# Patient Record
Sex: Male | Born: 1950 | Race: White | Hispanic: No | Marital: Married | Smoking: Former smoker
Health system: Southern US, Community
[De-identification: ages and names within clinical notes are randomized; demographics above are authoritative.]

## PROBLEM LIST (undated history)

## (undated) DIAGNOSIS — M1990 Unspecified osteoarthritis, unspecified site: Secondary | ICD-10-CM

## (undated) DIAGNOSIS — G8929 Other chronic pain: Secondary | ICD-10-CM

## (undated) DIAGNOSIS — M549 Dorsalgia, unspecified: Secondary | ICD-10-CM

## (undated) DIAGNOSIS — G629 Polyneuropathy, unspecified: Secondary | ICD-10-CM

## (undated) DIAGNOSIS — I10 Essential (primary) hypertension: Secondary | ICD-10-CM

## (undated) DIAGNOSIS — E119 Type 2 diabetes mellitus without complications: Secondary | ICD-10-CM

## (undated) HISTORY — PX: PAIN PUMP REVISION: SHX6231

---

## 2004-01-25 ENCOUNTER — Other Ambulatory Visit: Payer: Self-pay

## 2004-05-12 ENCOUNTER — Ambulatory Visit: Payer: Self-pay | Admitting: Gastroenterology

## 2004-06-27 ENCOUNTER — Encounter: Payer: Self-pay | Admitting: Family Medicine

## 2004-07-03 ENCOUNTER — Emergency Department: Payer: Self-pay | Admitting: Emergency Medicine

## 2005-07-30 ENCOUNTER — Ambulatory Visit: Payer: Self-pay | Admitting: Family Medicine

## 2006-10-03 ENCOUNTER — Inpatient Hospital Stay: Payer: Self-pay

## 2006-10-06 ENCOUNTER — Other Ambulatory Visit: Payer: Self-pay

## 2006-10-23 ENCOUNTER — Ambulatory Visit: Payer: Self-pay | Admitting: Family Medicine

## 2007-02-21 ENCOUNTER — Other Ambulatory Visit: Payer: Self-pay

## 2007-02-21 ENCOUNTER — Inpatient Hospital Stay: Payer: Self-pay | Admitting: Psychiatry

## 2007-02-25 ENCOUNTER — Ambulatory Visit: Payer: Self-pay | Admitting: Unknown Physician Specialty

## 2007-02-27 ENCOUNTER — Ambulatory Visit: Payer: Self-pay | Admitting: Unknown Physician Specialty

## 2007-03-29 ENCOUNTER — Ambulatory Visit: Payer: Self-pay | Admitting: Unknown Physician Specialty

## 2007-04-28 ENCOUNTER — Ambulatory Visit: Payer: Self-pay | Admitting: Unknown Physician Specialty

## 2008-01-13 ENCOUNTER — Ambulatory Visit: Payer: Self-pay | Admitting: Podiatry

## 2008-10-13 ENCOUNTER — Inpatient Hospital Stay: Payer: Self-pay | Admitting: Internal Medicine

## 2009-03-17 ENCOUNTER — Inpatient Hospital Stay: Payer: Self-pay | Admitting: Student

## 2009-07-11 ENCOUNTER — Emergency Department: Payer: Self-pay

## 2009-07-17 ENCOUNTER — Inpatient Hospital Stay: Payer: Self-pay | Admitting: *Deleted

## 2010-03-22 ENCOUNTER — Other Ambulatory Visit: Payer: Self-pay | Admitting: Specialist

## 2011-03-05 DIAGNOSIS — K7581 Nonalcoholic steatohepatitis (NASH): Secondary | ICD-10-CM | POA: Insufficient documentation

## 2011-03-27 DIAGNOSIS — Z8679 Personal history of other diseases of the circulatory system: Secondary | ICD-10-CM | POA: Insufficient documentation

## 2011-03-27 DIAGNOSIS — I480 Paroxysmal atrial fibrillation: Secondary | ICD-10-CM | POA: Insufficient documentation

## 2011-06-20 ENCOUNTER — Ambulatory Visit: Payer: Self-pay | Admitting: Physical Medicine and Rehabilitation

## 2011-07-03 DIAGNOSIS — Z95810 Presence of automatic (implantable) cardiac defibrillator: Secondary | ICD-10-CM | POA: Insufficient documentation

## 2012-08-07 ENCOUNTER — Inpatient Hospital Stay: Payer: Self-pay | Admitting: Internal Medicine

## 2012-08-07 LAB — COMPREHENSIVE METABOLIC PANEL
Albumin: 3.4 g/dL (ref 3.4–5.0)
Alkaline Phosphatase: 40 U/L — ABNORMAL LOW (ref 50–136)
Anion Gap: 3 — ABNORMAL LOW (ref 7–16)
Bilirubin,Total: 0.5 mg/dL (ref 0.2–1.0)
Chloride: 99 mmol/L (ref 98–107)
Co2: 28 mmol/L (ref 21–32)
Creatinine: 1.09 mg/dL (ref 0.60–1.30)
EGFR (African American): >60
EGFR (Non-African Amer.): >60
Osmolality: 265 (ref 275–301)
SGOT(AST): 23 U/L (ref 15–37)
SGPT (ALT): 21 U/L (ref 12–78)
Sodium: 130 mmol/L — ABNORMAL LOW (ref 136–145)

## 2012-08-07 LAB — TROPONIN I: Troponin-I: 0.24 ng/mL — ABNORMAL HIGH

## 2012-08-07 LAB — CK TOTAL AND CKMB (NOT AT ARMC)
CK, Total: 41 U/L (ref 35–232)
CK, Total: 46 U/L (ref 35–232)
CK-MB: 1.3 ng/mL (ref 0.5–3.6)

## 2012-08-07 LAB — URINALYSIS, COMPLETE
Bilirubin,UR: NEGATIVE
Glucose,UR: NEGATIVE mg/dL (ref 0–75)
Ketone: NEGATIVE
Leukocyte Esterase: NEGATIVE
Nitrite: NEGATIVE
Protein: NEGATIVE
RBC,UR: <1 /HPF (ref 0–5)

## 2012-08-07 LAB — CBC
MCHC: 32.6 g/dL (ref 32.0–36.0)
RBC: 3.78 10*6/uL — ABNORMAL LOW (ref 4.40–5.90)
RDW: 14.7 % — ABNORMAL HIGH (ref 11.5–14.5)

## 2012-08-07 LAB — PRO B NATRIURETIC PEPTIDE: B-Type Natriuretic Peptide: 2382 pg/mL — ABNORMAL HIGH (ref 0–125)

## 2012-08-08 LAB — LIPID PANEL
HDL Cholesterol: 33 mg/dL — ABNORMAL LOW (ref 40–60)
Ldl Cholesterol, Calc: 37 mg/dL (ref 0–100)
Triglycerides: 173 mg/dL (ref 0–200)
VLDL Cholesterol, Calc: 35 mg/dL (ref 5–40)

## 2012-08-08 LAB — TROPONIN I: Troponin-I: 0.14 ng/mL — ABNORMAL HIGH

## 2012-09-11 DIAGNOSIS — I10 Essential (primary) hypertension: Secondary | ICD-10-CM | POA: Insufficient documentation

## 2013-02-17 DIAGNOSIS — Z955 Presence of coronary angioplasty implant and graft: Secondary | ICD-10-CM | POA: Insufficient documentation

## 2013-04-07 DIAGNOSIS — I255 Ischemic cardiomyopathy: Secondary | ICD-10-CM | POA: Insufficient documentation

## 2013-04-23 ENCOUNTER — Inpatient Hospital Stay: Payer: Self-pay | Admitting: Internal Medicine

## 2013-04-23 LAB — COMPREHENSIVE METABOLIC PANEL
Anion Gap: 3 — ABNORMAL LOW (ref 7–16)
BUN: 38 mg/dL — ABNORMAL HIGH (ref 7–18)
Bilirubin,Total: 0.5 mg/dL (ref 0.2–1.0)
Calcium, Total: 9.3 mg/dL (ref 8.5–10.1)
Co2: 36 mmol/L — ABNORMAL HIGH (ref 21–32)
Osmolality: 283 (ref 275–301)
Potassium: 4.6 mmol/L (ref 3.5–5.1)
SGOT(AST): 172 U/L — ABNORMAL HIGH (ref 15–37)
SGPT (ALT): 87 U/L — ABNORMAL HIGH (ref 12–78)
Total Protein: 7.1 g/dL (ref 6.4–8.2)

## 2013-04-23 LAB — CBC
HCT: 37.7 % — ABNORMAL LOW (ref 40.0–52.0)
HGB: 12.1 g/dL — ABNORMAL LOW (ref 13.0–18.0)
MCH: 29 pg (ref 26.0–34.0)
MCHC: 32.1 g/dL (ref 32.0–36.0)
MCV: 91 fL (ref 80–100)
Platelet: 132 10*3/uL — ABNORMAL LOW (ref 150–440)
RBC: 4.16 10*6/uL — ABNORMAL LOW (ref 4.40–5.90)
RDW: 14.4 % (ref 11.5–14.5)
WBC: 10.2 10*3/uL (ref 3.8–10.6)

## 2013-04-23 LAB — PROTIME-INR
INR: 1.1
Prothrombin Time: 14 secs (ref 11.5–14.7)

## 2013-04-23 LAB — CK-MB
CK-MB: 31 ng/mL — ABNORMAL HIGH (ref 0.5–3.6)
CK-MB: 38.3 ng/mL — ABNORMAL HIGH (ref 0.5–3.6)
CK-MB: 35.2 ng/mL — ABNORMAL HIGH (ref 0.5–3.6)

## 2013-04-23 LAB — URINALYSIS, COMPLETE
Glucose,UR: NEGATIVE mg/dL (ref 0–75)
Hyaline Cast: 21
Ketone: NEGATIVE
Protein: 30
RBC,UR: 1 /HPF (ref 0–5)
WBC UR: 3 /HPF (ref 0–5)

## 2013-04-23 LAB — APTT
Activated PTT: 38.6 secs — ABNORMAL HIGH (ref 23.6–35.9)
Activated PTT: >160 secs (ref 23.6–35.9)

## 2013-04-23 LAB — ETHANOL: Ethanol: <3 mg/dL

## 2013-04-24 LAB — CBC WITH DIFFERENTIAL/PLATELET
Basophil #: 0 10*3/uL (ref 0.0–0.1)
Basophil %: 0.2 %
Eosinophil %: 0 %
HCT: 36.2 % — ABNORMAL LOW (ref 40.0–52.0)
HGB: 12.1 g/dL — ABNORMAL LOW (ref 13.0–18.0)
Lymphocyte #: 0.7 10*3/uL — ABNORMAL LOW (ref 1.0–3.6)
Lymphocyte %: 5.9 %
MCV: 89 fL (ref 80–100)
Monocyte #: 0.2 x10 3/mm (ref 0.2–1.0)
Monocyte %: 2.1 %
Neutrophil #: 10.2 10*3/uL — ABNORMAL HIGH (ref 1.4–6.5)
Neutrophil %: 91.8 %
RDW: 14.4 % (ref 11.5–14.5)
WBC: 11.1 10*3/uL — ABNORMAL HIGH (ref 3.8–10.6)

## 2013-04-24 LAB — COMPREHENSIVE METABOLIC PANEL
Alkaline Phosphatase: 40 U/L — ABNORMAL LOW
Anion Gap: 7 (ref 7–16)
BUN: 37 mg/dL — ABNORMAL HIGH (ref 7–18)
Bilirubin,Total: 0.3 mg/dL (ref 0.2–1.0)
Calcium, Total: 8.8 mg/dL (ref 8.5–10.1)
Co2: 31 mmol/L (ref 21–32)
EGFR (African American): 40 — ABNORMAL LOW
Glucose: 188 mg/dL — ABNORMAL HIGH (ref 65–99)
Osmolality: 286 (ref 275–301)
Potassium: 4.5 mmol/L (ref 3.5–5.1)
SGPT (ALT): 112 U/L — ABNORMAL HIGH (ref 12–78)

## 2013-04-24 LAB — PHOSPHORUS: Phosphorus: 2.4 mg/dL — ABNORMAL LOW (ref 2.5–4.9)

## 2013-04-24 LAB — MAGNESIUM: Magnesium: 2.1 mg/dL

## 2013-04-24 LAB — PROTIME-INR: INR: 1.2

## 2013-04-25 LAB — BASIC METABOLIC PANEL
Anion Gap: 5 — ABNORMAL LOW (ref 7–16)
BUN: 33 mg/dL — ABNORMAL HIGH (ref 7–18)
Chloride: 103 mmol/L (ref 98–107)
Co2: 29 mmol/L (ref 21–32)
Creatinine: 1.33 mg/dL — ABNORMAL HIGH (ref 0.60–1.30)
EGFR (African American): >60
EGFR (Non-African Amer.): 57 — ABNORMAL LOW
Glucose: 181 mg/dL — ABNORMAL HIGH (ref 65–99)
Potassium: 4.7 mmol/L (ref 3.5–5.1)
Sodium: 137 mmol/L (ref 136–145)

## 2013-04-25 LAB — CBC WITH DIFFERENTIAL/PLATELET
Basophil #: 0 10*3/uL (ref 0.0–0.1)
Eosinophil #: 0 10*3/uL (ref 0.0–0.7)
Eosinophil %: 0 %
MCH: 29.6 pg (ref 26.0–34.0)
Monocyte %: 2.7 %
Neutrophil #: 8.3 10*3/uL — ABNORMAL HIGH (ref 1.4–6.5)
Platelet: 148 10*3/uL — ABNORMAL LOW (ref 150–440)
WBC: 9.2 10*3/uL (ref 3.8–10.6)

## 2013-04-25 LAB — PHOSPHORUS: Phosphorus: 2.7 mg/dL (ref 2.5–4.9)

## 2013-04-25 LAB — MAGNESIUM: Magnesium: 2.2 mg/dL

## 2013-04-25 LAB — APTT: Activated PTT: 56 secs — ABNORMAL HIGH (ref 23.6–35.9)

## 2013-04-26 LAB — BASIC METABOLIC PANEL
Chloride: 106 mmol/L (ref 98–107)
Co2: 30 mmol/L (ref 21–32)
EGFR (African American): >60
EGFR (Non-African Amer.): >60
Glucose: 146 mg/dL — ABNORMAL HIGH (ref 65–99)
Potassium: 4.5 mmol/L (ref 3.5–5.1)

## 2013-04-26 LAB — CBC WITH DIFFERENTIAL/PLATELET
Basophil #: 0 10*3/uL (ref 0.0–0.1)
HCT: 35.5 % — ABNORMAL LOW (ref 40.0–52.0)
HGB: 11.9 g/dL — ABNORMAL LOW (ref 13.0–18.0)
Lymphocyte %: 5.9 %
MCH: 30.2 pg (ref 26.0–34.0)
MCV: 90 fL (ref 80–100)
Monocyte #: 0.4 x10 3/mm (ref 0.2–1.0)
Neutrophil %: 89.9 %
RDW: 14.6 % — ABNORMAL HIGH (ref 11.5–14.5)

## 2013-04-26 LAB — PHOSPHORUS: Phosphorus: 2 mg/dL — ABNORMAL LOW (ref 2.5–4.9)

## 2013-04-27 LAB — POTASSIUM: Potassium: 4.7 mmol/L (ref 3.5–5.1)

## 2013-04-27 LAB — PHOSPHORUS: Phosphorus: 2.8 mg/dL (ref 2.5–4.9)

## 2013-04-27 LAB — MAGNESIUM: Magnesium: 1.6 mg/dL — ABNORMAL LOW

## 2013-04-28 LAB — SODIUM: Sodium: 133 mmol/L — ABNORMAL LOW (ref 136–145)

## 2013-04-28 LAB — PHOSPHORUS: Phosphorus: 2.8 mg/dL (ref 2.5–4.9)

## 2013-04-28 LAB — CALCIUM: Calcium, Total: 8.8 mg/dL (ref 8.5–10.1)

## 2013-04-28 LAB — MAGNESIUM: Magnesium: 2.1 mg/dL

## 2013-04-28 LAB — POTASSIUM: Potassium: 4.6 mmol/L (ref 3.5–5.1)

## 2013-07-04 DIAGNOSIS — I38 Endocarditis, valve unspecified: Secondary | ICD-10-CM | POA: Insufficient documentation

## 2013-08-31 ENCOUNTER — Ambulatory Visit: Payer: Self-pay | Admitting: Specialist

## 2013-09-08 ENCOUNTER — Ambulatory Visit: Payer: Self-pay | Admitting: Gastroenterology

## 2013-09-25 DIAGNOSIS — F329 Major depressive disorder, single episode, unspecified: Secondary | ICD-10-CM | POA: Insufficient documentation

## 2013-09-25 DIAGNOSIS — F32A Depression, unspecified: Secondary | ICD-10-CM | POA: Insufficient documentation

## 2013-10-30 DIAGNOSIS — G4733 Obstructive sleep apnea (adult) (pediatric): Secondary | ICD-10-CM | POA: Insufficient documentation

## 2013-10-30 DIAGNOSIS — I493 Ventricular premature depolarization: Secondary | ICD-10-CM | POA: Insufficient documentation

## 2013-10-30 DIAGNOSIS — E781 Pure hyperglyceridemia: Secondary | ICD-10-CM | POA: Insufficient documentation

## 2013-11-04 ENCOUNTER — Inpatient Hospital Stay: Payer: Self-pay | Admitting: Internal Medicine

## 2013-11-04 LAB — PROTIME-INR
INR: 1
PROTHROMBIN TIME: 13.4 s (ref 11.5–14.7)

## 2013-11-04 LAB — PRO B NATRIURETIC PEPTIDE: B-Type Natriuretic Peptide: 8924 pg/mL — ABNORMAL HIGH (ref 0–125)

## 2013-11-04 LAB — CBC
HCT: 34.1 % — ABNORMAL LOW (ref 40.0–52.0)
HGB: 11 g/dL — ABNORMAL LOW (ref 13.0–18.0)
MCH: 29.3 pg (ref 26.0–34.0)
MCHC: 32.2 g/dL (ref 32.0–36.0)
MCV: 91 fL (ref 80–100)
PLATELETS: 141 10*3/uL — AB (ref 150–440)
RBC: 3.74 10*6/uL — ABNORMAL LOW (ref 4.40–5.90)
RDW: 14.9 % — ABNORMAL HIGH (ref 11.5–14.5)
WBC: 8.3 10*3/uL (ref 3.8–10.6)

## 2013-11-04 LAB — COMPREHENSIVE METABOLIC PANEL
Albumin: 3.8 g/dL (ref 3.4–5.0)
Alkaline Phosphatase: 50 U/L
Anion Gap: 3 — ABNORMAL LOW (ref 7–16)
BUN: 38 mg/dL — ABNORMAL HIGH (ref 7–18)
Bilirubin,Total: 0.3 mg/dL (ref 0.2–1.0)
Calcium, Total: 9.4 mg/dL (ref 8.5–10.1)
Chloride: 100 mmol/L (ref 98–107)
Co2: 32 mmol/L (ref 21–32)
Creatinine: 2.11 mg/dL — ABNORMAL HIGH (ref 0.60–1.30)
EGFR (Non-African Amer.): 32 — ABNORMAL LOW
GFR CALC AF AMER: 37 — AB
Glucose: 99 mg/dL (ref 65–99)
Osmolality: 279 (ref 275–301)
Potassium: 4.4 mmol/L (ref 3.5–5.1)
SGOT(AST): 57 U/L — ABNORMAL HIGH (ref 15–37)
SGPT (ALT): 32 U/L (ref 12–78)
Sodium: 135 mmol/L — ABNORMAL LOW (ref 136–145)
Total Protein: 7.2 g/dL (ref 6.4–8.2)

## 2013-11-04 LAB — CK-MB: CK-MB: 6.4 ng/mL — ABNORMAL HIGH (ref 0.5–3.6)

## 2013-11-04 LAB — CK TOTAL AND CKMB (NOT AT ARMC)
CK, TOTAL: 73 U/L
CK-MB: 7.9 ng/mL — ABNORMAL HIGH (ref 0.5–3.6)

## 2013-11-04 LAB — TROPONIN I: TROPONIN-I: 2.07 ng/mL — AB

## 2013-11-04 LAB — APTT: ACTIVATED PTT: 34.6 s (ref 23.6–35.9)

## 2013-11-05 LAB — CBC WITH DIFFERENTIAL/PLATELET
Basophil #: 0 10*3/uL (ref 0.0–0.1)
Basophil %: 0 %
EOS ABS: 0 10*3/uL (ref 0.0–0.7)
Eosinophil %: 0.1 %
HCT: 33.3 % — ABNORMAL LOW (ref 40.0–52.0)
HGB: 10.7 g/dL — AB (ref 13.0–18.0)
LYMPHS PCT: 7 %
Lymphocyte #: 0.4 10*3/uL — ABNORMAL LOW (ref 1.0–3.6)
MCH: 29.3 pg (ref 26.0–34.0)
MCHC: 32.1 g/dL (ref 32.0–36.0)
MCV: 91 fL (ref 80–100)
Monocyte #: 0.1 x10 3/mm — ABNORMAL LOW (ref 0.2–1.0)
Monocyte %: 0.9 %
NEUTROS PCT: 92 %
Neutrophil #: 5.6 10*3/uL (ref 1.4–6.5)
Platelet: 118 10*3/uL — ABNORMAL LOW (ref 150–440)
RBC: 3.65 10*6/uL — AB (ref 4.40–5.90)
RDW: 14.7 % — ABNORMAL HIGH (ref 11.5–14.5)
WBC: 6.1 10*3/uL (ref 3.8–10.6)

## 2013-11-05 LAB — LIPID PANEL
Cholesterol: 127 mg/dL (ref 0–200)
HDL Cholesterol: 33 mg/dL — ABNORMAL LOW (ref 40–60)
LDL CHOLESTEROL, CALC: 74 mg/dL (ref 0–100)
TRIGLYCERIDES: 99 mg/dL (ref 0–200)
VLDL CHOLESTEROL, CALC: 20 mg/dL (ref 5–40)

## 2013-11-05 LAB — BASIC METABOLIC PANEL
Anion Gap: 5 — ABNORMAL LOW (ref 7–16)
BUN: 40 mg/dL — AB (ref 7–18)
CALCIUM: 9.2 mg/dL (ref 8.5–10.1)
CHLORIDE: 101 mmol/L (ref 98–107)
Co2: 34 mmol/L — ABNORMAL HIGH (ref 21–32)
Creatinine: 1.63 mg/dL — ABNORMAL HIGH (ref 0.60–1.30)
EGFR (African American): 51 — ABNORMAL LOW
EGFR (Non-African Amer.): 44 — ABNORMAL LOW
GLUCOSE: 191 mg/dL — AB (ref 65–99)
Osmolality: 294 (ref 275–301)
Potassium: 4.4 mmol/L (ref 3.5–5.1)
SODIUM: 140 mmol/L (ref 136–145)

## 2013-11-05 LAB — APTT
ACTIVATED PTT: 124.8 s — AB (ref 23.6–35.9)
Activated PTT: 90.8 secs — ABNORMAL HIGH (ref 23.6–35.9)

## 2013-11-05 LAB — TROPONIN I: TROPONIN-I: 1.4 ng/mL — AB

## 2013-11-05 LAB — CK-MB: CK-MB: 5.9 ng/mL — ABNORMAL HIGH (ref 0.5–3.6)

## 2013-11-06 LAB — BASIC METABOLIC PANEL
Anion Gap: 2 — ABNORMAL LOW (ref 7–16)
BUN: 30 mg/dL — ABNORMAL HIGH (ref 7–18)
Calcium, Total: 8.8 mg/dL (ref 8.5–10.1)
Chloride: 101 mmol/L (ref 98–107)
Co2: 37 mmol/L — ABNORMAL HIGH (ref 21–32)
Creatinine: 1.13 mg/dL (ref 0.60–1.30)
EGFR (African American): >60
Glucose: 181 mg/dL — ABNORMAL HIGH (ref 65–99)
Osmolality: 290 (ref 275–301)
Potassium: 4.2 mmol/L (ref 3.5–5.1)
Sodium: 140 mmol/L (ref 136–145)

## 2013-11-06 LAB — MAGNESIUM: Magnesium: 2.2 mg/dL

## 2013-11-06 LAB — APTT: Activated PTT: 70.7 secs — ABNORMAL HIGH (ref 23.6–35.9)

## 2013-11-09 LAB — CULTURE, BLOOD (SINGLE)

## 2013-12-10 DIAGNOSIS — M5416 Radiculopathy, lumbar region: Secondary | ICD-10-CM | POA: Insufficient documentation

## 2013-12-10 DIAGNOSIS — M5136 Other intervertebral disc degeneration, lumbar region: Secondary | ICD-10-CM | POA: Insufficient documentation

## 2014-01-04 DIAGNOSIS — Z4502 Encounter for adjustment and management of automatic implantable cardiac defibrillator: Secondary | ICD-10-CM | POA: Insufficient documentation

## 2014-02-04 DIAGNOSIS — I5022 Chronic systolic (congestive) heart failure: Secondary | ICD-10-CM | POA: Insufficient documentation

## 2014-04-19 ENCOUNTER — Ambulatory Visit: Payer: Self-pay | Admitting: Specialist

## 2014-04-27 ENCOUNTER — Ambulatory Visit: Payer: Self-pay | Admitting: Internal Medicine

## 2014-04-30 ENCOUNTER — Inpatient Hospital Stay: Payer: Self-pay | Admitting: Internal Medicine

## 2014-04-30 LAB — CBC
HCT: 38.3 % — AB (ref 40.0–52.0)
HGB: 11.9 g/dL — AB (ref 13.0–18.0)
MCH: 28.1 pg (ref 26.0–34.0)
MCHC: 31 g/dL — ABNORMAL LOW (ref 32.0–36.0)
MCV: 91 fL (ref 80–100)
PLATELETS: 166 10*3/uL (ref 150–440)
RBC: 4.21 10*6/uL — ABNORMAL LOW (ref 4.40–5.90)
RDW: 15.7 % — ABNORMAL HIGH (ref 11.5–14.5)
WBC: 19.8 10*3/uL — ABNORMAL HIGH (ref 3.8–10.6)

## 2014-04-30 LAB — URINALYSIS, COMPLETE
BACTERIA: NONE SEEN
BILIRUBIN, UR: NEGATIVE
Glucose,UR: NEGATIVE mg/dL (ref 0–75)
Ketone: NEGATIVE
LEUKOCYTE ESTERASE: NEGATIVE
Nitrite: NEGATIVE
Ph: 5 (ref 4.5–8.0)
SQUAMOUS EPITHELIAL: NONE SEEN
Specific Gravity: 1.015 (ref 1.003–1.030)
WBC UR: NONE SEEN /HPF (ref 0–5)

## 2014-04-30 LAB — COMPREHENSIVE METABOLIC PANEL
ALBUMIN: 3.3 g/dL — AB (ref 3.4–5.0)
Alkaline Phosphatase: 87 U/L
Anion Gap: 11 (ref 7–16)
BUN: 39 mg/dL — AB (ref 7–18)
Bilirubin,Total: 3.2 mg/dL — ABNORMAL HIGH (ref 0.2–1.0)
CHLORIDE: 94 mmol/L — AB (ref 98–107)
CREATININE: 3.33 mg/dL — AB (ref 0.60–1.30)
Calcium, Total: 8.4 mg/dL — ABNORMAL LOW (ref 8.5–10.1)
Co2: 32 mmol/L (ref 21–32)
EGFR (Non-African Amer.): 20 — ABNORMAL LOW
GFR CALC AF AMER: 24 — AB
GLUCOSE: 144 mg/dL — AB (ref 65–99)
OSMOLALITY: 286 (ref 275–301)
Potassium: 5.9 mmol/L — ABNORMAL HIGH (ref 3.5–5.1)
SGOT(AST): >2000 U/L — ABNORMAL HIGH (ref 15–37)
SGPT (ALT): 3205 U/L — ABNORMAL HIGH
SODIUM: 137 mmol/L (ref 136–145)
TOTAL PROTEIN: 6.9 g/dL (ref 6.4–8.2)

## 2014-04-30 LAB — ACETAMINOPHEN LEVEL
Acetaminophen: <2 ug/mL
Acetaminophen: <2 ug/mL

## 2014-04-30 LAB — PROTIME-INR
INR: 2
Prothrombin Time: 22.6 secs — ABNORMAL HIGH (ref 11.5–14.7)

## 2014-04-30 LAB — SALICYLATE LEVEL: Salicylates, Serum: <1.7 mg/dL

## 2014-04-30 LAB — CK-MB
CK-MB: 5.6 ng/mL — AB (ref 0.5–3.6)
CK-MB: 9.3 ng/mL — AB (ref 0.5–3.6)
CK-MB: 14.2 ng/mL — ABNORMAL HIGH (ref 0.5–3.6)

## 2014-04-30 LAB — APTT: ACTIVATED PTT: 35.4 s (ref 23.6–35.9)

## 2014-04-30 LAB — TROPONIN I: Troponin-I: 1.1 ng/mL — ABNORMAL HIGH

## 2014-04-30 LAB — HEPARIN LEVEL (UNFRACTIONATED)

## 2014-05-01 LAB — BASIC METABOLIC PANEL
Anion Gap: 8 (ref 7–16)
BUN: 75 mg/dL — ABNORMAL HIGH (ref 7–18)
CHLORIDE: 97 mmol/L — AB (ref 98–107)
CO2: 31 mmol/L (ref 21–32)
CREATININE: 4.17 mg/dL — AB (ref 0.60–1.30)
Calcium, Total: 7.7 mg/dL — ABNORMAL LOW (ref 8.5–10.1)
EGFR (African American): 19 — ABNORMAL LOW
EGFR (Non-African Amer.): 15 — ABNORMAL LOW
GLUCOSE: 167 mg/dL — AB (ref 65–99)
OSMOLALITY: 298 (ref 275–301)
POTASSIUM: 5 mmol/L (ref 3.5–5.1)
SODIUM: 136 mmol/L (ref 136–145)

## 2014-05-01 LAB — CBC WITH DIFFERENTIAL/PLATELET
Basophil #: 0 10*3/uL (ref 0.0–0.1)
Basophil %: 0.1 %
EOS ABS: 0 10*3/uL (ref 0.0–0.7)
Eosinophil %: 0 %
HCT: 38 % — ABNORMAL LOW (ref 40.0–52.0)
HGB: 11.7 g/dL — AB (ref 13.0–18.0)
LYMPHS ABS: 0.2 10*3/uL — AB (ref 1.0–3.6)
Lymphocyte %: 1.3 %
MCH: 27.4 pg (ref 26.0–34.0)
MCHC: 30.8 g/dL — AB (ref 32.0–36.0)
MCV: 89 fL (ref 80–100)
MONO ABS: 0.2 x10 3/mm (ref 0.2–1.0)
Monocyte %: 1.2 %
NEUTROS PCT: 97.4 %
Neutrophil #: 17 10*3/uL — ABNORMAL HIGH (ref 1.4–6.5)
PLATELETS: 139 10*3/uL — AB (ref 150–440)
RBC: 4.27 10*6/uL — ABNORMAL LOW (ref 4.40–5.90)
RDW: 15.6 % — ABNORMAL HIGH (ref 11.5–14.5)
WBC: 17.5 10*3/uL — ABNORMAL HIGH (ref 3.8–10.6)

## 2014-05-01 LAB — COMPREHENSIVE METABOLIC PANEL
ALBUMIN: 2.9 g/dL — AB (ref 3.4–5.0)
ALK PHOS: 107 U/L
BILIRUBIN TOTAL: 1.5 mg/dL — AB (ref 0.2–1.0)
SGOT(AST): >2000 U/L — ABNORMAL HIGH (ref 15–37)
SGPT (ALT): >4000 U/L — ABNORMAL HIGH
Total Protein: 6.4 g/dL (ref 6.4–8.2)

## 2014-05-01 LAB — HEPARIN LEVEL (UNFRACTIONATED)

## 2014-05-01 LAB — PROTIME-INR
INR: 1.9
Prothrombin Time: 21.1 secs — ABNORMAL HIGH (ref 11.5–14.7)

## 2014-05-02 LAB — BASIC METABOLIC PANEL
ANION GAP: 7 (ref 7–16)
ANION GAP: 12 (ref 7–16)
ANION GAP: 13 (ref 7–16)
Anion Gap: 13 (ref 7–16)
Anion Gap: 12 (ref 7–16)
BUN: 115 mg/dL — AB (ref 7–18)
BUN: 112 mg/dL — AB (ref 7–18)
BUN: 106 mg/dL — AB (ref 7–18)
BUN: 98 mg/dL — ABNORMAL HIGH (ref 7–18)
BUN: 100 mg/dL — AB (ref 7–18)
CALCIUM: 6.6 mg/dL — AB (ref 8.5–10.1)
CALCIUM: 7.1 mg/dL — AB (ref 8.5–10.1)
CHLORIDE: 97 mmol/L — AB (ref 98–107)
CHLORIDE: 100 mmol/L (ref 98–107)
CHLORIDE: 101 mmol/L (ref 98–107)
CO2: 25 mmol/L (ref 21–32)
CO2: 26 mmol/L (ref 21–32)
CO2: 25 mmol/L (ref 21–32)
CREATININE: 5.94 mg/dL — AB (ref 0.60–1.30)
CREATININE: 5.81 mg/dL — AB (ref 0.60–1.30)
CREATININE: 5.49 mg/dL — AB (ref 0.60–1.30)
Calcium, Total: 7 mg/dL — CL (ref 8.5–10.1)
Calcium, Total: 7.1 mg/dL — ABNORMAL LOW (ref 8.5–10.1)
Calcium, Total: 7 mg/dL — CL (ref 8.5–10.1)
Chloride: 102 mmol/L (ref 98–107)
Chloride: 100 mmol/L (ref 98–107)
Co2: 25 mmol/L (ref 21–32)
Co2: 26 mmol/L (ref 21–32)
Creatinine: 5.99 mg/dL — ABNORMAL HIGH (ref 0.60–1.30)
Creatinine: 5.37 mg/dL — ABNORMAL HIGH (ref 0.60–1.30)
EGFR (African American): 12 — ABNORMAL LOW
EGFR (African American): 12 — ABNORMAL LOW
EGFR (African American): 14 — ABNORMAL LOW
EGFR (Non-African Amer.): 10 — ABNORMAL LOW
EGFR (Non-African Amer.): 11 — ABNORMAL LOW
EGFR (Non-African Amer.): 12 — ABNORMAL LOW
GFR CALC AF AMER: 13 — AB
GFR CALC AF AMER: 14 — AB
GFR CALC NON AF AMER: 10 — AB
GFR CALC NON AF AMER: 11 — AB
GLUCOSE: 170 mg/dL — AB (ref 65–99)
GLUCOSE: 165 mg/dL — AB (ref 65–99)
Glucose: 156 mg/dL — ABNORMAL HIGH (ref 65–99)
Glucose: 166 mg/dL — ABNORMAL HIGH (ref 65–99)
Glucose: 167 mg/dL — ABNORMAL HIGH (ref 65–99)
OSMOLALITY: 307 (ref 275–301)
Osmolality: 311 (ref 275–301)
Osmolality: 313 (ref 275–301)
Osmolality: 310 (ref 275–301)
Osmolality: 313 (ref 275–301)
POTASSIUM: 4.5 mmol/L (ref 3.5–5.1)
POTASSIUM: 4.6 mmol/L (ref 3.5–5.1)
POTASSIUM: 4.6 mmol/L (ref 3.5–5.1)
POTASSIUM: 4.6 mmol/L (ref 3.5–5.1)
Potassium: 4.4 mmol/L (ref 3.5–5.1)
SODIUM: 138 mmol/L (ref 136–145)
SODIUM: 139 mmol/L (ref 136–145)
Sodium: 135 mmol/L — ABNORMAL LOW (ref 136–145)
Sodium: 134 mmol/L — ABNORMAL LOW (ref 136–145)
Sodium: 138 mmol/L (ref 136–145)

## 2014-05-02 LAB — CBC WITH DIFFERENTIAL/PLATELET
BASOS PCT: 0.1 %
Basophil #: 0 10*3/uL (ref 0.0–0.1)
Eosinophil #: 0 10*3/uL (ref 0.0–0.7)
Eosinophil %: 0.1 %
HCT: 38.9 % — ABNORMAL LOW (ref 40.0–52.0)
HGB: 12.5 g/dL — ABNORMAL LOW (ref 13.0–18.0)
LYMPHS ABS: 0.7 10*3/uL — AB (ref 1.0–3.6)
Lymphocyte %: 3.5 %
MCH: 28.2 pg (ref 26.0–34.0)
MCHC: 32.1 g/dL (ref 32.0–36.0)
MCV: 88 fL (ref 80–100)
MONO ABS: 1.1 x10 3/mm — AB (ref 0.2–1.0)
MONOS PCT: 5.4 %
NEUTROS PCT: 90.9 %
Neutrophil #: 19 10*3/uL — ABNORMAL HIGH (ref 1.4–6.5)
Platelet: 205 10*3/uL (ref 150–440)
RBC: 4.44 10*6/uL (ref 4.40–5.90)
RDW: 16.1 % — ABNORMAL HIGH (ref 11.5–14.5)
WBC: 20.9 10*3/uL — ABNORMAL HIGH (ref 3.8–10.6)

## 2014-05-02 LAB — AMMONIA: Ammonia, Plasma: 64 mcmol/L — ABNORMAL HIGH (ref 11–32)

## 2014-05-02 LAB — PROTIME-INR
INR: 1.7
Prothrombin Time: 19.4 secs — ABNORMAL HIGH (ref 11.5–14.7)

## 2014-05-02 LAB — HEPATIC FUNCTION PANEL A (ARMC)
Albumin: 2.6 g/dL — ABNORMAL LOW (ref 3.4–5.0)
Alkaline Phosphatase: 112 U/L
Bilirubin, Direct: 0.7 mg/dL — ABNORMAL HIGH (ref 0.0–0.2)
Bilirubin,Total: 1.2 mg/dL — ABNORMAL HIGH (ref 0.2–1.0)
SGOT(AST): >2000 U/L — ABNORMAL HIGH (ref 15–37)
SGPT (ALT): >4000 U/L — ABNORMAL HIGH
TOTAL PROTEIN: 5.7 g/dL — AB (ref 6.4–8.2)

## 2014-05-02 LAB — URIC ACID: Uric Acid: 11.7 mg/dL — ABNORMAL HIGH (ref 3.5–7.2)

## 2014-05-02 LAB — HEPARIN LEVEL (UNFRACTIONATED)
Anti-Xa(Unfractionated): 0.25 IU/mL — ABNORMAL LOW (ref 0.30–0.70)
Anti-Xa(Unfractionated): 0.58 IU/mL (ref 0.30–0.70)

## 2014-05-02 LAB — PHOSPHORUS: PHOSPHORUS: 5.4 mg/dL — AB (ref 2.5–4.9)

## 2014-05-02 LAB — CREATININE, SERUM
Creatinine: 6.05 mg/dL — ABNORMAL HIGH (ref 0.60–1.30)
EGFR (African American): 12 — ABNORMAL LOW
EGFR (Non-African Amer.): 10 — ABNORMAL LOW

## 2014-05-02 LAB — MAGNESIUM: Magnesium: 2.7 mg/dL — ABNORMAL HIGH

## 2014-05-03 LAB — BASIC METABOLIC PANEL
Anion Gap: 11 (ref 7–16)
Anion Gap: 11 (ref 7–16)
Anion Gap: 11 (ref 7–16)
Anion Gap: 9 (ref 7–16)
Anion Gap: 9 (ref 7–16)
BUN: 91 mg/dL — ABNORMAL HIGH (ref 7–18)
BUN: 71 mg/dL — ABNORMAL HIGH (ref 7–18)
BUN: 67 mg/dL — ABNORMAL HIGH (ref 7–18)
BUN: 62 mg/dL — ABNORMAL HIGH (ref 7–18)
BUN: 55 mg/dL — ABNORMAL HIGH (ref 7–18)
CALCIUM: 7.5 mg/dL — AB (ref 8.5–10.1)
CALCIUM: 7.7 mg/dL — AB (ref 8.5–10.1)
CALCIUM: 7.1 mg/dL — AB (ref 8.5–10.1)
CHLORIDE: 103 mmol/L (ref 98–107)
CHLORIDE: 102 mmol/L (ref 98–107)
CO2: 28 mmol/L (ref 21–32)
CO2: 28 mmol/L (ref 21–32)
CREATININE: 3.83 mg/dL — AB (ref 0.60–1.30)
CREATININE: 3.63 mg/dL — AB (ref 0.60–1.30)
Calcium, Total: 7.4 mg/dL — ABNORMAL LOW (ref 8.5–10.1)
Calcium, Total: 7.4 mg/dL — ABNORMAL LOW (ref 8.5–10.1)
Chloride: 101 mmol/L (ref 98–107)
Chloride: 101 mmol/L (ref 98–107)
Chloride: 102 mmol/L (ref 98–107)
Co2: 27 mmol/L (ref 21–32)
Co2: 28 mmol/L (ref 21–32)
Co2: 30 mmol/L (ref 21–32)
Creatinine: 4.94 mg/dL — ABNORMAL HIGH (ref 0.60–1.30)
Creatinine: 3.39 mg/dL — ABNORMAL HIGH (ref 0.60–1.30)
Creatinine: 2.98 mg/dL — ABNORMAL HIGH (ref 0.60–1.30)
EGFR (African American): 24 — ABNORMAL LOW
EGFR (Non-African Amer.): 13 — ABNORMAL LOW
EGFR (Non-African Amer.): 17 — ABNORMAL LOW
EGFR (Non-African Amer.): 18 — ABNORMAL LOW
EGFR (Non-African Amer.): 20 — ABNORMAL LOW
EGFR (Non-African Amer.): 23 — ABNORMAL LOW
GFR CALC AF AMER: 15 — AB
GFR CALC AF AMER: 21 — AB
GFR CALC AF AMER: 22 — AB
GFR CALC AF AMER: 28 — AB
GLUCOSE: 164 mg/dL — AB (ref 65–99)
GLUCOSE: 170 mg/dL — AB (ref 65–99)
GLUCOSE: 138 mg/dL — AB (ref 65–99)
Glucose: 173 mg/dL — ABNORMAL HIGH (ref 65–99)
Glucose: 160 mg/dL — ABNORMAL HIGH (ref 65–99)
OSMOLALITY: 299 (ref 275–301)
OSMOLALITY: 299 (ref 275–301)
OSMOLALITY: 310 (ref 275–301)
Osmolality: 308 (ref 275–301)
Osmolality: 303 (ref 275–301)
POTASSIUM: 4.7 mmol/L (ref 3.5–5.1)
POTASSIUM: 4.9 mmol/L (ref 3.5–5.1)
POTASSIUM: 5 mmol/L (ref 3.5–5.1)
Potassium: 4.5 mmol/L (ref 3.5–5.1)
Potassium: 5.2 mmol/L — ABNORMAL HIGH (ref 3.5–5.1)
Sodium: 139 mmol/L (ref 136–145)
Sodium: 142 mmol/L (ref 136–145)
Sodium: 140 mmol/L (ref 136–145)
Sodium: 139 mmol/L (ref 136–145)
Sodium: 141 mmol/L (ref 136–145)

## 2014-05-03 LAB — CBC WITH DIFFERENTIAL/PLATELET
BASOS ABS: 0 10*3/uL (ref 0.0–0.1)
BASOS PCT: 0.2 %
EOS PCT: 0.1 %
Eosinophil #: 0 10*3/uL (ref 0.0–0.7)
HCT: 33.2 % — ABNORMAL LOW (ref 40.0–52.0)
HGB: 10.7 g/dL — AB (ref 13.0–18.0)
LYMPHS PCT: 4.3 %
Lymphocyte #: 0.4 10*3/uL — ABNORMAL LOW (ref 1.0–3.6)
MCH: 28.1 pg (ref 26.0–34.0)
MCHC: 32.3 g/dL (ref 32.0–36.0)
MCV: 87 fL (ref 80–100)
Monocyte #: 0.2 x10 3/mm (ref 0.2–1.0)
Monocyte %: 2.7 %
NEUTROS ABS: 8.6 10*3/uL — AB (ref 1.4–6.5)
Neutrophil %: 92.7 %
Platelet: 140 10*3/uL — ABNORMAL LOW (ref 150–440)
RBC: 3.82 10*6/uL — ABNORMAL LOW (ref 4.40–5.90)
RDW: 16 % — ABNORMAL HIGH (ref 11.5–14.5)
WBC: 9.3 10*3/uL (ref 3.8–10.6)

## 2014-05-03 LAB — COMPREHENSIVE METABOLIC PANEL
ANION GAP: 12 (ref 7–16)
Albumin: 2.5 g/dL — ABNORMAL LOW (ref 3.4–5.0)
Alkaline Phosphatase: 102 U/L
BUN: 83 mg/dL — AB (ref 7–18)
Bilirubin,Total: 1.1 mg/dL — ABNORMAL HIGH (ref 0.2–1.0)
CALCIUM: 7.1 mg/dL — AB (ref 8.5–10.1)
CHLORIDE: 102 mmol/L (ref 98–107)
CO2: 27 mmol/L (ref 21–32)
CREATININE: 4.37 mg/dL — AB (ref 0.60–1.30)
GFR CALC AF AMER: 18 — AB
GFR CALC NON AF AMER: 15 — AB
Glucose: 167 mg/dL — ABNORMAL HIGH (ref 65–99)
Osmolality: 310 (ref 275–301)
Potassium: 4.6 mmol/L (ref 3.5–5.1)
SGOT(AST): >2000 U/L — ABNORMAL HIGH (ref 15–37)
SGPT (ALT): >4000 U/L — ABNORMAL HIGH
Sodium: 141 mmol/L (ref 136–145)
Total Protein: 5.4 g/dL — ABNORMAL LOW (ref 6.4–8.2)

## 2014-05-03 LAB — PROTIME-INR
INR: 1.4
Prothrombin Time: 17 secs — ABNORMAL HIGH (ref 11.5–14.7)

## 2014-05-03 LAB — HEPARIN LEVEL (UNFRACTIONATED): Anti-Xa(Unfractionated): <0.1 IU/mL — ABNORMAL LOW (ref 0.30–0.70)

## 2014-05-04 LAB — BASIC METABOLIC PANEL
ANION GAP: 4 — AB (ref 7–16)
ANION GAP: 8 (ref 7–16)
ANION GAP: 5 — AB (ref 7–16)
ANION GAP: 10 (ref 7–16)
Anion Gap: 3 — ABNORMAL LOW (ref 7–16)
BUN: 42 mg/dL — AB (ref 7–18)
BUN: 43 mg/dL — AB (ref 7–18)
BUN: 45 mg/dL — ABNORMAL HIGH (ref 7–18)
BUN: 51 mg/dL — AB (ref 7–18)
BUN: 40 mg/dL — ABNORMAL HIGH (ref 7–18)
CALCIUM: 8.1 mg/dL — AB (ref 8.5–10.1)
CALCIUM: 8 mg/dL — AB (ref 8.5–10.1)
CHLORIDE: 106 mmol/L (ref 98–107)
CHLORIDE: 104 mmol/L (ref 98–107)
CO2: 30 mmol/L (ref 21–32)
CO2: 31 mmol/L (ref 21–32)
CO2: 28 mmol/L (ref 21–32)
CREATININE: 2.52 mg/dL — AB (ref 0.60–1.30)
Calcium, Total: 7.6 mg/dL — ABNORMAL LOW (ref 8.5–10.1)
Calcium, Total: 7.9 mg/dL — ABNORMAL LOW (ref 8.5–10.1)
Calcium, Total: 7.9 mg/dL — ABNORMAL LOW (ref 8.5–10.1)
Chloride: 104 mmol/L (ref 98–107)
Chloride: 104 mmol/L (ref 98–107)
Chloride: 106 mmol/L (ref 98–107)
Co2: 30 mmol/L (ref 21–32)
Co2: 30 mmol/L (ref 21–32)
Creatinine: 2.36 mg/dL — ABNORMAL HIGH (ref 0.60–1.30)
Creatinine: 2.55 mg/dL — ABNORMAL HIGH (ref 0.60–1.30)
Creatinine: 2.64 mg/dL — ABNORMAL HIGH (ref 0.60–1.30)
Creatinine: 2.84 mg/dL — ABNORMAL HIGH (ref 0.60–1.30)
EGFR (African American): 29 — ABNORMAL LOW
EGFR (African American): 32 — ABNORMAL LOW
EGFR (African American): 33 — ABNORMAL LOW
EGFR (African American): 33 — ABNORMAL LOW
EGFR (Non-African Amer.): 24 — ABNORMAL LOW
EGFR (Non-African Amer.): 27 — ABNORMAL LOW
EGFR (Non-African Amer.): 30 — ABNORMAL LOW
GFR CALC AF AMER: 36 — AB
GFR CALC NON AF AMER: 26 — AB
GFR CALC NON AF AMER: 28 — AB
GLUCOSE: 142 mg/dL — AB (ref 65–99)
GLUCOSE: 145 mg/dL — AB (ref 65–99)
GLUCOSE: 175 mg/dL — AB (ref 65–99)
GLUCOSE: 174 mg/dL — AB (ref 65–99)
GLUCOSE: 153 mg/dL — AB (ref 65–99)
Osmolality: 292 (ref 275–301)
Osmolality: 293 (ref 275–301)
Osmolality: 293 (ref 275–301)
Osmolality: 298 (ref 275–301)
Osmolality: 300 (ref 275–301)
POTASSIUM: 5 mmol/L (ref 3.5–5.1)
Potassium: 4.6 mmol/L (ref 3.5–5.1)
Potassium: 4.9 mmol/L (ref 3.5–5.1)
Potassium: 5 mmol/L (ref 3.5–5.1)
Potassium: 5.1 mmol/L (ref 3.5–5.1)
Sodium: 139 mmol/L (ref 136–145)
Sodium: 140 mmol/L (ref 136–145)
Sodium: 140 mmol/L (ref 136–145)
Sodium: 142 mmol/L (ref 136–145)
Sodium: 142 mmol/L (ref 136–145)

## 2014-05-04 LAB — COMPREHENSIVE METABOLIC PANEL
ALBUMIN: 2.6 g/dL — AB (ref 3.4–5.0)
Alkaline Phosphatase: 101 U/L
Anion Gap: 4 — ABNORMAL LOW (ref 7–16)
BUN: 48 mg/dL — ABNORMAL HIGH (ref 7–18)
Bilirubin,Total: 1.2 mg/dL — ABNORMAL HIGH (ref 0.2–1.0)
CALCIUM: 8.1 mg/dL — AB (ref 8.5–10.1)
CHLORIDE: 105 mmol/L (ref 98–107)
CREATININE: 2.59 mg/dL — AB (ref 0.60–1.30)
Co2: 30 mmol/L (ref 21–32)
GFR CALC AF AMER: 32 — AB
GFR CALC NON AF AMER: 27 — AB
Glucose: 156 mg/dL — ABNORMAL HIGH (ref 65–99)
Osmolality: 293 (ref 275–301)
Potassium: 5.3 mmol/L — ABNORMAL HIGH (ref 3.5–5.1)
SGOT(AST): 1265 U/L — ABNORMAL HIGH (ref 15–37)
Sodium: 139 mmol/L (ref 136–145)
Total Protein: 5.6 g/dL — ABNORMAL LOW (ref 6.4–8.2)

## 2014-05-04 LAB — CBC WITH DIFFERENTIAL/PLATELET
BASOS ABS: 0 10*3/uL (ref 0.0–0.1)
BASOS PCT: 0.4 %
EOS ABS: 0 10*3/uL (ref 0.0–0.7)
EOS PCT: 0 %
HCT: 33.3 % — ABNORMAL LOW (ref 40.0–52.0)
HGB: 10.5 g/dL — ABNORMAL LOW (ref 13.0–18.0)
Lymphocyte #: 0.4 10*3/uL — ABNORMAL LOW (ref 1.0–3.6)
Lymphocyte %: 5.4 %
MCH: 28 pg (ref 26.0–34.0)
MCHC: 31.5 g/dL — ABNORMAL LOW (ref 32.0–36.0)
MCV: 89 fL (ref 80–100)
MONOS PCT: 4.9 %
Monocyte #: 0.4 x10 3/mm (ref 0.2–1.0)
Neutrophil #: 7.2 10*3/uL — ABNORMAL HIGH (ref 1.4–6.5)
Neutrophil %: 89.3 %
PLATELETS: 134 10*3/uL — AB (ref 150–440)
RBC: 3.74 10*6/uL — AB (ref 4.40–5.90)
RDW: 16.2 % — ABNORMAL HIGH (ref 11.5–14.5)
WBC: 8.1 10*3/uL (ref 3.8–10.6)

## 2014-05-04 LAB — MAGNESIUM: Magnesium: 2.1 mg/dL

## 2014-05-05 LAB — BASIC METABOLIC PANEL
ANION GAP: 5 — AB (ref 7–16)
ANION GAP: 6 — AB (ref 7–16)
Anion Gap: 5 — ABNORMAL LOW (ref 7–16)
Anion Gap: 7 (ref 7–16)
Anion Gap: 9 (ref 7–16)
BUN: 34 mg/dL — AB (ref 7–18)
BUN: 35 mg/dL — ABNORMAL HIGH (ref 7–18)
BUN: 37 mg/dL — AB (ref 7–18)
BUN: 37 mg/dL — ABNORMAL HIGH (ref 7–18)
BUN: 38 mg/dL — ABNORMAL HIGH (ref 7–18)
CHLORIDE: 104 mmol/L (ref 98–107)
CO2: 29 mmol/L (ref 21–32)
Calcium, Total: 8 mg/dL — ABNORMAL LOW (ref 8.5–10.1)
Calcium, Total: 8.1 mg/dL — ABNORMAL LOW (ref 8.5–10.1)
Calcium, Total: 8.2 mg/dL — ABNORMAL LOW (ref 8.5–10.1)
Calcium, Total: 8.2 mg/dL — ABNORMAL LOW (ref 8.5–10.1)
Calcium, Total: 8.3 mg/dL — ABNORMAL LOW (ref 8.5–10.1)
Chloride: 103 mmol/L (ref 98–107)
Chloride: 106 mmol/L (ref 98–107)
Chloride: 106 mmol/L (ref 98–107)
Chloride: 107 mmol/L (ref 98–107)
Co2: 29 mmol/L (ref 21–32)
Co2: 30 mmol/L (ref 21–32)
Co2: 30 mmol/L (ref 21–32)
Co2: 30 mmol/L (ref 21–32)
Creatinine: 1.9 mg/dL — ABNORMAL HIGH (ref 0.60–1.30)
Creatinine: 1.98 mg/dL — ABNORMAL HIGH (ref 0.60–1.30)
Creatinine: 2.23 mg/dL — ABNORMAL HIGH (ref 0.60–1.30)
Creatinine: 2.28 mg/dL — ABNORMAL HIGH (ref 0.60–1.30)
Creatinine: 2.31 mg/dL — ABNORMAL HIGH (ref 0.60–1.30)
EGFR (African American): 37 — ABNORMAL LOW
EGFR (African American): 38 — ABNORMAL LOW
EGFR (African American): 39 — ABNORMAL LOW
EGFR (African American): 44 — ABNORMAL LOW
EGFR (African American): 46 — ABNORMAL LOW
EGFR (Non-African Amer.): 31 — ABNORMAL LOW
EGFR (Non-African Amer.): 31 — ABNORMAL LOW
EGFR (Non-African Amer.): 32 — ABNORMAL LOW
EGFR (Non-African Amer.): 36 — ABNORMAL LOW
GFR CALC NON AF AMER: 38 — AB
GLUCOSE: 133 mg/dL — AB (ref 65–99)
GLUCOSE: 179 mg/dL — AB (ref 65–99)
Glucose: 139 mg/dL — ABNORMAL HIGH (ref 65–99)
Glucose: 151 mg/dL — ABNORMAL HIGH (ref 65–99)
Glucose: 172 mg/dL — ABNORMAL HIGH (ref 65–99)
OSMOLALITY: 290 (ref 275–301)
Osmolality: 291 (ref 275–301)
Osmolality: 292 (ref 275–301)
Osmolality: 295 (ref 275–301)
Osmolality: 297 (ref 275–301)
POTASSIUM: 4.1 mmol/L (ref 3.5–5.1)
Potassium: 4.4 mmol/L (ref 3.5–5.1)
Potassium: 4.4 mmol/L (ref 3.5–5.1)
Potassium: 4.6 mmol/L (ref 3.5–5.1)
Potassium: 4.9 mmol/L (ref 3.5–5.1)
SODIUM: 140 mmol/L (ref 136–145)
SODIUM: 142 mmol/L (ref 136–145)
Sodium: 141 mmol/L (ref 136–145)
Sodium: 140 mmol/L (ref 136–145)
Sodium: 143 mmol/L (ref 136–145)

## 2014-05-05 LAB — HEPATIC FUNCTION PANEL A (ARMC)
AST: 380 U/L — AB (ref 15–37)
Albumin: 2.6 g/dL — ABNORMAL LOW (ref 3.4–5.0)
Alkaline Phosphatase: 92 U/L
BILIRUBIN TOTAL: 1.4 mg/dL — AB (ref 0.2–1.0)
Bilirubin, Direct: 1 mg/dL — ABNORMAL HIGH (ref 0.0–0.2)
SGPT (ALT): 2608 U/L — ABNORMAL HIGH
TOTAL PROTEIN: 5.6 g/dL — AB (ref 6.4–8.2)

## 2014-05-05 LAB — CULTURE, BLOOD (SINGLE)

## 2014-05-05 LAB — AMMONIA: Ammonia, Plasma: 60 mcmol/L — ABNORMAL HIGH (ref 11–32)

## 2014-05-06 LAB — BASIC METABOLIC PANEL
ANION GAP: 3 — AB (ref 7–16)
BUN: 35 mg/dL — AB (ref 7–18)
CHLORIDE: 108 mmol/L — AB (ref 98–107)
CREATININE: 1.94 mg/dL — AB (ref 0.60–1.30)
Calcium, Total: 8.3 mg/dL — ABNORMAL LOW (ref 8.5–10.1)
Co2: 33 mmol/L — ABNORMAL HIGH (ref 21–32)
EGFR (Non-African Amer.): 37 — ABNORMAL LOW
GFR CALC AF AMER: 45 — AB
Glucose: 143 mg/dL — ABNORMAL HIGH (ref 65–99)
Osmolality: 297 (ref 275–301)
Potassium: 4 mmol/L (ref 3.5–5.1)
Sodium: 144 mmol/L (ref 136–145)

## 2014-05-06 LAB — CBC WITH DIFFERENTIAL/PLATELET
BASOS PCT: 0.6 %
Basophil #: 0 10*3/uL (ref 0.0–0.1)
Basophil #: 0 x10 3/mm 3 (ref 0.0–0.1)
Basophil %: 0.3 %
EOS ABS: 0 10*3/uL (ref 0.0–0.7)
Eosinophil #: 0 x10 3/mm 3 (ref 0.0–0.7)
Eosinophil %: 0 %
Eosinophil %: 0.2 %
HCT: 34.3 % — AB (ref 40.0–52.0)
HCT: 33.3 % — ABNORMAL LOW (ref 40.0–52.0)
HGB: 10.6 g/dL — AB (ref 13.0–18.0)
HGB: 10.4 g/dL — ABNORMAL LOW (ref 13.0–18.0)
Lymphocyte #: 0.3 10*3/uL — ABNORMAL LOW (ref 1.0–3.6)
Lymphocyte %: 3.4 %
Lymphocyte %: 4.7 %
Lymphs Abs: 0.4 x10 3/mm 3 — ABNORMAL LOW (ref 1.0–3.6)
MCH: 27.6 pg (ref 26.0–34.0)
MCH: 27.7 pg (ref 26.0–34.0)
MCHC: 31 g/dL — ABNORMAL LOW (ref 32.0–36.0)
MCHC: 31.2 g/dL — ABNORMAL LOW (ref 32.0–36.0)
MCV: 89 fL (ref 80–100)
MCV: 89 fL (ref 80–100)
MONOS PCT: 5.2 %
Monocyte #: 0.4 x10 3/mm (ref 0.2–1.0)
Monocyte #: 0.4 x10 3/mm (ref 0.2–1.0)
Monocyte %: 4.9 %
NEUTROS ABS: 6.9 10*3/uL — AB (ref 1.4–6.5)
Neutrophil #: 8.2 x10 3/mm 3 — ABNORMAL HIGH (ref 1.4–6.5)
Neutrophil %: 90.8 %
Neutrophil %: 89.9 %
Platelet: 96 10*3/uL — ABNORMAL LOW (ref 150–440)
Platelet: 96 x10 3/mm 3 — ABNORMAL LOW (ref 150–440)
RBC: 3.85 10*6/uL — ABNORMAL LOW (ref 4.40–5.90)
RBC: 3.74 x10 6/mm 3 — ABNORMAL LOW (ref 4.40–5.90)
RDW: 15.9 % — ABNORMAL HIGH (ref 11.5–14.5)
RDW: 16.3 % — ABNORMAL HIGH (ref 11.5–14.5)
WBC: 7.5 10*3/uL (ref 3.8–10.6)
WBC: 9.1 x10 3/mm 3 (ref 3.8–10.6)

## 2014-05-06 LAB — HEPATIC FUNCTION PANEL A (ARMC)
ALBUMIN: 2.6 g/dL — AB (ref 3.4–5.0)
AST: 241 U/L — AB (ref 15–37)
Alkaline Phosphatase: 96 U/L
Bilirubin, Direct: 1 mg/dL — ABNORMAL HIGH (ref 0.0–0.2)
Bilirubin,Total: 1.5 mg/dL — ABNORMAL HIGH (ref 0.2–1.0)
SGPT (ALT): 2268 U/L — ABNORMAL HIGH
Total Protein: 5.7 g/dL — ABNORMAL LOW (ref 6.4–8.2)

## 2014-05-06 LAB — AMMONIA: Ammonia, Plasma: 48 umol/L — ABNORMAL HIGH (ref 11–32)

## 2014-05-07 LAB — CBC WITH DIFFERENTIAL/PLATELET
BASOS ABS: 0 10*3/uL (ref 0.0–0.1)
Basophil %: 0.4 %
Eosinophil #: 0 10*3/uL (ref 0.0–0.7)
Eosinophil %: 0.1 %
HCT: 32.1 % — ABNORMAL LOW (ref 40.0–52.0)
HGB: 10.1 g/dL — ABNORMAL LOW (ref 13.0–18.0)
LYMPHS PCT: 5.8 %
Lymphocyte #: 0.5 10*3/uL — ABNORMAL LOW (ref 1.0–3.6)
MCH: 27.7 pg (ref 26.0–34.0)
MCHC: 31.4 g/dL — ABNORMAL LOW (ref 32.0–36.0)
MCV: 88 fL (ref 80–100)
MONO ABS: 0.6 x10 3/mm (ref 0.2–1.0)
MONOS PCT: 6.2 %
NEUTROS ABS: 7.8 10*3/uL — AB (ref 1.4–6.5)
NEUTROS PCT: 87.5 %
PLATELETS: 78 10*3/uL — AB (ref 150–440)
RBC: 3.65 10*6/uL — AB (ref 4.40–5.90)
RDW: 15.6 % — ABNORMAL HIGH (ref 11.5–14.5)
WBC: 8.9 10*3/uL (ref 3.8–10.6)

## 2014-05-07 LAB — COMPREHENSIVE METABOLIC PANEL
ALT: 1233 U/L — AB
AST: 125 U/L — AB (ref 15–37)
Albumin: 2.4 g/dL — ABNORMAL LOW (ref 3.4–5.0)
Alkaline Phosphatase: 82 U/L
Anion Gap: 6 — ABNORMAL LOW (ref 7–16)
BUN: 49 mg/dL — ABNORMAL HIGH (ref 7–18)
Bilirubin,Total: 2.1 mg/dL — ABNORMAL HIGH (ref 0.2–1.0)
CALCIUM: 8 mg/dL — AB (ref 8.5–10.1)
CREATININE: 3.42 mg/dL — AB (ref 0.60–1.30)
Chloride: 105 mmol/L (ref 98–107)
Co2: 33 mmol/L — ABNORMAL HIGH (ref 21–32)
EGFR (Non-African Amer.): 19 — ABNORMAL LOW
GFR CALC AF AMER: 24 — AB
Glucose: 132 mg/dL — ABNORMAL HIGH (ref 65–99)
Osmolality: 302 (ref 275–301)
Potassium: 4.2 mmol/L (ref 3.5–5.1)
Sodium: 144 mmol/L (ref 136–145)
TOTAL PROTEIN: 5.5 g/dL — AB (ref 6.4–8.2)

## 2014-05-07 LAB — PHOSPHORUS: Phosphorus: 4.8 mg/dL (ref 2.5–4.9)

## 2014-05-07 LAB — AMMONIA: Ammonia, Plasma: 57 mcmol/L — ABNORMAL HIGH (ref 11–32)

## 2014-05-07 LAB — MAGNESIUM: Magnesium: 2.4 mg/dL

## 2014-05-08 LAB — COMPREHENSIVE METABOLIC PANEL
AST: 82 U/L — AB (ref 15–37)
Albumin: 2.3 g/dL — ABNORMAL LOW (ref 3.4–5.0)
Alkaline Phosphatase: 80 U/L
Anion Gap: 9 (ref 7–16)
BUN: 85 mg/dL — AB (ref 7–18)
Bilirubin,Total: 1.4 mg/dL — ABNORMAL HIGH (ref 0.2–1.0)
CALCIUM: 8.2 mg/dL — AB (ref 8.5–10.1)
CHLORIDE: 107 mmol/L (ref 98–107)
CO2: 26 mmol/L (ref 21–32)
CREATININE: 5.57 mg/dL — AB (ref 0.60–1.30)
EGFR (African American): 13 — ABNORMAL LOW
GFR CALC NON AF AMER: 11 — AB
Glucose: 149 mg/dL — ABNORMAL HIGH (ref 65–99)
Osmolality: 312 (ref 275–301)
Potassium: 4.5 mmol/L (ref 3.5–5.1)
SGPT (ALT): 834 U/L — ABNORMAL HIGH
Sodium: 142 mmol/L (ref 136–145)
Total Protein: 5.5 g/dL — ABNORMAL LOW (ref 6.4–8.2)

## 2014-05-08 LAB — PROTIME-INR
INR: 1.3
Prothrombin Time: 15.9 secs — ABNORMAL HIGH (ref 11.5–14.7)

## 2014-05-08 LAB — PLATELET COUNT: Platelet: 57 10*3/uL — ABNORMAL LOW (ref 150–440)

## 2014-05-08 LAB — AMMONIA: Ammonia, Plasma: 60 mcmol/L — ABNORMAL HIGH (ref 11–32)

## 2014-05-09 LAB — COMPREHENSIVE METABOLIC PANEL
ALK PHOS: 81 U/L
ALT: 617 U/L — AB
ANION GAP: 7 (ref 7–16)
AST: 82 U/L — AB (ref 15–37)
Albumin: 2.5 g/dL — ABNORMAL LOW (ref 3.4–5.0)
BUN: 70 mg/dL — AB (ref 7–18)
Bilirubin,Total: 1.5 mg/dL — ABNORMAL HIGH (ref 0.2–1.0)
CO2: 31 mmol/L (ref 21–32)
CREATININE: 5.18 mg/dL — AB (ref 0.60–1.30)
Calcium, Total: 8.3 mg/dL — ABNORMAL LOW (ref 8.5–10.1)
Chloride: 106 mmol/L (ref 98–107)
GFR CALC AF AMER: 15 — AB
GFR CALC NON AF AMER: 12 — AB
Glucose: 111 mg/dL — ABNORMAL HIGH (ref 65–99)
Osmolality: 308 (ref 275–301)
POTASSIUM: 4.1 mmol/L (ref 3.5–5.1)
Sodium: 144 mmol/L (ref 136–145)
Total Protein: 5.7 g/dL — ABNORMAL LOW (ref 6.4–8.2)

## 2014-05-09 LAB — CBC WITH DIFFERENTIAL/PLATELET
Basophil #: 0 10*3/uL (ref 0.0–0.1)
Basophil %: 0.3 %
EOS ABS: 0.2 10*3/uL (ref 0.0–0.7)
Eosinophil %: 1.7 %
HCT: 30.6 % — AB (ref 40.0–52.0)
HGB: 9.6 g/dL — AB (ref 13.0–18.0)
Lymphocyte #: 0.7 10*3/uL — ABNORMAL LOW (ref 1.0–3.6)
Lymphocyte %: 6.9 %
MCH: 27.5 pg (ref 26.0–34.0)
MCHC: 31.2 g/dL — ABNORMAL LOW (ref 32.0–36.0)
MCV: 88 fL (ref 80–100)
Monocyte #: 0.6 x10 3/mm (ref 0.2–1.0)
Monocyte %: 6.2 %
Neutrophil #: 8.8 10*3/uL — ABNORMAL HIGH (ref 1.4–6.5)
Neutrophil %: 84.9 %
PLATELETS: 50 10*3/uL — AB (ref 150–440)
RBC: 3.48 10*6/uL — ABNORMAL LOW (ref 4.40–5.90)
RDW: 16 % — AB (ref 11.5–14.5)
WBC: 10.4 10*3/uL (ref 3.8–10.6)

## 2014-05-09 LAB — PHOSPHORUS: Phosphorus: 5.4 mg/dL — ABNORMAL HIGH (ref 2.5–4.9)

## 2014-05-09 LAB — AMMONIA: AMMONIA, PLASMA: 61 umol/L — AB (ref 11–32)

## 2014-05-10 LAB — CBC WITH DIFFERENTIAL/PLATELET
Basophil #: 0.1 10*3/uL (ref 0.0–0.1)
Basophil %: 0.5 %
EOS ABS: 0.1 10*3/uL (ref 0.0–0.7)
Eosinophil %: 1.1 %
HCT: 29 % — AB (ref 40.0–52.0)
HGB: 9 g/dL — AB (ref 13.0–18.0)
Lymphocyte #: 0.7 10*3/uL — ABNORMAL LOW (ref 1.0–3.6)
Lymphocyte %: 5.7 %
MCH: 27.3 pg (ref 26.0–34.0)
MCHC: 31.1 g/dL — AB (ref 32.0–36.0)
MCV: 88 fL (ref 80–100)
MONO ABS: 0.7 x10 3/mm (ref 0.2–1.0)
Monocyte %: 6.2 %
Neutrophil #: 10.3 10*3/uL — ABNORMAL HIGH (ref 1.4–6.5)
Neutrophil %: 86.5 %
PLATELETS: 43 10*3/uL — AB (ref 150–440)
RBC: 3.29 10*6/uL — ABNORMAL LOW (ref 4.40–5.90)
RDW: 16.4 % — ABNORMAL HIGH (ref 11.5–14.5)
WBC: 11.9 10*3/uL — ABNORMAL HIGH (ref 3.8–10.6)

## 2014-05-10 LAB — RENAL FUNCTION PANEL
Albumin: 2.5 g/dL — ABNORMAL LOW (ref 3.4–5.0)
Anion Gap: 10 (ref 7–16)
BUN: 80 mg/dL — AB (ref 7–18)
CALCIUM: 8.3 mg/dL — AB (ref 8.5–10.1)
Chloride: 103 mmol/L (ref 98–107)
Co2: 29 mmol/L (ref 21–32)
Creatinine: 6.44 mg/dL — ABNORMAL HIGH (ref 0.60–1.30)
EGFR (Non-African Amer.): 9 — ABNORMAL LOW
GFR CALC AF AMER: 11 — AB
Glucose: 114 mg/dL — ABNORMAL HIGH (ref 65–99)
Osmolality: 308 (ref 275–301)
Phosphorus: 5.8 mg/dL — ABNORMAL HIGH (ref 2.5–4.9)
Potassium: 4.2 mmol/L (ref 3.5–5.1)
Sodium: 142 mmol/L (ref 136–145)

## 2014-05-11 LAB — BASIC METABOLIC PANEL
ANION GAP: 11 (ref 7–16)
BUN: 89 mg/dL — ABNORMAL HIGH (ref 7–18)
CREATININE: 7.72 mg/dL — AB (ref 0.60–1.30)
Calcium, Total: 8 mg/dL — ABNORMAL LOW (ref 8.5–10.1)
Chloride: 100 mmol/L (ref 98–107)
Co2: 27 mmol/L (ref 21–32)
EGFR (Non-African Amer.): 8 — ABNORMAL LOW
GFR CALC AF AMER: 9 — AB
Glucose: 140 mg/dL — ABNORMAL HIGH (ref 65–99)
Osmolality: 305 (ref 275–301)
Potassium: 4.2 mmol/L (ref 3.5–5.1)
Sodium: 138 mmol/L (ref 136–145)

## 2014-05-11 LAB — CBC WITH DIFFERENTIAL/PLATELET
Basophil #: 0.1 10*3/uL (ref 0.0–0.1)
Basophil %: 0.5 %
EOS ABS: 0.3 10*3/uL (ref 0.0–0.7)
Eosinophil %: 2.3 %
HCT: 27.1 % — AB (ref 40.0–52.0)
HGB: 8.5 g/dL — ABNORMAL LOW (ref 13.0–18.0)
Lymphocyte #: 0.5 10*3/uL — ABNORMAL LOW (ref 1.0–3.6)
Lymphocyte %: 4.7 %
MCH: 27.4 pg (ref 26.0–34.0)
MCHC: 31.4 g/dL — ABNORMAL LOW (ref 32.0–36.0)
MCV: 87 fL (ref 80–100)
MONO ABS: 0.7 x10 3/mm (ref 0.2–1.0)
Monocyte %: 6.3 %
NEUTROS ABS: 9.9 10*3/uL — AB (ref 1.4–6.5)
Neutrophil %: 86.2 %
Platelet: 42 10*3/uL — ABNORMAL LOW (ref 150–440)
RBC: 3.11 10*6/uL — ABNORMAL LOW (ref 4.40–5.90)
RDW: 16 % — ABNORMAL HIGH (ref 11.5–14.5)
WBC: 11.5 10*3/uL — ABNORMAL HIGH (ref 3.8–10.6)

## 2014-05-11 LAB — MAGNESIUM: Magnesium: 2.8 mg/dL — ABNORMAL HIGH

## 2014-05-12 LAB — CBC WITH DIFFERENTIAL/PLATELET
BASOS ABS: 0 10*3/uL (ref 0.0–0.1)
BASOS PCT: 0.1 %
EOS PCT: 1.1 %
Eosinophil #: 0.1 10*3/uL (ref 0.0–0.7)
HCT: 26.8 % — AB (ref 40.0–52.0)
HGB: 8.4 g/dL — ABNORMAL LOW (ref 13.0–18.0)
LYMPHS PCT: 4.7 %
Lymphocyte #: 0.6 10*3/uL — ABNORMAL LOW (ref 1.0–3.6)
MCH: 27.5 pg (ref 26.0–34.0)
MCHC: 31.5 g/dL — ABNORMAL LOW (ref 32.0–36.0)
MCV: 87 fL (ref 80–100)
MONO ABS: 0.8 x10 3/mm (ref 0.2–1.0)
Monocyte %: 6.7 %
Neutrophil #: 10.2 10*3/uL — ABNORMAL HIGH (ref 1.4–6.5)
Neutrophil %: 87.4 %
Platelet: 40 10*3/uL — ABNORMAL LOW (ref 150–440)
RBC: 3.07 10*6/uL — AB (ref 4.40–5.90)
RDW: 16.6 % — ABNORMAL HIGH (ref 11.5–14.5)
WBC: 11.7 10*3/uL — AB (ref 3.8–10.6)

## 2014-05-12 LAB — BASIC METABOLIC PANEL
Anion Gap: 9 (ref 7–16)
BUN: 92 mg/dL — AB (ref 7–18)
CHLORIDE: 102 mmol/L (ref 98–107)
CREATININE: 8.91 mg/dL — AB (ref 0.60–1.30)
Calcium, Total: 8.2 mg/dL — ABNORMAL LOW (ref 8.5–10.1)
Co2: 27 mmol/L (ref 21–32)
GFR CALC AF AMER: 8 — AB
GFR CALC NON AF AMER: 6 — AB
GLUCOSE: 103 mg/dL — AB (ref 65–99)
Osmolality: 304 (ref 275–301)
POTASSIUM: 4.7 mmol/L (ref 3.5–5.1)
SODIUM: 138 mmol/L (ref 136–145)

## 2014-05-12 LAB — COMPREHENSIVE METABOLIC PANEL
ALBUMIN: 2.5 g/dL — AB (ref 3.4–5.0)
ALK PHOS: 67 U/L
Anion Gap: 4 — ABNORMAL LOW (ref 7–16)
BILIRUBIN TOTAL: 1.2 mg/dL — AB (ref 0.2–1.0)
BUN: 38 mg/dL — AB (ref 7–18)
CALCIUM: 7.9 mg/dL — AB (ref 8.5–10.1)
CHLORIDE: 100 mmol/L (ref 98–107)
Co2: 34 mmol/L — ABNORMAL HIGH (ref 21–32)
Creatinine: 4.34 mg/dL — ABNORMAL HIGH (ref 0.60–1.30)
GLUCOSE: 118 mg/dL — AB (ref 65–99)
Osmolality: 286 (ref 275–301)
Potassium: 3.7 mmol/L (ref 3.5–5.1)
SGOT(AST): 53 U/L — ABNORMAL HIGH (ref 15–37)
SGPT (ALT): 278 U/L — ABNORMAL HIGH
SODIUM: 138 mmol/L (ref 136–145)
Total Protein: 5.9 g/dL — ABNORMAL LOW (ref 6.4–8.2)

## 2014-05-12 LAB — LACTATE DEHYDROGENASE: LDH: 282 U/L — ABNORMAL HIGH (ref 85–241)

## 2014-05-12 LAB — PHOSPHORUS: Phosphorus: 8.4 mg/dL — ABNORMAL HIGH (ref 2.5–4.9)

## 2014-05-13 LAB — BASIC METABOLIC PANEL
Anion Gap: 7 (ref 7–16)
BUN: 45 mg/dL — ABNORMAL HIGH (ref 7–18)
CHLORIDE: 100 mmol/L (ref 98–107)
CO2: 33 mmol/L — AB (ref 21–32)
Calcium, Total: 8.1 mg/dL — ABNORMAL LOW (ref 8.5–10.1)
Creatinine: 5.46 mg/dL — ABNORMAL HIGH (ref 0.60–1.30)
EGFR (African American): 14 — ABNORMAL LOW
EGFR (Non-African Amer.): 11 — ABNORMAL LOW
Glucose: 95 mg/dL (ref 65–99)
Osmolality: 291 (ref 275–301)
Potassium: 3.8 mmol/L (ref 3.5–5.1)
SODIUM: 140 mmol/L (ref 136–145)

## 2014-05-14 LAB — BASIC METABOLIC PANEL
Anion Gap: 7 (ref 7–16)
BUN: 54 mg/dL — ABNORMAL HIGH (ref 7–18)
CHLORIDE: 101 mmol/L (ref 98–107)
Calcium, Total: 8 mg/dL — ABNORMAL LOW (ref 8.5–10.1)
Co2: 31 mmol/L (ref 21–32)
Creatinine: 6.49 mg/dL — ABNORMAL HIGH (ref 0.60–1.30)
EGFR (African American): 11 — ABNORMAL LOW
GFR CALC NON AF AMER: 9 — AB
Glucose: 176 mg/dL — ABNORMAL HIGH (ref 65–99)
Osmolality: 297 (ref 275–301)
Potassium: 4.1 mmol/L (ref 3.5–5.1)
Sodium: 139 mmol/L (ref 136–145)

## 2014-05-17 ENCOUNTER — Ambulatory Visit: Payer: Self-pay | Admitting: Nephrology

## 2014-05-17 LAB — RENAL FUNCTION PANEL
Albumin: 2.5 g/dL — ABNORMAL LOW (ref 3.4–5.0)
Anion Gap: 7 (ref 7–16)
BUN: 42 mg/dL — AB (ref 7–18)
CALCIUM: 8.5 mg/dL (ref 8.5–10.1)
CREATININE: 5.78 mg/dL — AB (ref 0.60–1.30)
Chloride: 98 mmol/L (ref 98–107)
Co2: 31 mmol/L (ref 21–32)
EGFR (African American): 13 — ABNORMAL LOW
GFR CALC NON AF AMER: 11 — AB
GLUCOSE: 120 mg/dL — AB (ref 65–99)
OSMOLALITY: 284 (ref 275–301)
POTASSIUM: 4.4 mmol/L (ref 3.5–5.1)
Phosphorus: 5.4 mg/dL — ABNORMAL HIGH (ref 2.5–4.9)
Sodium: 136 mmol/L (ref 136–145)

## 2014-05-24 ENCOUNTER — Ambulatory Visit: Payer: Self-pay | Admitting: Nephrology

## 2014-05-24 LAB — RENAL FUNCTION PANEL
Albumin: 2.7 g/dL — ABNORMAL LOW (ref 3.4–5.0)
Anion Gap: 6 — ABNORMAL LOW (ref 7–16)
BUN: 31 mg/dL — ABNORMAL HIGH (ref 7–18)
CHLORIDE: 103 mmol/L (ref 98–107)
CO2: 33 mmol/L — AB (ref 21–32)
CREATININE: 2.86 mg/dL — AB (ref 0.60–1.30)
Calcium, Total: 9 mg/dL (ref 8.5–10.1)
EGFR (African American): 29 — ABNORMAL LOW
GFR CALC NON AF AMER: 24 — AB
Glucose: 157 mg/dL — ABNORMAL HIGH (ref 65–99)
Osmolality: 293 (ref 275–301)
PHOSPHORUS: 4.4 mg/dL (ref 2.5–4.9)
Potassium: 4.4 mmol/L (ref 3.5–5.1)
Sodium: 142 mmol/L (ref 136–145)

## 2014-05-26 ENCOUNTER — Ambulatory Visit: Payer: Self-pay | Admitting: Vascular Surgery

## 2014-05-28 ENCOUNTER — Ambulatory Visit: Payer: Self-pay | Admitting: Internal Medicine

## 2014-05-28 HISTORY — PX: ICD IMPLANT: EP1208

## 2014-05-28 HISTORY — PX: OTHER SURGICAL HISTORY: SHX169

## 2014-05-29 ENCOUNTER — Inpatient Hospital Stay: Payer: Self-pay | Admitting: Internal Medicine

## 2014-05-29 LAB — COMPREHENSIVE METABOLIC PANEL
ALBUMIN: 2.8 g/dL — AB (ref 3.4–5.0)
ANION GAP: 4 — AB (ref 7–16)
Alkaline Phosphatase: 75 U/L
BUN: 27 mg/dL — AB (ref 7–18)
Bilirubin,Total: 1.3 mg/dL — ABNORMAL HIGH (ref 0.2–1.0)
Calcium, Total: 8.6 mg/dL (ref 8.5–10.1)
Chloride: 96 mmol/L — ABNORMAL LOW (ref 98–107)
Co2: 39 mmol/L — ABNORMAL HIGH (ref 21–32)
Creatinine: 2.5 mg/dL — ABNORMAL HIGH (ref 0.60–1.30)
EGFR (African American): 34 — ABNORMAL LOW
EGFR (Non-African Amer.): 28 — ABNORMAL LOW
Glucose: 165 mg/dL — ABNORMAL HIGH (ref 65–99)
Osmolality: 286 (ref 275–301)
Potassium: 4 mmol/L (ref 3.5–5.1)
SGOT(AST): 36 U/L (ref 15–37)
SGPT (ALT): 32 U/L
SODIUM: 139 mmol/L (ref 136–145)
Total Protein: 7.1 g/dL (ref 6.4–8.2)

## 2014-05-29 LAB — CBC
HCT: 30.9 % — AB (ref 40.0–52.0)
HGB: 8.9 g/dL — ABNORMAL LOW (ref 13.0–18.0)
MCH: 26.8 pg (ref 26.0–34.0)
MCHC: 28.9 g/dL — ABNORMAL LOW (ref 32.0–36.0)
MCV: 93 fL (ref 80–100)
Platelet: 114 10*3/uL — ABNORMAL LOW (ref 150–440)
RBC: 3.33 10*6/uL — ABNORMAL LOW (ref 4.40–5.90)
RDW: 17.6 % — AB (ref 11.5–14.5)
WBC: 12.3 10*3/uL — ABNORMAL HIGH (ref 3.8–10.6)

## 2014-05-29 LAB — DRUG SCREEN, URINE
Amphetamines, Ur Screen: NEGATIVE (ref ?–1000)
BARBITURATES, UR SCREEN: NEGATIVE (ref ?–200)
Benzodiazepine, Ur Scrn: POSITIVE (ref ?–200)
Cannabinoid 50 Ng, Ur ~~LOC~~: NEGATIVE (ref ?–50)
Cocaine Metabolite,Ur ~~LOC~~: NEGATIVE (ref ?–300)
MDMA (Ecstasy)Ur Screen: NEGATIVE (ref ?–500)
Methadone, Ur Screen: NEGATIVE (ref ?–300)
OPIATE, UR SCREEN: POSITIVE (ref ?–300)
Phencyclidine (PCP) Ur S: NEGATIVE (ref ?–25)
Tricyclic, Ur Screen: NEGATIVE (ref ?–1000)

## 2014-05-29 LAB — CK TOTAL AND CKMB (NOT AT ARMC)
CK, TOTAL: 32 U/L — AB (ref 39–308)
CK-MB: 3.1 ng/mL (ref 0.5–3.6)

## 2014-05-29 LAB — PROTIME-INR
INR: 1.2
Prothrombin Time: 15.4 secs — ABNORMAL HIGH (ref 11.5–14.7)

## 2014-05-29 LAB — URINALYSIS, COMPLETE
BILIRUBIN, UR: NEGATIVE
GLUCOSE, UR: NEGATIVE mg/dL (ref 0–75)
Hyaline Cast: 20
Ketone: NEGATIVE
Nitrite: NEGATIVE
PH: 5 (ref 4.5–8.0)
Protein: 100
Specific Gravity: 1.013 (ref 1.003–1.030)
Squamous Epithelial: <1
WBC UR: 25 /HPF (ref 0–5)

## 2014-05-29 LAB — TROPONIN I
Troponin-I: 0.08 ng/mL — ABNORMAL HIGH
Troponin-I: 0.08 ng/mL — ABNORMAL HIGH
Troponin-I: 0.09 ng/mL — ABNORMAL HIGH

## 2014-05-29 LAB — ETHANOL: Ethanol: <3 mg/dL

## 2014-05-29 LAB — AMMONIA: AMMONIA, PLASMA: 59 umol/L — AB (ref 11–32)

## 2014-05-29 LAB — TSH: THYROID STIMULATING HORM: 0.45 u[IU]/mL

## 2014-05-30 LAB — CBC WITH DIFFERENTIAL/PLATELET
BASOS ABS: 0 10*3/uL (ref 0.0–0.1)
BASOS PCT: 0.1 %
BASOS PCT: 0.1 %
Basophil #: 0 10*3/uL (ref 0.0–0.1)
EOS ABS: 0 10*3/uL (ref 0.0–0.7)
EOS ABS: 0 10*3/uL (ref 0.0–0.7)
Eosinophil %: 0 %
Eosinophil %: 0 %
HCT: 24.5 % — AB (ref 40.0–52.0)
HCT: 24.7 % — ABNORMAL LOW (ref 40.0–52.0)
HGB: 7.4 g/dL — AB (ref 13.0–18.0)
HGB: 7.5 g/dL — AB (ref 13.0–18.0)
LYMPHS PCT: 5.4 %
LYMPHS PCT: 5.4 %
Lymphocyte #: 0.2 10*3/uL — ABNORMAL LOW (ref 1.0–3.6)
Lymphocyte #: 0.2 10*3/uL — ABNORMAL LOW (ref 1.0–3.6)
MCH: 26.4 pg (ref 26.0–34.0)
MCH: 26.4 pg (ref 26.0–34.0)
MCHC: 30.4 g/dL — ABNORMAL LOW (ref 32.0–36.0)
MCHC: 30.4 g/dL — AB (ref 32.0–36.0)
MCV: 87 fL (ref 80–100)
MCV: 87 fL (ref 80–100)
MONO ABS: 0.1 x10 3/mm — AB (ref 0.2–1.0)
MONOS PCT: 4 %
Monocyte #: 0.1 x10 3/mm — ABNORMAL LOW (ref 0.2–1.0)
Monocyte %: 3.8 %
NEUTROS PCT: 90.5 %
Neutrophil #: 3.4 10*3/uL (ref 1.4–6.5)
Neutrophil #: 3 10*3/uL (ref 1.4–6.5)
Neutrophil %: 90.7 %
Platelet: 88 10*3/uL — ABNORMAL LOW (ref 150–440)
Platelet: 92 10*3/uL — ABNORMAL LOW (ref 150–440)
RBC: 2.82 10*6/uL — ABNORMAL LOW (ref 4.40–5.90)
RBC: 2.85 10*6/uL — ABNORMAL LOW (ref 4.40–5.90)
RDW: 17.1 % — ABNORMAL HIGH (ref 11.5–14.5)
RDW: 17.1 % — AB (ref 11.5–14.5)
WBC: 3.7 10*3/uL — AB (ref 3.8–10.6)
WBC: 3.3 10*3/uL — ABNORMAL LOW (ref 3.8–10.6)

## 2014-05-30 LAB — BASIC METABOLIC PANEL
ANION GAP: 8 (ref 7–16)
Anion Gap: 5 — ABNORMAL LOW (ref 7–16)
BUN: 32 mg/dL — ABNORMAL HIGH (ref 7–18)
BUN: 32 mg/dL — ABNORMAL HIGH (ref 7–18)
CALCIUM: 8.4 mg/dL — AB (ref 8.5–10.1)
CHLORIDE: 98 mmol/L (ref 98–107)
CO2: 38 mmol/L — AB (ref 21–32)
Calcium, Total: 8.1 mg/dL — ABNORMAL LOW (ref 8.5–10.1)
Chloride: 98 mmol/L (ref 98–107)
Co2: 37 mmol/L — ABNORMAL HIGH (ref 21–32)
Creatinine: 2.18 mg/dL — ABNORMAL HIGH (ref 0.60–1.30)
Creatinine: 2.23 mg/dL — ABNORMAL HIGH (ref 0.60–1.30)
EGFR (African American): 40 — ABNORMAL LOW
EGFR (African American): 39 — ABNORMAL LOW
EGFR (Non-African Amer.): 33 — ABNORMAL LOW
EGFR (Non-African Amer.): 32 — ABNORMAL LOW
GLUCOSE: 179 mg/dL — AB (ref 65–99)
Glucose: 192 mg/dL — ABNORMAL HIGH (ref 65–99)
OSMOLALITY: 296 (ref 275–301)
Osmolality: 293 (ref 275–301)
Potassium: 2.8 mmol/L — ABNORMAL LOW (ref 3.5–5.1)
Potassium: 2.9 mmol/L — ABNORMAL LOW (ref 3.5–5.1)
SODIUM: 141 mmol/L (ref 136–145)
Sodium: 143 mmol/L (ref 136–145)

## 2014-05-30 LAB — MAGNESIUM
Magnesium: 1.2 mg/dL — ABNORMAL LOW
Magnesium: 1.6 mg/dL — ABNORMAL LOW

## 2014-05-30 LAB — LIPID PANEL
CHOLESTEROL: 64 mg/dL (ref 0–200)
HDL: 15 mg/dL — AB (ref 40–60)
Ldl Cholesterol, Calc: 27 mg/dL (ref 0–100)
TRIGLYCERIDES: 108 mg/dL (ref 0–200)
VLDL CHOLESTEROL, CALC: 22 mg/dL (ref 5–40)

## 2014-05-30 LAB — POTASSIUM
Potassium: 2.9 mmol/L — ABNORMAL LOW (ref 3.5–5.1)
Potassium: 3.1 mmol/L — ABNORMAL LOW (ref 3.5–5.1)

## 2014-05-30 LAB — AMMONIA: Ammonia, Plasma: 16 mcmol/L (ref 11–32)

## 2014-05-31 LAB — COMPREHENSIVE METABOLIC PANEL
ALK PHOS: 61 U/L
AST: 27 U/L (ref 15–37)
Albumin: 2.2 g/dL — ABNORMAL LOW (ref 3.4–5.0)
Anion Gap: 7 (ref 7–16)
BILIRUBIN TOTAL: 1.3 mg/dL — AB (ref 0.2–1.0)
BUN: 37 mg/dL — AB (ref 7–18)
CALCIUM: 8.2 mg/dL — AB (ref 8.5–10.1)
Chloride: 99 mmol/L (ref 98–107)
Co2: 38 mmol/L — ABNORMAL HIGH (ref 21–32)
Creatinine: 2.04 mg/dL — ABNORMAL HIGH (ref 0.60–1.30)
EGFR (African American): 43 — ABNORMAL LOW
EGFR (Non-African Amer.): 35 — ABNORMAL LOW
Glucose: 183 mg/dL — ABNORMAL HIGH (ref 65–99)
OSMOLALITY: 300 (ref 275–301)
Potassium: 3.1 mmol/L — ABNORMAL LOW (ref 3.5–5.1)
SGPT (ALT): 25 U/L
Sodium: 144 mmol/L (ref 136–145)
Total Protein: 5.9 g/dL — ABNORMAL LOW (ref 6.4–8.2)

## 2014-05-31 LAB — CBC WITH DIFFERENTIAL/PLATELET
BASOS ABS: 0 10*3/uL (ref 0.0–0.1)
Basophil %: 0 %
EOS PCT: 0.1 %
Eosinophil #: 0 10*3/uL (ref 0.0–0.7)
HCT: 23.6 % — ABNORMAL LOW (ref 40.0–52.0)
HGB: 7.1 g/dL — ABNORMAL LOW (ref 13.0–18.0)
Lymphocyte #: 0.2 10*3/uL — ABNORMAL LOW (ref 1.0–3.6)
Lymphocyte %: 4.2 %
MCH: 26.3 pg (ref 26.0–34.0)
MCHC: 30.3 g/dL — ABNORMAL LOW (ref 32.0–36.0)
MCV: 87 fL (ref 80–100)
Monocyte #: 0.2 x10 3/mm (ref 0.2–1.0)
Monocyte %: 4 %
Neutrophil #: 3.7 10*3/uL (ref 1.4–6.5)
Neutrophil %: 91.7 %
PLATELETS: 90 10*3/uL — AB (ref 150–440)
RBC: 2.71 10*6/uL — AB (ref 4.40–5.90)
RDW: 17.1 % — ABNORMAL HIGH (ref 11.5–14.5)
WBC: 4 10*3/uL (ref 3.8–10.6)

## 2014-05-31 LAB — URINE CULTURE

## 2014-05-31 LAB — RAPID HIV SCREEN (HIV 1/2 AB+AG)

## 2014-05-31 LAB — MAGNESIUM: Magnesium: 2 mg/dL

## 2014-06-01 LAB — BASIC METABOLIC PANEL
Anion Gap: 6 — ABNORMAL LOW (ref 7–16)
BUN: 40 mg/dL — ABNORMAL HIGH (ref 7–18)
CALCIUM: 8.2 mg/dL — AB (ref 8.5–10.1)
CREATININE: 1.82 mg/dL — AB (ref 0.60–1.30)
Chloride: 100 mmol/L (ref 98–107)
Co2: 38 mmol/L — ABNORMAL HIGH (ref 21–32)
EGFR (Non-African Amer.): 40 — ABNORMAL LOW
GFR CALC AF AMER: 49 — AB
Glucose: 182 mg/dL — ABNORMAL HIGH (ref 65–99)
Osmolality: 301 (ref 275–301)
Potassium: 3.1 mmol/L — ABNORMAL LOW (ref 3.5–5.1)
Sodium: 144 mmol/L (ref 136–145)

## 2014-06-01 LAB — EXPECTORATED SPUTUM ASSESSMENT W REFEX TO RESP CULTURE

## 2014-06-02 LAB — AMMONIA: Ammonia, Plasma: 25 mcmol/L (ref 11–32)

## 2014-06-02 LAB — CBC WITH DIFFERENTIAL/PLATELET
BASOS ABS: 0 10*3/uL (ref 0.0–0.1)
BASOS PCT: 0.1 %
EOS ABS: 0 10*3/uL (ref 0.0–0.7)
Eosinophil %: 0.1 %
HCT: 26.7 % — ABNORMAL LOW (ref 40.0–52.0)
HGB: 8 g/dL — ABNORMAL LOW (ref 13.0–18.0)
Lymphocyte #: 0.3 10*3/uL — ABNORMAL LOW (ref 1.0–3.6)
Lymphocyte %: 4.9 %
MCH: 26 pg (ref 26.0–34.0)
MCHC: 30 g/dL — AB (ref 32.0–36.0)
MCV: 87 fL (ref 80–100)
MONO ABS: 0.3 x10 3/mm (ref 0.2–1.0)
Monocyte %: 6.1 %
NEUTROS PCT: 88.8 %
Neutrophil #: 4.8 10*3/uL (ref 1.4–6.5)
Platelet: 94 10*3/uL — ABNORMAL LOW (ref 150–440)
RBC: 3.08 10*6/uL — AB (ref 4.40–5.90)
RDW: 17.2 % — AB (ref 11.5–14.5)
WBC: 5.5 10*3/uL (ref 3.8–10.6)

## 2014-06-02 LAB — BASIC METABOLIC PANEL
Anion Gap: 6 — ABNORMAL LOW (ref 7–16)
BUN: 37 mg/dL — ABNORMAL HIGH (ref 7–18)
CHLORIDE: 99 mmol/L (ref 98–107)
CO2: 39 mmol/L — AB (ref 21–32)
Calcium, Total: 8.4 mg/dL — ABNORMAL LOW (ref 8.5–10.1)
Creatinine: 1.52 mg/dL — ABNORMAL HIGH (ref 0.60–1.30)
EGFR (African American): 60 — ABNORMAL LOW
GFR CALC NON AF AMER: 49 — AB
Glucose: 171 mg/dL — ABNORMAL HIGH (ref 65–99)
OSMOLALITY: 300 (ref 275–301)
POTASSIUM: 3.3 mmol/L — AB (ref 3.5–5.1)
SODIUM: 144 mmol/L (ref 136–145)

## 2014-06-02 LAB — MAGNESIUM: MAGNESIUM: 1.3 mg/dL — AB

## 2014-06-02 LAB — HEMOGLOBIN A1C: Hemoglobin A1C: 5.3 % (ref 4.2–6.3)

## 2014-06-03 LAB — COMPREHENSIVE METABOLIC PANEL WITH GFR
Albumin: 2.6 g/dL — ABNORMAL LOW
Alkaline Phosphatase: 67 U/L
Anion Gap: 6 — ABNORMAL LOW
BUN: 32 mg/dL — ABNORMAL HIGH
Bilirubin,Total: 1.1 mg/dL — ABNORMAL HIGH
Calcium, Total: 8.2 mg/dL — ABNORMAL LOW
Chloride: 99 mmol/L
Co2: 38 mmol/L — ABNORMAL HIGH
Creatinine: 1.32 mg/dL — ABNORMAL HIGH
EGFR (African American): >60
EGFR (Non-African Amer.): 58 — ABNORMAL LOW
Glucose: 158 mg/dL — ABNORMAL HIGH
Osmolality: 295
Potassium: 3.3 mmol/L — ABNORMAL LOW
SGOT(AST): 67 U/L — ABNORMAL HIGH
SGPT (ALT): 73 U/L — ABNORMAL HIGH
Sodium: 143 mmol/L
Total Protein: 6.1 g/dL — ABNORMAL LOW

## 2014-06-03 LAB — CBC WITH DIFFERENTIAL/PLATELET
Basophil #: 0 10*3/uL (ref 0.0–0.1)
Basophil %: 0.3 %
EOS PCT: 0.3 %
Eosinophil #: 0 10*3/uL (ref 0.0–0.7)
HCT: 28.8 % — AB (ref 40.0–52.0)
HGB: 8.7 g/dL — ABNORMAL LOW (ref 13.0–18.0)
LYMPHS ABS: 0.6 10*3/uL — AB (ref 1.0–3.6)
Lymphocyte %: 6.1 %
MCH: 25.8 pg — ABNORMAL LOW (ref 26.0–34.0)
MCHC: 30.2 g/dL — ABNORMAL LOW (ref 32.0–36.0)
MCV: 86 fL (ref 80–100)
MONO ABS: 0.6 x10 3/mm (ref 0.2–1.0)
Monocyte %: 6.2 %
NEUTROS ABS: 8.3 10*3/uL — AB (ref 1.4–6.5)
Neutrophil %: 87.1 %
PLATELETS: 94 10*3/uL — AB (ref 150–440)
RBC: 3.37 10*6/uL — AB (ref 4.40–5.90)
RDW: 16.8 % — ABNORMAL HIGH (ref 11.5–14.5)
WBC: 9.5 10*3/uL (ref 3.8–10.6)

## 2014-06-03 LAB — CULTURE, BLOOD (SINGLE)

## 2014-06-03 LAB — POTASSIUM
POTASSIUM: 4 mmol/L (ref 3.5–5.1)
Potassium: 3.3 mmol/L — ABNORMAL LOW (ref 3.5–5.1)

## 2014-06-03 LAB — MAGNESIUM: Magnesium: 1.9 mg/dL

## 2014-06-20 ENCOUNTER — Inpatient Hospital Stay: Payer: Self-pay | Admitting: Internal Medicine

## 2014-06-20 DIAGNOSIS — I499 Cardiac arrhythmia, unspecified: Secondary | ICD-10-CM | POA: Diagnosis not present

## 2014-06-20 LAB — TROPONIN I
Troponin-I: 0.24 ng/mL — ABNORMAL HIGH
Troponin-I: 7.8 ng/mL — ABNORMAL HIGH
Troponin-I: 13 ng/mL — ABNORMAL HIGH

## 2014-06-20 LAB — COMPREHENSIVE METABOLIC PANEL
ALBUMIN: 3.2 g/dL — AB (ref 3.4–5.0)
ANION GAP: 10 (ref 7–16)
Alkaline Phosphatase: 144 U/L — ABNORMAL HIGH
BUN: 21 mg/dL — ABNORMAL HIGH (ref 7–18)
Bilirubin,Total: 1 mg/dL (ref 0.2–1.0)
Calcium, Total: 9 mg/dL (ref 8.5–10.1)
Chloride: 99 mmol/L (ref 98–107)
Co2: 30 mmol/L (ref 21–32)
Creatinine: 1.21 mg/dL (ref 0.60–1.30)
EGFR (African American): >60
EGFR (Non-African Amer.): >60
GLUCOSE: 183 mg/dL — AB (ref 65–99)
Osmolality: 285 (ref 275–301)
POTASSIUM: 3.8 mmol/L (ref 3.5–5.1)
SGOT(AST): 55 U/L — ABNORMAL HIGH (ref 15–37)
SGPT (ALT): 78 U/L — ABNORMAL HIGH
SODIUM: 139 mmol/L (ref 136–145)
Total Protein: 7.4 g/dL (ref 6.4–8.2)

## 2014-06-20 LAB — CBC
HCT: 35.3 % — ABNORMAL LOW (ref 40.0–52.0)
HGB: 10.6 g/dL — ABNORMAL LOW (ref 13.0–18.0)
MCH: 25.1 pg — ABNORMAL LOW (ref 26.0–34.0)
MCHC: 30.1 g/dL — ABNORMAL LOW (ref 32.0–36.0)
MCV: 83 fL (ref 80–100)
PLATELETS: 206 10*3/uL (ref 150–440)
RBC: 4.24 10*6/uL — ABNORMAL LOW (ref 4.40–5.90)
RDW: 18.1 % — AB (ref 11.5–14.5)
WBC: 18.2 10*3/uL — ABNORMAL HIGH (ref 3.8–10.6)

## 2014-06-20 LAB — CK TOTAL AND CKMB (NOT AT ARMC)
CK, Total: 20 U/L — ABNORMAL LOW (ref 39–308)
CK, Total: 100 U/L (ref 39–308)
CK, Total: 111 U/L (ref 39–308)
CK-MB: 4.6 ng/mL — ABNORMAL HIGH (ref 0.5–3.6)
CK-MB: 27.6 ng/mL — ABNORMAL HIGH (ref 0.5–3.6)
CK-MB: 31 ng/mL — AB (ref 0.5–3.6)

## 2014-06-20 LAB — CK-MB: CK-MB: 29.4 ng/mL — ABNORMAL HIGH (ref 0.5–3.6)

## 2014-06-20 LAB — PRO B NATRIURETIC PEPTIDE: B-Type Natriuretic Peptide: 10778 pg/mL — ABNORMAL HIGH (ref 0–125)

## 2014-06-20 LAB — APTT: Activated PTT: 29.2 secs (ref 23.6–35.9)

## 2014-06-20 LAB — PROTIME-INR
INR: 1
PROTHROMBIN TIME: 13.3 s (ref 11.5–14.7)

## 2014-06-21 LAB — COMPREHENSIVE METABOLIC PANEL
ANION GAP: 8 (ref 7–16)
Albumin: 2.5 g/dL — ABNORMAL LOW (ref 3.4–5.0)
Alkaline Phosphatase: 67 U/L
BUN: 31 mg/dL — ABNORMAL HIGH (ref 7–18)
Bilirubin,Total: 0.7 mg/dL (ref 0.2–1.0)
CALCIUM: 8.7 mg/dL (ref 8.5–10.1)
CHLORIDE: 97 mmol/L — AB (ref 98–107)
Co2: 30 mmol/L (ref 21–32)
Creatinine: 1.39 mg/dL — ABNORMAL HIGH (ref 0.60–1.30)
EGFR (African American): >60
EGFR (Non-African Amer.): 55 — ABNORMAL LOW
Glucose: 185 mg/dL — ABNORMAL HIGH (ref 65–99)
Osmolality: 281 (ref 275–301)
POTASSIUM: 4.4 mmol/L (ref 3.5–5.1)
SGOT(AST): 88 U/L — ABNORMAL HIGH (ref 15–37)
SGPT (ALT): 57 U/L
Sodium: 135 mmol/L — ABNORMAL LOW (ref 136–145)
Total Protein: 6 g/dL — ABNORMAL LOW (ref 6.4–8.2)

## 2014-06-21 LAB — CBC WITH DIFFERENTIAL/PLATELET
BASOS ABS: 0 10*3/uL (ref 0.0–0.1)
BASOS ABS: 0 10*3/uL (ref 0.0–0.1)
Basophil %: 0.2 %
Basophil %: 0.2 %
EOS PCT: 0.1 %
Eosinophil #: 0 10*3/uL (ref 0.0–0.7)
Eosinophil #: 0 10*3/uL (ref 0.0–0.7)
Eosinophil %: 0 %
HCT: 26.9 % — ABNORMAL LOW (ref 40.0–52.0)
HCT: 24.5 % — AB (ref 40.0–52.0)
HGB: 8.3 g/dL — ABNORMAL LOW (ref 13.0–18.0)
HGB: 7.6 g/dL — ABNORMAL LOW (ref 13.0–18.0)
LYMPHS PCT: 4.1 %
LYMPHS PCT: 4.4 %
Lymphocyte #: 0.3 10*3/uL — ABNORMAL LOW (ref 1.0–3.6)
Lymphocyte #: 0.3 10*3/uL — ABNORMAL LOW (ref 1.0–3.6)
MCH: 25.6 pg — ABNORMAL LOW (ref 26.0–34.0)
MCH: 25.2 pg — ABNORMAL LOW (ref 26.0–34.0)
MCHC: 30.9 g/dL — ABNORMAL LOW (ref 32.0–36.0)
MCHC: 30.9 g/dL — ABNORMAL LOW (ref 32.0–36.0)
MCV: 83 fL (ref 80–100)
MCV: 82 fL (ref 80–100)
MONOS PCT: 2.3 %
MONOS PCT: 3.2 %
Monocyte #: 0.2 x10 3/mm (ref 0.2–1.0)
Monocyte #: 0.2 x10 3/mm (ref 0.2–1.0)
NEUTROS PCT: 93.3 %
Neutrophil #: 7.5 10*3/uL — ABNORMAL HIGH (ref 1.4–6.5)
Neutrophil #: 5.7 10*3/uL (ref 1.4–6.5)
Neutrophil %: 92.2 %
PLATELETS: 106 10*3/uL — AB (ref 150–440)
PLATELETS: 98 10*3/uL — AB (ref 150–440)
RBC: 3.24 10*6/uL — ABNORMAL LOW (ref 4.40–5.90)
RBC: 3.01 10*6/uL — ABNORMAL LOW (ref 4.40–5.90)
RDW: 18.4 % — AB (ref 11.5–14.5)
RDW: 18.6 % — ABNORMAL HIGH (ref 11.5–14.5)
WBC: 8 10*3/uL (ref 3.8–10.6)
WBC: 6.1 10*3/uL (ref 3.8–10.6)

## 2014-06-21 LAB — PROTIME-INR
INR: 1.1
Prothrombin Time: 14.4 secs (ref 11.5–14.7)

## 2014-06-21 LAB — BASIC METABOLIC PANEL
Anion Gap: 9 (ref 7–16)
BUN: 39 mg/dL — ABNORMAL HIGH (ref 7–18)
CALCIUM: 8.3 mg/dL — AB (ref 8.5–10.1)
CO2: 29 mmol/L (ref 21–32)
Chloride: 93 mmol/L — ABNORMAL LOW (ref 98–107)
Creatinine: 1.66 mg/dL — ABNORMAL HIGH (ref 0.60–1.30)
EGFR (Non-African Amer.): 45 — ABNORMAL LOW
GFR CALC AF AMER: 54 — AB
GLUCOSE: 317 mg/dL — AB (ref 65–99)
OSMOLALITY: 284 (ref 275–301)
POTASSIUM: 4.5 mmol/L (ref 3.5–5.1)
Sodium: 131 mmol/L — ABNORMAL LOW (ref 136–145)

## 2014-06-21 LAB — APTT: ACTIVATED PTT: 32.1 s (ref 23.6–35.9)

## 2014-06-21 LAB — HEPARIN LEVEL (UNFRACTIONATED): ANTI-XA(UNFRACTIONATED): 0.38 [IU]/mL (ref 0.30–0.70)

## 2014-06-22 LAB — CBC WITH DIFFERENTIAL/PLATELET
BASOS PCT: 0.1 %
Basophil #: 0 10*3/uL (ref 0.0–0.1)
EOS ABS: 0 10*3/uL (ref 0.0–0.7)
Eosinophil %: 0 %
HCT: 25.3 % — AB (ref 40.0–52.0)
HGB: 7.8 g/dL — ABNORMAL LOW (ref 13.0–18.0)
Lymphocyte #: 0.2 10*3/uL — ABNORMAL LOW (ref 1.0–3.6)
Lymphocyte %: 3.6 %
MCH: 25.1 pg — ABNORMAL LOW (ref 26.0–34.0)
MCHC: 30.9 g/dL — AB (ref 32.0–36.0)
MCV: 81 fL (ref 80–100)
MONOS PCT: 2.7 %
Monocyte #: 0.1 x10 3/mm — ABNORMAL LOW (ref 0.2–1.0)
NEUTROS ABS: 4.3 10*3/uL (ref 1.4–6.5)
Neutrophil %: 93.6 %
Platelet: 97 10*3/uL — ABNORMAL LOW (ref 150–440)
RBC: 3.12 10*6/uL — AB (ref 4.40–5.90)
RDW: 18.5 % — ABNORMAL HIGH (ref 11.5–14.5)
WBC: 4.6 10*3/uL (ref 3.8–10.6)

## 2014-06-22 LAB — BASIC METABOLIC PANEL
Anion Gap: 5 — ABNORMAL LOW (ref 7–16)
BUN: 35 mg/dL — ABNORMAL HIGH (ref 7–18)
CREATININE: 1.26 mg/dL (ref 0.60–1.30)
Calcium, Total: 8.8 mg/dL (ref 8.5–10.1)
Chloride: 99 mmol/L (ref 98–107)
Co2: 32 mmol/L (ref 21–32)
EGFR (Non-African Amer.): >60
GLUCOSE: 232 mg/dL — AB (ref 65–99)
Osmolality: 287 (ref 275–301)
Potassium: 4.2 mmol/L (ref 3.5–5.1)
SODIUM: 136 mmol/L (ref 136–145)

## 2014-06-22 LAB — EXPECTORATED SPUTUM ASSESSMENT W REFEX TO RESP CULTURE

## 2014-06-22 LAB — HEPARIN LEVEL (UNFRACTIONATED): Anti-Xa(Unfractionated): 0.63 IU/mL (ref 0.30–0.70)

## 2014-06-23 LAB — COMPREHENSIVE METABOLIC PANEL
ALK PHOS: 69 U/L (ref 46–116)
ALT: 49 U/L (ref 14–63)
AST: 28 U/L (ref 15–37)
Albumin: 2.4 g/dL — ABNORMAL LOW (ref 3.4–5.0)
Anion Gap: 6 — ABNORMAL LOW (ref 7–16)
BUN: 36 mg/dL — AB (ref 7–18)
Bilirubin,Total: 0.5 mg/dL (ref 0.2–1.0)
CHLORIDE: 101 mmol/L (ref 98–107)
Calcium, Total: 8.7 mg/dL (ref 8.5–10.1)
Co2: 31 mmol/L (ref 21–32)
Creatinine: 1.11 mg/dL (ref 0.60–1.30)
EGFR (African American): >60
EGFR (Non-African Amer.): >60
GLUCOSE: 193 mg/dL — AB (ref 65–99)
Osmolality: 289 (ref 275–301)
Potassium: 4.2 mmol/L (ref 3.5–5.1)
Sodium: 138 mmol/L (ref 136–145)
TOTAL PROTEIN: 5.9 g/dL — AB (ref 6.4–8.2)

## 2014-06-23 LAB — CBC WITH DIFFERENTIAL/PLATELET
BASOS PCT: 0.2 %
Basophil #: 0 10*3/uL (ref 0.0–0.1)
EOS ABS: 0 10*3/uL (ref 0.0–0.7)
Eosinophil %: 0 %
HCT: 24.7 % — ABNORMAL LOW (ref 40.0–52.0)
HGB: 7.7 g/dL — AB (ref 13.0–18.0)
Lymphocyte #: 0.3 10*3/uL — ABNORMAL LOW (ref 1.0–3.6)
Lymphocyte %: 6.4 %
MCH: 25.2 pg — ABNORMAL LOW (ref 26.0–34.0)
MCHC: 31.2 g/dL — ABNORMAL LOW (ref 32.0–36.0)
MCV: 81 fL (ref 80–100)
MONO ABS: 0.2 x10 3/mm (ref 0.2–1.0)
Monocyte %: 4.1 %
NEUTROS ABS: 4.5 10*3/uL (ref 1.4–6.5)
Neutrophil %: 89.3 %
Platelet: 115 10*3/uL — ABNORMAL LOW (ref 150–440)
RBC: 3.07 10*6/uL — AB (ref 4.40–5.90)
RDW: 18.2 % — ABNORMAL HIGH (ref 11.5–14.5)
WBC: 5 10*3/uL (ref 3.8–10.6)

## 2014-06-23 LAB — HEPARIN LEVEL (UNFRACTIONATED): ANTI-XA(UNFRACTIONATED): 0.35 [IU]/mL (ref 0.30–0.70)

## 2014-06-25 LAB — CULTURE, BLOOD (SINGLE)

## 2014-07-23 DIAGNOSIS — D500 Iron deficiency anemia secondary to blood loss (chronic): Secondary | ICD-10-CM | POA: Insufficient documentation

## 2014-09-17 NOTE — Consult Note (Signed)
Brief Consult Note: Diagnosis: Pt followed at [facility] with history of cad s/p cabg and pci, history of aicd placment with cardiomyopathy who presented to er with complaints of chills. No chest pain or shortness of breath.   Patient was seen by consultant.   Recommend further assessment or treatment.   Comments: 64 yo male with history of cardiomyopathy, likely ischemic with aicd in place, hsitory of cad sp cab/pci who presented to the er with complaints of chills. He denied any chest pain or shorntess of breath. He present to the er and was noted to have mild troponin elevation of 0.18 and bnp elevation. Records of baseline bnp not currently available. He is currently stable . No chest pian or shorntess of breath. Somewhat more weak. Echo is pending. Will obtain records from [facility] regarding his baseline bnp and continue to rule out for mi. COntinue current outpatent meds and further recs pending course. Elevated troponin is likely secondary to chronic systollic heart failure an dnot an acute ischemic event, however, given prior history will treat as nstemi . Will review records from [facility] when availaable with regard to further invasive vs noninvasive work up. Echo reveals ef of 25%.  Electronic Signatures: Teodoro Spray (MD)  (Signed 13-Mar-14 20:22)  Authored: Brief Consult Note   Last Updated: 13-Mar-14 20:22 by Teodoro Spray (MD)

## 2014-09-17 NOTE — Consult Note (Signed)
PATIENT NAME:  William Bray MR#:  [id number] DATE OF BIRTH:  11-09-1950  DATE OF CONSULTATION:  04/24/2013  REFERRING PHYSICIAN:  Sona A. Posey Pronto, MD CONSULTING PHYSICIAN:  Corey Skains, MD  REASON FOR CONSULTATION: Acute subendocardial myocardial infarction, coronary artery disease, hypertension, hyperlipidemia, sleep apnea, with acute renal failure, dehydration and respiratory failure.   CHIEF COMPLAINT: The patient is obtunded and is intubated.   HISTORY OF PRESENT ILLNESS: This is a 64 year old male with known coronary disease status post previous myocardial infarction, coronary artery bypass graft and multiple stents, of which he has been on appropriate medication management and has had no evidence of acute myocardial infarction in the recent past. The patient has had appropriate treatment of hyperlipidemia, other risk factors, sleep apnea, diabetes and mild chronic kidney disease, when he has had some dehydration, acute renal disease, hypoxia and respiratory failure. At this time, he was required to be intubated and resuscitated. The patient has had improvements of oxygenation with intubation and oxygenation. The patient does have significant acute renal failure with a creatinine of 1.8 and now a subendocardial myocardial infarction, with elevation in troponin of 3.6, most consistent with his current illness rather than acute coronary syndrome. The patient has had no evidence of congestive heart failure at this stage. He has had an EKG showing normal sinus rhythm with left bundle branch block, which appears to be stable at this time on previous medications. He has had some hypotension due to this issue listed above and has had Levophed given. This Levophed has stabilized his blood pressure, and therefore cannot reinstate medication management for his previous LV dysfunction and previous history of myocardial infarction and coronary artery disease. The patient's remainder of review of systems  cannot be assessed.   PAST MEDICAL HISTORY:  1. Diabetes.  2. Hypertension.  3. Hyperlipidemia.  4. Sleep apnea.  5. Chronic kidney disease. 6. Previous stents.   FAMILY HISTORY: Some family members with early onset of cardiovascular disease and hypertension.   SOCIAL HISTORY: The patient apparently has no history of current use of alcohol or tobacco use but does have a remote history.   ALLERGIES: As listed.   MEDICATIONS: As listed.   PHYSICAL EXAMINATION:  VITAL SIGNS: Blood pressure is 100/60 bilaterally, heart rate is 72 upright.  GENERAL: He is a well-appearing intubated male in no apparent acute distress.  HEAD, EYES, EARS, NOSE AND THROAT: No icterus, thyromegaly, ulcers, hemorrhage or xanthelasma.  CARDIOVASCULAR: Regular rate and rhythm with normal S1 and S2, with a 2/6 apical murmur consistent with mitral regurgitation. PMI is diffuse. Carotid upstroke normal, without bruit. Jugular venous pressure is normal.  LUNGS: Have diffuse wheezes and a few basilar crackles.  ABDOMEN: Soft. No apparent hepatosplenomegaly or masses. Abdominal aorta is normal, without bruit.  EXTREMITIES: Show 2+ radial, femoral, dorsal pedal pulses with trace lower extremity edema. No cyanosis, clubbing or ulcers.  NEUROLOGIC: The patient is intubated and sedated.   ASSESSMENT: A 64 year old male with hypertension, hyperlipidemia, coronary artery disease, sleep apnea, with acute respiratory and renal failure, with elevated troponin consistent with acute subendocardial myocardial infarction.   RECOMMENDATIONS:  1. Heparin for 24 to 48 hours if able, for further risk reduction in acute myocardial infarction and other cardiovascular abnormality.  2. Continue hydration if able for acute renal disease and dehydration.  3. Continue oxygenation and further treatment of respiratory failure and other causes, including infection.  4. Reinstatement of beta blocker if able, but abstain from ACE inhibitor due to  chronic kidney disease.  5. Serial ECG and enzymes to assess extent of myocardial infarction.  6. Echocardiogram for LV systolic dysfunction and adjustments of medications thereof as necessary.  7. Further diagnostic testing and treatment options after the patient recovers and is extubated.   ____________________________ Corey Skains, MD bjk:lb D: 04/24/2013 07:36:09 ET T: 04/24/2013 07:49:08 ET JOB#: [id number]  cc: Corey Skains, MD, <Dictator> Corey Skains MD ELECTRONICALLY SIGNED 05/05/2013 12:57

## 2014-09-17 NOTE — H&P (Signed)
PATIENT NAME:  William Bray, William Bray MR#:  [id number] DATE OF BIRTH:  01-Nov-1950  DATE OF ADMISSION:  08/07/2012  PRIMARY CARE PHYSICIAN: At [facility].  PRIMARY CARDIOLOGIST: Dr. Cloretta Ned at [facility].  PRIMARY PULMONOLOGIST: Dr. Raul Del   PRESENTING COMPLAINT: Weakness and chills.   HISTORY OF PRESENT ILLNESS: The patient is a 64 year old male with extensive cardiac history and multiple interventions done in the past, status post pacemaker currently, history of diabetes, COPD with home oxygen on as-needed basis, hypertension, hyperlipidemia and gout. Has multiple cardiac blockages and stents and using nitroglycerin at home, also on as-needed basis. Was in his usual state of health until 2 to 3 days ago. Then while passing through a small door, he stuck his foot and he fell down on the floor. Did not hit anything, no pain and no injuries after that, but the next day in the morning he noticed that he is feeling more weak and he has chills. The weakness was not any focal weakness, not any loss of consciousness or dizziness. He felt overall more weak and chills were also there, so he thought it might be some viral illness. Was feeling overall uneasy. He stayed home, took rest, but did not get relieved enough so he decided to go to ER. On arrival to ER, his troponin is elevated and he has a BNP which is elevated too. He denies any complaint of chest pain, but he has extensive cardiac history and diabetes. Multiple bypass surgeries were done and stents were placed. He is a high risk and with elevated troponin, ER physician thought to let him get admitted. She also spoke to his primary cardiologist at [facility]. They were ready to accept him, but there was no bed available currently at [facility] so they advised to admit to our hospital, and they will let us know if a bed becomes available later on.   REVIEW OF SYSTEMS:  CONSTITUTIONAL: Negative for any measured fever. Positive for fatigue but no pain or weight loss.  EYES: No  blurring or double vision or discharge from the eyes.  ENT: No tinnitus, ear pain or hearing loss.  RESPIRATORY: He had some cough but denies any wheezing or shortness of breath. He said that he had 1 episode of yellowish sputum yesterday.  CARDIOVASCULAR: Denies any chest pain, orthopnea. He has leg edema, but it is chronic. He always has that on and off and he uses Lasix at home. He denies any syncopal episodes.  GASTROINTESTINAL: Denies any nausea, vomiting, abdominal pain, diarrhea.  GENITOURINARY: Denies any dysuria, hematuria, increased frequency of the urine.  ENDOCRINE: Denies any heat or cold intolerance.  SKIN: Denies any acne, rashes or lesions.  MUSCULOSKELETAL: Denies any swelling or tenderness of the joints.  NEUROLOGICAL: Denies any numbness, weakness, tremors or headache.  PSYCHIATRIC: Denies any anxiety, insomnia.   PAST MEDICAL HISTORY: Extensive for cardiac. Just to summarize, in [place] first time heart attack. In September 1991 he had quadruple bypass. After that, every few months one of the arteries was getting blocked and they had to go in and do ballooning, opening the artery. That continued for 4 to 5 times and finally they did some laser cutting of the blockage. He had multiple times heart attack, cardiac catheterization and tried ballooning with stenting. He also received a pacemaker in 1995, which was with defibrillator fire 2 times until its battery was dead after 7 years, and they changed and placed a new one and after that, he did not have any firing  of defibrillator. The last cardiac intervention was 4 years ago where he had 2 blockages and they put stents. After that also, he continues having on and off anginal episodes and using nitro and following with cardiologist.   Other medical history is diabetes, hypertension, neuropathy, gout, ejection fraction 15% as per him. For COPD, he uses BiPAP and oxygen at night and in the daytime on as-needed basis.   SOCIAL HISTORY: He  is an ex-smoker and he quit smoking cigarettes 2 years ago, but he has to use e-cigarette to have nicotine. He is an alcohol drinker, drinks 1 to 2 drinks every day but not a habitual drinker. Denies any illegal drug use. He is on disability for the last 6 years.   FAMILY HISTORY: Youngest sister died with colon cancer. Oldest brother had stroke and bypass and almost all the siblings had cardiac history and bypass surgeries for heart problems.   HOME MEDICATIONS: Advair Diskus 250 mg/50 mg 1 puff twice a day, alprazolam 1 mg 1 tablet by mouth 4 times a day, carvedilol 25 mg twice a day, lisinopril 20 mg once a day, multivitamin once a day, albuterol inhaler 2 puffs as needed, Plavix 75 mg by mouth once a day, fenofibrate 145 mg by mouth once a day, spironolactone 25 mg take 1/2 tablet once a day, allopurinol 300 mg tablet, aspirin 81 mg once a day, colchicine take 1 tablet by mouth once a day, mirtazapine 15 mg 1 tablet by mouth at bedtime, omeprazole 20 mg once a day, Spiriva 18 mcg 1 capsule by mouth inhalation, pravastatin 40 mg once a day, Celebrex 200 mg capsule by mouth, fluoxetine 40 mg by mouth once a day, metformin 1000 mg twice a day.  PHYSICAL EXAMINATION:  VITAL SIGNS: Temperature 96.3, pulse rate 70, respirations 22. Blood pressure on arrival to the ER was 94/51, which came up to 108/54. He is on 2 liters oxygen supplementation, saturating 98%.  GENERAL: Fully alert, oriented to time, place and person and cooperative with history taking and physical examination.  HEENT: Head and neck atraumatic. Conjunctivae pink. Oral mucosa moist.  NECK: Supple. No JVD.  RESPIRATORY: Bilateral clear and equal air entry. No crackles or rhonchi heard at this time.  CARDIOVASCULAR: S1, S2 present, regular. Left upper chest pacemaker present subcutaneously.  ABDOMEN: Soft, nontender. Bowel sounds present. No organomegaly.  SKIN: No rashes.  EXTREMITIES: Legs: Mild edema around the ankles present.   NEUROLOGICAL: Power 5/5. No gross abnormality appreciated.  PSYCHIATRIC: Appears fully cooperative and having good judgment and understanding of the situation.  LABORATORY, DIAGNOSTIC AND RADIOLOGICAL DATA: Glucose 160. BNP 2382. BUN 16, creatinine 1.09, sodium 130, potassium 4.0, chloride 99, CO2 of 28, total protein 6.8, albumin 3.4, SGOT 23, SGPT 21, alkaline phosphatase 40. Troponin level 0.24. WBC 11.3, hemoglobin 10.9, platelet count 160. Urinalysis grossly negative. X-ray of the chest portable is suggestive of atelectasis in right infrahilar region. No focal pneumonia or definitive evidence of CHF. EKG: The same as in the past with T wave and ST-T abnormalities.   ASSESSMENT AND PLAN: A 64 year old male with extensive cardiac history and multiple other medical history, came to the Emergency Room after feeling some chills and generalized weakness. Found having elevated troponin and as there is no bed available at [facility] with cardiologist agreeable to accept at [facility], we are admitting over here for further cardiac workup.  1.  Non-ST-elevation myocardial infarction: Troponin is 0.24. We will give him Lovenox therapeutic dose and call our cardiologist on  call for further management and plan. As we do not have any echocardiogram in our system, we would like to have 1 echocardiogram done and we will follow his serial troponins. If his troponin comes out to be stable at this point, that might be just because of his extensive cardiac history and congestive heart failure, then we might stop the Lovenox and continue him on his baseline cardiac medication. Currently we will continue all cardiac medications as he is taking at home.  2.  Chills and generalized weakness: This might be some viral infection. Currently he is not in acute distress and is feeling fine with that. X-ray chest does not show any suggestions of pneumonia and he does not have any extensive cough or symptoms of sepsis, so we will not start on  any antibiotics at this time.  3.  Hypertension: He is taking multiple cardiac medications including beta blocker, lisinopril and spironolactone for his cardiac status. Blood pressure is on the lower normal but stable. That might be because he has ejection fraction of 15% as he reported to me, so we will continue on his baseline medication what he is taking at home.  4.  Diabetes: Will continue him on baseline medication plus insulin with sliding scale coverage.  5.  Hyperlipidemia: Will continue him on pravastatin. 6.  Chronic obstructive pulmonary disease: He is not wheezing right now, so he is at his baseline this time. Will continue his CPAP at night and give his baseline nebulizer.  7.  Gout: W will continue his allopurinol. 8.  Currently using e-cigarette and he is asking to continue using that in the hospital, as he says that nicotine patch does not help him, so I will allow him to use his nicotine cigarettes, which is a vaporizer e-cigarette, while here in the hospital. Smoking cessation councelling done 5 min.  CODE STATUS: Full code.   ADDENDUM: Please note, this patient did not take any of his medication yesterday because he was feeling sick.   TOTAL TIME SPENT ON THIS ADMISSION: One hour.  ____________________________ Ceasar Lund Anselm Jungling, MD vgv:jm D: 08/07/2012 14:27:49 ET T: 08/07/2012 15:02:11 ET JOB#: [id number]  cc: Ceasar Lund. Anselm Jungling, MD, <Dictator> Vaughan Basta MD ELECTRONICALLY SIGNED 08/07/2012 22:26

## 2014-09-17 NOTE — H&P (Signed)
PATIENT NAME:  William Bray, William Bray MR#:  [id number] DATE OF BIRTH:  [birth year]  DATE OF ADMISSION:  04/23/2013  PRIMARY CARE PHYSICIAN: Juluis Pitch, MD  CARDIOLOGIST: [facility]; however, the patient has seen Dr. Ubaldo Glassing and Dr. Saralyn Pilar here in the past.  CHIEF COMPLAINT: Altered mental status. History is obtained from the patient's daughter, the patient, and the patient's wife.  HISTORY OF PRESENT ILLNESS: William Bray is a 64 year old Caucasian gentleman with extensive cardiac history and multiple interventions done in the past, most recent one done by Dr. Edwin Dada at [facility] about 1-1/2 to 2 months ago where he had another stent put in. The patient has undergone CABG x 4 in 1991 and thereafter had 2 stents placed. He comes to the Emergency Room after he was found confused by his family members. He has not been eating and drinking well for the past couple of days. Denies any fever or any unusual cough or any chest pain.  In the Emergency Room, the patient was found to be in hypercapnic hypoxic respiratory failure and currently during my evaluation is on BiPAP. He is feeling much better. His sats are 94% on BiPAP. He also was found to have dehydration with creatinine of 3.1 with normal creatinine 1.04 in March of 2014. The patient also has not been eating and drinking well. He was also noted to have elevated troponin of 3, although denies any true chest pain. He is being admitted for acute on chronic hypoxic hypercarbic respiratory failure, acute renal failure, and acute non-STEMI.   PAST MEDICAL HISTORY: 1.  COPD, quit smoking a few years ago. 2.  Depression.  3.  Peripheral neuropathy.  4.  Severe back pain with narcotic dependence.  5.  Chronic A-fib, on Coumadin.  6.  COPD, on home oxygen.  7.  Alcohol abuse in the past.  8.  Hyperlipidemia.  9.  Type 2 diabetes.  10.  Coronary artery disease status post status post CABG x 4 in 1991 and most recently 2 stents placed, most  recent one was about a month ago.  11.  Gout. 12.  GERD. 13.  CHF. 14.  History of pacemaker.  15.  Hernia repair.   ALLERGIES: No known drug allergies.   MEDICATIONS:  1.  Xanax 1 mg tablet 1/2 to 1 tablet 4 times a day as needed.  2.  Warfarin 3 mg 1-1/2 tablets 5 days a week and 2 tablets 2 days a week. 3.  Spironolactone 25 mg 1/2 tablet daily.  4.  Spiriva 18 mcg inhalation daily.  5.  Pravastatin 40 mg daily.  6.  Protonix 40 mg daily.  7.  Oxycodone 5 mg 1/2 to 1 tablet 4 times a day.  8.  Metformin 1000 mg b.i.d.  9.  Mag-Ox 400 mg 1 tablet daily.  10.  Lyrica 50 mg 3 times a day.  11.  Lisinopril 40 mg daily. 12.  Lasix 40 mg daily. 13.  Imdur 30 mg extended release p.o. daily.  14.  Fish oil p.o. daily.  15.  Fenofibrate 145 mg p.o. daily.  16.  Celebrex 200 mg daily.  17.  Carvedilol 25 mg b.i.d.  18.  Allopurinol 300 mg daily.  19.  Advair 250/50 one puff b.i.d.   SOCIAL HISTORY: Married, lives at home, ex-smoker, Market researcher. Denies any illegal drug use. He is on disability for the last 6 years.   FAMILY HISTORY: Younger sister died with colon cancer. Oldest brother had stroke and bypass. Almost  all siblings have cardiac history.   REVIEW OF SYSTEMS: CONSTITUTIONAL: No fever. Positive for fatigue, weakness, and confusion.  EYES: No blurred or double vision. No pain.  ENT: No tinnitus, ear pain, or hearing loss.  RESPIRATORY: No cough or wheeze. Positive for COPD and dyspnea.  CARDIOVASCULAR: No chest pain or orthopnea. Positive for dyspnea on exertion and hypertension.  GASTROINTESTINAL: No nausea, vomiting, diarrhea, abdominal pain, or GERD.  GENITOURINARY: No dysuria, hematuria, or frequency.  ENDOCRINE: No polyuria, nocturia, or thyroid problems.  HEMATOLOGY: No anemia or easy bruising.  SKIN: No acne, rash, or lesions.  MUSCULOSKELETAL: No arthritis. Positive for chronic back pain.  NEUROLOGIC: Positive for confusion. No dementia, CVA, or seizures.   PSYCHIATRIC: No anxiety. Positive for depression. No bipolar disorder. All other systems reviewed and negative.   PHYSICAL EXAMINATION: GENERAL: The patient is alert and oriented x 3. He is currently on BiPAP.  VITAL SIGNS: He is afebrile. Blood pressure is 109/57. Pulse ox is 94% on current BiPAP. Pulse is 75.  HEENT: Atraumatic, normocephalic. Pupils are equal, round, and reactive to light and accommodation. EOM intact. Oral mucosa is moist.  NECK: Supple. No JVD. No carotid bruit. RESPIRATORY: Distant breath sounds. I could not hear any wheezing. No respiratory distress or use of accessory muscles.  CARDIOVASCULAR: Both the heart sounds are normal. Rate and rhythm regular. PMI not lateralized. Chest nontender.  EXTREMITIES: Good pedal pulses. Good femoral pulses. No lower extremity edema.  ABDOMEN: Soft, benign, and nontender. No organomegaly. Positive bowel sounds.  NEUROLOGIC: Grossly intact cranial nerves II through XII. No motor or sensory deficit.  PSYCHIATRIC: The patient is awake, alert, and oriented x 3.  SKIN: Warm and dry.  LABORATORY AND DIAGNOSTICS: EKG shows electronic pacemaker, normal sinus rhythm.   PH is 7.27, pCO2 79, pO2 45, FiO2 21, bicarb 36.3.   Chest x-ray shows cardiomegaly, status post CABG. No active disease. CT of the head is essentially unremarkable.   White count is 10.2, H and H are 12.2 and 37.7, platelet count is 132, and MCV is 91. Glucose is 126, BUN 38, creatinine 3.10, sodium 136, potassium 4.6, chloride 97, bicarb 36, SGPT 87, and SGOT 172. Troponin is 3.0. Serum ethanol level is less than 0.003. PT and INR are 14 and 1.1. Activated PTT is 38.6.   ASSESSMENT: 64 year old [name] with history of coronary artery disease status post CABG and stent x 2 to 3, history of hypertension, diabetes, chronic obstructive pulmonary disease on home oxygen and BiPAP who comes into the Emergency Room with:  1.  Acute encephalopathy suspected due to chronic  obstructive pulmonary disease exacerbation with hypercarbic, hypoxic respiratory failure in combination with renal failure and dehydration. The patient will be admitted to the intensive care unit. Will monitor neuro checks q. 4 hours.  2.  Acute on chronic hypercarbic hypoxic respiratory failure due to chronic obstructive pulmonary disease exacerbation. The patient has chronic respiratory failure secondary to emphysema, ex-smoker, on home oxygen at home. Will continue IV Solu-Medrol around-the-clock along with inhalers and continue BiPAP and wean it off as the patient's symptoms improve. Will repeat ABG later today to ensure CO2 is coming down. Chest x-ray does not show any evidence of pneumonia. White count is stable. No indication for antibiotics at this time. Pulmonary consultation as needed. 3.  Acute non-Q-wave myocardial infarction in the setting of renal failure with elevated troponin and acute on chronic respiratory failure. The patient has strong and extensive history of coronary artery disease with  multiple interventions in the past, most recent done about 2 months ago by Dr. Edwin Dada at [facility]. The patient is already on heparin drip. I will continue that. The case was discussed with Dr. Nehemiah Massed who will see the patient. We will continue Coreg, statins, and aspirin for now.   4.  History of atrial fibrillation, on Coumadin. I will hold off on Coumadin since the patient is already on heparin drip. Will resume once heparin drip is turned off.  5.  Type 2 diabetes. We will continue sliding scale insulin. I will hold off on metformin if  cardiac catheterization is required.  6.  Acute renal failure. The patient appears to have dehydration from poor p.o. intake and medications. The patient is on lisinopril and Lasix as well. Will hold off Lasix and lisinopril at this time, continue IV fluids for hydration, ins and outs, and monitor metabolic panel. Nephrology consult as needed.  7.  Hyperlipidemia. On  fenofibrate and pravastatin. Will continue that.  8.  Chronic back pain with history of narcotic dependence. The patient is on Celebrex which I will hold in the setting of renal failure; however, I will continue his oxycodone, but watch respiratory status and mentation at the same time.  9.  Peripheral neuropathy. Continue Lyrica.  10.  Deep vein thrombosis prophylaxis. On heparin drip.   Further work up according to the patient's clinical course. Hospital admission plan was discussed with the patient, the patient's wife and daughter. The patient is a FULL CODE.  CRITICAL TIME SPENT: 60 minutes.  ____________________________ Hart Rochester Posey Pronto, MD sap:sb D: 04/23/2013 14:04:51 ET T: 04/23/2013 14:50:52 ET JOB#: [id number]  cc: Aadith Raudenbush A. Posey Pronto, MD, <Dictator> Ilda Basset MD ELECTRONICALLY SIGNED 04/24/2013 10:46

## 2014-09-17 NOTE — Discharge Summary (Signed)
PATIENT NAME:  William Bray, William Bray MR#:  [id number] DATE OF BIRTH:  Jan 03, 1951  DATE OF ADMISSION:  08/07/2012 DATE OF DISCHARGE:  08/08/2012  PRESENTING COMPLAINT: Weakness and chills.   DISCHARGE DIAGNOSES:  1.  Suspected viral syndrome; improved.  2.  History of coronary artery disease with cardiomyopathy, with ejection fraction of 20%.  3.  Elevated troponin, likely demand ischemia. No true acute coronary syndrome. 4.  Hyperlipidemia.  5.  Gastroesophageal reflux disease. 6.  History of coronary artery disease.   CODE STATUS: FULL CODE.   MEDICATIONS: 1.  Fluoxetine 40 mg p.o. daily.  2.  Remeron 15 mg at bedtime.  3.  Pravastatin 40 mg at bedtime.  4.  Mag-Ox 400 mg 2 tablets twice a day.  5.  Carvedilol 25 mg b.i.d.  5.  Plavix 75 mg daily.  6.  Celebrex 200 mg daily.  7.  Lasix 20 mg 1 to 2 tablets daily.  8.  Omeprazole 20 mg daily.  9.  Spiriva 18 mcg inhalation daily.  10.  Xanax 1 mg 1/2 tablet to 1 tablet 4 times a day as needed.  11. Zaleplon 10 mg at bedtime as needed.  12.  Advair Diskus 250/50 one puff b.i.d.  13.  Albuterol SVN 3 mL every 6 hours.  14.  Fentanyl 25 mcg 1 patch every 72 hours.  15. Metformin 1000 mg b.i.d.  16.  Allopurinol 300 mg daily.  17.  Lisinopril 20 mg daily.  18.  Oxycodone 5 mg 0.5 to 1 tablet 3 times a day.  19.  Hydromorphone  0.5 to 1 tablet 3 times a day.   DIET:  Regular diet.   FOLLOWUP:  1.  Follow up with Dr. Lovie Macadamia in 1 to 2 weeks.  2.  Follow up with [facility] on your scheduled appointment.   DIAGNOSTIC DATA:  Troponin is 0.14, 0.18, and 0.24.   B-type natriuretic peptide was 2382. Comprehensive metabolic panel within normal limits; sodium of 130 and glucose of 160. H and H are 10.9 and 33.5. Chest x-ray findings suggestive of atelectasis, right infrahilar region. No focal pneumonia noted. Cardiology consultation with Dr. Ubaldo Glassing.   BRIEF SUMMARY OF HOSPITAL COURSE: The patient is a 64 year old Caucasian gentleman  with a history of ischemic cardiomyopathy who comes into the emergency room with:  1.  Upper respiratory infection symptoms, with cold, chills, and no fever. The patient remained stable. Did not have any fever documented in the hospital. He was admitted on the telemetry floor. He remained in sinus rhythm. White cell count remained normal. The patient did not have any documented fever. Was feeling well.  2.  Extensive history of coronary artery disease with ischemic cardiomyopathy. Three sets of troponin remained less than 1. The patient denied any chest pain or shortness of breath. Does not appear to be true acute coronary syndrome. Dr. Ubaldo Glassing saw the patient. No further intervention was recommended. The patient will follow up with his cardiologist at [facility].  3.  Hypertension. Home medications were continued.  4. Gastroesophageal reflux disease. The patient is at baseline, doing well. He was discharged to home in a stable condition.   TIME SPENT: 40 minutes.     ____________________________ Hart Rochester Posey Pronto, MD sap:dm D: 08/11/2012 13:12:24 ET T: 08/11/2012 13:27:39 ET JOB#: [id number]  cc: Edan Juday A. Posey Pronto, MD, <Dictator> Javier Docker. Ubaldo Glassing, MD Youlanda Roys. Lovie Macadamia, MD  Ilda Basset MD ELECTRONICALLY SIGNED 08/23/2012 13:04

## 2014-09-17 NOTE — Discharge Summary (Signed)
PATIENT NAME:  William Bray, William Bray MR#:  [id number] DATE OF BIRTH:  [birth year]  DATE OF ADMISSION:  04/23/2013 DATE OF DISCHARGE:  04/28/2013  ADMITTING DIAGNOSIS: Acute encephalopathy due to his chronic obstructive pulmonary disease exacerbation.   DISCHARGE DIAGNOSES: 1.  Acute on chronic respiratory failure.  2.  Shock, septic and hypovolemic.  3.  Acute renal failure, resolved.  4.  Chronic obstructive pulmonary disease exacerbation on BiPAP at night.  5.  Left lower lobe pneumonia, resolving.  6.  Hepatic encephalopathy.  7.  Delirium due to Xanax withdrawal.  8.  Non-Q-wave myocardial infarction.  9.  Known coronary artery disease, status post coronary artery bypass grafting in the past, status post automatic implantable cardiac defibrillator as well as permanent pacemaker placement.  10.  Cardiomyopathy with ejection fraction of less than 20%.  11.  Elevated transaminases, likely liver cirrhosis.  12.  Anemia.  13.  Thrombocytopenia.  14.  History of chronic respiratory failure on oxygen therapy at home as needed.  15.  Depression.  16.  Peripheral neuropathy.  17.  Opiate dependent back pain.  18.  Chronic atrial fibrillation on Coumadin therapy.  19.  Hypertension.  20.  Hyperlipidemia.  21.  Systolic congestive heart failure, chronic.   DISCHARGE CONDITION: Stable.   DISCHARGE MEDICATIONS:  1.  The patient is to continue Pravachol 40 mg p.o. at bedtime.  2.  Carvedilol 25 mg p.o. twice daily.  3.  Celebrex 200 mg p.o. daily.  4.  Spiriva 18 mcg inhalation daily.  5.  Xanax 1 mg 3 times daily as needed.  6.  Advair discus 250/50 one puff twice daily.  7.  Metformin 1 gram twice daily.  8.  Allopurinol 300 mg p.o. daily.  9.  Oxycodone 5 mg 1/2 tablet to 1 tablet 4 times daily.  10.  Lasix 40 mg p.o. daily.  11.  Magnesium oxide 400 mg p.o. daily.  12.  Lyrica 50 mg 3 times daily.  13.  Pantoprazole 40 mg p.o. daily.  14.  Isosorbide mononitrate 30 mg p.o. daily.  15.   Warfarin 3 mg 1-1/2 tablets five days a week and 2 tablets twice a week.  16.  Spironolactone 25 mg 1/2 tablet once daily.  17.  Fenofibrate 145 mg p.o. daily.  18. Fish oil 1 capsule once daily.  19.  Prednisone 50 mg p.o. once on the 04/29/2013, then taper by 10 mg until stopped.   20.  Lisinopril 40 mg p.o. twice daily, this is a new dose.  21.  Aspirin 81 mg p.o. daily.  22.  Combivent Respimat 1 puff 4 times daily as needed.  23.  Amlodipine 5 mg p.o. daily.  24.  Lactulose 30 mL once daily.  25.  Levaquin 750 mg p.o. once daily for three more days.   HOME OXYGEN: Portable tank at 2 liters of oxygen per nasal cannula.   DIET: 2 grams salt, low fat, low cholesterol, carbohydrate-controlled diet, regular consistency.   ACTIVITY LIMITATIONS: As tolerated.    REFERRALS:  Outpatient physical therapy.     FOLLOWUP APPOINTMENT: With Dr. Lovie Macadamia in two days after discharge.   CONSULTANTS: Care management, social worker Dr. Nehemiah Massed, Dr. Mortimer Fries.    RADIOLOGIC STUDIES: Chest portable single view on 04/23/2013, revealed cardiomegaly and status post coronary artery bypass grafting. No active disease.   Repeated chest x-ray, portable 04/23/2013 after intubation, revealed appropriate positioning of endotracheal tube, cardiomegaly without congestive heart failure.   Repeated chest x-ray, portable single view,  04/24/2013 due to mechanical ventilation. No focal pneumonia was noted. Chronically increased lung markings at the right lung base line were changed from March 2013. Cardiac silhouette was normal in size but the pulmonary vascularity was not engorged. The pacemaker defibrillator and ET were unchanged in position.   Portable single view chest x-ray 04/25/2013, revealed ET tube terminates 5 cm above carina, mild prominence of right paratracheal stripe, medial right upper lobe favored to be positional.  Consider PA and lateral chest radiographs for further evaluation when possible.   PA and  lateral chest x-ray 04/25/2013 showed retrocardiac opacity best visualized in lateral view, suspicious for left lower lobe pneumonia.   Repeated chest x-ray, 04/28/2013, showed interim near complete clearing of retrocardiac infiltrate,  tiny right pleural effusion noted, coronary artery bypass graft was noted as well as cardiac pacer, but no congestive heart failure,   HOSPITAL COURSE:  The patient is a 64 year old Caucasian male with past medical history significant for history of alcohol abuse in the past. history of tobacco abuse, history of coronary artery disease, who presents to the hospital with complaints of altered mental status. Please refer to Dr. Gus Height Patel's admission note on 04/23/2013.  On arrival to the hospital, the patient was afebrile, his blood pressure was 109/57 pulse oximetry was 94% on BiPAP, pulse was 75. Physical exam revealed distant breath sounds, but no wheezing and no respiratory distress or use of accessory muscles. The patient's EKG showed electronic pacemaker, normal sinus rhythm. The patient's lab data done on admission showed elevation of BUN and creatinine to 38 and 3.10, glucose 126. Bicarbonate level was elevated at 36. Estimated GFR for non-African American was 20. The patient's ammonia level was elevated to 42. Alcohol level was less than 0.003%. Liver enzymes revealed AST and ALT 172 and 87 respectively. The patient's troponin was elevated at 3.7, the first set of 3.6, on the second set 3.5 and the third set was, MB fraction elevation to 30s to 40s. The patient's white blood cell count was normal at 10.2, hemoglobin was 12.1, platelet count 132. Coagulation panel revealed pro time of 14.0, INR was 1.1 and activated PTT was 38.6. Urinalysis was remarkable for 1 red blood cell, 3 white blood cells, trace bacteria. ABGs were done on 21% FiO2 and showed pH of 7.27, pCO2 was 79 and pO2 was 45, saturation was 74.5% on room air. The patient was admitted to the hospital for  further evaluation. He was initiated on BiPAP initially; however, he failed and his pH decreased to 7.22; however, his PCO2 increased to 92. He was intubated after that and ventilated, treating for chronic obstructive pulmonary disease exacerbation. It was felt that the patient's acute on chronic   respiratory failure was very likely due to chronic obstructive pulmonary disease exacerbation, pneumonia as well as likely altered mental status, hepatic encephalopathy. The patient was treated with steroids, antibiotic therapy and his condition improved. Unfortunately no sputum cultures were obtained from ET tube; however, the patient's chest x-ray showed improvement of his pneumonia. He is to continue antibiotics for three more days as well as steroid taper and inhalation therapy and to complete the course. In regards to shock, he was a severely hypotensive on arrival to the hospital after intubation. He required quite a lot of IV fluid administration. It was felt that the patient's shock was septic as well as hypovolemic. He was treated with IV fluids and his condition improved and his kidney function also normalized. On arrival to the hospital, the  patient's creatinine was 3.1 with an estimated GFR of 20. His kidney function normalized already by 04/26/2013 and his estimated GFR was more than 60, it is recommended to follow the patient's kidney function as outpatient and make decisions to refer him to nephrologist as needed, especially since he is on high doses of ACE inhibitors. In regards to hepatic encephalopathy, the patient is to continue lactulose. His ammonia level normalized to below 25 by the day of discharge. In regards to delirium, the patient was noted to be delirious, very confused. His Xanax was restarted and his delirium, resolved. It was very likely Xanax withdrawal delirium. In regards to non-Q wave myocardial infarction, the patient was evaluated and followed by cardiologist, who recommended to  continue conservative therapy and no other interventions were recommended. The patient underwent echocardiogram on 04/25/2013, which showed left ventricular ejection fraction of less than 20%, also severely decreased global left ventricular systolic function, severely increased left ventricular internal cavity size, mildly dilated left atrium as well as right atrium, severe mitral valve regurgitation, as well as severe tricuspid regurgitation, mildly elevated pulmonary arterial pressures as well as moderately increased left ventricular posterior wall thickness. The patient was given IV fluids while he was in the hospital; however, he did not become acutely short of breath due to congestive heart failure. He does have history of chronic systolic congestive heart failure; however, he did not have any exacerbations of this chronic systolic congestive heart failure during his stay in the hospital. He is, however, to resume his lisinopril as outpatient as well as diuretic Lasix. The patient's lisinopril dose was increased due to his significant elevation of blood pressure. It is recommended to follow the patient's blood pressure readings as outpatient and decrease his medications if needed. Regarding elevated transaminases, the patient's transaminases were elevated likely due to chronic underlying liver disease and we followed the patient's transaminases while he was in the hospital and they remained stable. We also ordered acute hepatic profile, which was negative. The patient was thrombocytopenic as well as anemic while he was in the hospital with dehydration. His hemoglobin level was found to be 12.1 on the day of admission, 04/23/2013. It remained however stable to 11.9 and 04/26/2013. His platelet count was low at 149 on the same date, 04/26/2013. It is recommended to follow patient's hemoglobin level as well as platelet count as outpatient to ensure its stability. It was felt that  platelet count as well as anemia  is likely due to dehydration as well as underlying liver cirrhosis. In regards to depression, peripheral neuropathy, opiate dependent, back pains, chronic atrial fibrillation, the patient is to resume his oral outpatient medications. For hypertension, as mentioned above, the patient's blood pressure medications were advanced to current levels. It is recommended to follow the patient's blood pressure readings very closely and make decisions about withdrawing some of his medications if blood pressure dropped down. I am worried that was rehydrated now and he may be diuresing over the next few weeks and his blood pressure may go down. In regards to hyperlipidemia, the patient is to continue his lipid management. For diabetes mellitus, the patient is to continue his diabetic medications. His kidney function normalized. However, it is recommended to follow the patient's creatinine to make sure that creatinine is adequate to continue metformin dosing. The patient is being discharged in stable condition with above-mentioned medications and follow-up. His vital signs on day of discharge: Temperature was 97.9, pulse was 61. Respiration was 18, blood pressure 144/79,  saturation was 92% on room air at rest as well as on exertion.   TIME SPENT: 40 minutes.   ____________________________ Theodoro Grist, MD rv:cc D: 04/28/2013 16:51:31 ET T: 04/28/2013 18:53:59 ET JOB#: [id number]  cc: Theodoro Grist, MD, <Dictator> Youlanda Roys. Lovie Macadamia, MD Theodoro Grist MD ELECTRONICALLY SIGNED 05/06/2013 16:11

## 2014-09-18 NOTE — Consult Note (Signed)
Details:   - GI Note:  LIver enzymes trending down. INR also trending down.  Still consistent with ischemic hepatopathy.    Recs: - cont to follow daily liver enzymes and INR.   Electronic Signatures: Arther Dames (MD)  (Signed 08-Dec-15 22:41)  Authored: Details   Last Updated: 08-Dec-15 22:41 by Arther Dames (MD)

## 2014-09-18 NOTE — Consult Note (Signed)
Details:   - GI Note:  Now intubated and sedated. Cannot obtain ROS.   Exam: intubated and sedated abd: distended, not tight, decreased bowel sounds  Labs: ast and ALT now > 2K and 4K respectively.  INR still 1.9  A/P:  Ischemic hepatopathy and multi organ failure.  Does also have evidence of cholecystitis on u/s so need to monitor that as possible source of sepsis.  - liver u/s with dopplers of hepatic vein, portal ven,  r/o bud Chiari, PVT - consider gallbladder drain if no other source of sepsis identified vs repeat imaging of g.b.  - will follow.   Electronic Signatures: Arther Dames (MD)  (Signed 05-Dec-15 17:49)  Authored: Details   Last Updated: 05-Dec-15 17:49 by Arther Dames (MD)

## 2014-09-18 NOTE — Consult Note (Signed)
   Comments   I met with pt's wife and daughter. Family felt patient was doing reasonably well at home prior to this acute illness. They recognize that his general health is poor. However, wife says patient is functionally independent and still active. He was pulling carpet in the home last week and participates 3 times a week in cardiac rehab, where he is able to walk 1.5 miles per day. 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  recognize that patient is critically ill and may need intubation. They would want this if needed. Patient was intubated last year and a few years prior to that. Each time he was extubated after a few days on the ventilator. Family has not talked with patient about his wishes for long term support if needed and he does not have a living will. They want him to remain a full code.  that per family report patient is on chronic opiates and BZD. He takes 61m daily of alprazolam. He also has a h/o BZD withdrawal. Would recommend avoiding abrupt cessation.  follow.  Electronic Signatures: Borders, JKirt Boys(NP)  (Signed 04-Dec-15 11:19)  Authored: Palliative Care Phifer, NIzora Gala(MD)  (Signed 04-Dec-15 17:05)  Authored: Palliative Care   Last Updated: 04-Dec-15 17:05 by Phifer, NIzora Gala(MD)

## 2014-09-18 NOTE — Consult Note (Signed)
   Present Illness The patient is a 64 year old gentleman with known history of coronary artery disease, ischemic cardiomyopathy, status post implantable cardiac defibrillator, chronic systolic congestive heart failure, and COPD who is admitted with chief complaint of shortness of breath. The patient has a history of sleep apnea and uses BiPAP at home. The patient reports a 1 week history of progressive exertional shortness of breath and shortness of breath at rest that has been refractory to nebulizer therapy. Presented to [facility] Emergency Room where he was noted to be hypoxic and hypercapnic with confusion.    No Known Allergies:   Electronic Signatures: Teodoro Spray (MD)  (Signed 04-Dec-15 12:53)  Authored: General Aspect/Present Illness, Allergies   Last Updated: 04-Dec-15 12:53 by Teodoro Spray (MD)

## 2014-09-18 NOTE — Consult Note (Signed)
Details:   - GI Note:  Still intubated.  Follows some commands.   Off pressors. No rectal bleeding.  Exam: vss intubated abd somewhat distended, decreased bowel sounds  Lab: AST still > 2000, ALT still > 4000  A/P.   Multi organ failure from sepsis.   The severe transamintis is almost certainly reactive / ischemic from severe CHF, sepsis  but will check viral hepatitis serologies, asma, ana, ceruloplasmin.  Doubt the benefits of lvier biopys would outweigh risks, given likely secondary, and chance of actionable diagnosis on biopsy very unlikely.   Electronic Signatures: Arther Dames (MD)  (Signed 07-Dec-15 18:21)  Authored: Details   Last Updated: 07-Dec-15 18:21 by Arther Dames (MD)

## 2014-09-18 NOTE — Consult Note (Signed)
PATIENT NAME:  William Bray, William Bray MR#:  [id number] DATE OF BIRTH:  02/20/1951  DATE OF CONSULTATION:  11/05/2013  REFERRING PHYSICIAN:  Loletha Grayer, MD CONSULTING PHYSICIAN:  Isaias Cowman, MD  CHIEF COMPLAINT: Shortness of breath.   HISTORY OF PRESENT ILLNESS: The patient is a 64 year old gentleman with known history of coronary artery disease, ischemic cardiomyopathy, status post implantable cardiac defibrillator, chronic systolic congestive heart failure, and COPD who is admitted with chief complaint of shortness of breath. The patient has a history of sleep apnea and uses BiPAP at home. The patient reports a 1 week history of progressive exertional shortness of breath and shortness of breath at rest that has been refractory to nebulizer therapy. Presented to [facility] Emergency Room where he was noted to be hypoxic and hypercapnic with confusion. Admission labs were notable for elevated BUN and creatinine of 38 and 2.11, respectively. Troponin was mildly elevated at 2.07. EKG was nondiagnostic. The patient was admitted to the CCU where he is currently on BiPAP and treated with nebulizer therapy, intravenous antibiotics and steroids. The patient denies chest pain.   PAST MEDICAL HISTORY: 1.  Status post coronary artery bypass graft surgery 02/20/1990 with LIMA to LAD, SVG to OM1, PDA in D1.  2.  Status post PTCA of SVG to PDA 12/17/1990 and 06/29/1991. 3.  Status post PTCA and stent of saphenous vein bypass graft to OM2 08/13/2002. 4.  Status post repeat PCI of SVG 02/15/2011. 5.  Status post stent SVG to OM2 01/2013. 6.  Ischemic cardiomyopathy, LVEF 35%. 7.  V. fib arrest during cardiac catheterization in 1993. 8.  Status post ICD 03/23/2003. 9.  COPD. 10.  Alcohol abuse. 11.  Obstructive sleep apnea, on BiPAP.  12.  Rheumatoid arthritis. 13.  Atrial fibrillation/flutter. The patient is currently on aspirin and clopidogrel.  14.  Hypertension. 15.  Diabetes. 16.  Hyperlipidemia.    MEDICATIONS: Aspirin 81 mg daily, clopidogrel 75 mg daily, fenofibrate 145 mg daily, isosorbide mononitrate 30 mg daily, lisinopril 40 mg daily, magnesium oxide 400 mg daily, pravastatin 40 mg daily, spironolactone 12.5 mg daily, carvedilol 25 mg b.i.d., albuterol inhaler 2 puffs q. 4 hours p.r.n., allopurinol 300 mg daily, alprazolam 1 mg t.i.d., Pulmicort b.i.d., Celebrex 200 mg daily, colchicine 0.6 mg daily p.r.n., diphenhydramine 50 mg q. 4 p.r.n., Prozac 20 mg daily, ipratropium albuterol 20/100 one puff q. 4 hours p.r.n., metformin 1000 mg b.i.d., mirtazapine 0.5 mg at bedtime, multivitamin 1 daily, pantoprazole 40 mg daily, pravastatin 40 mg q. p.m., Lyrica 50 mg t.i.d., Sonata 10 mg daily.   SOCIAL HISTORY: The patient has a 100 pack-year tobacco abuse history, quit 1/12. He currently lives with his wife.   FAMILY HISTORY: Mother died status post myocardial infarction at age 68.   REVIEW OF SYSTEMS: CONSTITUTIONAL: No fever or chills.  EYES: No blurry vision.  EARS: No hearing loss.  RESPIRATORY: Shortness of breath.  CARDIOVASCULAR: No chest pain.  GASTROINTESTINAL: No nausea, vomiting, or diarrhea.  GENITOURINARY: No dysuria or hematuria.  ENDOCRINE: No polyuria or polydipsia.  MUSCULOSKELETAL: No arthralgias or myalgias.  NEUROLOGICAL: No focal muscle weakness or numbness.  PSYCHOLOGICAL: No depression or anxiety.   PHYSICAL EXAMINATION: VITAL SIGNS: Blood pressure 102/63, pulse 57, respirations 15, temperature 97.5, pulse ox  98%.  HEENT: Pupils equal and reactive to light and accommodation.  NECK: Supple without thyromegaly.  LUNGS: Decreased breath sounds in both bases.  HEART: Normal JVP. Normal PMI. Regular rate and rhythm. Normal S1 and S2. No appreciable gallop,  murmur, or rub.  ABDOMEN: Soft and nontender. Pulses were intact bilaterally. There was 1+ bilateral pedal edema.  MUSCULOSKELETAL: Normal muscle tone.  NEUROLOGIC: The patient is alert and oriented x3. Motor  and sensory both grossly intact.   IMPRESSION: A 64 year old gentleman with complicated cardiac history status post bypass graft surgery, multiple PCI's, multiple stents with ischemic cardiomyopathy status post implantable cardiac defibrillator who presents with respiratory failure, likely multifactorial, secondary to chronic obstructive pulmonary disease and congestive heart failure. The patient has mildly elevated troponin which could very likely represent demand/supply ischemia and not due to acute coronary syndrome. The patient is currently chest pain free. EKG is nondiagnostic.   RECOMMENDATIONS: 1.  Agree with overall current therapy.  2.  Continue heparin drip for now. Would continue for 48 to 72 hours.  3.  Would defer early invasive cardiac catheterization in the absence of chest pain and ongoing respiratory failure.  4.  Further recommendations pending the patient's initial clinical course. ____________________________ Isaias Cowman, MD ap:sb D: 11/05/2013 12:49:43 ET T: 11/05/2013 13:27:06 ET JOB#: [id number]  cc: Isaias Cowman, MD, <Dictator> Isaias Cowman MD ELECTRONICALLY SIGNED 11/10/2013 8:30

## 2014-09-18 NOTE — Consult Note (Signed)
PATIENT NAME:  William Bray, SCHNICK MR#:  [id number] DATE OF BIRTH:  12-27-1950  DATE OF CONSULTATION:  05/06/2014  REFERRING PHYSICIAN:   CONSULTING PHYSICIAN:  Rodena Goldmann III, MD  PRIMARY CARE PHYSICIAN: Youlanda Roys. Lovie Macadamia, MD  ADMITTING PHYSICIAN:  Prime Doc  BRIEF HISTORY: William Bray is a 64 year old gentleman admitted to the hospital with multiple medical problems and developed multisystem organ failure. The surgical service is consulted presently for the possibility of a colonic ileus, possible cholecystitis. He was admitted with shortness of breath, decreasing pO2, ended up on the ventilator for several days, is now extubated and intermittently on BiPAP. His history is taken from the chart, as he is obtunded at the present time and cannot participate in his clinical evaluation and his family is not present. He has a history of severe cardiomyopathy with an ejection fraction of less than 20%. He has chronic obstructive lung disease with significant hypercapnia. He has a history of coronary artery disease and subsequent coronary artery bypass procedure. He has a pacemaker defibrillator. Since hospitalized, he has developed significant liver failure, renal failure. He developed some abdominal distention over the last 24 hours. Plain films revealed what appeared to be a colonic ileus which was confirmed with CT scanning. He did not have any evidence of low colonic obstruction. He did not have any evidence for small-bowel dilatation. The surgical service is consulted for possible ileus, possible cholecystitis, possible small-bowel obstruction. Patient does not have any history of bowel problems that we are aware of. He does not have a history of cholecystitis that we are aware of. Again, he is not available to participate mentally in the current evaluation. He had an abdominal ultrasound performed for his elevated liver function studies which revealed gallbladder wall thickening and pericholecystic fluid.  The surgical service was asked to address the possibility of acute cholecystitis. He did not have any evidence of cholelithiasis with that study. His medicines are well outlined on his current admission note. Of note is the fact that he continues to be on aspirin and Plavix at the present time.   PHYSICAL EXAMINATION:  GENERAL: He is briefly arousable but not participating in his examination or interview.  VITAL SIGNS: Blood pressure 140/60. His heart rate is 70. Oxygen saturation is 100% on 4 L. He is currently being dialyzed.  HEENT: Unremarkable. He has no scleral icterus, no pupillary abnormalities.  NECK: Supple. He does not demonstrate tenderness with motion. CHEST: Clear with decreased breath sounds but reasonable pulmonary excursion.  CARDIAC: Reveals no murmurs or gallops to my ear, but his heart sounds are quite distant.  ABDOMEN: Slightly distended, nontender, tympanitic, with no rebound, guarding and no hernias noted.  LOWER EXTREMITIES: Reveals marked edema and some mild anasarca.  PSYCHIATRIC: Reveals significant mental status deficiency from what is believed to be his increased ammonia level.   IMAGING: I independently reviewed his CT scan. There does not appear to be any evidence of obstruction on the CT scan. His colon is dilated all the way to the rectum. The small bowel is not dilated. He is passing gas. The nurses were performing an enema as I left his exam, and he passed a significant amount of gas with that enema.   LABORATORY DATA: His liver function studies reveal slightly elevated bilirubin but markedly elevated transaminases greater than 2000, strongly suggestive of liver insufficiency. I suspect liver insufficiency and his anasarca are responsible for his gallbladder wall findings rather than acute cholecystitis. In summary, I do not  see any surgical indication at the present time. He is a very poor surgical candidate for a number of reasons, and at the current time I do not  see any surgical problems. We will assist as necessary.   ____________________________ Micheline Maze, MD rle:ST D: 05/06/2014 17:56:56 ET T: 05/06/2014 22:54:43 ET JOB#: [id number]  cc: Micheline Maze, MD, <Dictator> Youlanda Roys. Lovie Macadamia, MD Rodena Goldmann MD ELECTRONICALLY SIGNED 05/08/2014 9:38

## 2014-09-18 NOTE — Consult Note (Signed)
Details:   - GI Note:  Liver enzymes still extremely elevated.  T.bili normal. INR down to 1.7.   A/P: Multi organ failure.  - will obtain u/s of gb to look for ongoing evidence of cholecystitis ( as casue of sepsis, not cause of elev liver enzymes).  - no addl recs currently.   Electronic Signatures for Addendum Section:  Arther Dames (MD) (Signed Addendum 06-Dec-15 17:11)  Recomend percutaneous G.B drain if u/s continues to show evidence of cholecystitis ( unless more likely source of infection can be identified quickly).   Electronic Signatures: Arther Dames (MD)  (Signed 06-Dec-15 17:04)  Authored: Details   Last Updated: 06-Dec-15 17:11 by Arther Dames (MD)

## 2014-09-18 NOTE — Consult Note (Signed)
Details:   - GI Note:  Now extubated, confused.    exam: vss confuse, no distress somwewhat distended abd. nt  Labs: liver enzymes trending down nicely, except t.bili up slighlty today.  Thiis still consistent with resolving ischemic hepatopathy.  INR nml which suggest good synthetic funtion.  Would not attribute any of the current problems such as ansasarca to the liver.   - INR and liver enzymes in am.   Electronic Signatures: Arther Dames (MD)  (Signed 11-Dec-15 16:28)  Authored: Details   Last Updated: 11-Dec-15 16:28 by Arther Dames (MD)

## 2014-09-18 NOTE — Consult Note (Signed)
General Aspect acute on chronic renal disease   Present Illness The patient is a 64 year old male with chronic hypercapnic respiratory failure on BiPAP at night and O2 p.r.n. in the daytime who presents with altered mental status. He was brought to [facility] due to altered mental status. By history, over this past week, the patient has not been feeling well. He has been complaining of back pain. She put a fentanyl patch on him and he had relief; however, that evening when she checked on him he was a little confused so she took the fentanyl patch off of him. He also was taking some p.o. pain medications at the same time. On the morning of admission he was found to be unresponsive. During his initial hospital course he has been admitted to the ICU.  He has required intubation and his rena failure has progressed.  He now requires CRT.  PAST MEDICAL HISTORY:  1.  COPD with chronic hypercapnic respiratory failure, wears BiPAP at night and O2 p.r.n.  2.  Congestive heart failure, cardiomyopathy, EF less than 20%.  3.  CAD and CABG. 4.  Diabetes. 5.  Essential hypertension. 6.  Hyperlipidemia.  7.  Chronic lower back pain.  8.  GERD.  9.  Anxiety and depression.   Home Medications: Medication Instructions Status  pravastatin 40 mg oral tablet 1 tab(s) orally once a day (at bedtime) Active  carvedilol 25 mg oral tablet 1 tab(s) orally 2 times a day Active  metformin 1000 mg oral tablet 1 tab(s) orally 2 times a day Active  allopurinol 300 mg oral tablet 1 tab(s) orally once a day Active  oxycodone 5 mg oral tablet 1 tab(s) orally 4 times a day, As Needed - for Pain Active  Lyrica 50 mg oral capsule 1 cap(s) orally 3 times a day Active  pantoprazole 40 mg oral delayed release tablet 1 tab(s) orally once a day Active  spironolactone 25 mg oral tablet 0.5 tab(s) orally once a day Active  isosorbide mononitrate 60 mg oral tablet, extended release 1 tab(s) orally once a day (in the morning) Active   multivitamin 1 tab(s) orally once a day Active  clopidogrel 75 mg oral tablet 1 tab(s) orally once a day Active  docusate-senna 50 mg-8.6 mg oral tablet 2 tab(s) orally once a day (at bedtime) Active  ProAir HFA CFC free 90 mcg/inh inhalation aerosol 2 puff(s) inhaled 4 times a day, As Needed - for Shortness of Breath, for Wheezing  Active  mirtazapine 30 mg oral tablet 0.5 tab(s) orally once a day (at bedtime) Active  albuterol-ipratropium 2.5 mg-0.5 mg/3 mL inhalation solution 3 milliliter(s) inhaled 4 times a day Active  ALPRAZolam 1 mg oral tablet 1 tab(s) orally 4 times a day, As Needed - for Anxiety. Active  aspirin 81 mg oral delayed release tablet 1 tab(s) orally once a day Active  FLUoxetine 20 mg oral capsule 3 cap(s) orally once a day (in the morning) Active  magnesium oxide 400 mg oral tablet 1 tab(s) orally 2 times a day Active  Anoro Ellipta 62.5 mcg-25 mcg/inh inhalation powder 1 puff(s) inhaled once a day Active  torsemide 20 mg oral tablet 1.5 tab(s) orally once a day Active  CeleBREX 200 mg oral capsule 1 cap(s) orally once a day Active  budesonide 0.5 mg/2 mL inhalation suspension 0.5 milligram(s) inhaled 2 times a day Active    No Known Allergies:   Case History:  Family History Non-Contributory   Social History negative tobacco, negative ETOH,  negative Illicit drugs   Review of Systems:  Medications/Allergies Reviewed Medications/Allergies reviewed   Physical Exam:  GEN well developed, critically ill appearing   NECK supple  trachea midline   RESP normal resp effort  no use of accessory muscles   CARD regular rate  positive JVD   ABD soft  nondistended   EXTR positive edema, pink upper extremities   SKIN No ulcers, skin turgor good   PSYCH poor insight, sedated   Nursing/Ancillary Notes: **Vital Signs.:   06-Dec-15 11:23  Temperature Temperature (F) 98.7  Celsius 37  Temperature Source oral    12:00  Pulse Pulse 60  Respirations Respirations  18  Systolic BP Systolic BP 87  Diastolic BP (mmHg) Diastolic BP (mmHg) 62  Mean BP 70  Pulse Ox % Pulse Ox % 100  Pulse Ox Activity Level  At rest  Oxygen Delivery Ventilator Assisted   LabObservation:  06-Dec-15 10:36   OBSERVATION PACS Image dcm.[id number].sa=[id number]  Hepatic:  06-Dec-15 14:03   Bilirubin, Total  1.2  Bilirubin, Direct  0.7  Alkaline Phosphatase 112 (46-116 NOTE: New Reference Range 12/15/13)  SGPT (ALT)  > 4000 (14-63 NOTE: New Reference Range 12/15/13)  SGOT (AST)  > 2000  Total Protein, Serum  5.7  Albumin, Serum  2.6  Routine Chem:  06-Dec-15 01:00   Magnesium, Serum  2.7 (1.8-2.4 THERAPEUTIC RANGE: 4-7 mg/dL TOXIC: > 10 mg/dL  -----------------------)  Phosphorus, Serum  5.4 (Result(s) reported on 02 May 2014 at 01:41AM.)  Creatinine (comp)  6.05  eGFR (African American)  12  eGFR (Non-African American)  10 (eGFR values <23m/min/1.73 m2 may be an indication of chronic kidney disease (CKD). Calculated eGFR, using the MRDR Study equation, is useful in  patients with stable renal function. The eGFR calculation will not be reliable in acutely ill patients when serum creatinine is changing rapidly. It is not useful in patients on dialysis. The eGFR calculation may not be applicable to patients at the low and high extremes of body sizes, pregnant women, and vegetarians.)    14:03   Result Comment AST/ALT - REPEATED AND CONFIRMED BY DILUTION  Result(s) reported on 02 May 2014 at 03:27PM.  Uric Acid, Serum  11.7 (Result(s) reported on 02 May 2014 at 02:46PM.)  Ammonia, Plasma  64 (Result(s) reported on 02 May 2014 at 02:46PM.)  Routine Coag:  06-Dec-15 14:03   Prothrombin  19.4  INR 1.7 (INR reference interval applies to patients on anticoagulant therapy. A single INR therapeutic range for coumarins is not optimal for all indications; however, the suggested range for most indications is 2.0 - 3.0. Exceptions to the INR Reference Range may  include: Prosthetic heart valves, acute myocardial infarction, prevention of myocardial infarction, and combinations of aspirin and anticoagulant. The need for a higher or lower target INR must be assessed individually. Reference: The Pharmacology and Management of the Vitamin K  antagonists: the seventh ACCP Conference on Antithrombotic and Thrombolytic Therapy. CZPHXT.0569Sept:126 (3suppl): [id number] A HCT value >55% may artifactually increase the PT.  In one study,  the increase was an average of 25%. Reference:  "Effect on Routine and Special Coagulation Testing Values of Citrate Anticoagulant Adjustment in Patients with High HCT Values." American Journal of Clinical Pathology 2006;126:400-405.)  Routine Hem:  06-Dec-15 01:00   WBC (CBC)  20.9  RBC (CBC) 4.44  Hemoglobin (CBC)  12.5  Hematocrit (CBC)  38.9  Platelet Count (CBC) 205  MCV 88  MCH 28.2  MCHC 32.1  RDW  16.1  Neutrophil % 90.9  Lymphocyte % 3.5  Monocyte % 5.4  Eosinophil % 0.1  Basophil % 0.1  Neutrophil #  19.0  Lymphocyte #  0.7  Monocyte #  1.1  Eosinophil # 0.0  Basophil # 0.0 (Result(s) reported on 02 May 2014 at 01:28AM.)    Impression 1.  Acute renal failure.  He is noted as being hypotensive. His renal status has deteriorated and he will need CRT  I will place a catheter to initiate treatment 2.  Hyperkalemia. is improved continue to monitor and will control with CRT  3.  Acute respiratory failure. The patient is currently intubated. Palliative care has also been consulted.  4.  Chronic systolic heart failure. The patient has a history of an ejection fraction of 20%.  cardiology on consult   Plan level 3 consult   Electronic Signatures: Hortencia Pilar (MD)  (Signed 06-Dec-15 16:35)  Authored: General Aspect/Present Illness, Home Medications, Allergies, History and Physical Exam, Vital Signs, Labs, Impression/Plan   Last Updated: 06-Dec-15 16:35 by Hortencia Pilar (MD)

## 2014-09-18 NOTE — Consult Note (Signed)
Present Illness William Bray  is a complicated 64 yr old male w/ hx of CAD s/p CABG and multiple PCIs (last 01/2013 SVG to OM2 graft), ischemic CM EF 35% s/p ICD, HTN, DM, HLD, pAF, COPD, OSA on bipap who was admitted with confusion and hypercapnea. He is followed at [facility] cardiology and pulmonary. He apparently was noted to have confusion and inability to stay awake by his wife. He was brought to the er where his ABG revealed severe respiratroy acidosis with pH of 7.19, pCO2 of 83, pO2 of 39. He was placed on bipap with some improvement. He currenly somnolent on bipap with pH of 7.29, pCO2 of 59 and pO2 of 255. He has mild troponin elevation to 1.29 with renal failure with creatinine of 3.33 and markedly elevated liver transaminases.CXR revealedno pulmonary edema.    PMH:  1. Ischemic cardiomyopathy with systolic dysfunction.  a. 02/2010 transthoracic Echo: EF 15%, severe global LV dysfunction, moderate left ventricular hypertrophy.  b. Exercise Cardiolite 05/09/2007 with infarct present in posterobasal, inferior, inferoapical and inferoseptal walls. All other walls are normal. There is no evidence of ischemia. EF is 23%. Septal dyskinesis with anterior inferior apical and lateral severe hypokinesis.  c. Stress Echo 12/18/2004: EF 35% Interpretation: abnormal stress echo. Resting wall motion abnormality improve with stress, indicating viable myocardium. Note: POOR IMAGE QUALITY W/USE OF CONTRAST;GRADE 1 DD;NOT ALL WALLS SEEN.  d. 03/23/2003. ICD implanted.  e. Level 1 CPX 04/05/2004 RER 1.01, peak C02 17.2 mm/Kg/min, 50% predicted peak C02. VE-VC02 slope 24.6.  f. PFT results: SVC 3.04 liters (78%), FEV1 1.90 liters (49%), FEV1/SVC 50%, MPV 58 liters/min (39%). Study was stopped due to leg and arm fatigue. Unable to interpret EKG due to conduction disturbance.  g. 02/2008, 2D echo: EF less than 15%, moderate to concentric LVH, LAE 5.4 cm., LVIDd 5.4 cm., LVIDs 4.8 cm., mildly dilated aortic root 3.8 cm.,  mild TR (peak RVP 44 mmHg.).  h. 06/2013 EF 35%, global HK, SWT 1.8cm, PWT 1.7cm, LVIDd 6.5cm, LVIDs 5.8 cm.   2. Coronary artery disease - (anginal equivalent symptoms: shortness of breath and bilateral arm discomfort).  a. Coronary artery bypass grafting 02/20/1990, SVG to OM1, SVG to PDA, SVG to D1, LIMA to LAD.  b. PTCA of SVG to RPDA 12/17/1990.  c. PTCA of SVG to RPDA 06/29/1991.  d. PTCA with laser and stent of saphenous vein graft to OM2, 08/13/2002.  e. Cardiac Cath 10/14/2002, 100% proximal RCA,100% mid left circ, 2 mid LAD lesions,100% 3 grafts patent.  f. August 2006, Adenosine nuclear stress test: infarct noted in the posterior basal inferior apical inferoseptal region. EKG negative. Post stress EF 33%.  g. December 2008, exercise nuclear stress tests: infart in posterior basal, inferior inferior apical and inferoseptal region, EKG positive for 0.19 mV in leads 2, AVF, V5 and V6, post stress EF 23%, septal dyskinesis with anterior, inferior, apical and lateral severe hypokinesis.  h. September 2012, exercise nuclear stress test: (results pending). i. SVG PCI, 01/2011 J. SVG to OM2 stent, DES, 01/2013 I. NM medicine stress- 09/2013- no evidence of ischemia, multiple areas of infarct in anterior and inferior walls.   3. History of ventricular fibrillation arrest during catheterization in 1993.  4. History of alcohol abuse.  a. Quit ETOH in 2008.  5. Erectile dysfunction.  6. Gout.  7. Prostate hypertrophy.  8. Obstructive sleep apnea.  a. Overnight pulse oximetry on 06/02/2002 showed significant desaturations below 90%. Currently using O2 at 2 liter per minute  during sleep.  b. Overnight sleep study 09/07/2003 no evidence of significant obstructive sleep apnea, however, test was limited due to only 97 minutes of sleep. Markedly reduced sleep efficiency.  c. Use of BIPAP at night.  9. Rheumatoid arthritis.  a. Chronic back pain, on celebrex and opiates 10. GI issues:  a.  Hemorrhoids.  b. Fatty liver. Liver biopsy 09/25/2005: negative for malignancy.  c. GERD.  11.Pa Atrial fibrillation/flutter.  a. April 2009, hospitalized for afib/flutter. DC cardioversion. Discharged on Warfarin.  b. Warfarin discontinued July 2012 at which time Plavix continued.  12. History of tobacco abuse.  a. Previously smoked one to three packs of cigarettes per day x forty years (120 ppy hx), quit smoking January 2012.  13. Hypertension.  14. Diabetes.  a. October 2011, HGB A1C 5.9%.  15. Dyslipidemia.  I. Hx of severe hyper-riglyceridemia, >2100 in 10/04, has been on Tricor since 2004.  a. April 2007, TC 323, TG 189, HDL 36, LDL nonmeasurable.  b. February 2009, TC 251, TG 257, HDL 30, LDL 169.  c. September 2010,TC 128, TG 129, HDL 32, LDL 70.  d. October 2011, TC 115, TG 75, HDL 40, LDL 61.  E. June 2015, TC 141, TG 152, HDL 42, LDL 68   Physical Exam:  GEN critically ill appearing   NECK No masses   RESP postive use of accessory muscles   CARD Regular rate and rhythm   ABD denies tenderness  normal BS   LYMPH negative neck   EXTR negative cyanosis/clubbing   SKIN normal to palpation   NEURO cranial nerves intact, motor/sensory function intact   PSYCH lethargic   Review of Systems:  ROS Pt not able to provide ROS   Medications/Allergies Reviewed Medications/Allergies reviewed   EKG:  EKG NSR   Abnormal LBBB    No Known Allergies:    Impression 74 L male with history of on the going cardiac problems status post Coronary artery bypass grafting an AICD placement with history of ischemic cardiomyopathy ejection fraction 15-20% as well as history respiratory failure, who was admitted with lethargy and confusion.  Noted to be profoundly hypercapnic and hypoxic with respiratory acidosis.  He was aggressively treated with BiPAP with improvement in his ABG.  He still remains somewhat hypercapnic but is oxygenating better.  He is not able to give a history.   He is closely followed at [facility] and much of the past history was taken for records done there.  He has a mild serum troponin elevation however of this appears to be demand ischemia in the face of acute on chronic renal insufficiency, severe hypercapnia and hypoxia.  He also has evidence of liver transaminase elevation.  Will need to aggressively treat his hypercapnia as is being  done done with CPAP.  Will also need evaluation of his liver transaminases and renal function.  Prognosis is poor.  Patient is not a candidate for invasive cardiac catheterization at this time to evaluate coronary anatomy.  Will continue to treat medically and follow.   Plan 1. Empiric treatment of his pulmonary status with BiPAP following ABGs 2. Closely follow for evidence of arrhythmia.  Patient has an AICD in place which is functioning normal 3. follow for evidence of congestive heart failure given his ischemic cardiomyopathy.  Patient currently does not appear to be in congestive heart failure clinically or by chest x-ray 4.   Electronic Signatures: Teodoro Spray (MD)  (Signed 04-Dec-15 13:31)  Authored: General Aspect/Present  Illness, History and Physical Exam, Review of System, EKG , Allergies, Impression/Plan   Last Updated: 04-Dec-15 13:31 by Teodoro Spray (MD)

## 2014-09-18 NOTE — H&P (Signed)
PATIENT NAME:  William Bray, DOLBERRY MR#:  [id number] DATE OF BIRTH:  09-08-1950  DATE OF ADMISSION:  04/30/2014  PRIMARY CARE PHYSICIAN: Juluis Pitch, MD  PRIMARY PULMONOLOGIST: Wallene Huh, MD  CHIEF COMPLAINT: Altered mental status.   HISTORY OF PRESENT ILLNESS: This is a 64 year old male with chronic hypercapnic respiratory failure on BiPAP at night and O2 p.r.n. in the daytime who presents with altered mental status. The patient is now on a BiPAP machine. He was brought in via EMS due to altered mental status. Wife is at bedside who provides HPI. Apparently, over this past week, the patient has not been feeling well. He has been complaining of back pain. She put a fentanyl patch on him and he had relief; however, that evening when she checked on him he was a little confused so she took the fentanyl patch off of him. He also was taking some p.o. pain medications at the same time. This morning she was unable to arouse him. He was very lethargic so she called her son who is paramedic and the patient was transported via EMS. He was placed on a BiPAP machine. He was given 500 normal saline bolus and nebulizers. He is currently on a BiPAP machine.   REVIEW OF SYSTEMS: The patient opens eyes and follows some commands, but at this point cannot provide an accurate review of systems.   PAST MEDICAL HISTORY: As per the wife: 1.  COPD with chronic hypercapnic respiratory failure, wears BiPAP at night and O2 p.r.n.  2.  Congestive heart failure, cardiomyopathy, EF less than 20%.  3.  CAD and CABG. 4.  Diabetes. 5.  Essential hypertension. 6.  Hyperlipidemia.  7.  Chronic lower back pain.  8.  GERD.  9.  Anxiety and depression.   PAST SURGICAL HISTORY:  1.  Pacemaker/defibrillator. 2.  CABG.  ALLERGIES: No known drug allergies.  MEDICATIONS: 1.  Albuterol 3 mL 4 times a day. 2.  Allopurinol 300 mg daily. 3.  Xanax 1 mg q. 8 hours p.r.n.  4.  Aspirin 81 mg daily.  5.  Budesonide 0.5 b.i.d.   6.  Coreg 25 mg b.i.d.  7.  Plavix 75 mg daily.  8.  Docusate 2 tablets at bedtime.  9.  Fenofibrate 145 mg daily.  10.  Fluoxetine 40 mg daily.  11.  Imdur 60 mg daily.  12.  Lasix 40 mg daily.  13.  Lisinopril 40 mg daily.  14.  Lyrica 50 mg t.i.d. p.r.n.  15.  Magnesium oxide 400 mg b.i.d.  16.  Metformin 1000 mg b.i.d.  17.  Remeron 15 mg at bedtime.  18.  Multivitamin daily.  19.  Oxycodone 5 mg 4 times a day p.r.n.  20.  Pantoprazole 40 mg daily.  21.  Pravastatin 40 mg daily.  22.  ProAir 2 puffs 4 times a day p.r.n.  23.  Spiriva 18 mcg daily.  24.  Spironolactone 25 mg 1/2 tablet daily.   SOCIAL HISTORY: The patient quit smoking about 4 years ago, quit drinking alcohol about 8 years ago.   FAMILY HISTORY: Positive for CAD.   PHYSICAL EXAMINATION: VITAL SIGNS: The patient's temperature is 97.6, pulse 69, respirations 20, blood pressure 95/47 and 99% on BiPAP.  GENERAL: The patient is arousable, opens his eyes and follows commands, but when not prompted he appears lethargic.  HEENT: Head is atraumatic. Pupils are round and reactive. Sclerae anicteric. Mucous membranes were not inspected as the patient has a BiPAP machine.  NECK: Short.  Hard to appreciate thyromegaly or enlarged lymph nodes. CARDIOVASCULAR: Regular rate and rhythm. No murmurs, gallops or rubs. PMI is hard to palpate due to body habitus. LUNGS: There are no crackles, rales, rhonchi, or wheezing. There is decreased breath sounds throughout, but no egophony is heard.  BACK: No CVA or vertebral tenderness.  ABDOMEN: Obese. Bowel sounds are positive. Nontender. Hard to appreciate organomegaly due to body habitus. No rebound or guarding.  EXTREMITIES: No clubbing, cyanosis or edema.  NEUROLOGIC: Cranial nerves II through XII are grossly intact. He is able to follow simple commands, and he moves all extremities.  SKIN: Without any rashes or lesions.   DIAGNOSTIC DATA: VBG: 7.190, CO2 is 83, pO2 is 39, on BiPAP  16/5.   White blood cells 20, hemoglobin 12, hematocrit 39, platelets 166,000. Sodium 137, potassium 5.9, chloride 94, bicarb 32, BUN 39, creatinine 3.33, glucose 144, calcium 8.4. Bilirubin 3.2, alk phos 87, ALT 3205, AST greater than 2000, total protein 6.9, albumin 3.3. Troponin 1.10.  Chest x-ray shows no acute cardiopulmonary disease.   EKG: Normal sinus rhythm with some ST depressions in the inferior leads.   ASSESSMENT AND PLAN: A 64 year old male who presented with hypercapnic respiratory failure. Subsequently, it appears that he has non-ST elevation myocardial infarction with acute renal failure and elevated liver function tests with multiorgan failure.  1.  Acute on chronic hypercapnic respiratory failure. At this point, the patient is following some commands. He does need to be intubated, but I would have low threshold to intubate this patient. I am having pulmonary, Dr. Raul Del, see the patient in consultation. Will continue BiPAP, steroids and nebulizers. Apparently, the patient has had many episodes of this and wears a BiPAP at night, predominantly due to his chronic hypercapnic respiratory failure.  2.  Sepsis with hypotension, unclear etiology. Chest x-ray does not show an infection, but he does have an elevated white blood cell count, he is hypotensive, and so therefore I have started empirically antibiotics and have requested some blood cultures. Will try to keep map greater than 65. He may need to be started on Levophed. We will try a very gentle hydration bolus with 500 mL of normal saline. Due to his low ejection fraction we need to be very careful.  3.  Acute renal failure. Looking back at old records it does not appear the patient has had any issues with kidney problems. This is an acute renal failure, likely due to acute tubular necrosis from respiratory failure. I will have consultation with nephrology. Hold any nephrotoxic agents. Consider a renal ultrasound if the creatinine  does not improve. Monitor I's and O's and daily weights.  4.  Elevated liver function tests. This is particularly concerning. His AST and ALT are very elevated. It does say that he was just taking oxycodone. I do not see a Tylenol component. I am going to check a Tylenol level. I will also obtain abdominal ultrasound. I will also obtain a gastroenterology consultation regarding his abnormal liver tests. I suspect he may have shock liver from hypotention.  5.  Non-ST-elevation myocardial infarction in the setting of acute on chronic hypercapnic respiratory failure, acute renal failure and elevated liver function tests. I will start the patient on a heparin drip, consult cardiology. He has seen [facility] cardiology in the past so will go ahead and consult them. Aspirin and statin has been started. We are avoiding beta blocker and nitroglycerin due to his hypotension.  6.  Type 2 diabetes with no  mention of complication. We will hold metformin. The patient is on sliding scale insulin for now.  7.  Chronic lower back pain. This does not seem to be an issue at this time. We will continue to monitor.  8.  History of gastroesophageal reflux disease. The patient is on pantoprazole at home.   The patient is a FULL code status.  The patient is critically ill with multiple organ failure. We will obtain a palliative care consult.   CRITICAL CARE TIME SPENT: Approximately 65 minutes. ____________________________ Donell Beers. Benjie Karvonen, MD spm:sb D: 04/30/2014 08:30:59 ET T: 04/30/2014 08:55:34 ET JOB#: [id number]  cc: Jossue Rubenstein P. Benjie Karvonen, MD, <Dictator> Donell Beers Shawnee Higham MD ELECTRONICALLY SIGNED 04/30/2014 13:11

## 2014-09-18 NOTE — H&P (Signed)
PATIENT NAME:  William Bray, William Bray MR#:  [id number] DATE OF BIRTH:  [birth year]  DATE OF ADMISSION:  11/04/2013  PRIMARY CARE PHYSICIAN:  Dr. Lovie Macadamia   PULMONOLOGIST: Dr. Raul Del  CARDIOLOGY: Does see cardiology at [facility], Dr. Edwin Dada, but also sees [facility] while here.   CHIEF COMPLAINT: Brought in with altered mental status.   HISTORY OF PRESENT ILLNESS: This is a 64 year old man with COPD.  He wears BiPAP at night. He has trouble breathing right. Monday, he was dizzy and groggy and that seemed to get worse and worse. He has been using his nebulizers and steadily getting worse. He has had headaches the last week. In the ER, he was found to be hypoxic and on ABG shown to have CO2 retention. Hospitalist services were contacted for acute on chronic hypoxic , hypercarbic respiratory failure.  The patient is on BiPAP and still with confusion. Patient also was found to have an elevated troponin and acute renal failure.   PAST MEDICAL HISTORY: Chronic obstructive pulmonary disease, wears BiPAP at night, has needed oxygen during the day, history of congestive heart failure with cardiomyopathy with ejection fraction less than 20%, coronary artery disease, diabetes, hypertension, hyperlipidemia, back pain, gastroesophageal reflux disease, and anxiety.   PAST SURGICAL HISTORY: Pacemaker defibrillator and CABG.   ALLERGIES: No known drug allergies.   MEDICATIONS: Include allopurinol 300 mg daily, aspirin 81 mg daily, Coreg 25 mg twice a day, Celebrex 200 mg daily, Plavix 75 mg daily, Colace/senna 50/8.6 two tablets at bedtime, fenofibrate 145 mg daily, fluoxetine 40 mg daily, Imdur 60 mg daily, Lasix 40 mg daily, lisinopril 40 mg daily, Lyrica 50 mg 3 times a day as needed, magnesium oxide 400 mg 1 tablet twice a day, metformin 1000 mg twice a day, Remeron 30 mg half tablet at bedtime, multivitamin 1 tablet daily, oxycodone 5 mg 4 times a day, Protonix 40 mg daily, pravastatin 40 mg at bedtime,  ProAir 2 puffs 4 times a day, Spiriva 1 inhalation daily, spironolactone 25 mg half tablet daily, Xanax 1 mg 4 times a day, zaleplon 10 mg at bedtime as needed.   SOCIAL HISTORY: Quit smoking 3 years ago. 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 No drug use. Worked as a Advertising account executive.   FAMILY HISTORY: Father was killed in an accident. Mother died of heart disease. Brother died of heart disease. Sister died of 3 cancers another sister died of heart and lung disease.   REVIEW OF SYSTEMS:  GENERAL:  No fever, chills or sweats. No weight loss. No weight gain.  EYES: Does wear reading glasses.  EARS, NOSE, MOUTH AND THROAT: Decreased hearing. No sore throat. No difficulty swallowing.  CARDIOVASCULAR: No chest pain. No palpitations.  RESPIRATORY: Positive for shortness of breath. Positive for cough. No sputum. No hemoptysis.  GASTROINTESTINAL: No nausea. No vomiting. No abdominal pain. No diarrhea. No constipation. No bright red blood per rectum. No melena.  GENITOURINARY: No burning on urination. No hematuria.  MUSCULOSKELETAL: Positive for back pain.  INTEGUMENT: No rashes but positive for itching all over.  PSYCHIATRIC: On medication for anxiety, depression.  ENDOCRINE: No thyroid problems.  HEMATOLOGIC AND LYMPHATIC: No anemia. No easy bruising or bleeding.  NEUROLOGICAL: No fainting or blackouts but altered mental status.   PHYSICAL EXAMINATION:  VITAL SIGNS: On presentation to the Emergency Room, included a pulse oximetry on room air 89%, blood pressure 90/46, respirations 14, pulse 70, temperature 98 degrees.  GENERAL:  At  this point, lying flat on BiPAP breathing comfortably.  EYES: Conjunctivae and lids normal. Pupils equal, round, and reactive to light. Extraocular muscles intact. No nystagmus.  EARS, NOSE, MOUTH AND THROAT: Tympanic membranes: No erythema. Nasal mucosa: No erythema.  THROAT: No erythema, no exudate seen.  LIPS AND GUMS: No  lesions.  NECK: No JVD. No bruits. No lymphadenopathy. No thyromegaly. No thyroid nodules palpated.   RESPIRATORY: Lungs decreased breath sounds bilaterally. No use of accessory muscles to breathe. Expiratory wheeze.  CARDIOVASCULAR: S1, S2 normal. No gallops, rubs, or murmurs heard. Carotid upstroke 2+ bilaterally. No bruits.  EXTREMITIES: Dorsalis pedis pulses 1+ bilaterally, 2+ edema bilateral lower extremities.  ABDOMEN: Soft, nontender. No organomegaly or splenomegaly. Normoactive bowel sounds. No masses felt.  LYMPHATIC: No lymph nodes in the neck.  MUSCULOSKELETAL: 2+ edema. No cyanosis on BiPAP.  PSYCHIATRIC: The patient is alert, oriented to person and place.  NEUROLOGICAL: Cranial nerves II through XII grossly intact. Deep tendon reflexes 1+ bilateral lower extremities.  SKIN: Bruise on the right abdomen, otherwise, no lesions.   LABORATORY AND RADIOLOGICAL DATA: Chest x-ray: Underlying emphysematous changes. No edema or consolidation. ABG showed a pH of 7.22, pCO2 of 87, pO2 of 87. That is on 35% oxygen. Bicarbonate 35.6, oxygen saturation 94.4. BNP 8924, glucose 99, BUN 38, creatinine 2.11, sodium 135, potassium 4.4, chloride 100, CO2 of 32, calcium 9.4. Liver function tests: AST slightly elevated at 57. Other liver function tests normal range. White blood cell count 8.3, hemoglobin and hematocrit 11.0 and 34.1, platelet count of 141,000.  INR 1.0. Troponin 2.0.  EKG: Normal sinus rhythm, right axis deviation, interventricular block, chronic ST elevation V1 through V3.   ASSESSMENT AND PLAN:  1. Acute on chronic hypoxic hypercarbic respiratory failure secondary to chronic obstructive pulmonary disease exacerbation. The patient is currently on BiPAP. He is a high risk for intubation with acute encephalopathy and acidosis.  I will recheck an ABG later on this evening and monitor mental status. Hopefully will not have to intubate, but I will watch him closely in the Intensive Care Unit. I  will treat chronic obstructive pulmonary disease exacerbation with IV Solu-Medrol 125 mg IV once and 60 mg IV q. 6 hours and nebulizer treatments and Levaquin given in the Emergency Room. We will also continue that on a daily basis.  2. Possible acute myocardial infarction. Troponin of 2. This could be a false-positive with the acute renal failure. Could also be a false-positive because the patient last time had a similar presentation and bumped his troponin even higher. We will start heparin drip, aspirin. Patient is on Plavix and Coreg.  3. Acute renal failure. We will give 1 liter of IV fluid. Hold lisinopril, Glucophage, spironolactone, and Lasix at this point in time.  4. Relative hypotension. We will hold spironolactone, Lasix, lisinopril. Give 1 liter of IV fluid and monitor closely. No signs of congestive heart failure at this point.  5. Diabetes. Hold metformin. Put on sliding scale.  6. Hyperlipidemia. Continue medications for cholesterol.  7. Gastroesophageal reflux disease on proton pump inhibitor.  8. Anxiety. We will decrease the Xanax dose to 0.5 mg q. 8 hours.  9. Back pain.  Continue oxycodone.  10. Since the patient takes BiPAP at home, I think this patient will be a good candidate for Trilogy.  We will have the care manager call one of the Trilogy representatives to switch the patient over for Trilogy as outpatient to try to avoid these hospitalizations.  TIME SPENT  ON ADMISSION: 60 minutes. The patient will be admitted to the CCU.    ____________________________ Tana Conch. Leslye Peer, MD rjw:dd D: 11/04/2013 18:02:31 ET T: 11/04/2013 18:25:49 ET JOB#: [id number]  cc: Tana Conch. Leslye Peer, MD, <Dictator> Youlanda Roys. Lovie Macadamia, MD Dr. Cari Caraway MD ELECTRONICALLY SIGNED 11/08/2013 12:44

## 2014-09-18 NOTE — Discharge Summary (Signed)
PATIENT NAME:  William Bray, William Bray MR#:  [id number] DATE OF BIRTH:  03/27/1951  DATE OF ADMISSION:  11/04/2013 DATE OF DISCHARGE:  11/07/2013  PRIMARY CARE PHYSICIAN: Dr. Lovie Macadamia.  FINAL DIAGNOSES:  1. Acute on chronic respiratory failure, hypoxic and hypercapnic.  2. Chronic obstructive pulmonary disease exacerbation.  3. Elevated troponin, likely demand ischemia rather than a myocardial infarction.  4. A history of congestive heart failure.  5. Hypertension.  6. Diabetes.  7. Hyperlipidemia.   MEDICATIONS ON DISCHARGE: Include pravastatin 40 mg at bedtime, Coreg 25 mg twice a day, Spiriva 18 mcg 1 inhalation daily, metformin 1000 mg twice a day, allopurinol 300 mg daily, oxycodone 5 mg 4 times a day as needed, Lasix 40 mg daily, Lyrica 50 mg 3 times a day as needed, Protonix 40 mg daily, spironolactone 12.5 mg daily, fenofibrate 145 mg daily, magnesium oxide 400 mg twice a day, Imdur 60 mg daily, aspirin 81 mg daily, multivitamin 1 tablet daily, Plavix 75 mg daily, fluoxetine 40 mg daily, lisinopril 40 mg daily, Colace/senna 50/8.6 two tablets at bedtime, ProAir HFA 2 puffs 4 times a day as needed, Remeron 30 mg half tablet at bedtime, Xanax 1 mg every 8 hours as needed for anxiety, prednisone 10 mg 5 tablets day one, 4 tablets day two, 3 tablets day three, 2 tablets day four, 1 tablet day 5, half tablet day 6 and 7, budesonide nebulizers 0.5 mg inhaled twice a day, Levaquin 250 mg once a day for 6 more days, albuterol ipratropium nebulizer 3 mL 4 times a day.  HOME OXYGEN: Yes, 1 liter nasal cannula.   DIET: Low-sodium, carbohydrate-controlled diet, regular consistency.   ACTIVITY: As tolerated.   FOLLOWUP AS OUTPATIENT: Pulmonary rehab 1-2 weeks Dr. Raul Del, 2-4 weeks Dr. Lovie Macadamia.   HOSPITAL COURSE: The patient was admitted 11/04/2013, discharged 11/07/2013. Came in with altered mental status, found to be in acute on chronic hypoxic hypercarbic respiratory failure secondary to COPD  exacerbation and put on BiPAP in the Emergency Room, started on high-dose Solu-Medrol 125 mg IV once and 60 mg IV q. 6 hours, and nebulizer treatments and Levaquin. The patient had an elevated troponin. As per the wife, this happened the last time he was in the hospital. It could be a false-positive with the acute renal failure. The patient was initially started on heparin drip, aspirin and already on Plavix and Coreg. For his acute renal failure, given IV fluids and nephrotoxic medications were held. For his relative hypotension, was given 1 liter of fluid, medications were held.   CONSULTANTS DURING THE HOSPITAL COURSE: Included Dr. Saralyn Pilar and Dr. Raul Del and physical therapy.   LABORATORY AND RADIOLOGICAL DATA DURING THE HOSPITAL COURSE: Included an EKG that showed normal sinus rhythm, rightward axis, nonspecific intraventricular block.   Troponin II 0.07. White blood cell count 8.3, H and H 11.0 and 34.1, platelet count of 141,000. Glucose 99, BUN 38, creatinine 2.11, sodium 135, potassium 4.4, chloride 100, CO2 of 32, calcium 9.4. Liver function tests: AST is slightly elevated at 57. Blood cultures are negative. ABG showed a pH of 7.22, pCO2 of 87, pO2 of 87, bicarbonate 35.6, oxygen saturation 94.4 and that was on 35% oxygen.   Chest x-ray underlying emphysematous changes. No edema or consolidation.   Troponin I 0.74, LDL 74, HDL 33, triglycerides 99. Upon discharge: Creatinine 1.13, last troponin was 1.4.   HOSPITAL COURSE PER PROBLEM LIST:  1. For the patient's acute on chronic respiratory failure, hypoxic and hypercarbic, initially required BiPAP  on presentation. Upon discharge he was breathing comfortably on room air. He does wear oxygen as needed at home and 1 liter at night, along with his BiPAP. This will be continued too at home. The patient was not a candidate for the Trilogy machine as per the care manager.  2. Chronic obstructive pulmonary disease exacerbation. I gave intravenous  Solu-Medrol 125 mg IV once and 60 q. 6, switched over to prednisone taper. Levaquin was given. Upon discharge only slight expiratory wheeze. Better air entry and then on presentation.  3. Elevated troponin, likely demand ischemia. The patient is on cardiac medications upon discharge including Coreg 25 mg twice a day, pravastatin 40 mg at bedtime, aspirin, and Plavix and also lisinopril for his cardiomyopathy.  4. History of congestive heart failure. No signs of congestive heart failure on this hospital stay. His Lasix and lisinopril were actually held and he was given IV fluids.  5. Hypertension. Blood pressure initially low on presentation, back in the normal range upon discharge.  6. Hyperlipidemia, on pravastatin. Lipid profile acceptable for heart disease. The patient LDL 74, HDL 33, would like to get that higher with exercise.   TIME SPENT ON DISCHARGE: 35 minutes.    ____________________________ Tana Conch. Leslye Peer, MD rjw:lt D: 11/07/2013 15:02:11 ET T: 11/08/2013 01:53:08 ET JOB#: [id number]  cc: Tana Conch. Leslye Peer, MD, <Dictator> Youlanda Roys. Lovie Macadamia, MD Herbon E. Raul Del, MD  Marisue Brooklyn MD ELECTRONICALLY SIGNED 11/08/2013 12:45

## 2014-09-18 NOTE — Discharge Summary (Signed)
PATIENT NAME:  William Bray, William Bray MR#:  [id number] DATE OF BIRTH:  [birth year]  DATE OF ADMISSION:  04/30/2014 DATE OF DISCHARGE:    Interim discharge summary was dictated by Dr. Margaretmary Eddy on December 14. My discharge summary covers hospital stay from December 14 to December 18. The patient will be discharged to [facility].   DISCHARGE DIAGNOSES: 1.  Acute on chronic hypercapnic respiratory failure due to severe cardiomyopathy, congestive heart failure, acute on chronic systolic congestive heart failure, ejection fraction less than 20%, status post automatic implantable cardiac defibrillator placement in the past. The patient was intubated December 5, extubated December 8.  2.  Sepsis with hypotension, likely pneumonia, completed course with antibiotic.  3.  Acute renal failure, possible acute tubular necrosis secondary to sepsis hypotension. The patient currently is on acute hemodialysis. PermCath placed. 05/13/2014.  4.  Elevated liver function tests with hepatic encephalopathy. Transaminitis secondary to overall appears asymptomatic in the setting of severe hypotension, cardiomyopathy, congestive heart failure, and sepsis. The patient does have history of fatty liver.  5.  Type 2 diabetes.  6.  N-STEMI, probable demand ischemia from multiple organ failure. Medical management.  7.  Chronic low back pain. Narcotic dependence.  8.  Chronic anxiety, depression.  9.  Thrombocytopenia, etiology remains unclear, likely due to sepsis. Heparin-induced thrombocytopenia antibody negative. 10.  Ileus/colonic distention, resolved.  11.  The patient is being discharged to [facility]. Renal diet.  12.  Continue hemodialysis according to Dr. Keturah Barre instructions as outpatient. The patient currently is on acute hemodialysis.  13.  Physical therapy.  14.  Followup care on Monday, 05/17/2014, at 10:30 a.m. with Dr. Candiss Norse.  LABORATORY DATA: Creatinine is 5.4, sodium is 140, potassium is 3.8, chloride is 100.  Hemoglobin and hematocrit is 8.4 and 26.8, platelet count is 40,000. White count is 11.7.  LFTs: Bilirubin is 1.2, SGPT 278. SGOT is 53. LDH is 282. HIT antibody is 0.380, which is negative.   MEDICATIONS AT DISCHARGE: 1.  Nystatin powder applied to affected area q.i.d. for 6 days.  2.  Procrit 20,000 units subcutaneous weekly at dialysis.  3.  Glucose chewable tablet 12 g orally as needed for blood sugar less than 60.  4.  Fluoxetine 60 mg daily.  5.  Allopurinol 100 mg daily.  6.  Carvedilol 25 mg b.i.d.  7.  Albuterol oral inhaler 2 puffs q.4 while awake.  8.  Protonix 40 mg daily.  9.  Multivitamin p.o. daily. 10.  Xanax 0.5 mg q.6 hours.  11.  Aldactone 12.5 mg p.o. daily with meals.  12.  Aspirin 81 mg daily.  13.  Oxycodone on 5 mg q.6 p.r.n.  14.  Pravastatin 40 mg at bedtime.  15.  Senokot 1 tablet p.o. b.i.d. p.r.n.  16.  Plavix 75 mg daily.  17.  Imdur 60 mg daily.  18.  Remeron 15 mg orally at bedtime.  19.  Sliding scale insulin.  BRIEF SUMMARY OF HOSPITAL COURSE: Mr. Enck is a 64 year old Caucasian gentleman with multiple medical problems, presented with hypercapnic respiratory failure, non-STEMI with acute renal failure and elevated liver function tests with multiorgan failure. He was admitted in the Intensive Care Unit with: 1.  Acute on chronic hypercapnic respiratory failure due to severe cardiomyopathy, CHF, acute on chronic systolic, known EF of less than 20%, status post AICD in the past. He was intubated December 5, extubated December 8. Had been on p.r.n.  now on oxygen 2 L/min. He was tapered with steroids, antibiotics,  IV Lasix was given. He is currently on beta blockers and spironolactone. ACE inhibitors have been held because ofeelvated creatinine.  2.  Sepsis with hypotension. The patient was treated for pneumonia, possible cholangitis, completed treatment with Zosyn for 10 days. The patient was seen by Dr. Ola Spurr. He remained afebrile. White count is  stable.  3.  Ileus, colonic distention, resolved after NG tube suction and surgical consultation was obtained. The patient is tolerating p.o. diet prior to discharge.  4.  Acute renal failure, possibly ATN due to multiorgan failure at admission. He was started on CRRT initially, which transitioned to hemodialysis and now has a PermCath, left-sided tunneled place on 05/13/2014 for acute dialysis. Determination for permanent hemodialysis will be based on the patient's outpatient labs and overall course as an outpatient, which will be followed by Dr. Candiss Norse.  5.  Elevated liver function tests. Hepatic encephalopathy/transaminitis secondary to possible acute acalculous cholecystitis versus ischemic  due to severe cardiomyopathy, hypertension, multiorgan failure. The patient was seen by GI, given supportive care. He was initially on Rifaximin and lactulose. Once his laboratories improved, both were discontinued. The patient has history of underlying fatty liver.  6.  Type 2 diabetes. He was on insulin and p.o. metformin, which have been discontinued. Sugar is on the lower side. Discontinued all p.o. diabetic medications. He is only on sliding scale insulin.  7.  N-STEMI from demand ischemia from multiorgan failure. The patient has severe cardiomyopathy a cardiology recommends medical management.  8.  Chronic low back pain, now on p.o. oxycodone.  9.  Chronic anxiety, depression. Continue Xanax. 10.  Physical therapy recommends rehabilitation. 11.  Overall, slow improvement; however, the patient will be discharged to [place] at this time with acute outpatient hemodialysis to determine if the patient has end-stage renal disease.  - discussed with the patient's wife, Callan Norden. The patient remained a full code.   TIME SPENT: 40 minutes.   ____________________________ Hart Rochester Posey Pronto, MD sap:sw D: 05/14/2014 09:38:56 ET T: 05/14/2014 11:35:35 ET JOB#: [id number]  cc: Jupiter Kabir A. Posey Pronto, MD,  <Dictator> Ilda Basset MD ELECTRONICALLY SIGNED 05/21/2014 17:07

## 2014-09-21 DIAGNOSIS — Z95811 Presence of heart assist device: Secondary | ICD-10-CM | POA: Insufficient documentation

## 2014-09-22 NOTE — H&P (Signed)
PATIENT NAME:  William Bray, William Bray MR#:  [id number] DATE OF BIRTH:  December 15, 1950  DATE OF ADMISSION:  05/29/2014  PRIMARY CARE PHYSICIAN: Youlanda Roys. Lovie Macadamia, MD   CHIEF COMPLAINT: Sent in for unresponsiveness.   HISTORY OF PRESENT ILLNESS: This is a 64 year old man who recently had a long hospital course from December 4 through December 18 which included acute on chronic respiratory failure, being on the ventilator, cardiomyopathy, EF of 20% with congestive heart failure, sepsis, possible cholangitis, acute respiratory failure, acute renal failure on dialysis, non STEMI, elevated liver function tests. He was sent out to rehabilitation on December 18. They removed the dialysis catheter on 05/26/2014, because his kidney function had improved. As per family, he was doing well since the discharge. They were talking about coming home from the rehabilitation soon, but yesterday he did have a little shortness of breath with exertion. He has been having decreased appetite and not eating very well. He had his normal back pain and took some oxycodone yesterday, which helped. He has had some swelling in his feet. This morning he ate breakfast and then became unresponsive, was sent to the ER for further evaluation. In the ER he was found to be hypoxic and was intubated. Hospitalist services were contacted for further evaluation. The patient unable to give any history secondary to being on the ventilator.   PAST MEDICAL HISTORY: COPD, coronary artery disease, congestive heart failure systolic in nature, diabetes, chronic kidney disease stage IV, hypertension, hyperlipidemia, atrial flutter with ablation, depression, a ventricular fibrillation arrest in the past, fatty liver, questionable history of cirrhosis, anemia, thrombocytopenia, and gout.  PAST SURGICAL HISTORY: Pacemaker and defibrillator, CABG, hernia repair.   ALLERGIES: No known drug allergies.   MEDICATIONS: Include albuterol nebulizer every 3 hours as needed  for shortness of breath, DuoNeb nebulizer every 6 hours, allopurinol 100 mg daily, Xanax 0.5 mg every 6 hours for anxiety, aspirin 81 mg daily, Coreg 25 mg twice a day, Plavix 75 mg daily, Epogen 20,000 units once a week, fluoxetine 60 mg daily, furosemide 40 mg daily, glucose 4 grams chewable 3 tablets every hour as needed for hypoglycemia, Imdur 60 mg daily, Remeron 15 mg at bedtime, multivitamin 1 tablet daily, NovoLog sliding scale, oxycodone 5 mg every 6 hours as needed for severe pain, Protonix 40 mg daily as needed for GERD, pravastatin 40 mg daily, ProAir 2 puffs every 4 hours as needed for shortness of breath, senna 8.6 mg every 12 hours as needed for constipation, spironolactone 12.5 mg once a day.   SOCIAL HISTORY: Currently at [facility] rehabilitation. Quit smoking 5 years ago. Alcohol, stopped 8 to 9 years ago. No drug use. Used to work as a Administrator, a Horticulturist, commercial, and Company secretary.   FAMILY HISTORY: Father died of an accident. Mother died of COPD.   REVIEW OF SYSTEMS: Unable to obtain secondary to patient being on the ventilator.   PHYSICAL EXAMINATION: VITAL SIGNS: On presentation included a temperature that could not be obtained initially, then 92, pulse oximetry 61, respirations 13, blood pressure 97/64, pulse was then oximetry 100% on the ventilator.  GENERAL: Positive respiratory distress on the ventilator, probably secondary to not being sedated enough.  EYES: Conjunctivae and lids normal. Pupils dilated, slightly reactive to light. Unable to test extraocular muscles.  EARS, NOSE, MOUTH AND THROAT: Tympanic membranes blocked by wax. Nasal mucosa, no erythema. Throat difficult to examine on the ventilator.  NECK: No JVD. No bruits. No lymphadenopathy. No thyromegaly. No thyroid nodules  palpated.  RESPIRATORY: Poor air entry bilaterally. Positive use of accessory muscles to breathe. Rales at the base.  CARDIOVASCULAR SYSTEM: S1, S2 normal. No gallops, rubs, or murmurs  heard. Carotid upstroke 2+ bilaterally. No bruits.  EXTREMITIES: Dorsalis pedis pulses 1+ bilaterally, 3+ edema bilateral lower extremities.  ABDOMEN: Soft and nontender. No organomegaly/splenomegaly. Normoactive bowel sounds. No masses felt.  LYMPHATIC: No lymph nodes in the neck.  MUSCULOSKELETAL: With 2+ edema. No clubbing. No cyanosis.  SKIN: No rashes or ulcers seen.  NEUROLOGIC: Cranial nerves unable to be tested secondary to altered mental status.  PSYCHIATRIC: Unable to be tested secondary to altered mental status.   LABORATORY AND RADIOLOGICAL DATA: INR 1.2, PT 15.4, TSH 0.45. Troponin borderline at 0.08. White blood cell count 12.3, hemoglobin 8.9, hematocrit 30.9, platelet count of 114,000. Ethanol less than 3. Glucose 165, BUN 27, creatinine 2.5, sodium 139, potassium 4.9, chloride 96, CO2 of 39, calcium 8.6. Liver function tests normal range. ABG showed a pH of 7.32, pCO2 of 78, pO2 of 297; that was on 60% oxygen. Bicarbonate 40.2. On assist control on the ventilator. Chest x-ray: Endotracheal tube approximately 5 cm above the carina. CT scan of the head: No acute finding on CT. Normal for patient's age.   ASSESSMENT AND PLAN: 1.  Acute respiratory failure with hypoxic presentation and hypercapnia on ABG. The patient currently on the ventilator. Will recheck an ABG in a couple of hours in the Critical Care Unit and ventilator adjustments will be based on that. I spoke with Dr.  Devona Konig, pulmonary, to evaluate the patient. Continue ventilator support at this point.  2.  Acute encephalopathy. Could be secondary to hypoxia. With his questionable history of cirrhosis I will check an ammonia level, which was ordered. Based on that finding, I will determine whether or not to give lactulose or not. Continue Plavix at this point.  3.  Systemic inflammatory response syndrome with hypothermia, hypotension, and leukocytosis. Blood cultures sent off. Will send off a urine culture. Will give  aggressive antibiotics at this point. Since the patient recently had a dialysis catheter removed, it could be line sepsis. Unclear source of infection at this point.  4.  Elevated troponin and history of coronary artery disease. I think this is demand ischemia. Continue Plavix at this point. Get serial cardiac enzymes, put on telemetry. With the initial hypotension, hold off on beta blocker at this point.  5.  Chronic obstructive pulmonary disease exacerbation and poor air entry. Start high-dose Solu-Medrol and nebulizer treatments and continue to monitor.  6.  History of congestive heart failure, systolic in nature. I think this is more respiratory related. I will continue once a day Lasix since we are giving him IV fluids with antibiotics and sedation, but I do not think this is overt congestive heart failure at this point.  7.  Diabetes, put on sliding scale.  8.  Chronic kidney disease stage IV, continue to monitor.  9.  Hyperlipidemia, on statin.  10.   Depression.  11.   Anemia and thrombocytopenia, need to watch closely while here in the hospital.  12.   Gout, renally dosed allopurinol.  13.   Gastroesophageal reflux disease, on Protonix at home. We will continue Protonix here.   TIME SPENT ON ADMISSION: 65 minutes now critical care time.    ____________________________ Tana Conch. Leslye Peer, MD rjw:at D: 05/29/2014 11:04:58 ET T: 05/29/2014 11:26:25 ET JOB#: [id number]  cc: Tana Conch. Leslye Peer, MD, <Dictator> Youlanda Roys. Lovie Macadamia, MD Tana Conch  Leslye Peer MD ELECTRONICALLY SIGNED 06/06/2014 15:14

## 2014-09-22 NOTE — Op Note (Signed)
PATIENT NAME:  William Bray, LUNNEY MR#:  [id number] DATE OF BIRTH:  1950-07-13  DATE OF PROCEDURE:  05/13/2014  PREOPERATIVE DIAGNOSES: 1.  End-stage renal disease.  2.  Atrial fibrillation.  3.  Diabetes.  4.  Hyperlipidemia.   POSTOPERATIVE DIAGNOSES: 1.  End-stage renal disease.  2.  Atrial fibrillation.  3.  Diabetes.  4.  Hyperlipidemia.   PROCEDURES: 1.  Placement of left jugular PermCath through previous venous access.  2.  Fluoroscopic guidance for placement of catheter.   SURGEON:  Algernon Huxley, MD   ANESTHESIA:  Local with moderate conscious sedation.   BLOOD LOSS:  50 mL.   INDICATION FOR PROCEDURE:  This is a 64 year old gentleman who was admitted several days prior. He had a temporary catheter placed for acute-on-chronic renal failure. At this point, he remains in renal failure and appears to have progressed to end-stage renal disease. For this reason, a PermCath is needed. Risks and benefits are discussed. Informed consent is obtained.   DESCRIPTION OF PROCEDURE:  The patient is brought to the vascular suite. The left neck, chest, and existing temporary catheter were prepped and draped, and a sterile surgical field was created. I removed the existing catheter over a wire, locally anesthetized the area, and I anesthetized an area on the left clavicular region where the catheter would exit. I placed a peel-away sheath and initially tunneled a 23 cm tip-to-cuff catheter. I tried to place this through the peel-away sheath, but this would not track down and was not long enough, stopping in the innominate vein. I then replaced a wire using an Advantage wire this time, selected a longer catheter using fluoroscopic guidance, and tunneled this over the wire through the subclavicular incision, through a peel-away sheath, which was then removed, over a wire, parking the catheter into the right atrium at its tip. This was a 28 cm tip-to-cuff Palindrome catheter. Both lumens withdrew blood  well and flushed easily with heparinized saline, and a concentrated heparin solution was placed. It was secured to the chest wall with 2 Prolene sutures. The access incision was closed with a single 4-0 Monocryl, and a single 4-0 Monocryl was placed around the catheter exit site as pursestring suture. Sterile dressing was placed. The patient tolerated the procedure well and was taken to the recovery room in stable condition.      ____________________________ Algernon Huxley, MD jsd:nb D: 05/13/2014 16:55:41 ET T: 05/14/2014 03:09:57 ET JOB#: [id number]  cc: Algernon Huxley, MD, <Dictator> Algernon Huxley MD ELECTRONICALLY SIGNED 05/31/2014 14:11

## 2014-09-22 NOTE — Consult Note (Signed)
PATIENT NAME:  William Bray, William Bray MR#:  [id number] DATE OF BIRTH:  11-Jun-1950  DATE OF CONSULTATION:  04/30/2014  REFERRING PHYSICIAN:   CONSULTING PHYSICIAN:  Arther Dames, MD  REFERRING PHYSICIAN:  Dr. Bettey Costa.    REASON FOR CONSULTATION: Elevated liver enzymes.   HISTORY OF PRESENT ILLNESS: William Bray is a 64 year old male with a past medical history notable for COPD, congestive heart failure, coronary artery disease, who presented to the Emergency Room for evaluation of altered mental status. At this time he is unable to give any history, some of this history is per his wife.   In the hospital he was found to be hypotensive and also appeared to be septic with an unknown etiology. He has been treating with broad-spectrum antibiotics and has also been requiring BiPAP.   In the setting of being admitted to the hospital it was also noted that he had very elevated liver enzymes up to 2000.   Per the wife he has not previously had trouble with his liver that she is aware of. He has not been told of having elevated liver numbers. He does not drink alcohol.   PAST MEDICAL HISTORY:  1.  COPD.   2.  CHF, EF of 20%.  3.  CAD and CABG.  4.  DM.   5.  Hypertension.  6.  Hyperlipidemia.  7.  GERD.  8.  Anxiety.  9.  Depression.   PAST SURGICAL HISTORY:  1.  AICD.   2.  CABG.   ALLERGIES: NKDA.   HOME MEDICATIONS:  Albuterol, allopurinol, Xanax, aspirin, budesonide, Coreg, Plavix, docusate, fenofibrate, fluoxetine, Imdur, Lasix, lisinopril, Lyrica, magnesium oxide, metformin, Remeron, multivitamin, oxycodone, pantoprazole, pravastatin, ProAir, Spiriva, spironolactone.   SOCIAL HISTORY: Unable to obtain, per the wife no alcohol or tobacco.   FAMILY HISTORY: No GI malignancy or liver disease per the wife.    REVIEW OF SYSTEMS: Unable to obtain due to altered mental status.   PHYSICAL EXAMINATION:  VITAL SIGNS: Currently temperature is 98. Pulse is 70. Respirations are 19. Blood  pressure 107/62.  97% on room air.  GENERAL: Alert and oriented times 0.  He is somewhat arousable.   HEENT: Normocephalic/atraumatic. Extraocular movements are intact. Anicteric. On BiPAP machine.   NECK: Soft, supple. JVP appears normal. No adenopathy. CHEST: Coarse breath sounds bilaterally.   HEART: Regular. No murmur, rub, or gallop.  Normal S1 and S2. ABDOMEN: Soft, nontender, nondistended.  Normal active bowel sounds in all four quadrants.  No organomegaly. No masses EXTREMITIES: No swelling, well perfused. SKIN: No rash or lesion. Skin color, texture, turgor normal. NEUROLOGICAL: Grossly intact. PSYCHIATRIC: Normal tone and affect. MUSCULOSKELETAL: No joint swelling or erythema.   LABORATORY DATA: Sodium 137, potassium 5.9, BUN 39, creatinine 3.33. Liver enzymes, total bilirubin 3.2, alkaline phosphatase 87, AST greater than 2000, ALT is 3200. Troponin is 1.10. CK-MB is 14.2. White count is 19, hemoglobin 11, hematocrit 38, platelets are 166,000. INR is 2.0. Blood gas 7.19 was the pH. He did have a liver ultrasound that did show gallbladder wall thickening and edema suggestive of cholecystitis and hepatic steatosis with a small amount of ascites.   ASSESSMENT AND PLAN:  Elevated liver enzymes: This overall presentation is very much consistent with ischemic hepatopathy. It does seem that he was hypotensive when he presented and also in the hospital requiring pressors. He also has what appears to be an ongoing sepsis. He also has multiple other severe medical comorbidities which are likely contributing including congestive heart failure  with poor ejection fraction and hypoxia.   RECOMMENDATIONS:  1.  Continue to follow liver enzymes closely.  2.  Daily INR.  3.  Avoid hepatotoxic medications.   4.  Attempt to maintain normotensive blood pressure.  5.  We will continue to follow. Thank you for this consult.     ____________________________ Arther Dames, MD mr:bu D: 04/30/2014  17:20:40 ET T: 04/30/2014 19:23:48 ET JOB#: [id number]  cc: Arther Dames, MD, <Dictator> Mellody Life MD ELECTRONICALLY SIGNED 05/31/2014 14:18

## 2014-09-22 NOTE — Consult Note (Signed)
PATIENT NAME:  William Bray, William Bray MR#:  [id number] DATE OF BIRTH:  1951/01/31  DATE OF CONSULTATION:  04/30/2014  REFERRING PHYSICIAN:  Sital P. Benjie Karvonen, MD CONSULTING PHYSICIAN:  Blanche Scovell Lilian Kapur, MD  REASON FOR CONSULTATION: Acute renal failure.   HISTORY OF PRESENT ILLNESS: The patient is a pleasant 64 year old Caucasian male with a past medical history of COPD, chronic systolic heart failure with an ejection fraction less than 20%, coronary artery disease with a history of CABG, diabetes mellitus, hypertension, hyperlipidemia, chronic lower back pain, GERD, anxiety and depression, who presented to [facility] with an altered mental status. The patient was admitted to the hospitalist service. Upon our evaluation, the patient was unable to provide any history. We were consulted for evaluation and management of acute renal failure. The patient's baseline creatinine is noted as being 1.1 from 11/06/2013. Upon presentation, creatinine was 3.3 with an elevated potassium of 5.9. He does have acute respiratory failure and is currently on BiPAP. He also appears to have some element of liver dysfunction with a slightly elevated bilirubin of 3.2. However, AST was quite high at greater than 2000, and ALT was 3205.   PAST MEDICAL HISTORY:  1.  COPD with history of chronic hypercapnic respiratory failure, wearing BiPAP at night.  2.  Congestive heart failure with an ejection fraction less than 20%.  3.  Coronary artery disease with history of CABG.  4.  Diabetes mellitus.  5.  Hypertension.  6.  Hyperlipidemia.  7.  Chronic lower back pain.  8.  GERD.  9.  Anxiety and depression.   ALLERGIES: No known drug allergies.   CURRENT INPATIENT MEDICATIONS:  Include:  1.  Norepinephrine drip.  2.  Heparin drip.  3.  Aspirin 81 mg daily.  4.  Plavix 75 mg daily.  5.  Diazepam 5 mg IV q. 12 hours.  6.  Fluoxetine 40 mg daily.  7.  Sliding scale insulin.  8.  Imdur 60 mg daily.  9.   Remeron 15 mg p.o. at bedtime.  10. Morphine 2 to 4 mg IV q.4 hours p.r.n.  11. Zofran 4 mg IV q. 4 hours p.r.n.  12. Zosyn 3.375 grams IV q. 8 hours.  13. Pravastatin 40 mg daily.  14. Senna 1 tablet b.i.d.  15. Tiotropium 1 capsule inhaled daily.   SOCIAL HISTORY: Unable to obtain directly from the patient. However, per dictated history and physical, it appears that the patient quit smoking about 4 years ago. He also quit drinking alcohol 8 years ago.   FAMILY HISTORY: Unable to obtain directly from the patient. However, it appears that the family history is positive for coronary artery disease.   REVIEW OF SYSTEMS: Unable to obtain from the patient as he was on BiPAP when he was seen.   PHYSICAL EXAMINATION:  VITAL SIGNS: Temperature 98.2, pulse 70, respirations 24, blood pressure 97/42.  GENERAL: Reveals a well-developed, well-nourished male who appears critically ill.  HEENT: Normocephalic, atraumatic. Extraocular movements were difficult to assess. Pupils were 4 mm and sluggish to react. BiPAP face mask was on. Oral mucosa was dry.  NECK: Supple without JVD or lymphadenopathy.  LUNGS: Demonstrated bilateral rhonchi with slightly increased work of breathing as the patient was on BiPAP.  CARDIOVASCULAR:  S1, S2, regular rate and rhythm. No murmurs or rubs appreciated.  ABDOMEN: Soft, nontender, nondistended. Bowel sounds positive. No rebound or guarding. No gross organomegaly appreciated.  EXTREMITIES: No clubbing, cyanosis or edema noted.  NEUROLOGIC: The patient was  slow to respond as he was noted to be on BiPAP.  SKIN: Warm and dry. No rashes noted.  MUSCULOSKELETAL: No joint redness, swelling or tenderness appreciated.  GENITOURINARY: No suprapubic tenderness is noted at this time.  PSYCHIATRIC: Unable to assess at this time.   LABORATORY DATA: Sodium 137, potassium 5.9, chloride 94, CO2 of 32, BUN 39, creatinine 3.3, glucose 144, total protein 6.9, albumin 3.3, total bilirubin  3.2, alkaline phosphatase 87, AST greater than 2000, ALT 3205. CK-MB 5.6. Troponin 1.1. CBC shows a WBC of 19.8, hemoglobin 11.9, hematocrit 38, platelets 166,000. INR 2.0. Blood cultures x2 sets are negative. Urinalysis shows urine protein 100 mg/dL, negative nitrites, negative leukocyte esterase.   IMPRESSION:  This is a 64 year old Caucasian male with a past medical history of coronary artery disease, status post coronary artery bypass graft; chronic systolic heart failure with an ejection fraction less than 20%; chronic obstructive pulmonary disease with chronic respiratory failure; diabetes mellitus; hypertension; hyperlipidemia; chronic lower back pain; gastroesophageal reflux disease;  anxiety and depression who presented to [facility] with altered mental status and found to have acute renal failure as well as acute respiratory failure.  1.  Acute renal failure, unspecified. The patient could potentially have a cardiorenal syndrome or could have acute tubular necrosis. He is noted as being hypotensive. We agree with pressor therapy to maintain a MAP of 65 or greater. No urgent indication for dialysis at present. However, this may be necessary over the course of this hospitalization. We will obtain a renal ultrasound, SPEP, UPEP and ANA for further evaluation.  2.  Hyperkalemia. Serum potassium noted to be high at 5.9. We will need to continue to monitor this. As he is on BiPAP, he may end up requiring Kayexalate rectally to help treat this.  3.  Acute respiratory failure. The patient is currently on BiPAP. He may end up requiring intubation if his pCO2 does not improve. Palliative care has also been consulted.  4.  Chronic systolic heart failure. The patient has a history of an ejection fraction of 20%. I would recommend obtaining a 2-D echocardiogram to evaluate his ejection fraction now. However, I will defer this to cardiology as well as hospitalist.   I would like to thank  Dr. Benjie Karvonen for this kind referral. The prognosis is guarded at present.    ____________________________ Tama High, MD mnl:JT D: 05/01/2014 06:05:29 ET T: 05/01/2014 07:55:47 ET JOB#: [id number]  cc: Tama High, MD, <Dictator> Mariah Milling Treasure Ochs MD ELECTRONICALLY SIGNED 06/03/2014 15:06

## 2014-09-22 NOTE — Op Note (Signed)
PATIENT NAME:  William Bray, William Bray MR#:  [id number] DATE OF BIRTH:  04-16-1951  DATE OF PROCEDURE:  05/26/2014   PROCEDURE: Removal of left IJ PermCath.  SURGEON:  Katha Cabal, MD  ANESTHESIA:  Local.  ESTIMATED BLOOD LOSS:  Minimal.  INDICATION FOR PROCEDURE:    Risks and benefits were discussed and informed consent was obtained.   DESCRIPTION OF PROCEDURE:  The patient's neck was sterilely prepped and draped.  The area around the catheter was anesthetized copiously with 1% Lidocaine. The catheter was dissected out with curved hemostats until the cuff was freed from the surrounding fibrous sheath.  The fibrous sheath was transected and the catheter was then removed in its entirety using gentle traction.  Pressure was held and sterile dressings placed.    The patient tolerated the procedure well and was taken to the recovery room in stable condition.   ____________________________ Katha Cabal, MD ggs:jp D: 05/26/2014 13:33:40 ET T: 05/26/2014 15:21:23 ET JOB#: [id number]  cc: Katha Cabal, MD, <Dictator> Katha Cabal MD ELECTRONICALLY SIGNED 06/08/2014 17:32

## 2014-09-22 NOTE — Consult Note (Signed)
PATIENT NAME:  William Bray, William Bray MR#:  [id number] DATE OF BIRTH:  1951/01/12  DATE OF CONSULTATION:  05/03/2014  REFERRING PHYSICIAN: Nicholes Mango, MD  REASON FOR CONSULTATION: Sepsis and hypothermia.   HISTORY OF PRESENT ILLNESS: This is a 64 year old gentleman, who has multiple medical problems including chronic obstructive pulmonary disease on home BiPAP at night, as well as p.r.n. oxygen, coronary artery disease, idiopathic cardiomyopathy, admitted 04/30/2014 with altered mental status. Per his wife, who the history is obtained from, he had been becoming increasingly drowsy for several days. He had been using more pain medicine for his back pain after he had overdone himself cleaning out the house and removing a lot of old carpet. When she went to awaken him, she was unable to, and he was unable to get out of bed, so EMS was called. Since admission, the patient has been intubated. He has been noted to have markedly elevated AST and ALT as well as acute renal failure and is on CRRT. He has also had hypotension and was on pressors.   PAST MEDICAL HISTORY:  1. Chronic obstructive pulmonary disease.  2. Congestive heart failure, ejection fraction 20%.  3. Coronary artery disease, status post CABG.  4. Diabetes.  5. Hypertension.  6. Hyperlipidemia.  7. GERD.  8. Anxiety.  9. Depression.   PAST SURGICAL HISTORY: Automatic implantable cardiac defibrillator placement and CABG.   SOCIAL HISTORY: The patient does not smoke or drink currently, but had a heavy tobacco use in the past. He lives with his wife.   FAMILY HISTORY: Noncontributory.   ALLERGIES: He has no known drug allergies.   REVIEW OF SYSTEMS: Unable to be obtained.   MEDICATION: Antibiotics since admission include azithromycin 04/30/2014 through 05/02/2014, Zosyn 04/29/2014 through 05/02/2014, levofloxacin.   PHYSICAL EXAMINATION:  VITAL SIGNS: Temperature 96.6, pulse 50, blood pressure 112/70, respirations 18, saturation 100% on  ventilator.  GENERAL: He is intubated, sedated. He has a right triple-lumen catheter and a left hemodialysis catheter. He has a Foley catheter in place.  HEENT: Pupils equal, round, and reactive to light and accommodation.   HEART: Loletha Grayer, but regular. Distant heart sounds.  LUNGS: Coarse breath sounds bilaterally.  ABDOMEN: Soft, mildly distended. Unable to assess tenderness.  EXTREMITIES: 1+ edema bilateral lower extremities. NEUROLOGICAL: Sedated.   LABORATORY DATA: White blood count on admission was 19.8; currently 9.3, hemoglobin 10.7, platelets 140,000. Differential shows initially 17,000 neutrophils from 05/01/2014, 0.2 lymphocytes. Urinalysis 04/30/2014 was negative.  No urine culture done. Blood cultures x2 04/30/2014 no growth to date. Renal function on admission showed a creatinine of 3.33. It peaked at 5.99 on 05/02/2014 and with CRRT it is now 3.83. Liver tests initially had a total bilirubin of 3.2, an AST greater than 2000 and ALT 3205. Currently, total bilirubin 1.1, alkaline phosphatase 102, AST greater than 2000, ALT greater than 4000.   IMAGING: Chest x-ray showed 04/30/2014 showed cardiomegaly with a pacemaker. Ultrasound of the abdomen showed gallbladder wall thickening and edema, suggestive of cholecystitis of potential acalculous cholecystitis. There was hepatic steatosis. Chest x-ray 05/01/2014 showed NG tube in place. Ultrasound followup 05/02/2014 showed a slightly decreased gallbladder wall thickening and pericholecystic fluid.   IMPRESSION: A 64 year old gentleman with multiple medical problems including chronic obstructive pulmonary disease with hypercapnia, coronary artery disease and congestive heart failure, admitted with altered mental status, as well as a leukocytosis and septic physiology. Chest x-ray did not show pneumonia. Ultrasound showed possible cholecystitis, and he has markedly elevated AST and ALT. He remains intubated  and having issues with hypothermia. He is  now on continuous renal replacement therapy for his renal failure.   RECOMMENDATIONS:  1. Continue Zosyn and levofloxacin. If he decompensates, I would add vancomycin given the lines in place.  2. Check leptospirosis serologies and doxycycline 100 mg q.12 IV for now.  3. Check hepatitis serologies.   Thank you for the consult. I would be glad to follow with you.   ____________________________ Cheral Marker. Ola Spurr, MD dpf:ap D: 05/03/2014 14:08:44 ET T: 05/03/2014 14:41:56 ET JOB#: [id number]  cc: Cheral Marker. Ola Spurr, MD, <Dictator> William Bray Ola Spurr MD ELECTRONICALLY SIGNED 05/30/2014 21:23

## 2014-09-23 DIAGNOSIS — D72829 Elevated white blood cell count, unspecified: Secondary | ICD-10-CM | POA: Insufficient documentation

## 2014-09-26 DIAGNOSIS — J439 Emphysema, unspecified: Secondary | ICD-10-CM | POA: Insufficient documentation

## 2014-09-26 NOTE — Consult Note (Signed)
PATIENT NAME:  William Bray, William Bray MR#:  [id number] DATE OF BIRTH:  1951-05-26  DATE OF CONSULTATION:  06/20/2014  REFERRING PHYSICIAN:  Theodoro Grist, MD  PRIMARY CARE PHYSICIAN:  Juluis Pitch, MD  CARDIOLOGIST:  Alen Blew, MD at [facility].  CONSULTING PHYSICIAN:  Isaias Cowman, MD  CHIEF COMPLAINT:  Shortness of breath.   REASON FOR CONSULTATION: Consultation requested for evaluation of wide complex tachycardia and borderline elevated troponin.   HISTORY OF PRESENT ILLNESS: The patient is a 64 year old gentleman with known CAD, status post CABG, with known ischemic cardiomyopathy, status post ICD who is admitted at this time with chief complaint of shortness of breath. The patient was apparently hospitalized in December 2015, for apparent viral syndrome, discharged to [facility] and was readmitted 05/29/2014, following aspiration pneumonia. The patient was discharged home only to return earlier today, when he woke up at 4:00 a.m. with acute shortness of breath. In the Emergency Room, the patient was noted to be hypoxic. Admission labs were notable for borderline elevated troponin of 0.24.  EKG revealed atrial fibrillation with a rapid ventricular rate. The patient denied chest pain. Other notable admission labs included an elevated white count of 18,200. Chest x-ray revealed evidence for right-sided pneumonia.   PAST MEDICAL HISTORY:  1.  Status post CABG in 1991 at [facility].  2.  Ischemic cardiomyopathy status post implantable ICD. 3.  Chronic systolic congestive heart failure.  4.  Recent aspiration pneumonia.  5.  COPD.   MEDICATIONS: Spironolactone 12.5 mg daily, isosorbide mononitrate 1 daily, furosemide 40 mg daily, Clopidogrel 75 mg daily, carvedilol 25 mg b.i.d., aspirin 81 mg daily.  ProAir 2 puffs q.4 hours, pantoprazole 40 mg daily, fluoxetine 60 mg daily, cefuroxime 500 mg b.i.d., alprazolam 0.5 mg q.6 p.r.n., allopurinol 100 mg daily, albuterol nebulizer, acetaminophen  325 mg q.4 hours p.r.n.   SOCIAL HISTORY: The patient is married, resides with his wife. He quit tobacco abuse.   FAMILY HISTORY: No immediate family history of coronary artery disease or myocardial infarction.   REVIEW OF SYSTEMS:     CONSTITUTIONAL: No fever or chills.  EYES: No blurry vision.  EARS: No hearing loss.  RESPIRATORY: Shortness of breath as described above.  CARDIOVASCULAR: No chest pain or peripheral edema.  GASTROINTESTINAL: No nausea, vomiting, or diarrhea.  GENITOURINARY: No dysuria or hematuria.  ENDOCRINE: No polyuria or polydipsia.  MUSCULOSKELETAL: No arthralgias or myalgias.  NEUROLOGICAL: No focal muscle weakness or numbness.  PSYCHOLOGICAL: No depression or anxiety.   PHYSICAL EXAMINATION:   VITAL SIGNS: Blood pressure 111/67, pulse was 120 and irregularly irregular, respirations 26, temperature 98.2, pulse oximetry 95%.  HEENT: Pupils equal, reactive to light and accommodation.  NECK: Supple without thyromegaly.  LUNGS: Revealed decreased breath sounds with scattered wheezes.  CARDIOVASCULAR: Normal JVP.  Normal PMI. Tachycardia, irregularly irregular rhythm. Normal S1, S2, no appreciable gallop, murmur, or rub.  ABDOMEN: Soft and nontender.  EXTREMITIES: There is no cyanosis, clubbing, or edema. Pulses were intact bilaterally.  MUSCULOSKELETAL: Normal muscle tone.  NEUROLOGIC: The patient is alert and oriented x3. Motor and sensory are both grossly intact.   IMPRESSION: A 64 year old gentleman with known coronary artery disease, ischemic cardiomyopathy, status post implantable cardiac defibrillator with history of atrial fibrillation who presents with signs and symptoms consistent with pneumonia. EKG reveals atrial fibrillation with rapid ventricular rate, currently appears asymptomatic. The patient denies chest pain. The patient has borderline elevated troponin in the absence of chest pain, likely due to demand supply ischemia, secondary to pneumonia.  RECOMMENDATIONS:  1.  Agree with overall current therapy.  2.  Would defer full dose anticoagulation.  3.  Continue diltiazem drip.  4.  Further recommendations pending the patient's initial clinical course.     ____________________________ Isaias Cowman, MD ap:at D: 06/20/2014 10:54:37 ET T: 06/20/2014 11:10:42 ET JOB#: [id number]  cc: Isaias Cowman, MD, <Dictator> Isaias Cowman MD ELECTRONICALLY SIGNED 07/20/2014 14:36

## 2014-09-26 NOTE — Discharge Summary (Signed)
PATIENT NAME:  William Bray, William Bray MR#:  [id number] DATE OF BIRTH:  17-Jun-1950  DATE OF ADMISSION:  05/29/2014 DATE OF DISCHARGE:  06/03/2014  PRIMARY CARE PROVIDER:  Youlanda Roys. Lovie Macadamia, MD    ADMITTING DIAGNOSES:  1.  Acute respiratory failure with hypoxia and hypercapnia.  2.  Acute encephalopathy.  3.  Sepsis.  4.  Elevated troponin in the setting of coronary artery disease.   DISCHARGE DIAGNOSES:  1.  Acute respiratory failure with hypoxia and hypercapnia.  2.  Sepsis.  3.  Hospital acquired pneumonia versus aspiration pneumonia.  4.  Chronic systolic heart failure with ejection fraction of 20%.  5.  Chronic obstructive pulmonary disease.   6.  Cirrhosis.  7.  Elevated troponin due to demand ischemia.  8.  Coronary artery disease.  9.  Chronic kidney disease stage IV.  10.  Hyperlipidemia.  11.  Depression.  12.  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  13.  Gout.  14.  Diabetes mellitus type 2.  15.  Severe deconditioning.   DISCHARGE MEDICATIONS:  1.  Pravastatin 40 mg 1 tablet daily.  2.  Carvedilol 25 mg 1 tablet twice a day.  3.  Pantoprazole 40 mg 1 tablet daily.  4.  Spironolactone 25 mg, 0.5 tablets once a day.  5.  Clopidogrel 75 mg 1 tablet once a day.  6.  Aspirin 81 mg 1 tablet daily.    7.  Furosemide 40 mg 1 tablet daily.  8.  ProAir HFA CFC free 90 mcg/inhalation, 2 puffs inhaled every 4 hours as needed for shortness of breath.  9.  Albuterol 2.5 mg/3 mL inhalation solution, 1 vial inhaled every 3 hours as needed for shortness of breath.  10.  Allopurinol 100 mg 1 tablet once a day.  11.  Isosorbide mononitrate 60 mg 1 tablet once a day.  12.  Fluoxetine 60 mg 1 tablet once a day.  13.  Glucose 4 grams oral tablet chewable 3 tabs every hour as needed for hypoglycemia or blood sugars less than 60.  14.  Alprazolam 0.5 mg 1 tablet once every 6 hours as needed for anxiety.  15.  NovoLog 100 units/mL per sliding scale as directed.  16.  Prednisone 20 mg 1  tablet daily.  17.  Acetaminophen 325 mg 2 tablets every 4 hours as needed for pain.  18.  Lactulose 10 grams/15 mL, 15 mL orally once a day.  19.  Levofloxacin 750 mg 1 tablet once a day.  11.  Front wheel walker for balance and mobility.   CONSULTATIONS:  1.  Manya Silvas, MD, gastroenterology  2.  Cheral Marker. Ola Spurr, MD, infectious disease  3.  Allyne Gee, MD, pulmonology   HISTORY OF PRESENT ILLNESS: This is a 64 year old man who recently was discharged from [facility] after admission from December 4-18th, which included ventilator -dependent respiratory failure, cardiomyopathy with ejection fraction of 20%, sepsis, cholangitis, acute renal failure, on dialysis, non-ST elevation MI, transaminitis presents from rehab facility.  He had been doing fairly well since discharge. He was planning to come home from rehabilitation soon. On the day prior to presentation he had some shortness of breath with exertion. He had decreased appetite and was not eating well. He had back pain and did take oxycodone on the day prior to admission, which helped with his pain. He had some swelling in both feet. On the morning of admission he was eating breakfast and then abruptly became unresponsive. EMS was called and he  was found to be hypoxic and was intubated in the field. Hospitalist service was contacted for further evaluation and treatment. The patient was admitted to the CCU.   1.  Acute respiratory failure with hypoxia and hypercapnia. The patient intubated in the field and admitted to the critical care unit. Pulmonology was consulted. Oxygen and PEEP were titrated as needed. He was on full ventilator support until extubation on January 3. Respiratory failure likely due to aspiration versus hospital acquired pneumonia. After extubation the patient did well and is being discharged without supplemental oxygen.  2.  Sepsis: Likely due to hospital-acquired pneumonia. He was hypotensive on presentation and did have  to be on Levophed initially. Infectious disease was consulted. He was afebrile. Did have gram negatives rods in his sputum, which grew pseudomonas and H. influenza. He will be treated with a 14 day total course of levofloxacin. He was able to wean quickly off of pressors and was able to leave the intensive care unit on January 3. Sepsis resolved.  3.  Hospital acquired pneumonia: As noted above, sputum grew Haemophilus influenza and pseudomonas. He will be treated with levofloxacin. Also, given the abrupt nature of his respiratory failure while eating, there is some concern for aspiration pneumonia. He did not have any further trouble with  dysphagia or swallowing. He may have been slightly sedated due to the oxycodone he took, which caused him to have difficulty swallowing on the day of admission. He is advised to be vigilant about sitting up and paying attention during meals. He is to avoid narcotic pain medications if at all possible.  4.  Chronic systolic heart failure with an ejection fraction of 20%. He  did not have any signs of overt congestive heart failure exacerbation during the hospitalization. Continue with Lasix. He will need to continue to follow up with cardiology in the outpatient setting.  5.  COPD:  He was treated with IV steroids and nebulizer treatments due to respiratory failure in the setting of COPD. He was transitioned to oral prednisone. He continues on his home regimen of inhalers and nebulizers.  6.  Fatty liver disease: Gastroenterology was consulted during the hospitalization. During his prior hospitalization in December transaminases were greater than 2000, they did trend downward. Ultrasound of the liver showed fatty liver disease. He had mildly elevated ammonia prior to this admission and again during this admission. Gastroenterology feels that he has recovered well from shock liver. Ultrasound does not show nodularity suggestive of cirrhosis. He  may have early cirrhosis but  evaluation mostly consistent with fatty liver disease. Recommendation for continuing multivitamin. Follow up as an outpatient.  7.  Elevated troponins: Likely due to ischemic strain. Not an ACS event during this hospitalization.  8.  Coronary artery disease: No ACS during this admission. Continue on Plavix, aspirin, statin, beta blocker.  9.  Stage III chronic kidney disease: Renal function actually improved during this hospitalization. At the time of discharge, he is more in line with a stage II CKD picture. He did have some hypokalemia, which was replaced during hospitalization.  10.  Hyperlipidemia: Continue with a statin.  11.  Depression: Stable at the time of discharge.  12.  Anemia and thrombocytopenia: Likely due to chronic disease and bone marrow suppression.  13.  Gout: No gout flares during the admission.  14.  Diabetes mellitus type 2: Continue on sliding scale.  15.  Deconditioning: The patient will need home health nursing and physical therapy after admission.   DISCHARGE EXAMINATION:  VITAL SIGNS: Temperature 98.4, pulse 72, respirations 18, blood pressure 130/76, oxygenation 99% on 2 liters nasal cannula.  GENERAL: No acute distress.  CARDIOVASCULAR: Regular rate and rhythm. No murmurs, rubs, or gallops. No peripheral edema. Peripheral pulses 2+.  RESPIRATORY: Lungs clear to auscultation bilaterally with good air movement.  PSYCHIATRIC: Alert and oriented, good insight into his clinical condition. Anxious due to recent acute severe medical issues.   LABORATORY DATA: Sodium 143, potassium 4.0, chloride 99, bicarbonate 38, BUN 32, creatinine 1.32. GFR is 58. Magnesium 1.9, total protein 6.1, albumin 2.6, bilirubin 1.1, AST 67, ALT 73, troponins 0.08 and 0.09, TSH 0.45. UDS positive for benzos and opiates, both prescribed. White blood cells 9.5, hemoglobin 8.7, platelets 94, MCV 86, HIV negative/nonreactive.   CONDITION: Fair, this patient has multiple fairly severe chronic medical  conditions.   DISPOSITION: The patient is being discharged home, he declines SNF placement. He will have home health nursing and physical therapy followup.   DISCHARGE INSTRUCTIONS:  DIET: Heart-healthy, low-sodium carbohydrate-modified diet.  ACTIVITY: As tolerated per physical therapy recommendations.   TIMEFRAME FOR FOLLOWUP: He will need to follow up with his primary care provider within the next 2 weeks.   TIME SPENT ON DISCHARGE: 45 minutes.     ____________________________ Earleen Newport. Volanda Napoleon, MD cpw:AT D: 06/10/2014 21:28:36 ET T: 06/10/2014 23:40:04 ET JOB#: [id number]  cc: Earleen Newport. Volanda Napoleon, MD, <Dictator> Youlanda Roys. Lovie Macadamia, MD Aldean Jewett MD ELECTRONICALLY SIGNED 06/17/2014 18:43

## 2014-09-26 NOTE — Discharge Summary (Signed)
PATIENT NAME:  William Bray, William Bray MR#:  [id number] DATE OF BIRTH:  [birth year]  DATE OF ADMISSION:  06/20/2014 DATE OF DISCHARGE:  06/23/2014  ADMITTING DIAGNOSIS: Acute on chronic respiratory failure with hypoxia as well as hypercapnia.  DISCHARGE DIAGNOSES: 1.  Acute on chronic respiratory failure with hypoxia and hypercapnia.  2.  Pseudomonas aeruginosa bilateral pneumonia, more on the right side.  3.  Atrial fibrillation, rapid ventricular response due to respiratory failure and pneumonia, also COPD exacerbation, now on Eliquis.  4.  Chronic obstructive pulmonary disease exacerbation due to pneumonia.  5.  Leukocytosis.  6.  Non-Q-wave myocardial infarction due to demand ischemia, per cardiology. No acute coronary syndrome.  7.  Renal insufficiency, resolved.  8.  Elevated transaminases, resolved.  9.  Generalized weakness.  10.  History of chronic systolic congestive heart failure with ejection fraction of 20%; cardiomyopathy, ejection fraction of 20%; chronic obstructive pulmonary disease; liver cirrhosis; chronic kidney disease, stage IV; hyperlipidemia; depression; anemia; thrombocytopenia; gastroesophageal reflux disease; diabetes mellitus; deconditioning; history of atrial flutter with ablation; depression; history of ventricular fibrillation in the past; history of fatty liver disease as well as coronary artery disease.   DISCHARGE CONDITION: Stable.   DISCHARGE MEDICATIONS:  1.  The patient is to continue Coreg 25 mg p.o. twice daily.  2.  Protonix 40 mg p.o. daily.  3.  Spironolactone 25 mg p.o. half tablet once daily.  4.  Plavix 75 mg p.o. daily.  5.  Aspirin 81 mg p.o. daily.  6.  Furosemide 40 mg p.o. daily.  7.  ProAir HFA 2 puffs every 4 hours as needed.  8.  Albuterol 2.5 mg in 3 mL inhalation solution every 3 hours as needed.  9.  Allopurinol 100 mg p.o. daily.  10.  Isosorbide mononitrate 60 mg p.o. daily.  11.  Doxepin 50 mg p.o. daily.  12.  Glucose 4 grams chewable  tablets 3 tablets every 1 hour as needed for hypoglycemia.  13.  Alprazolam 0.5 mg every 6 hours as needed.  14.  Glucose blood strips 2 times daily as needed.  15.  Tylenol 325 mg 2 tablets every 4 hours as needed.  16.  Prednisone 50 mg p.o. once on 06/24/2014, then taper x 10 mg every 2 days until stopped.  17.  Pravachol 40 mg p.o. at bedtime.  18.  Budesonide/formoterol 160/4.5 two puffs twice daily.  19.  Tiotropium 18 mcg 1 inhalation once daily.  20.  Lactulose 10 grams in 15 mL oral syrup 15 mL 1 daily as needed.  21.  Ciprofloxacin 500 mg p.o. twice daily for 10 days.  22.  Lisinopril 2.5 mg p.o. once daily; this is a new medication.  23.  Eliquis 5 mg p.o. twice daily; this is a new medication. It is recommended to follow the patient's kidney function very closely, in fact in the next 1-2 days after discharge to ensure its stability since the patient recently received IV dye for CTA of his chest and he has recently started on lisinopril plus also being continued on Lasix. The patient is not to take cefuroxime.  SERVICES:  Home health, physical therapy as well as nurse.   HOME OXYGEN: With portable tank at 2 L of oxygen through nasal cannula.   DIET: 2 grams salt, low fat, low cholesterol, regular consistency.   ACTIVITY LIMITATIONS: As tolerated.   FOLLOWUP: Appointment with Dr. Lovie Macadamia in 2 days after discharge, Dr. Saralyn Pilar in 1 week after discharge, and Dr. Raul Del in 1  week after discharge. The patient was advised to have his kidney function as well as potassium levels checked in the next 1-2 days after discharge.   CONSULTANTS: Dr. Raul Del, care management, social work, Dr. Saralyn Pilar.   RADIOLOGIC STUDIES: Chest x-ray, portable single view, 06/20/2014, showed increasing bibasilar infiltrates, worse on the right than on the left. Chest, portable single view, 06/21/2014, showed decreased opacities consistent with improving pneumonia or atelectasis, continued radiographic  follow-up was recommended until resolution. VQ scan 06/21/2014 showed multiple fairly large segmental matching ventilation and perfusion defects. No significant segmental ventilation perfusion mismatch. The pattern of uptake is consistent with intermediate category probability of pulmonary embolism based on PIOPED II guidelines.  CTA of the chest to rule out pulmonary embolism the 06/23/2014, revealing no evidence of acute pulmonary embolism. There are heterogeneous pulmonary opacities. Mild mediastinal adenopathy. Small pleural effusion and a small free fluid in the abdomen. Findings most likely represents volume overload, an inflammatory process was not excluded. Some of the lung parenchyma opacities had a masslike appearance; these opacities have a peripheral distribution. Malignancy or pulmonary infarct are also a differential consideration; a 19-monthfollowup CT scan was recommended to ensure improvement of these findings. Doppler ultrasound of bilateral lower extremities, 06/20/2014, showed no evidence of DVT.   HOSPITAL COURSE: The patient is a 64year old Caucasian male with history of COPD, history of cardiomyopathy with ejection fraction of 20% who presented to the hospital with complaints of shortness of breath. Please refer to Dr. VKeenan Bacheloradmission note on the 06/20/2014. Apparently, he was continued on Levaquin after discharge from the hospital on January 7, and he had improved. Initially, he had a yellowish phlegm; however, it improved whitish phlegm but, on the day of admission, he started noticing blood in his expectorations and he became very short of breath. He was brought to Emergency Room for further evaluation. In the Emergency Room, his temperature was 98.7, pulse 175, respiration rate was 30, blood pressure 156/91, saturation was 88% on oxygen therapy. He was noted to be in atrial fibrillation, RVR. Physical exam revealed diminished breath sounds at the bases bilaterally, and the patient  was in mild to moderate respiratory distress on BiPAP mask. His chest x-ray was concerning for worsening bilateral pneumonia, especially on the right side. The patient was admitted to the hospital for further evaluation. He was started on broad-spectrum antibiotic therapy and consultation with cardiologist as well as pulmonologist were obtained. He was initiated on Cardizem IV drip for his atrial fibrillation, RVR. He was seen by cardiologist and recommended to continue conservative therapy with no anticoagulation due to hemoptysis. Since it was felt that the patient's atrial fibrillation, RVR, as well as subsequent elevation of troponins to the level of 15.0 on 06/20/2014, was very likely non-Q-wave MI due to atrial fibrillation, RVR. The patient's sputum cultures were obtained, and they revealed Pseudomonas aeruginosa, sensitive to all antibiotics including ciprofloxacin, gentamicin, started on imipenem as well as levofloxacin. The patient's antibiotics were changed to ciprofloxacin orally. With conservative therapy, his condition improved.   In regard to his acute on chronic respiratory failure with hypoxia and hypercarbia, it was felt to be due to pneumonia. The patient was advised to continue antibiotic therapy for 10 days and follow up with Dr. FRaul Delin the next few days after discharge. It is recommended to follow up his CT scan as outpatient to ensure its stability and improvement. No PE was noted on CTA of chest; however, since the patient was having continuous atrial fibrillation, he  was advised by cardiologist, Dr. Saralyn Pilar, to start on Eliquis, watching his clinical condition closely. As mentioned above, the patient did have a Doppler ultrasound of his lower extremities, which was negative for deep vein thrombosis.   In regard to pneumonia, as mentioned above, the patient is to continue antibiotic therapy for 10 days to complete course.   For atrial fibrillation, RVR, the patient was weaned off  Cardizem drip and is to continue Coreg for rate control. He was advised by the cardiologist, according to phone conversation with him today, 06/23/2014, to be started on Eliquis. Dr. Saralyn Pilar is going to reassess patient's need for Eliquis as time progresses since it is expected that the patient may return back to sinus rhythm when his infection subsides.  In regard to non-Q-wave MI, no further recommendations in regards to therapies except as mentioned above conservative therapy was made by cardiologist. Echocardiogram was not repeated during this admission. The patient is to follow up with Dr. Saralyn Pilar and make decisions about further evaluation if needed.   In regard to renal insufficiency, the patient was noted to have mild renal insufficiency while he was in the hospital. On arrival to the hospital, the patient's kidney function was relatively normal with estimated GFR of more than 60 and creatinine level of 1.21. His kidney function worsened to creatinine 1.66 on 06/21/2014, estimated GFR at that point dropped down to 45. The patient was given some IV fluids and his creatinine improved to 1.11. by 06/23/2014. His GFR also improved to more than 60. Now, since the patient is being started on ACE inhibitor, he was started on lisinopril and being continued on Lasix as well as spironolactone. It is recommended to follow the patient's kidney function very closely, especially since he received IV dye, not so long ago to rule out pulmonary embolus. The patient may benefit from evaluation by nephrologist in the nearest future.   The patient was noted to have elevated transaminases while in the hospital of unclear etiology. Elevated transaminases were followed while he was in the hospital and liver function tests normalized by the 06/23/2014. It was unclear why the patient had this episode of elevation elevated transaminases; however, passive liver congestion was of concern during the episode of his atrial  fibrillation, RVR.   For generalized weakness, the patient was evaluated by physical therapist and home health physical therapy was recommended for him upon discharge, which is going to be provided for him on the day of discharge.   TIME SPENT: 40 minutes.    ____________________________ Theodoro Grist, MD rv:bm D: 06/23/2014 19:43:35 ET T: 06/24/2014 00:02:31 ET JOB#: [id number]  cc: Theodoro Grist, MD, <Dictator> Youlanda Roys. Lovie Macadamia, MD Isaias Cowman, MD Herbon E. Raul Del, MD  Theodoro Grist MD ELECTRONICALLY SIGNED 07/04/2014 16:09

## 2014-09-26 NOTE — Consult Note (Signed)
PATIENT NAME:  William Bray, William Bray MR#:  [id number] DATE OF BIRTH:  [birth year]  DATE OF CONSULTATION:  05/30/2014  REFERRING PHYSICIAN:   CONSULTING PHYSICIAN:  Cheral Marker. Ola Spurr, MD  REQUESTING PHYSICIAN:  Dr. Margaretmary Eddy.    REASON FOR CONSULTATION:  Sepsis and pneumonia.   HISTORY OF PRESENT ILLNESS: This is a pleasant 64 year old gentleman with multiple medical problems, who was recently admitted to the hospital with respiratory failure, renal failure, and markedly elevated LFTs as well as non-ST elevation MI. He was discharged December 18. He had his dialysis catheter removed December 30 as his renal function had improved. However he was readmitted January 2 with unresponsiveness. Apparently he had been improving clinically, but did have some shortness of breath with exertion. He was also having some increasing fatigue. He was given some oxycodone for back pain. He then however after eating breakfast became unresponsive. He was brought to the ER where he was hypoxic and intubated. He has clinically remained relatively stable. He has been treated with broad-spectrum antibiotics. We are consulted for further evaluation.   PAST MEDICAL HISTORY:  1. Recent admission as above for renal failure, liver failure, and respiratory failure.  2. Coronary artery disease.  3. CHF.   4. Diabetes.  5. Chronic kidney disease recently on temporary hemodialysis.  6. Hypertension.  7. Hyperlipidemia.  8. Atrial flutter with ablation.  9. Depression.  10. Fatty liver.  11. Questionable history of cirrhosis.  12. Anemia.  13. Thrombocytopenia.  14. Gout.   PAST SURGICAL HISTORY: Pacemaker and defibrillator, CABG, hernia.   SOCIAL HISTORY: He has been residing at [facility]. He quit smoking 5 years ago. He used to work as a Administrator.   FAMILY HISTORY: Noncontributory.   REVIEW OF SYSTEMS: Unable to be obtained.   ALLERGIES: No known drug allergies.   ANTIBIOTICS SINCE ADMISSION: Include  levofloxacin, linezolid, and Zosyn. He is also on methylprednisolone 60 q. 6. He has received vancomycin.   PHYSICAL EXAMINATION:  VITAL SIGNS: Temperature 98.6, blood pressures 146/119, heart rate 78, respirations 19 on ventilator, saturation 100%.  GENERAL: He is chronically ill-appearing. He is intubated.  HEENT: Pupils equal, round, and reactive to light and accommodation. Extraocular movements are intact. Sclerae anicteric. Oropharynx clear.  NECK: Supple.  HEART: Regular.  LUNGS: Coarse breath sounds bilateral bases.  ABDOMEN: Soft, nontender.  EXTREMITIES: 1 + edema bilateral lower extremity.  NEUROLOGIC: He is sedated.   LABORATORY DATA: White blood count on admission was 12.3, had come down to 3.3, hemoglobin 7.5, platelets 92,000. Blood cultures x 2 are negative from January 2. Urine culture January 3 no growth to date. Sputum culture with gram-negative rods yet to be identified. Urinalysis had 25 white cells. Renal function shows a creatinine 2.23. LFTs are relatively normal except albumin low at 2.8.   IMAGING: Chest x-ray January 2 showed ET tube in place, there is some pulmonary interstitial edema. CT of the head, no acute findings. Chest x-ray January 2, patchy consolidation in the right lung suspicious for developing pneumonia.   IMPRESSION: A 64 year old gentleman with recent prolonged hospitalization for acute respiratory failure, congestive heart failure, acute renal failure, and markedly elevated. LFTs, who had an extensive workup at that time. He was discharged and was improving at [facility] after about 2 weeks. However he became unresponsive and had respiratory failure. He potentially has what appears to be a right lower lobe pneumonia. Cultures are growing gram-negative rods yet to be identified.   RECOMMENDATIONS:   1.  Continue current antibiotics. I suspect he had an aspiration event and now has a pneumonia from that. Antibiotics can be tailored based on the  results of his gram-negative rods that grow. Blood cultures are negative.  2.  As he continues to improve he likely would need a 10 day course of treatment.  3.  Thank you for the consult. I will be glad to follow with you    ____________________________ Cheral Marker. Ola Spurr, MD dpf:bu D: 05/31/2014 16:04:23 ET T: 05/31/2014 16:43:07 ET JOB#: [id number]  cc: Cheral Marker. Ola Spurr, MD, <Dictator> [name] Ola Spurr MD ELECTRONICALLY SIGNED 06/09/2014 15:40

## 2014-09-26 NOTE — Consult Note (Signed)
See dictated note. Consult limited to liver only.  I doubt serious cirrhosis, may have fatty liver or early cirrhosis, hard to tell difference on [place].  His PT and low NH3 level on readmission show reasonable functionality of his liver.  I will follow with you but no further testing needs to be done on the liver.  Would follow NH3 level and it would be reasonable to give 30cc lactulose a day for a while.  He certainly did have a serious shock liver on previous admission.   Electronic Signatures: Manya Silvas (MD)  (Signed on 06-Jan-16 17:55)  Authored  Last Updated: 06-Jan-16 17:55 by Manya Silvas (MD)

## 2014-09-26 NOTE — H&P (Signed)
PATIENT NAME:  William Bray, William Bray MR#:  [id number] DATE OF BIRTH:  Dec 06, 1950  DATE OF ADMISSION:  06/20/2014  PRIMARY CARE PHYSICIAN: Youlanda Roys. Lovie Macadamia, M.D.   HISTORY OF PRESENT ILLNESS: The patient is a 64 year old, Caucasian male, with past medical history significant for history of admission in the beginning of January 2016 with pneumonia, acute on chronic respiratory failure, and being discharged from the hospital on 06/03/2014, who comes back to the hospital with significant shortness of breath. Apparently, the patient was doing well up until today in the morning when he woke up early in the morning with significant shortness of breath. According to the patient's family, the patient's wife, he did not feel well yesterday; however, as time progressed, he was a little bit better. Apparently when he was discharged on 06/03/2014, he was continued on Levaquin and he has improved. Initially, his sputum was yellow, but then improved to whitish phlegm. Today in the morning, however, he was noted to have bloody phlegm and significant shortness of breath. There was no significant wheezing or swelling in his lower or upper extremities, or fevers; however, the patient's family admits of him gaining some weight over the past 1 week, approximately 6 pounds since discharge from the hospital. However, the patient's family admits that the patient has been eating much better now. The patient was hypoxic, according to the patient's family, requiring advancement of his usual oxygen doses at home. He was brought to Emergency Room. He was noted to be with saturations of 88% on oxygen therapy. His chest x-ray revealed worsening pneumonia, and the hospitalist services were contacted for admission.   PAST MEDICAL HISTORY: Significant for history of admission at the beginning of January 2016 for acute respiratory failure with hypoxia as well as hypercapnia, acute encephalopathy due to the same, sepsis due to pneumonia which was  felt to be hospital-acquired pneumonia versus aspiration pneumonitis, elevated troponin in the setting of coronary artery disease, history of chronic systolic CHF with ejection fraction of 20%, chronic obstructive pulmonary disease, liver cirrhosis, history of CKD stage IV, hyperlipidemia, depression, anemia, as well as thrombocytopenia due to cirrhosis, GERD, diabetes mellitus type 2, severe deconditioning, history of hyperlipidemia, atrial flutter with ablation, history of depression, ventricular fibrillation arrest in the past, history of fatty liver, coronary artery disease.   PAST SURGICAL HISTORY: Pacemaker as well as defibrillator placement, coronary artery bypass grafting as well as hernia repair.   ALLERGIES: No known drug allergies.   MEDICATIONS: According to medical records, the patient was on Tylenol 325 mg 2 tablets every 4 hours as needed, albuterol 2.5 mg in 3 mL inhalation solution every 3 hours as needed, allopurinol 100 mg p.o. daily, alprazolam 0.5 mg every 6 hours as needed, aspirin 81 mg p.o. daily, Coreg 25 mg p.o. twice daily, Plavix 75 mg p.o. once daily, fluoxetine 60 mg p.o. once daily, furosemide 40 mg p.o. once daily, glucose 4-gram oral tablet 3 tablets every 1 hour as needed for hypoglycemia with blood glucose levels less than 60, isosorbide mononitrate 60 mg p.o. once daily, lactulose 10 grams in 15 mL oral syrup 15 mL once daily. Levofloxacin 750 mg p.o. once daily, the patient finished this therapy on 06/09/2014. NovoLog sliding scale, pantoprazole 40 mg p.o. daily, Pravachol 40 mg p.o. daily, prednisone unknown dose mg p.o. daily, ProAir HFA 2 puffs every 4 hours as needed, spironolactone 25 mg half tablet once daily. It is snot clear if he is still taking those medications since medication reconciliation is not  yet done.   SOCIAL HISTORY: The patient lives at home. In the past, was at [employer]. Quit smoking, according to the medical record,  approximately 5 years ago. Alcohol stopped approximately 9 years ago. No drug abuse. 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 Not employed at this time.   REVIEW OF SYSTEMS: Not available.   FAMILY HISTORY: The patient's father died in an accident. The patient's mother died of COPD.   PHYSICAL EXAMINATION: VITAL SIGNS: On arrival to the hospital, the patient's vital signs, temperature was 98.7, pulse was 155, respirations 30, blood pressure 156/91, saturation was 88% on oxygen therapy. With treatment, the patient's respiratory rate was decreasing to 23; however, the patient's heart rate remains high at 150. Blood pressure also decreased to 100/70. He still remains tachycardic. On arrival to the hospital, the patient's vital signs as mentioned above.  GENERAL APPEARANCE:  The patient is alert, oriented x 3. He is a well-developed, well-nourished, moderately obese, Caucasian male, in moderate distress. He is using accessory muscles to breathe intermittently. Has difficulty speaking due to shortness of breath. HEENT: Pupils are equal and reactive to light. Extraocular movements intact. No icterus or conjunctivitis. Has normal hearing. No pharyngeal erythema.  Mucosa is dry.  NECK: No masses. Supple, nontender. Thyroid is not enlarged. No adenopathy. No JVD or carotid bruits bilaterally. Full range of motion.    LUNGS: Diminished breath sounds in the bases, especially in the basal areas of lungs. A few rales were heard at the bases and no significant rhonchi or wheezing. Somewhat limited effort to breathe, tachypneic, in mild to moderate respiratory distress BiPAP mask is on the mouth and face.  CARDIOVASCULAR: S1, S2 appreciated. Rythm was regular. PMI not lateralized. Chest is nontender to palpation.   1+ to 2+ lower extremity edema, bilaterally, more in left lower extremity. No calf tenderness or cyanosis was noted.  ABDOMEN: Soft, nontender, bowel sounds are present. No  hepatosplenomegaly or masses were noted.  RECTAL: Deferred.  MUSCLE STRENGTH: Able to move all extremities. No cyanosis, degenerative joint disease or kyphosis. Gait was not tested.  SKIN: Did not reveal any rashes, lesions, erythema, nodularity, or induration. It was warm and dry to palpation.  LYMPHATIC: No adenopathy in the cervical region.  NEUROLOGICAL: Cranial nerves grossly intact. Sensory is intact. No dysarthria or aphasia. The patient is alert, oriented to person, place, cooperative; however, difficult to discuss with him since he is on BiPAP mask and having some shortness of breath, so difficulty with the review of systems.  LABORATORY DATA: EKG showed undetermined rhythm, likely sinus tachycardia, at the rate of 155 beats per minute, nonspecific interventricular conduction delay, QRS duration 142 ms. T wave abnormality, consider inferolateral ischemia according to EKG criteria.   The patient's laboratories,  BNP 10,778, glucose was 183, BUN of 21, creatinine 1.21, otherwise BMP was unremarkable.   The patient's liver enzymes: Albumin level of 3.2, alkaline phosphatase 144, AST as well as ALT 55 as well as 78 respectively.   Troponin is elevated at 0.24.  Hemoglobin level was 10.6, white blood cell count 18.2, and platelet count is 206.   A pH of venous blood was 7.31, pCO2 was 53.   RADIOLOGIC STUDIES: Chest x-ray, single view, portable 06/20/2014 showed increasing bibasilar infiltrates, worse on the right side than on the left side.   ASSESSMENT AND PLAN: 1. Acute on chronic respiratory failure with hypoxia, as well as hypercarbia. Admit the patient  to the medical floor. Continue BiPAP, continue therapy for questionable chronic obstructive pulmonary disease exacerbation, as well as bacterial pneumonia. Intubate as needed. Get a V/Q scan. 2. Wide complex tachycardia, likely sinus tachycardia with intraventricular conduction delay. The patient will be continued on Coreg, at this  time, but likely related to acidosis. We will get cardiology involved for recommendations.  3. Bacterial pneumonia. We will get sputum cultures and we will continue the patient on vancomycin, as well as Zosyn for now until cultures are known.  4. Leukocytosis, will follow with therapy.  5. Elevated troponin. We will continue the patient on aspirin and Coreg, as well as Plavix. We will check cardiac enzymes x 3. Very likely it is demand ischemia, but not acute coronary syndrome. We will get cardiology consultation as mentioned above.   TIME SPENT: 1 hour and 50 minutes.    ____________________________ Theodoro Grist, MD rv:JT D: 06/20/2014 09:53:51 ET T: 06/20/2014 11:37:54 ET JOB#: [id number]  cc: Theodoro Grist, MD, <Dictator> Youlanda Roys. Lovie Macadamia, MD Theodoro Grist MD ELECTRONICALLY SIGNED 07/04/2014 16:04

## 2014-09-26 NOTE — Consult Note (Signed)
Brief Consult Note: Diagnosis: HCAP with GNR on sputum.   Patient was seen by consultant.   Consult note dictated.   Recommend further assessment or treatment.   Orders entered.   Comments: Recent complicated admit with CHF, resp failure, renal failure and hepatitis now with recurrent respiratory failure.  GNR on sputum and CXR suggests RLL infilatrate  Rec cont treatment of HCAP Can tailor abx based on cx result.  Electronic Signatures: Angelena Form (MD)  (Signed 03-Jan-16 20:55)  Authored: Brief Consult Note   Last Updated: 03-Jan-16 20:55 by Angelena Form (MD)

## 2014-09-26 NOTE — Consult Note (Signed)
PATIENT NAME:  William Bray, William Bray MR#:  [id number] DATE OF BIRTH:  1950-08-10  DATE OF CONSULTATION:  06/02/2014  REFERRING PHYSICIAN:   CONSULTING PHYSICIAN:  Manya Silvas, MD  HISTORY OF PRESENT ILLNESS:  Patient is a 64 year old white male with multiple medical problems was admitted because of hypoxia and respiratory failure on 01/02, and he has since come off the ventilator.   The patient was in the hospital for 2 weeks in December, with respiratory failure, kidney failure and he also had a shock liver. His transaminases were over 2000; they eventually came down over 10 or 11 days. He had an ultrasound of his liver recently that showed markings consistent with a fatty liver. He has had mildly elevated ammonia level in the past but it did not get to levels normally seen with significant cirrhosis.   This consultation will be restricted to question about liver and cirrhosis.   PHYSICAL EXAMINATION: GENERAL: White male in no acute distress.  EYES:  Show no scleral icterus.  HEAD: Atraumatic.  HEART: Shows no murmurs or gallops I could hear.  CHEST: Anterior chest shows fairly good air flow little decreased on the right.  ABDOMEN: Shows minimal diffuse tenderness. No change with flexing his abdominal muscles. No palpable hepatosplenomegaly   LABORATORY DATA: Shows, January 2, he had a pro time of 15.4, hemoglobin of 8.9, platelet count 114,000, white count was 12.3. A CT of the head showed no acute findings. The liver tests on January 4, showed a total bilirubin 1.3, alkaline phosphatase 61, SGPT 25, SGOT 27, and an albumin of 2.2, total serum protein of 5.9, creatinine 2.04, BUN 37, white count 4.0, hemoglobin 7.1, hematocrit 23.6.   ASSESSMENT: The patient does not show any signs of cirrhosis at this time. He actually recovered reasonably well from the shock liver. His ultrasound did not show any nodularity to suggest cirrhosis. There were echo images consistent with a fatty liver; sometimes  this can also be due to very early cirrhosis, early on fat cannot be distinguished well from scar tissue. The low albumin is probably acute malnutrition secondary to being sick for most of the last month; the fact that his pro time is only 15, would indicate that much of the functionality of the liver has been maintained, as well as the fact that his bilirubin is only 1.3.   RECOMMENDATIONS: 1.  No further tests need to be done on the liver at this time.  2.  I would give 1 multivitamin a day, if he is not already getting it.   3.  When he improves, I would suggest vaccination with hepatitis A, B vaccine to prevent any possibility of acute hepatitis of either 1 of these.   I will follow with you while in the hospital.    ____________________________ Manya Silvas, MD rte:nt D: 06/02/2014 17:44:53 ET T: 06/02/2014 18:35:50 ET JOB#: [id number]  cc: Manya Silvas, MD, <Dictator> Cheral Marker. Ola Spurr, MD Nicholes Mango, MD Manya Silvas MD ELECTRONICALLY SIGNED 07/08/2014 14:06

## 2014-10-26 DIAGNOSIS — Z7901 Long term (current) use of anticoagulants: Secondary | ICD-10-CM | POA: Insufficient documentation

## 2015-01-29 ENCOUNTER — Encounter: Payer: Self-pay | Admitting: Emergency Medicine

## 2015-01-29 ENCOUNTER — Emergency Department
Admission: EM | Admit: 2015-01-29 | Discharge: 2015-01-29 | Disposition: A | Discharge status: Home / Self Care | Payer: Medicare Other | Attending: Emergency Medicine | Admitting: Emergency Medicine

## 2015-01-29 DIAGNOSIS — R350 Frequency of micturition: Secondary | ICD-10-CM | POA: Diagnosis present

## 2015-01-29 DIAGNOSIS — E119 Type 2 diabetes mellitus without complications: Secondary | ICD-10-CM | POA: Insufficient documentation

## 2015-01-29 DIAGNOSIS — R300 Dysuria: Secondary | ICD-10-CM | POA: Diagnosis not present

## 2015-01-29 DIAGNOSIS — I10 Essential (primary) hypertension: Secondary | ICD-10-CM | POA: Insufficient documentation

## 2015-01-29 HISTORY — DX: Dorsalgia, unspecified: M54.9

## 2015-01-29 HISTORY — DX: Other chronic pain: G89.29

## 2015-01-29 HISTORY — DX: Essential (primary) hypertension: I10

## 2015-01-29 HISTORY — DX: Type 2 diabetes mellitus without complications: E11.9

## 2015-01-29 HISTORY — DX: Unspecified osteoarthritis, unspecified site: M19.90

## 2015-01-29 HISTORY — DX: Polyneuropathy, unspecified: G62.9

## 2015-01-29 LAB — COMPREHENSIVE METABOLIC PANEL
ALT: 25 U/L (ref 17–63)
AST: 37 U/L (ref 15–41)
Albumin: 3.8 g/dL (ref 3.5–5.0)
Alkaline Phosphatase: 94 U/L (ref 38–126)
Anion gap: 6 (ref 5–15)
BILIRUBIN TOTAL: 0.6 mg/dL (ref 0.3–1.2)
BUN: 23 mg/dL — AB (ref 6–20)
CALCIUM: 9.3 mg/dL (ref 8.9–10.3)
CHLORIDE: 103 mmol/L (ref 101–111)
CO2: 27 mmol/L (ref 22–32)
CREATININE: 1.37 mg/dL — AB (ref 0.61–1.24)
GFR, EST NON AFRICAN AMERICAN: 53 mL/min — AB (ref >60)
Glucose, Bld: 121 mg/dL — ABNORMAL HIGH (ref 65–99)
Potassium: 4.7 mmol/L (ref 3.5–5.1)
Sodium: 136 mmol/L (ref 135–145)
TOTAL PROTEIN: 7.5 g/dL (ref 6.5–8.1)

## 2015-01-29 LAB — URINALYSIS COMPLETE WITH MICROSCOPIC (ARMC ONLY)
BILIRUBIN URINE: NEGATIVE
Bacteria, UA: NONE SEEN
GLUCOSE, UA: NEGATIVE mg/dL
Hgb urine dipstick: NEGATIVE
KETONES UR: NEGATIVE mg/dL
Leukocytes, UA: NEGATIVE
NITRITE: NEGATIVE
Protein, ur: NEGATIVE mg/dL
RBC / HPF: NONE SEEN RBC/hpf (ref 0–5)
Specific Gravity, Urine: 1.005 (ref 1.005–1.030)
Squamous Epithelial / LPF: NONE SEEN
pH: 6 (ref 5.0–8.0)

## 2015-01-29 LAB — CBC WITH DIFFERENTIAL/PLATELET
Basophils Absolute: 0.1 10*3/uL (ref 0–0.1)
Basophils Relative: 1 %
EOS PCT: 5 %
Eosinophils Absolute: 0.5 10*3/uL (ref 0–0.7)
HEMATOCRIT: 31.5 % — AB (ref 40.0–52.0)
Hemoglobin: 10.5 g/dL — ABNORMAL LOW (ref 13.0–18.0)
LYMPHS ABS: 1.1 10*3/uL (ref 1.0–3.6)
LYMPHS PCT: 12 %
MCH: 28.7 pg (ref 26.0–34.0)
MCHC: 33.2 g/dL (ref 32.0–36.0)
MCV: 86.3 fL (ref 80.0–100.0)
MONO ABS: 0.5 10*3/uL (ref 0.2–1.0)
Monocytes Relative: 5 %
NEUTROS ABS: 7.3 10*3/uL — AB (ref 1.4–6.5)
Neutrophils Relative %: 77 %
PLATELETS: 210 10*3/uL (ref 150–440)
RBC: 3.65 MIL/uL — AB (ref 4.40–5.90)
RDW: 16 % — ABNORMAL HIGH (ref 11.5–14.5)
WBC: 9.4 10*3/uL (ref 3.8–10.6)

## 2015-01-29 NOTE — ED Notes (Signed)
Reports burning with urination onset yesterday. Reports recently dc from [facility] for pneumonia.  Skin w/d, slightly jaundice

## 2015-01-29 NOTE — Discharge Instructions (Signed)
Dysuria Dysuria is the medical term for pain with urination. There are many causes for dysuria, but urinary tract infection is the most common. If a urinalysis was performed it can show that there is a urinary tract infection. A urine culture confirms that you or your child is sick. You will need to follow up with a healthcare provider because:  If a urine culture was done you will need to know the culture results and treatment recommendations.  If the urine culture was positive, you or your child will need to be put on antibiotics or know if the antibiotics prescribed are the right antibiotics for your urinary tract infection.  If the urine culture is negative (no urinary tract infection), then other causes may need to be explored or antibiotics need to be stopped. Today laboratory work may have been done and there does not seem to be an infection. If cultures were done they will take at least 24 to 48 hours to be completed. Today x-rays may have been taken and they read as normal. No cause can be found for the problems. The x-rays may be re-read by a radiologist and you will be contacted if additional findings are made. You or your child may have been put on medications to help with this problem until you can see your primary caregiver. If the problems get better, see your primary caregiver if the problems return. If you were given antibiotics (medications which kill germs), take all of the mediations as directed for the full course of treatment.  If laboratory work was done, you need to find the results. Leave a telephone number where you can be reached. If this is not possible, make sure you find out how you are to get test results. HOME CARE INSTRUCTIONS   Drink lots of fluids. For adults, drink eight, 8 ounce glasses of clear juice or water a day. For children, replace fluids as suggested by your caregiver.  Empty the bladder often. Avoid holding urine for long periods of time.  After a bowel  movement, women should cleanse front to back, using each tissue only once.  Empty your bladder before and after sexual intercourse.  Take all the medicine given to you until it is gone. You may feel better in a few days, but TAKE ALL MEDICINE.  Avoid caffeine, tea, alcohol and carbonated beverages, because they tend to irritate the bladder.  In men, alcohol may irritate the prostate.  Only take over-the-counter or prescription medicines for pain, discomfort, or fever as directed by your caregiver.  If your caregiver has given you a follow-up appointment, it is very important to keep that appointment. Not keeping the appointment could result in a chronic or permanent injury, pain, and disability. If there is any problem keeping the appointment, you must call back to this facility for assistance. SEEK IMMEDIATE MEDICAL CARE IF:   Back pain develops.  A fever develops.  There is nausea (feeling sick to your stomach) or vomiting (throwing up).  Problems are no better with medications or are getting worse. MAKE SURE YOU:   Understand these instructions.  Will watch your condition.  Will get help right away if you are not doing well or get worse. Document Released: 02/10/2004 Document Revised: 08/06/2011 Document Reviewed: 12/18/2007 [facility] Patient Information 2015 [place], [place]. This information is not intended to replace advice given to you by your health care provider. Make sure you discuss any questions you have with your health care provider.   Please return  immediately if condition worsens. Please contact her primary physician or the physician you were given for referral. If you have any specialist physicians involved in her treatment and plan please also contact them. Thank you for using [facility].

## 2015-01-29 NOTE — ED Provider Notes (Signed)
Time Seen: Approximately 1041  I have reviewed the triage notes  Chief Complaint: Urinary Frequency   History of Present Illness: William Bray is a 64 y.o. male who presents with complaints of burning with urination which started yesterday. Patient's recently been discharged from [facility] for workup of fever of questionable etiology. He was tentatively diagnosed with pneumonia though no obvious infiltrate was seen on his x-ray. He has a significant past history of having an LVAD. He also has a history of congestive heart failure. He states he's not noticed any hematuria. Denies any penile discharge or drainage. Denies any  problems with initiation of urination or incomplete voiding. He denies any new fevers at home. He denies any trouble with his bowels. Past Medical History  Diagnosis Date  . Arthritis   . Chronic back pain   . Diabetes mellitus without complication   . Neuropathy   . Hypertension     There are no active problems to display for this patient.   Past Surgical History  Procedure Laterality Date  . Pain pump revision      Past Surgical History  Procedure Laterality Date  . Pain pump revision      No current outpatient prescriptions on file.  Allergies:  Review of patient's allergies indicates no known allergies.  Family History: History reviewed. No pertinent family history.  Social History: Social History  Substance Use Topics  . Smoking status: Never Smoker   . Smokeless tobacco: None  . Alcohol Use: No     Review of Systems:   10 point review of systems was performed and was otherwise negative:  Constitutional: No fever Eyes: No visual disturbances ENT: No sore throat, ear pain Cardiac: No chest pain Respiratory: No cough, no wheezing, no increased shortness of breath. Abdomen: No abdominal pain, no vomiting, No diarrhea Endocrine: No weight loss, No night sweats Extremities: No peripheral edema, cyanosis Skin: No rashes,  easy bruising Neurologic: No focal weakness, trouble with speech or swollowing Urologic: Mentioned above   Physical Exam:  ED Triage Vitals  Enc Vitals Group     BP 01/29/15 1021 81/57 mmHg     Pulse Rate 01/29/15 1021 124     Resp 01/29/15 1021 20     Temp 01/29/15 1021 97.9 F (36.6 C)     Temp Source 01/29/15 1021 Oral     SpO2 01/29/15 1021 96 %     Weight 01/29/15 1021 180 lb (81.647 kg)     Height 01/29/15 1021 5' 9"  (1.753 m)     Head Cir --      Peak Flow --      Pain Score 01/29/15 1022 0     Pain Loc --      Pain Edu? --      Excl. in [place]? --     General: Awake , Alert , and Oriented times 3; GCS 15 Head: Normal cephalic , atraumatic Eyes: Pupils equal , round, reactive to light Nose/Throat: No nasal drainage, patent upper airway without erythema or exudate.  Neck: Supple, Full range of motion, No anterior adenopathy or palpable thyroid masses Lungs: Clear to ascultation without wheezes , rhonchi, or rales Heart: Regular rate, regular rhythm without murmurs , gallops , or rubs Abdomen: Soft, non tender without rebound, guarding , or rigidity; bowel sounds positive and symmetric in all 4 quadrants. No organomegaly .        Extremities: 2 plus symmetric pulses. No edema, clubbing or cyanosis Neurologic:  normal ambulation, Motor symmetric without deficits, sensory intact Skin: warm, dry, no rashes   Labs:   All laboratory work was reviewed including any pertinent negatives or positives listed below:  Labs Reviewed  CBC WITH DIFFERENTIAL/PLATELET - Abnormal; Notable for the following:    RBC 3.65 (*)    Hemoglobin 10.5 (*)    HCT 31.5 (*)    RDW 16.0 (*)    Neutro Abs 7.3 (*)    All other components within normal limits  COMPREHENSIVE METABOLIC PANEL - Abnormal; Notable for the following:    Glucose, Bld 121 (*)    BUN 23 (*)    Creatinine, Ser 1.37 (*)    GFR calc non Af Amer 53 (*)    All other components within normal limits  URINALYSIS COMPLETEWITH  MICROSCOPIC ([facility] ONLY) - Abnormal; Notable for the following:    Color, Urine STRAW (*)    APPearance CLEAR (*)    All other components within normal limits  URINE CULTURE   urine culture is pending. Creatinine is actually closer to baseline for the patient    ED Course: Patient's stay here was uneventful he is informed of his results and advised that the urine culture is pending. I'm not sure of the source of his dysuria at this time but does not appear to be a urinary tract infection and he was advised to continue with his Augmentin on an outpatient basis. Some appear to have any external lesions that would cause burning in urination and he denies any penile discharge or drainage. He also denies any other urinary symptoms such as retention or incontinence etc.    Assessment: Dysuria unknown etiology   Final Clinical Impression: Dysuria unknown etiology Final diagnoses:  Dysuria     Plan: Patient was advised to return immediately if condition worsens. Patient was advised to follow up with her primary care physician or other specialized physicians involved and in their current assessment.            Daymon Larsen, MD 01/29/15 [phone]

## 2015-03-02 DIAGNOSIS — F419 Anxiety disorder, unspecified: Secondary | ICD-10-CM | POA: Insufficient documentation

## 2015-03-14 DIAGNOSIS — M48062 Spinal stenosis, lumbar region with neurogenic claudication: Secondary | ICD-10-CM | POA: Insufficient documentation

## 2015-04-25 DIAGNOSIS — Z006 Encounter for examination for normal comparison and control in clinical research program: Secondary | ICD-10-CM | POA: Insufficient documentation

## 2015-06-15 DIAGNOSIS — H9190 Unspecified hearing loss, unspecified ear: Secondary | ICD-10-CM | POA: Insufficient documentation

## 2015-09-21 DIAGNOSIS — G59 Mononeuropathy in diseases classified elsewhere: Secondary | ICD-10-CM | POA: Insufficient documentation

## 2015-09-26 ENCOUNTER — Ambulatory Visit: Payer: Medicare Other | Admitting: Psychiatry

## 2015-10-23 DIAGNOSIS — I472 Ventricular tachycardia: Secondary | ICD-10-CM | POA: Insufficient documentation

## 2015-10-23 DIAGNOSIS — I4729 Other ventricular tachycardia: Secondary | ICD-10-CM | POA: Insufficient documentation

## 2015-10-26 DIAGNOSIS — I5023 Acute on chronic systolic (congestive) heart failure: Secondary | ICD-10-CM | POA: Insufficient documentation

## 2015-10-28 DIAGNOSIS — Z8719 Personal history of other diseases of the digestive system: Secondary | ICD-10-CM | POA: Insufficient documentation

## 2015-10-28 DIAGNOSIS — R040 Epistaxis: Secondary | ICD-10-CM | POA: Insufficient documentation

## 2016-01-02 ENCOUNTER — Encounter: Payer: Medicare Other | Attending: Internal Medicine | Admitting: *Deleted

## 2016-01-02 VITALS — Ht 69.5 in | Wt 182.3 lb

## 2016-01-02 DIAGNOSIS — Z955 Presence of coronary angioplasty implant and graft: Secondary | ICD-10-CM | POA: Diagnosis present

## 2016-01-02 DIAGNOSIS — Z95811 Presence of heart assist device: Secondary | ICD-10-CM | POA: Insufficient documentation

## 2016-01-02 DIAGNOSIS — I5022 Chronic systolic (congestive) heart failure: Secondary | ICD-10-CM | POA: Insufficient documentation

## 2016-01-02 DIAGNOSIS — E785 Hyperlipidemia, unspecified: Secondary | ICD-10-CM | POA: Insufficient documentation

## 2016-01-02 DIAGNOSIS — Z9861 Coronary angioplasty status: Secondary | ICD-10-CM | POA: Diagnosis not present

## 2016-01-02 NOTE — Patient Instructions (Signed)
Patient Instructions  Patient Details  Name: William Bray MRN: [id number] Date of Birth: [birth year] Referring Provider:  Charlynn Grimes, MD  Below are the personal goals you chose as well as exercise and nutrition goals. Our goal is to help you keep on track towards obtaining and maintaining your goals. We will be discussing your progress on these goals with you throughout the program.  Initial Exercise Prescription:     Initial Exercise Prescription - 01/02/16 1400      Date of Initial Exercise RX and Referring Provider   Date 01/02/16   Referring Provider Charlynn Grimes MD     Oxygen   Oxygen Continuous   Liters 2     Treadmill   MPH 1.4   Grade 0   Minutes 15   METs 2.07     NuStep   Level 1   Minutes 15   METs 2     REL-XR   Level 1   Minutes 15   METs 2     Prescription Details   Frequency (times per week) 3   Duration Progress to 45 minutes of aerobic exercise without signs/symptoms of physical distress     Intensity   THRR 40-80% of Max Heartrate 114-141   Ratings of Perceived Exertion 11-15   Perceived Dyspnea 0-4     Progression   Progression Continue to progress workloads to maintain intensity without signs/symptoms of physical distress.     Resistance Training   Training Prescription Yes   Weight 2 lbs   Reps 10-12      Exercise Goals: Frequency: Be able to perform aerobic exercise three times per week working toward 3-5 days per week.  Intensity: Work with a perceived exertion of 11 (fairly light) - 15 (hard) as tolerated. Follow your new exercise prescription and watch for changes in prescription as you progress with the program. Changes will be reviewed with you when they are made.  Duration: You should be able to do 30 minutes of continuous aerobic exercise in addition to a 5 minute warm-up and a 5 minute cool-down routine.  Nutrition Goals: Your personal nutrition goals will be established when you do your nutrition analysis with the  dietician.  The following are nutrition guidelines to follow: Cholesterol < 275m/day Sodium < 15060mday Fiber: Men over 50 yrs - 30 grams per day  Personal Goals:     Personal Goals and Risk Factors at Admission - 01/02/16 1408      Core Components/Risk Factors/Patient Goals on Admission    Weight Management Weight Maintenance   Sedentary Yes   Intervention Provide advice, education, support and counseling about physical activity/exercise needs.;Develop an individualized exercise prescription for aerobic and resistive training based on initial evaluation findings, risk stratification, comorbidities and participant's personal goals.   Expected Outcomes Achievement of increased cardiorespiratory fitness and enhanced flexibility, muscular endurance and strength shown through measurements of functional capacity and personal statement of participant.   Increase Strength and Stamina Yes   Intervention Provide advice, education, support and counseling about physical activity/exercise needs.;Develop an individualized exercise prescription for aerobic and resistive training based on initial evaluation findings, risk stratification, comorbidities and participant's personal goals.   Expected Outcomes Achievement of increased cardiorespiratory fitness and enhanced flexibility, muscular endurance and strength shown through measurements of functional capacity and personal statement of participant.   Improve shortness of breath with ADL's Yes   Intervention Provide education, individualized exercise plan and daily activity instruction to help decrease symptoms of SOB with activities  of daily living.   Expected Outcomes Short Term: Achieves a reduction of symptoms when performing activities of daily living.   Develop more efficient breathing techniques such as purse lipped breathing and diaphragmatic breathing; and practicing self-pacing with activity Yes   Intervention Provide education, demonstration and  support about specific breathing techniuqes utilized for more efficient breathing. Include techniques such as pursed lipped breathing, diaphragmatic breathing and self-pacing activity.   Expected Outcomes Short Term: Participant will be able to demonstrate and use breathing techniques as needed throughout daily activities.   Diabetes Yes   Intervention Provide education about signs/symptoms and action to take for hypo/hyperglycemia.;Provide education about proper nutrition, including hydration, and aerobic/resistive exercise prescription along with prescribed medications to achieve blood glucose in normal ranges: Fasting glucose 65-99 mg/dL   Expected Outcomes Short Term: Participant verbalizes understanding of the signs/symptoms and immediate care of hyper/hypoglycemia, proper foot care and importance of medication, aerobic/resistive exercise and nutrition plan for blood glucose control.;Long Term: Attainment of HbA1C < 7%.   Heart Failure Yes   Intervention Provide a combined exercise and nutrition program that is supplemented with education, support and counseling about heart failure. Directed toward relieving symptoms such as shortness of breath, decreased exercise tolerance, and extremity edema.   Expected Outcomes Improve functional capacity of life;Short term: Attendance in program 2-3 days a week with increased exercise capacity. Reported lower sodium intake. Reported increased fruit and vegetable intake. Reports medication compliance.;Short term: Daily weights obtained and reported for increase. Utilizing diuretic protocols set by physician.;Long term: Adoption of self-care skills and reduction of barriers for early signs and symptoms recognition and intervention leading to self-care maintenance.   Lipids Yes   Intervention Provide education and support for participant on nutrition & aerobic/resistive exercise along with prescribed medications to achieve LDL <41m, HDL >498m   Expected Outcomes  Short Term: Participant states understanding of desired cholesterol values and is compliant with medications prescribed. Participant is following exercise prescription and nutrition guidelines.;Long Term: Cholesterol controlled with medications as prescribed, with individualized exercise RX and with personalized nutrition plan. Value goals: LDL < 7082mHDL > 40 mg.   Stress Yes   Intervention Offer individual and/or small group education and counseling on adjustment to heart disease, stress management and health-related lifestyle change. Teach and support self-help strategies.;Refer participants experiencing significant psychosocial distress to appropriate mental health specialists for further evaluation and treatment. When possible, include family members and significant others in education/counseling sessions.   Expected Outcomes Short Term: Participant demonstrates changes in health-related behavior, relaxation and other stress management skills, ability to obtain effective social support, and compliance with psychotropic medications if prescribed.;Long Term: Emotional wellbeing is indicated by absence of clinically significant psychosocial distress or social isolation.      Tobacco Use Initial Evaluation: History  Smoking Status  . Never Smoker  Smokeless Tobacco  . Not on file    Copy of goals given to participant.

## 2016-01-02 NOTE — Progress Notes (Signed)
    [name] 01/02/16 1300         6 Minute Walk   Phase Initial     Distance 753 feet     Walk Time 5.31 minutes     # of Rest Breaks 3  For oxygen desaturation to 84%: 23 sec, 11 sec, 7 sec.  Pt made quick recovery each time     MPH 1.61     METS 2.25     RPE 13     Perceived Dyspnea  4     VO2 Peak 7.87     Symptoms Yes (comment)     Comments Shortness of Breath     Resting HR 86 bpm     Resting BP -  96 SBP     Max Ex. HR 119 bpm     Max Ex. BP -  102 SBP     2 Minute Post BP -  SBP 102, rck 86       Interval HR   Baseline HR 86     1 Minute HR 96     2 Minute HR 99     3 Minute HR 119     4 Minute HR 104     5 Minute HR 96     6 Minute HR 99     2 Minute Post HR 88     Interval Heart Rate? Yes       Interval Oxygen   Interval Oxygen? Yes     Baseline Oxygen Saturation % 92 %     Baseline Liters of Oxygen 0 L  Room Air     1 Minute Oxygen Saturation % 88 %     1 Minute Liters of Oxygen 0 L     2 Minute Oxygen Saturation % 87 %  at 1:51 84%     2 Minute Liters of Oxygen 0 L     3 Minute Oxygen Saturation % 87 %  at 3:14 84%     3 Minute Liters of Oxygen 0 L     4 Minute Oxygen Saturation % 86 %  at 4 min 85%     4 Minute Liters of Oxygen 0 L     5 Minute Oxygen Saturation % 92 %     5 Minute Liters of Oxygen 0 L     6 Minute Oxygen Saturation % 89 %     6 Minute Liters of Oxygen 0 L     2 Minute Post Oxygen Saturation % 97 %     2 Minute Post Liters of Oxygen 0 L       William Bray completed his six minute entry walk test today in cardiac rehab.  He was stopped three times for oxygen desaturation.  He dropped to 84% each time, but had a quick recovery to 90s (23 sec, 11 sec, and 7 sec).  He was able to complete walk on room air.  However, we will send a request to exercise on oxygen so that he can exercise continuously and maintain his oxygen saturation > 88%.  William Bray said that he felt short of breath and asked if it was his oxygen level  dropping.  We will continue to monitor William Bray's oxygen saturations during rehab. [name], MA, ACSM RCEP 01/02/2016 1:29 PM

## 2016-01-02 NOTE — Progress Notes (Signed)
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  albuterol (PROVENTIL) (2.5 MG/3ML) 0.083% nebulizer solution, Inhale 3 mLs into the lungs every 6 (six) hours as needed., Disp: , Rfl:  .  allopurinol (ZYLOPRIM) 100 MG tablet, Take 100 mg by mouth daily., Disp: , Rfl:  .  ALPRAZolam (XANAX) 0.5 MG tablet, Take 0.5 mg by mouth 2 (two) times daily as needed. ONE IN THE MORNING AND ONE AT BEDTIME PRN, Disp: , Rfl:  .  budesonide (PULMICORT) 0.5 MG/2ML nebulizer solution, Inhale 2 mLs into the lungs 2 (two) times daily., Disp: , Rfl:  .  cetirizine (ZYRTEC) 10 MG tablet, Take 10 mg by mouth daily., Disp: , Rfl:  .  ferrous sulfate 324 (65 Fe) MG TBEC, Take 324 mg by mouth every morning. WITH BREAKFAST, Disp: , Rfl:  .  FLUoxetine (PROZAC) 20 MG capsule, Take 80 mg by mouth daily., Disp: , Rfl:  .  gabapentin (NEURONTIN) 100 MG capsule, Take 100 mg by mouth daily., Disp: , Rfl:  .  gabapentin (NEURONTIN) 300 MG capsule, Take 300 mg by mouth at bedtime., Disp: , Rfl:  .  lisinopril (PRINIVIL,ZESTRIL) 2.5 MG tablet, Take 2.5 mg by mouth daily., Disp: , Rfl:  .  metoprolol tartrate (LOPRESSOR) 25 MG tablet, Take 25 mg by mouth 2 (two) times daily., Disp: , Rfl:  .  mirtazapine (REMERON) 30 MG tablet, Take 0.5 tablets by mouth at bedtime., Disp: , Rfl:  .  montelukast (SINGULAIR) 10 MG tablet, Take 10 mg by mouth at bedtime., Disp: ,  Rfl:  .  ondansetron (ZOFRAN) 4 MG tablet, Take 4 mg by mouth every 8 (eight) hours as needed for nausea., Disp: , Rfl:  .  pantoprazole (PROTONIX) 40 MG tablet, Take 40 mg by mouth daily., Disp: , Rfl:  .  pravastatin (PRAVACHOL) 40 MG tablet, Take 40 mg by mouth at bedtime., Disp: , Rfl:  .  spironolactone (ALDACTONE) 25 MG tablet, Take 25 mg by mouth daily., Disp: , Rfl:  .  torsemide (DEMADEX) 20 MG tablet, Take 2 tablets by mouth daily., Disp: , Rfl:  .  umeclidinium-vilanterol (ANORO ELLIPTA) 62.5-25 MCG/INH AEPB, Inhale 1 puff into the lungs daily., Disp: , Rfl:  .  warfarin (COUMADIN) 5 MG tablet, Take 5 mg by mouth as directed. Taking 5 mg M, W, F and 4 mg T, Th, Sat, Sun., Disp: , Rfl:  .  aspirin EC 81 MG tablet, Take 81 mg by mouth daily., Disp: , Rfl:  .  [START ON 01/13/2016] oxyCODONE (OXY IR/ROXICODONE) 5 MG immediate release tablet, Take 1 tablet by mouth 4 (four) times daily as needed., Disp: , Rfl:   Past Medical History: Past Medical History:  Diagnosis Date  . Arthritis   . Chronic back pain   . Diabetes mellitus without complication   . Hypertension   . Neuropathy     Tobacco Use: History  Smoking Status  .  Never Smoker  Smokeless Tobacco  . Not on file    Labs: Recent Review Flowsheet Data    Labs for ITP Cardiac and Pulmonary Rehab Latest Ref Rng & Units 08/08/2012 04/28/2013 11/05/2013 05/30/2014 06/02/2014   Cholestrol 0 - 200 mg/dL 105 - 127 64 -   LDLCALC 0 - 100 mg/dL 37 - 74 27 -   HDL 40 - 60 mg/dL 33(L) - 33(L) 15(L) -   Trlycerides 0 - 200 mg/dL 173 - 99 108 -   Hemoglobin A1c 4.2 - 6.3 % - 5.9 - - 5.3       Exercise Target Goals: Date: 01/02/16  Exercise Program Goal: Individual exercise prescription set with THRR, safety & activity barriers. Participant demonstrates ability to understand and report RPE using BORG scale, to self-measure pulse accurately, and to acknowledge the importance of the exercise prescription.  Exercise Prescription  Goal: Starting with aerobic activity 30 plus minutes a day, 3 days per week for initial exercise prescription. Provide home exercise prescription and guidelines that participant acknowledges understanding prior to discharge.  Activity Barriers & Risk Stratification:     Activity Barriers & Cardiac Risk Stratification - 01/02/16 1357      Activity Barriers & Cardiac Risk Stratification   Activity Barriers Arthritis;Back Problems;Shortness of Breath;Deconditioning;Muscular Weakness;Other (comment)   Comments William Bray has LVAD, Pacemaker, and Defibrillator.  William Bray is on mutilple meds that could affect his BP and make him dizzy.   Must use doppler to check BP.     Cardiac Risk Stratification High      6 Minute Walk:     6 Minute Walk    Row Name 01/02/16 1300         6 Minute Walk   Phase Initial     Distance 753 feet     Walk Time 5.31 minutes     # of Rest Breaks 3  For oxygen desaturation to 84%: 23 sec, 11 sec, 7 sec.  Pt made quick recovery each time     MPH 1.61     METS 2.25     RPE 13     Perceived Dyspnea  4     VO2 Peak 7.87     Symptoms Yes (comment)     Comments Shortness of Breath     Resting HR 86 bpm     Resting BP -  96 SBP     Max Ex. HR 119 bpm     Max Ex. BP -  102 SBP     2 Minute Post BP -  SBP 102, rck 86       Interval HR   Baseline HR 86     1 Minute HR 96     2 Minute HR 99     3 Minute HR 119     4 Minute HR 104     5 Minute HR 96     6 Minute HR 99     2 Minute Post HR 88     Interval Heart Rate? Yes       Interval Oxygen   Interval Oxygen? Yes     Baseline Oxygen Saturation % 92 %     Baseline Liters of Oxygen 0 L  Room Air     1 Minute Oxygen Saturation % 88 %     1 Minute Liters of Oxygen 0 L     2 Minute Oxygen Saturation % 87 %  at 1:51 84%     2 Minute  Liters of Oxygen 0 L     3 Minute Oxygen Saturation % 87 %  at 3:14 84%     3 Minute Liters of Oxygen 0 L     4 Minute Oxygen Saturation % 86 %  at 4 min 85%     4 Minute  Liters of Oxygen 0 L     5 Minute Oxygen Saturation % 92 %     5 Minute Liters of Oxygen 0 L     6 Minute Oxygen Saturation % 89 %     6 Minute Liters of Oxygen 0 L     2 Minute Post Oxygen Saturation % 97 %     2 Minute Post Liters of Oxygen 0 L        Initial Exercise Prescription:     Initial Exercise Prescription - 01/02/16 1400      Date of Initial Exercise RX and Referring Provider   Date 01/02/16   Referring Provider Charlynn Grimes MD     Oxygen   Oxygen Continuous   Liters 2     Treadmill   MPH 1.4   Grade 0   Minutes 15   METs 2.07     NuStep   Level 1   Minutes 15   METs 2     REL-XR   Level 1   Minutes 15   METs 2     Prescription Details   Frequency (times per week) 3   Duration Progress to 45 minutes of aerobic exercise without signs/symptoms of physical distress     Intensity   THRR 40-80% of Max Heartrate 114-141   Ratings of Perceived Exertion 11-15   Perceived Dyspnea 0-4     Progression   Progression Continue to progress workloads to maintain intensity without signs/symptoms of physical distress.     Resistance Training   Training Prescription Yes   Weight 2 lbs   Reps 10-12      Perform Capillary Blood Glucose checks as needed.  Exercise Prescription Changes:   Exercise Comments:     Exercise Comments    Row Name 01/02/16 1329           Exercise Comments William Bray was to be more physcially healthy and more active by the time he finishes the program.          Discharge Exercise Prescription (Final Exercise Prescription Changes):   Nutrition:  Target Goals: Understanding of nutrition guidelines, daily intake of sodium <1557m, cholesterol <2034m calories 30% from fat and 7% or less from saturated fats, daily to have 5 or more servings of fruits and vegetables.  Biometrics:     Pre Biometrics - 01/02/16 1449      Pre Biometrics   Height 5' 9.5" (1.765 m)   Weight 182 lb 4.8 oz (82.7 kg)   Waist Circumference 39  inches   Hip Circumference 40 inches   Waist to Hip Ratio 0.98 %   BMI (Calculated) 26.6       Nutrition Therapy Plan and Nutrition Goals:   Nutrition Discharge: Rate Your Plate Scores:   Nutrition Goals Re-Evaluation:   Psychosocial: Target Goals: Acknowledge presence or absence of depression, maximize coping skills, provide positive support system. Participant is able to verbalize types and ability to use techniques and skills needed for reducing stress and depression.  Initial Review & Psychosocial Screening:     Initial Psych Review & Screening - 01/02/16 1412      Initial Review   Current issues  with History of Depression;Current Psychotropic Meds     Family Dynamics   Good Support System? Yes   Comments William Bray has been married to his wife, William Bray, for 44 years.  He describes her as an Building services engineer.  They have one son who is a paramedic and one daughter one is an Therapist, sports.  No grandchildren. William Bray does not have a church affiliation, but states his wife does.       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  He has undergone counseling in the past for this reason.  He is currently on Prozac 80 mg once a day and Xanax 0.5 mg in the morning and 0.5 mg at night as needed.        Screening Interventions   Interventions Encouraged to exercise;Program counselor consult      Quality of Life Scores:     Quality of Life - 01/02/16 1521      Quality of Life Scores   Health/Function Pre 19.6 %   Socioeconomic Pre 22.5 %   Psych/Spiritual Pre 22.93 %   Family Pre 27.8 %   GLOBAL Pre 22.1 %      PHQ-9: Recent Review Flowsheet Data    Depression screen [facility] 2/9 01/02/2016   Decreased Interest 0   Down, Depressed, Hopeless 1   PHQ - 2 Score 1   Altered sleeping 2   Tired, decreased energy 1   Change in appetite 0   Feeling bad or  failure about yourself  1   Trouble concentrating 1   Moving slowly or fidgety/restless 1   Suicidal thoughts 0   PHQ-9 Score 7   Difficult doing work/chores Somewhat difficult      Psychosocial Evaluation and Intervention:   Psychosocial Re-Evaluation:   Vocational Rehabilitation: Provide vocational rehab assistance to qualifying candidates.   Vocational Rehab Evaluation & Intervention:     Vocational Rehab - 01/02/16 1401      Initial Vocational Rehab Evaluation & Intervention   Assessment shows need for Vocational Rehabilitation No      Education: Education Goals: Education classes will be provided on a weekly basis, covering required topics. Participant will state understanding/return demonstration of topics presented.  Learning Barriers/Preferences:     Learning Barriers/Preferences - 01/02/16 1401      Learning Barriers/Preferences   Learning Preferences None      Education Topics: General Nutrition Guidelines/Fats and Fiber: -Group instruction provided by verbal, written material, models and posters to present the general guidelines for heart healthy nutrition. Gives an explanation and review of dietary fats and fiber.   Controlling Sodium/Reading Food Labels: -Group verbal and written material supporting the discussion of sodium use in heart healthy nutrition. Review and explanation with models, verbal and written materials for utilization of the food label.   Exercise Physiology & Risk Factors: - Group verbal and written instruction with models to review the exercise physiology of the cardiovascular system and associated critical values. Details cardiovascular disease risk factors and the goals associated with each risk factor.   Aerobic Exercise & Resistance Training: - Gives group verbal and written discussion on the health impact of inactivity. On the components of aerobic and resistive training programs and the benefits of this training and how to  safely progress through these programs.   Flexibility, Balance, General Exercise Guidelines: - Provides group verbal and written instruction on  the benefits of flexibility and balance training programs. Provides general exercise guidelines with specific guidelines to those with heart or lung disease. Demonstration and skill practice provided.   Stress Management: - Provides group verbal and written instruction about the health risks of elevated stress, cause of high stress, and healthy ways to reduce stress.   Depression: - Provides group verbal and written instruction on the correlation between heart/lung disease and depressed mood, treatment options, and the stigmas associated with seeking treatment.   Anatomy & Physiology of the Heart: - Group verbal and written instruction and models provide basic cardiac anatomy and physiology, with the coronary electrical and arterial systems. Review of: AMI, Angina, Valve disease, Heart Failure, Cardiac Arrhythmia, Pacemakers, and the ICD.   Cardiac Procedures: - Group verbal and written instruction and models to describe the testing methods done to diagnose heart disease. Reviews the outcomes of the test results. Describes the treatment choices: Medical Management, Angioplasty, or Coronary Bypass Surgery.   Cardiac Medications: - Group verbal and written instruction to review commonly prescribed medications for heart disease. Reviews the medication, class of the drug, and side effects. Includes the steps to properly store meds and maintain the prescription regimen.   Go Sex-Intimacy & Heart Disease, Get SMART - Goal Setting: - Group verbal and written instruction through game format to discuss heart disease and the return to sexual intimacy. Provides group verbal and written material to discuss and apply goal setting through the application of the S.M.A.R.T. Method.   Other Matters of the Heart: - Provides group verbal, written materials and  models to describe Heart Failure, Angina, Valve Disease, and Diabetes in the realm of heart disease. Includes description of the disease process and treatment options available to the cardiac patient.   Exercise & Equipment Safety: - Individual verbal instruction and demonstration of equipment use and safety with use of the equipment. Flowsheet Row Cardiac Rehab from 01/02/2016 in [facility] Cardiac and Pulmonary Rehab  Date  01/02/16  Educator  D. Joya Gaskins, RN  Instruction Review Code  1- partially meets, needs review/practice      Infection Prevention: - Provides verbal and written material to individual with discussion of infection control including proper hand washing and proper equipment cleaning during exercise session. Flowsheet Row Cardiac Rehab from 01/02/2016 in [facility] Cardiac and Pulmonary Rehab  Date  01/02/16  Educator  D. Joya Gaskins, RN  Instruction Review Code  2- meets goals/outcomes      Falls Prevention: - Provides verbal and written material to individual with discussion of falls prevention and safety. Flowsheet Row Cardiac Rehab from 01/02/2016 in [facility] Cardiac and Pulmonary Rehab  Date  01/02/16  Educator  D. Joya Gaskins, RN  Instruction Review Code  2- meets goals/outcomes      Diabetes: - Individual verbal and written instruction to review signs/symptoms of diabetes, desired ranges of glucose level fasting, after meals and with exercise. Advice that pre and post exercise glucose checks will be done for 3 sessions at entry of program. [place] from 01/02/2016 in [facility] Cardiac and Pulmonary Rehab  Date  01/02/16  Educator  D. Joya Gaskins, RN  Instruction Review Code  2- meets goals/outcomes       Knowledge Questionnaire Score:     Knowledge Questionnaire Score - 01/02/16 1424      Knowledge Questionnaire Score   Pre Score 20/28      Core Components/Risk Factors/Patient Goals at Admission:     Personal Goals and Risk Factors at Admission - 01/02/16  1408       Core Components/Risk Factors/Patient Goals on Admission    Weight Management Weight Maintenance   Sedentary Yes   Intervention Provide advice, education, support and counseling about physical activity/exercise needs.;Develop an individualized exercise prescription for aerobic and resistive training based on initial evaluation findings, risk stratification, comorbidities and participant's personal goals.   Expected Outcomes Achievement of increased cardiorespiratory fitness and enhanced flexibility, muscular endurance and strength shown through measurements of functional capacity and personal statement of participant.   Increase Strength and Stamina Yes   Intervention Provide advice, education, support and counseling about physical activity/exercise needs.;Develop an individualized exercise prescription for aerobic and resistive training based on initial evaluation findings, risk stratification, comorbidities and participant's personal goals.   Expected Outcomes Achievement of increased cardiorespiratory fitness and enhanced flexibility, muscular endurance and strength shown through measurements of functional capacity and personal statement of participant.   Improve shortness of breath with ADL's Yes   Intervention Provide education, individualized exercise plan and daily activity instruction to help decrease symptoms of SOB with activities of daily living.   Expected Outcomes Short Term: Achieves a reduction of symptoms when performing activities of daily living.   Develop more efficient breathing techniques such as purse lipped breathing and diaphragmatic breathing; and practicing self-pacing with activity Yes   Intervention Provide education, demonstration and support about specific breathing techniuqes utilized for more efficient breathing. Include techniques such as pursed lipped breathing, diaphragmatic breathing and self-pacing activity.   Expected Outcomes Short Term: Participant will be able  to demonstrate and use breathing techniques as needed throughout daily activities.   Diabetes Yes   Intervention Provide education about signs/symptoms and action to take for hypo/hyperglycemia.;Provide education about proper nutrition, including hydration, and aerobic/resistive exercise prescription along with prescribed medications to achieve blood glucose in normal ranges: Fasting glucose 65-99 mg/dL   Expected Outcomes Short Term: Participant verbalizes understanding of the signs/symptoms and immediate care of hyper/hypoglycemia, proper foot care and importance of medication, aerobic/resistive exercise and nutrition plan for blood glucose control.;Long Term: Attainment of HbA1C < 7%.   Heart Failure Yes   Intervention Provide a combined exercise and nutrition program that is supplemented with education, support and counseling about heart failure. Directed toward relieving symptoms such as shortness of breath, decreased exercise tolerance, and extremity edema.   Expected Outcomes Improve functional capacity of life;Short term: Attendance in program 2-3 days a week with increased exercise capacity. Reported lower sodium intake. Reported increased fruit and vegetable intake. Reports medication compliance.;Short term: Daily weights obtained and reported for increase. Utilizing diuretic protocols set by physician.;Long term: Adoption of self-care skills and reduction of barriers for early signs and symptoms recognition and intervention leading to self-care maintenance.   Lipids Yes   Intervention Provide education and support for participant on nutrition & aerobic/resistive exercise along with prescribed medications to achieve LDL <100m, HDL >475m   Expected Outcomes Short Term: Participant states understanding of desired cholesterol values and is compliant with medications prescribed. Participant is following exercise prescription and nutrition guidelines.;Long Term: Cholesterol controlled with medications  as prescribed, with individualized exercise RX and with personalized nutrition plan. Value goals: LDL < 7014mHDL > 40 mg.   Stress Yes   Intervention Offer individual and/or small group education and counseling on adjustment to heart disease, stress management and health-related lifestyle change. Teach and support self-help strategies.;Refer participants experiencing significant psychosocial distress to appropriate mental health specialists for further evaluation and treatment. When possible, include family members and significant others  in education/counseling sessions.   Expected Outcomes Short Term: Participant demonstrates changes in health-related behavior, relaxation and other stress management skills, ability to obtain effective social support, and compliance with psychotropic medications if prescribed.;Long Term: Emotional wellbeing is indicated by absence of clinically significant psychosocial distress or social isolation.      Core Components/Risk Factors/Patient Goals Review:    Core Components/Risk Factors/Patient Goals at Discharge (Final Review):    ITP Comments:     ITP Comments    Row Name 01/02/16 1444 01/02/16 1501         ITP Comments William Bray has LVAD present.  Being admitted to Cardiac Rehab for Chronic Systolic Heart Failure.  William Bray also has COPD and Sleep Apnea.  ECG today with atrial fibrillation, frequent PVCs, and occasional ventricular pacing.  William Bray's oxygen saturation dropped to 84% during 6 minute walk, but he recovered quickly.  William Bray states he has CPAP at home with1 liter of oxygen.  Occasionally he will use oxygen during the day for brief period if he is SOB from an activity.  SOB at intervals during 6 minute walk today.  No other cardiac symptoms reported or observed during orientation/med review.   William Bray has LVAD present.  Being admitted to Cardiac Rehab for Chronic Systolic Heart Failure.  William Bray also has COPD and Sleep Apnea.  ECG today with atrial fibrillation,  frequent PVCs, and occasional ventricular pacing.  [place]'s oxygen saturation dropped to 84% during 6 minute walk, but he recovered quickly.  [place] states he has CPAP at home with1 liter of oxygen.  Occasionally he will use oxygen during the day for brief period if he is SOB from an activity. Will reach out to Dr. Stann Mainland for an order or script about the possibility of titrating oxygen during cardiac rehab.   SOB at intervals during 6 minute walk today.  No other cardiac symptoms reported or observed during orientation/med review.           Comments:  [place] will start Cardiac Rehab on Friday, January 06, 2016 at 7:30 a.m. Awaiting order from Dr. Stann Mainland to titrate oxygen during exercise.

## 2016-01-06 ENCOUNTER — Encounter: Payer: Medicare Other | Admitting: *Deleted

## 2016-01-06 DIAGNOSIS — Z955 Presence of coronary angioplasty implant and graft: Secondary | ICD-10-CM | POA: Diagnosis not present

## 2016-01-06 DIAGNOSIS — Z95811 Presence of heart assist device: Secondary | ICD-10-CM

## 2016-01-06 DIAGNOSIS — I5022 Chronic systolic (congestive) heart failure: Secondary | ICD-10-CM

## 2016-01-06 LAB — GLUCOSE, CAPILLARY
GLUCOSE-CAPILLARY: 189 mg/dL — AB (ref 65–99)
GLUCOSE-CAPILLARY: 79 mg/dL (ref 65–99)

## 2016-01-06 NOTE — Patient Instructions (Signed)
Patient Instructions  Patient Details  Name: William Bray MRN: [id number] Date of Birth: Oct 11, 1950 Referring Provider:  No ref. provider found  Below are the personal goals you chose as well as exercise and nutrition goals. Our goal is to help you keep on track towards obtaining and maintaining your goals. We will be discussing your progress on these goals with you throughout the program.  Initial Exercise Prescription:     Initial Exercise Prescription - 01/02/16 1400      Date of Initial Exercise RX and Referring Provider   Date 01/02/16   Referring Provider Charlynn Grimes MD     Oxygen   Oxygen Continuous   Liters 2     Treadmill   MPH 1.4   Grade 0   Minutes 15   METs 2.07     NuStep   Level 1   Minutes 15   METs 2     REL-XR   Level 1   Minutes 15   METs 2     Prescription Details   Frequency (times per week) 3   Duration Progress to 45 minutes of aerobic exercise without signs/symptoms of physical distress     Intensity   THRR 40-80% of Max Heartrate 114-141   Ratings of Perceived Exertion 11-15   Perceived Dyspnea 0-4     Progression   Progression Continue to progress workloads to maintain intensity without signs/symptoms of physical distress.     Resistance Training   Training Prescription Yes   Weight 2 lbs   Reps 10-12      Exercise Goals: Frequency: Be able to perform aerobic exercise three times per week working toward 3-5 days per week.  Intensity: Work with a perceived exertion of 11 (fairly light) - 15 (hard) as tolerated. Follow your new exercise prescription and watch for changes in prescription as you progress with the program. Changes will be reviewed with you when they are made.  Duration: You should be able to do 30 minutes of continuous aerobic exercise in addition to a 5 minute warm-up and a 5 minute cool-down routine.  Nutrition Goals: Your personal nutrition goals will be established when you do your nutrition analysis with  the dietician.  The following are nutrition guidelines to follow: Cholesterol < 220m/day Sodium < 15072mday Fiber: Men over 50 yrs - 30 grams per day  Personal Goals:     Personal Goals and Risk Factors at Admission - 01/02/16 1408      Core Components/Risk Factors/Patient Goals on Admission    Weight Management Weight Maintenance   Sedentary Yes   Intervention Provide advice, education, support and counseling about physical activity/exercise needs.;Develop an individualized exercise prescription for aerobic and resistive training based on initial evaluation findings, risk stratification, comorbidities and participant's personal goals.   Expected Outcomes Achievement of increased cardiorespiratory fitness and enhanced flexibility, muscular endurance and strength shown through measurements of functional capacity and personal statement of participant.   Increase Strength and Stamina Yes   Intervention Provide advice, education, support and counseling about physical activity/exercise needs.;Develop an individualized exercise prescription for aerobic and resistive training based on initial evaluation findings, risk stratification, comorbidities and participant's personal goals.   Expected Outcomes Achievement of increased cardiorespiratory fitness and enhanced flexibility, muscular endurance and strength shown through measurements of functional capacity and personal statement of participant.   Improve shortness of breath with ADL's Yes   Intervention Provide education, individualized exercise plan and daily activity instruction to help decrease symptoms of SOB with  activities of daily living.   Expected Outcomes Short Term: Achieves a reduction of symptoms when performing activities of daily living.   Develop more efficient breathing techniques such as purse lipped breathing and diaphragmatic breathing; and practicing self-pacing with activity Yes   Intervention Provide education, demonstration  and support about specific breathing techniuqes utilized for more efficient breathing. Include techniques such as pursed lipped breathing, diaphragmatic breathing and self-pacing activity.   Expected Outcomes Short Term: Participant will be able to demonstrate and use breathing techniques as needed throughout daily activities.   Diabetes Yes   Intervention Provide education about signs/symptoms and action to take for hypo/hyperglycemia.;Provide education about proper nutrition, including hydration, and aerobic/resistive exercise prescription along with prescribed medications to achieve blood glucose in normal ranges: Fasting glucose 65-99 mg/dL   Expected Outcomes Short Term: Participant verbalizes understanding of the signs/symptoms and immediate care of hyper/hypoglycemia, proper foot care and importance of medication, aerobic/resistive exercise and nutrition plan for blood glucose control.;Long Term: Attainment of HbA1C < 7%.   Heart Failure Yes   Intervention Provide a combined exercise and nutrition program that is supplemented with education, support and counseling about heart failure. Directed toward relieving symptoms such as shortness of breath, decreased exercise tolerance, and extremity edema.   Expected Outcomes Improve functional capacity of life;Short term: Attendance in program 2-3 days a week with increased exercise capacity. Reported lower sodium intake. Reported increased fruit and vegetable intake. Reports medication compliance.;Short term: Daily weights obtained and reported for increase. Utilizing diuretic protocols set by physician.;Long term: Adoption of self-care skills and reduction of barriers for early signs and symptoms recognition and intervention leading to self-care maintenance.   Lipids Yes   Intervention Provide education and support for participant on nutrition & aerobic/resistive exercise along with prescribed medications to achieve LDL <61m, HDL >485m   Expected  Outcomes Short Term: Participant states understanding of desired cholesterol values and is compliant with medications prescribed. Participant is following exercise prescription and nutrition guidelines.;Long Term: Cholesterol controlled with medications as prescribed, with individualized exercise RX and with personalized nutrition plan. Value goals: LDL < 7040mHDL > 40 mg.   Stress Yes   Intervention Offer individual and/or small group education and counseling on adjustment to heart disease, stress management and health-related lifestyle change. Teach and support self-help strategies.;Refer participants experiencing significant psychosocial distress to appropriate mental health specialists for further evaluation and treatment. When possible, include family members and significant others in education/counseling sessions.   Expected Outcomes Short Term: Participant demonstrates changes in health-related behavior, relaxation and other stress management skills, ability to obtain effective social support, and compliance with psychotropic medications if prescribed.;Long Term: Emotional wellbeing is indicated by absence of clinically significant psychosocial distress or social isolation.      Tobacco Use Initial Evaluation: History  Smoking Status  . Never Smoker  Smokeless Tobacco  . Not on file    Copy of goals given to participant.

## 2016-01-06 NOTE — Progress Notes (Signed)
Daily Session Note  Patient Details  Name: William Bray MRN: [id number] Date of Birth: [birth year] Referring Provider:   Flowsheet Row Cardiac Rehab from 01/02/2016 in [facility] Cardiac and Pul[facility] Rehab  Referring Provider  Charlynn Grimes MD      Encounter Date: 01/06/2016  Check In:     Session Check In - 01/06/16 1006      Check-In   Location [facility]-Cardiac & Pulmonary Rehab   Staff Present Heath Lark, RN, BSN, CCRP;Pearley Baranek [place], MA, ACSM RCEP, Exercise Physiologist;Carroll Enterkin, RN, BSN   Supervising physician immediately available to respond to emergencies See telemetry face sheet for immediately available ER MD   Medication changes reported     No   Fall or balance concerns reported    No   Warm-up and Cool-down Performed on first and last piece of equipment   Resistance Training Performed Yes   VAD Patient? Yes     VAD patient   Has back up controller? Yes   Has spare charged batteries? Yes   Has battery cables? Yes   Has compatible battery clips? Yes     Pain Assessment   Currently in Pain? No/denies   Multiple Pain Sites No         Goals Met:  Exercise tolerated well Personal goals reviewed No report of cardiac concerns or symptoms Strength training completed today  Goals Unmet:  Not Applicable  Comments: First full day of exercise!  Patient was oriented to gym and equipment including functions, settings, policies, and procedures.  Patient's individual exercise prescription and treatment plan were reviewed.  All starting workloads were established based on the results of the 6 minute walk test done at initial orientation visit.  The plan for exercise progression was also introduced and progression will be customized based on patient's performance and goals.    Dr. Emily Filbert is Medical Director for [place] and [name].

## 2016-01-09 ENCOUNTER — Encounter: Payer: Medicare Other | Admitting: *Deleted

## 2016-01-09 DIAGNOSIS — I5022 Chronic systolic (congestive) heart failure: Secondary | ICD-10-CM

## 2016-01-09 DIAGNOSIS — Z95811 Presence of heart assist device: Secondary | ICD-10-CM

## 2016-01-09 DIAGNOSIS — Z955 Presence of coronary angioplasty implant and graft: Secondary | ICD-10-CM | POA: Diagnosis not present

## 2016-01-09 LAB — GLUCOSE, CAPILLARY
Glucose-Capillary: 189 mg/dL — ABNORMAL HIGH (ref 65–99)
Glucose-Capillary: 105 mg/dL — ABNORMAL HIGH (ref 65–99)

## 2016-01-09 IMAGING — CR DG CHEST 1V PORT
1 series · 1 of 1 positions shown · non-contrast
Comparison: Earlier same day

CLINICAL DATA: Evaluate IJ placement.

EXAM:
PORTABLE CHEST - 1 VIEW

[ap]
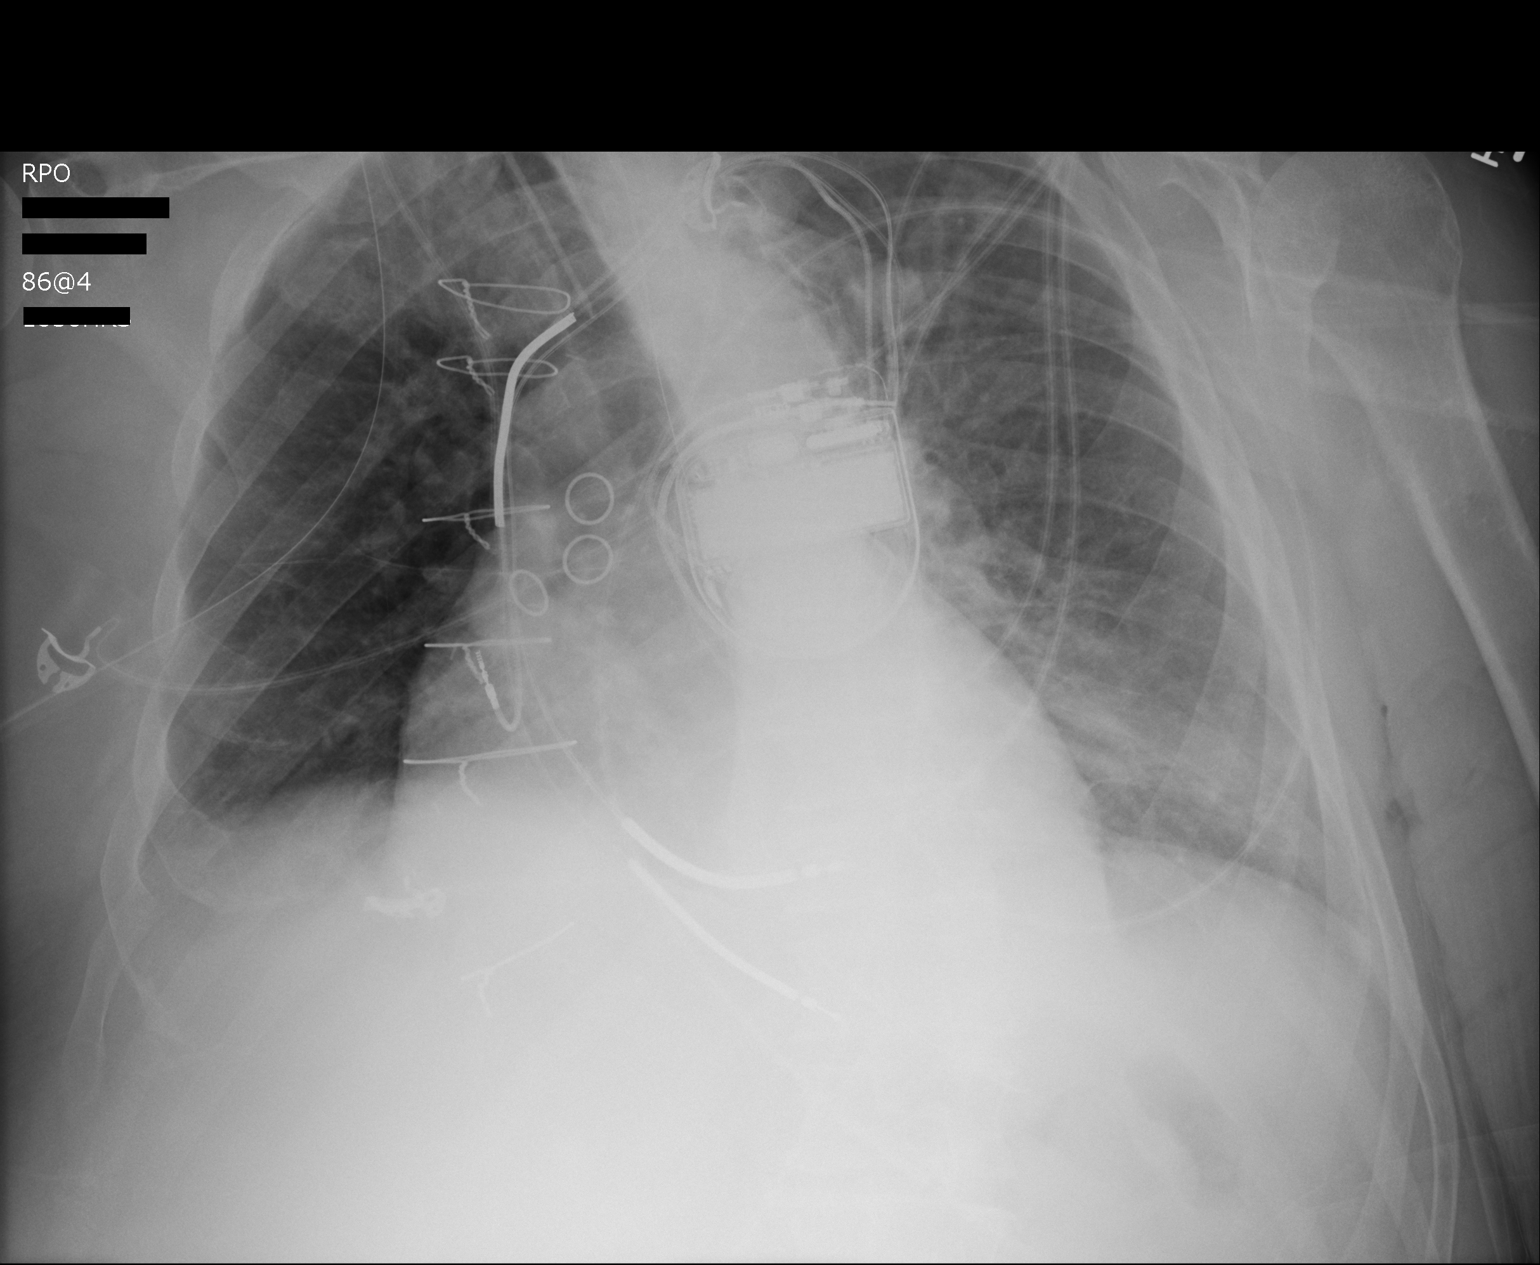

[1 of 1 positions shown; findings below may reference images not displayed]

FINDINGS: Multiple leads overlie the patient. Multi lead pacer apparatus
projects over the chest. Endotracheal tube terminates within the mid
trachea. The distal aspect of the right internal jugular catheter is
obscured due to overlying EKG leads. Distal aspect of the NG tube is
not visualized on current evaluation. Stable cardiomegaly. Unchanged
bilateral mid lower lung heterogeneous opacities.
IMPRESSION: ET tube terminates in the mid trachea.

Unable to evaluate the distal aspect of the right internal jugular
central venous catheter as it is obscured due to overlying EKG
leads.

Distal aspect of the NG tube not visualized on current evaluation.

## 2016-01-09 IMAGING — CR DG CHEST 1V PORT
1 series · 2 of 2 positions shown · non-contrast
Comparison: 04/30/2014

CLINICAL DATA: NG tube, internal jugular line, endotracheal tube
placement

EXAM:
PORTABLE CHEST - 1 VIEW

[Series 1: ap · 0.17mm/px · 2 of 2 slices shown]
[im 1/2]
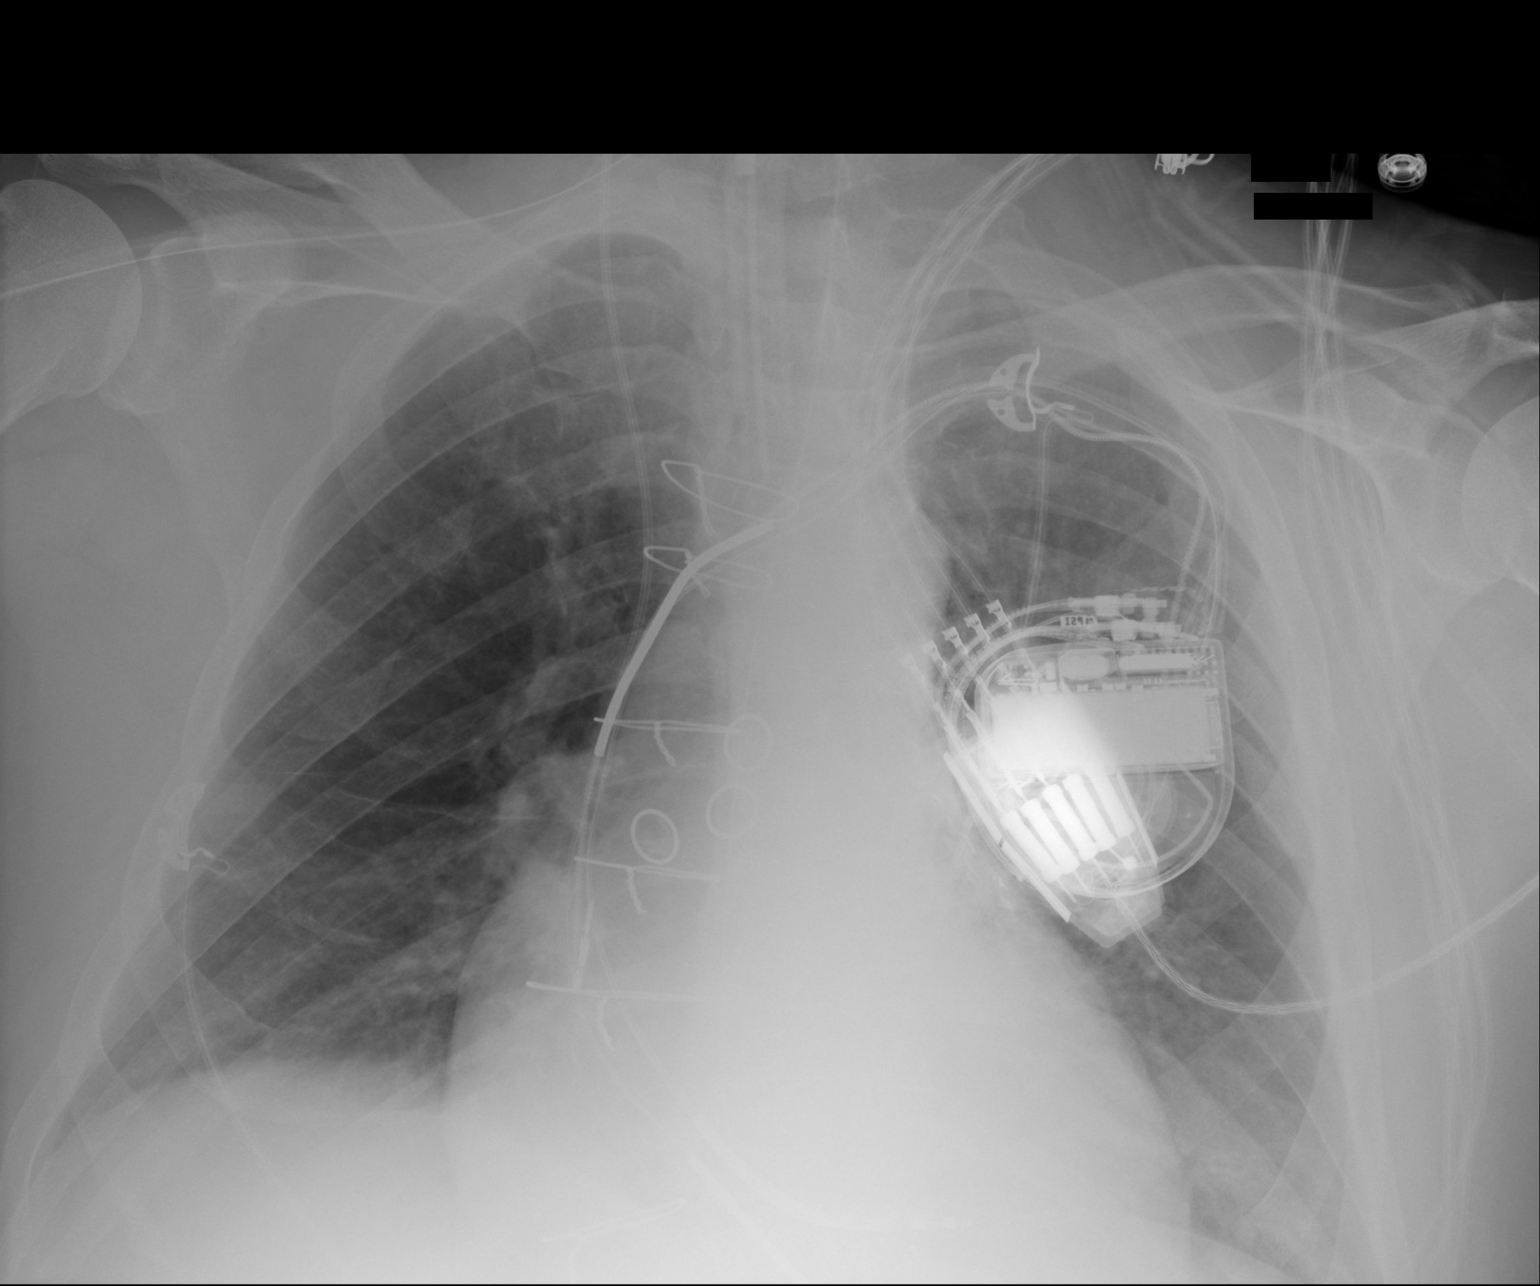
[im 2/2]
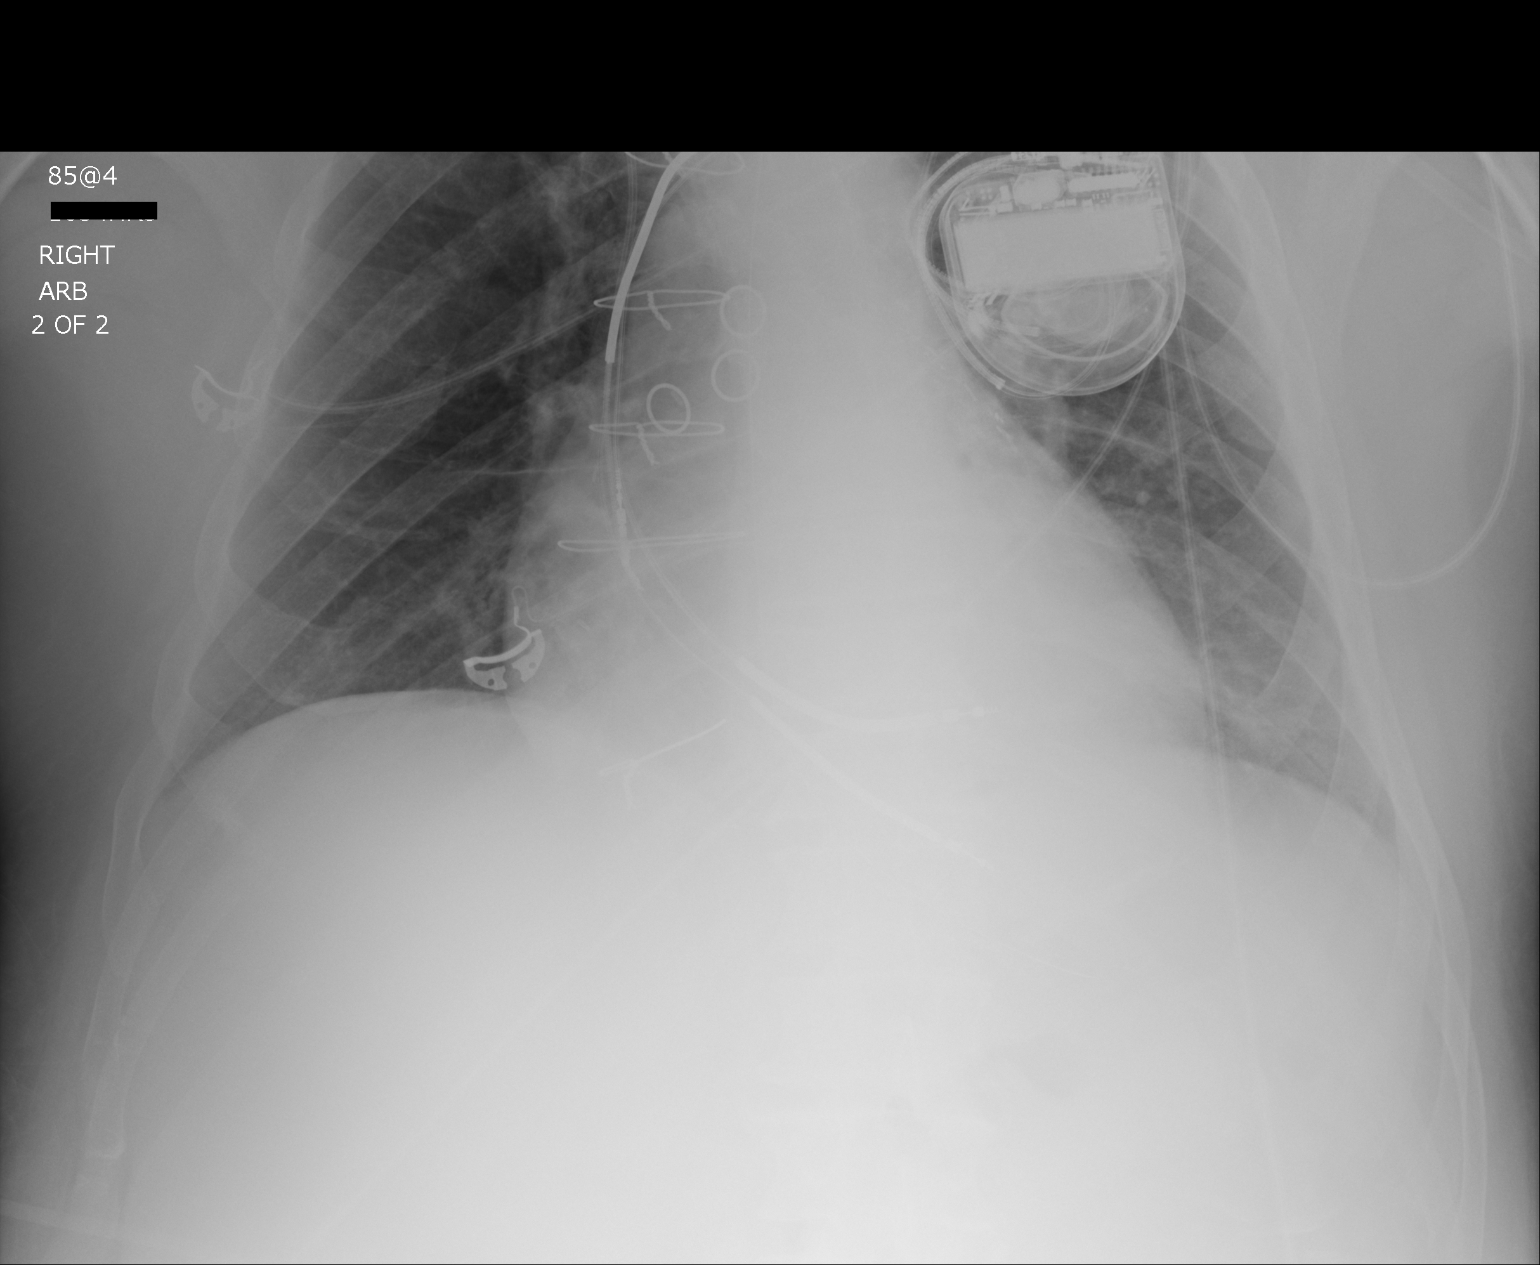

[2 of 2 positions shown; findings below may reference images not displayed]

FINDINGS: Endotracheal tube tip is about 4.7 cm above the carina. There is a
cardiac pacer present, unchanged. An NG tube is identified which
cannot be seen beyond the gastroesophageal junction.

Internal jugular central line has been placed with tip at at least
as far as the distal SVC. Cannot identify tip as it may be obscured
more distally bite EKG leads. No pneumothorax.

Status post previous CABG. Mild hazy density at both lung bases
suggesting atelectasis.
IMPRESSION: 1. NG tube cannot be confirmed to cross the gastroesophageal
junction. Recommended repeating the imaged centered more inferiorly
to identify NG tube tip.
2. Endotracheal tube tip 4.7 cm above the carina.
3. Cannot identify tip of internal jugular central line confidently.
It is seen at least as far as the distal superior vena cava.
Consider mildly oblique frontal view to further evaluate.

## 2016-01-09 NOTE — Progress Notes (Signed)
Daily Session Note  Patient Details  Name: William Bray MRN: [id number] Date of Birth: 1950/11/23 Referring Provider:   Flowsheet Row Cardiac Rehab from 01/02/2016 in [facility] Cardiac and Pulmonary Rehab  Referring Provider  Charlynn Grimes MD      Encounter Date: 01/09/2016  Check In:     Session Check In - 01/09/16 8416      Check-In   Location [facility]-Cardiac & Pulmonary Rehab   Staff Present [name], MA, ACSM RCEP, Exercise Physiologist;Kelly Amedeo Plenty, BS, ACSM CEP, Exercise Physiologist;Carroll Enterkin, RN, BSN   Supervising physician immediately available to respond to emergencies See telemetry face sheet for immediately available ER MD   Medication changes reported     No   Fall or balance concerns reported    No   Warm-up and Cool-down Performed on first and last piece of equipment   Resistance Training Performed Yes   VAD Patient? Yes     VAD patient   Has back up controller? Yes   Has spare charged batteries? Yes   Has battery cables? Yes   Has compatible battery clips? Yes     Pain Assessment   Currently in Pain? No/denies   Multiple Pain Sites No         Goals Met:  Independence with exercise equipment Exercise tolerated well No report of cardiac concerns or symptoms Strength training completed today  Goals Unmet:  Not Applicable  Comments: Pt able to follow exercise prescription today without complaint.  Will continue to monitor for progression.    Dr. Emily Filbert is Medical Director for [place] and [name].

## 2016-01-10 IMAGING — CR DG CHEST 1V PORT
1 series · 1 of 1 positions shown · non-contrast
Comparison: 05/01/2014

CLINICAL DATA: Left IJ hemodialysis catheter placement, acute on
chronic respiratory failure

EXAM:
PORTABLE CHEST - 1 VIEW

[ap]
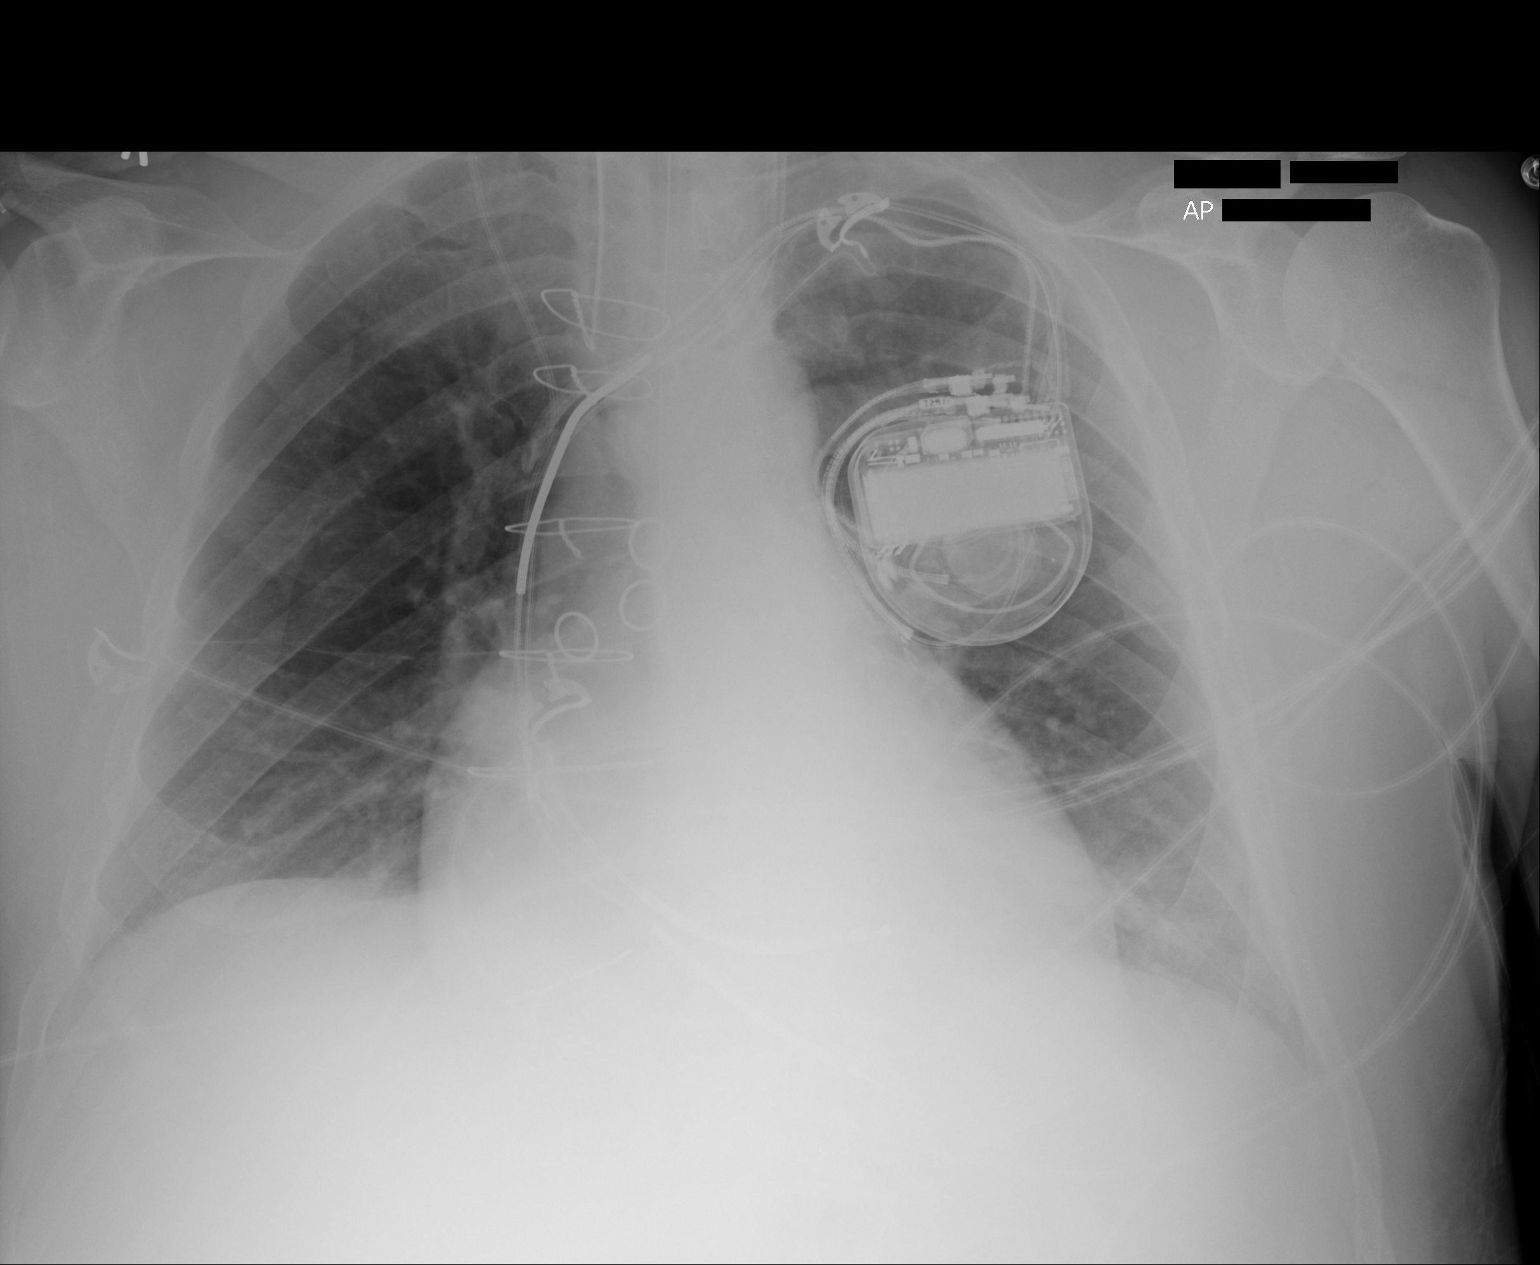

[1 of 1 positions shown; findings below may reference images not displayed]

FINDINGS: Left IJ approach hemodialysis catheter tip terminates over the
proximal SVC. No change otherwise. No pneumothorax.
IMPRESSION: Left IJ hemodialysis catheter tip over proximal SVC.

## 2016-01-11 ENCOUNTER — Encounter: Payer: Self-pay | Admitting: *Deleted

## 2016-01-11 DIAGNOSIS — Z95811 Presence of heart assist device: Secondary | ICD-10-CM

## 2016-01-11 DIAGNOSIS — I5022 Chronic systolic (congestive) heart failure: Secondary | ICD-10-CM

## 2016-01-11 DIAGNOSIS — Z955 Presence of coronary angioplasty implant and graft: Secondary | ICD-10-CM | POA: Diagnosis not present

## 2016-01-11 LAB — GLUCOSE, CAPILLARY
Glucose-Capillary: 167 mg/dL — ABNORMAL HIGH (ref 65–99)
Glucose-Capillary: 119 mg/dL — ABNORMAL HIGH (ref 65–99)

## 2016-01-11 NOTE — Progress Notes (Signed)
Cardiac Individual Treatment Plan  Patient Details  Name: William Bray MRN: [id number] Date of Birth: 05/22/1951 Referring Provider:   Flowsheet Row Cardiac Rehab from 01/02/2016 in 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  albuterol (PROVENTIL) (2.5 MG/3ML) 0.083% nebulizer solution, Inhale 3 mLs into the lungs every 6 (six) hours as needed., Disp: , Rfl:  .  allopurinol (ZYLOPRIM) 100 MG tablet, Take 100 mg by mouth daily., Disp: , Rfl:  .  ALPRAZolam (XANAX) 0.5 MG tablet, Take 0.5 mg by mouth 2 (two) times daily as needed. ONE IN THE MORNING AND ONE AT BEDTIME PRN, Disp: , Rfl:  .  aspirin EC 81 MG tablet, Take 81 mg by mouth daily., Disp: , Rfl:  .  budesonide (PULMICORT) 0.5 MG/2ML nebulizer solution, Inhale 2 mLs into the lungs 2 (two) times daily., Disp: , Rfl:  .  cetirizine (ZYRTEC) 10 MG tablet, Take 10 mg by mouth daily., Disp: , Rfl:  .  ferrous sulfate 324 (65 Fe) MG TBEC, Take 324 mg by mouth every morning. WITH BREAKFAST, Disp: , Rfl:  .  FLUoxetine (PROZAC) 20 MG capsule, Take 80 mg by mouth daily., Disp: , Rfl:  .  gabapentin (NEURONTIN) 100 MG capsule, Take 100 mg by mouth daily., Disp: , Rfl:  .  gabapentin (NEURONTIN) 300 MG capsule, Take 300 mg by mouth at bedtime., Disp: , Rfl:  .  lisinopril (PRINIVIL,ZESTRIL) 2.5 MG tablet, Take 2.5 mg by mouth daily., Disp: , Rfl:  .  metoprolol tartrate (LOPRESSOR) 25 MG tablet, Take 25 mg by mouth 2 (two) times daily., Disp: , Rfl:  .  mirtazapine (REMERON) 30 MG tablet, Take 0.5 tablets by mouth at bedtime., Disp: , Rfl:  .   montelukast (SINGULAIR) 10 MG tablet, Take 10 mg by mouth at bedtime., Disp: , Rfl:  .  ondansetron (ZOFRAN) 4 MG tablet, Take 4 mg by mouth every 8 (eight) hours as needed for nausea., Disp: , Rfl:  .  [START ON 01/13/2016] oxyCODONE (OXY IR/ROXICODONE) 5 MG immediate release tablet, Take 1 tablet by mouth 4 (four) times daily as needed., Disp: , Rfl:  .  pantoprazole (PROTONIX) 40 MG tablet, Take 40 mg by mouth daily., Disp: , Rfl:  .  pravastatin (PRAVACHOL) 40 MG tablet, Take 40 mg by mouth at bedtime., Disp: , Rfl:  .  spironolactone (ALDACTONE) 25 MG tablet, Take 25 mg by mouth daily., Disp: , Rfl:  .  torsemide (DEMADEX) 20 MG tablet, Take 2 tablets by mouth daily., Disp: , Rfl:  .  umeclidinium-vilanterol (ANORO ELLIPTA) 62.5-25 MCG/INH AEPB, Inhale 1 puff into the lungs daily., Disp: , Rfl:  .  warfarin (COUMADIN) 5 MG tablet, Take 5 mg by mouth as directed. Taking 5 mg M, W, F and 4 mg T, Th, Sat, Sun., Disp: , Rfl:   Past Medical History: Past Medical History:  Diagnosis Date  . Arthritis   . Chronic back pain   . Diabetes mellitus without complication   . Hypertension   . Neuropathy     Tobacco Use: History  Smoking Status  .  Never Smoker  Smokeless Tobacco  . Not on file    Labs: Recent Review Flowsheet Data    Labs for ITP Cardiac and Pulmonary Rehab Latest Ref Rng & Units 08/08/2012 04/28/2013 11/05/2013 05/30/2014 06/02/2014   Cholestrol 0 - 200 mg/dL 105 - 127 64 -   LDLCALC 0 - 100 mg/dL 37 - 74 27 -   HDL 40 - 60 mg/dL 33(L) - 33(L) 15(L) -   Trlycerides 0 - 200 mg/dL 173 - 99 108 -   Hemoglobin A1c 4.2 - 6.3 % - 5.9 - - 5.3       Exercise Target Goals:    Exercise Program Goal: Individual exercise prescription set with THRR, safety & activity barriers. Participant demonstrates ability to understand and report RPE using BORG scale, to self-measure pulse accurately, and to acknowledge the importance of the exercise prescription.  Exercise Prescription  Goal: Starting with aerobic activity 30 plus minutes a day, 3 days per week for initial exercise prescription. Provide home exercise prescription and guidelines that participant acknowledges understanding prior to discharge.  Activity Barriers & Risk Stratification:     Activity Barriers & Cardiac Risk Stratification - 01/02/16 1357      Activity Barriers & Cardiac Risk Stratification   Activity Barriers Arthritis;Back Problems;Shortness of Breath;Deconditioning;Muscular Weakness;Other (comment)   Comments William Bray has LVAD, Pacemaker, and Defibrillator.  William Bray is on mutilple meds that could affect his BP and make him dizzy.   Must use doppler to check BP.     Cardiac Risk Stratification High      6 Minute Walk:     6 Minute Walk    Row Name 01/02/16 1300         6 Minute Walk   Phase Initial     Distance 753 feet     Walk Time 5.31 minutes     # of Rest Breaks 3  For oxygen desaturation to 84%: 23 sec, 11 sec, 7 sec.  Pt made quick recovery each time     MPH 1.61     METS 2.25     RPE 13     Perceived Dyspnea  4     VO2 Peak 7.87     Symptoms Yes (comment)     Comments Shortness of Breath     Resting HR 86 bpm     Resting BP -  96 SBP     Max Ex. HR 119 bpm     Max Ex. BP -  102 SBP     2 Minute Post BP -  SBP 102, rck 86       Interval HR   Baseline HR 86     1 Minute HR 96     2 Minute HR 99     3 Minute HR 119     4 Minute HR 104     5 Minute HR 96     6 Minute HR 99     2 Minute Post HR 88     Interval Heart Rate? Yes       Interval Oxygen   Interval Oxygen? Yes     Baseline Oxygen Saturation % 92 %     Baseline Liters of Oxygen 0 L  Room Air     1 Minute Oxygen Saturation % 88 %     1 Minute Liters of Oxygen 0 L     2 Minute Oxygen Saturation % 87 %  at 1:51 84%     2 Minute  Liters of Oxygen 0 L     3 Minute Oxygen Saturation % 87 %  at 3:14 84%     3 Minute Liters of Oxygen 0 L     4 Minute Oxygen Saturation % 86 %  at 4 min 85%     4 Minute  Liters of Oxygen 0 L     5 Minute Oxygen Saturation % 92 %     5 Minute Liters of Oxygen 0 L     6 Minute Oxygen Saturation % 89 %     6 Minute Liters of Oxygen 0 L     2 Minute Post Oxygen Saturation % 97 %     2 Minute Post Liters of Oxygen 0 L        Initial Exercise Prescription:     Initial Exercise Prescription - 01/02/16 1400      Date of Initial Exercise RX and Referring Provider   Date 01/02/16   Referring Provider Charlynn Grimes MD     Oxygen   Oxygen Continuous   Liters 2     Treadmill   MPH 1.4   Grade 0   Minutes 15   METs 2.07     NuStep   Level 1   Minutes 15   METs 2     REL-XR   Level 1   Minutes 15   METs 2     Prescription Details   Frequency (times per week) 3   Duration Progress to 45 minutes of aerobic exercise without signs/symptoms of physical distress     Intensity   THRR 40-80% of Max Heartrate 114-141   Ratings of Perceived Exertion 11-15   Perceived Dyspnea 0-4     Progression   Progression Continue to progress workloads to maintain intensity without signs/symptoms of physical distress.     Resistance Training   Training Prescription Yes   Weight 2 lbs   Reps 10-12      Perform Capillary Blood Glucose checks as needed.  Exercise Prescription Changes:   Exercise Comments:     Exercise Comments    Row Name 01/02/16 1329 01/06/16 1011         Exercise Comments William Bray was to be more physcially healthy and more active by the time he finishes the program.  First full day of exercise!  Patient was oriented to gym and equipment including functions, settings, policies, and procedures.  Patient's individual exercise prescription and treatment plan were reviewed.  All starting workloads were established based on the results of the 6 minute walk test done at initial orientation visit.  The plan for exercise progression was also introduced and progression will be customized based on patient's performance and goals.          Discharge Exercise Prescription (Final Exercise Prescription Changes):   Nutrition:  Target Goals: Understanding of nutrition guidelines, daily intake of sodium <151m, cholesterol <2035m calories 30% from fat and 7% or less from saturated fats, daily to have 5 or more servings of fruits and vegetables.  Biometrics:     Pre Biometrics - 01/02/16 1449      Pre Biometrics   Height 5' 9.5" (1.765 m)   Weight 182 lb 4.8 oz (82.7 kg)   Waist Circumference 39 inches   Hip Circumference 40 inches   Waist to Hip Ratio 0.98 %   BMI (Calculated) 26.6       Nutrition Therapy Plan and Nutrition Goals:   Nutrition Discharge: Rate Your Plate Scores:  Nutrition Assessments - 01/09/16 1532      Rate Your Plate Scores   Pre Score 60   Pre Score % 66.6 %      Nutrition Goals Re-Evaluation:   Psychosocial: Target Goals: Acknowledge presence or absence of depression, maximize coping skills, provide positive support system. Participant is able to verbalize types and ability to use techniques and skills needed for reducing stress and depression.  Initial Review & Psychosocial Screening:     Initial Psych Review & Screening - 01/02/16 1412      Initial Review   Current issues with History of Depression;Current Psychotropic Meds     Family Dynamics   Good Support System? Yes   Comments William Bray has been married to his wife, Patterson Hollenbaugh, for 44 years.  He describes her as an Building services engineer.  They have one son who is a paramedic and one daughter one is an Therapist, sports.  No grandchildren. William Bray does not have a church affiliation, but states his wife does.       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  He has undergone counseling in the past for this reason.  He is currently on Prozac 80 mg once a day and Xanax 0.5 mg in the morning and 0.5 mg at night  as needed.        Screening Interventions   Interventions Encouraged to exercise;Program counselor consult      Quality of Life Scores:     Quality of Life - 01/02/16 1521      Quality of Life Scores   Health/Function Pre 19.6 %   Socioeconomic Pre 22.5 %   Psych/Spiritual Pre 22.93 %   Family Pre 27.8 %   GLOBAL Pre 22.1 %      PHQ-9: Recent Review Flowsheet Data    Depression screen [facility] 2/9 01/02/2016   Decreased Interest 0   Down, Depressed, Hopeless 1   PHQ - 2 Score 1   Altered sleeping 2   Tired, decreased energy 1   Change in appetite 0   Feeling bad or failure about yourself  1   Trouble concentrating 1   Moving slowly or fidgety/restless 1   Suicidal thoughts 0   PHQ-9 Score 7   Difficult doing work/chores Somewhat difficult      Psychosocial Evaluation and Intervention:     Psychosocial Evaluation - 01/09/16 0949      Psychosocial Evaluation & Interventions   Interventions Encouraged to exercise with the program and follow exercise prescription   Comments Counselor met with William Bray today for initial psychosocial evaluation.  He is a 65 year old who has had an LVAD for 2 years now.  He was recommended to come back to this program subsequent to a hospitalization and change of medications several months ago.  He has a strong support system with a spouse of 13 years and sisters and an adult daughter who live close by.  William Bray has multiple health issues with chronic lower back pain; diabetes; COPD; and neuropathy in his feet and hands.  He reports to sleeping fairly well with medications to help with this and his appetite has returned following this last hospitalization and change of medications.  William Bray has a history of depression and anxiety and reports his current medications are managing these symptoms fairly well at this time.  William Bray states he  is typically in a positive mood and other than his health, he has minimal stress in his life.  His  goals are to increase and strength and stamina while in this program.  Counselor will continue to folllow with him while in outpatient Cardiac Rehab.       Psychosocial Re-Evaluation:   Vocational Rehabilitation: Provide vocational rehab assistance to qualifying candidates.   Vocational Rehab Evaluation & Intervention:     Vocational Rehab - 01/02/16 1401      Initial Vocational Rehab Evaluation & Intervention   Assessment shows need for Vocational Rehabilitation No      Education: Education Goals: Education classes will be provided on a weekly basis, covering required topics. Participant will state understanding/return demonstration of topics presented.  Learning Barriers/Preferences:     Learning Barriers/Preferences - 01/02/16 1401      Learning Barriers/Preferences   Learning Preferences None      Education Topics: General Nutrition Guidelines/Fats and Fiber: -Group instruction provided by verbal, written material, models and posters to present the general guidelines for heart healthy nutrition. Gives an explanation and review of dietary fats and fiber.   Controlling Sodium/Reading Food Labels: -Group verbal and written material supporting the discussion of sodium use in heart healthy nutrition. Review and explanation with models, verbal and written materials for utilization of the food label.   Exercise Physiology & Risk Factors: - Group verbal and written instruction with models to review the exercise physiology of the cardiovascular system and associated critical values. Details cardiovascular disease risk factors and the goals associated with each risk factor.   Aerobic Exercise & Resistance Training: - Gives group verbal and written discussion on the health impact of inactivity. On the components of aerobic and resistive training programs and the benefits of this training and how to safely progress through these programs.   Flexibility, Balance, General  Exercise Guidelines: - Provides group verbal and written instruction on the benefits of flexibility and balance training programs. Provides general exercise guidelines with specific guidelines to those with heart or lung disease. Demonstration and skill practice provided. Flowsheet Row Cardiac Rehab from 01/09/2016 in [facility] Cardiac and Pulmonary Rehab  Date  01/09/16  Educator  [facility]  Instruction Review Code  2- meets goals/outcomes      Stress Management: - Provides group verbal and written instruction about the health risks of elevated stress, cause of high stress, and healthy ways to reduce stress.   Depression: - Provides group verbal and written instruction on the correlation between heart/lung disease and depressed mood, treatment options, and the stigmas associated with seeking treatment.   Anatomy & Physiology of the Heart: - Group verbal and written instruction and models provide basic cardiac anatomy and physiology, with the coronary electrical and arterial systems. Review of: AMI, Angina, Valve disease, Heart Failure, Cardiac Arrhythmia, Pacemakers, and the ICD.   Cardiac Procedures: - Group verbal and written instruction and models to describe the testing methods done to diagnose heart disease. Reviews the outcomes of the test results. Describes the treatment choices: Medical Management, Angioplasty, or Coronary Bypass Surgery.   Cardiac Medications: - Group verbal and written instruction to review commonly prescribed medications for heart disease. Reviews the medication, class of the drug, and side effects. Includes the steps to properly store meds and maintain the prescription regimen.   Go Sex-Intimacy & Heart Disease, Get SMART - Goal Setting: - Group verbal and written instruction through game format to discuss heart disease and the return to sexual intimacy. Provides group  verbal and written material to discuss and apply goal setting through the application of the  S.M.A.R.T. Method.   Other Matters of the Heart: - Provides group verbal, written materials and models to describe Heart Failure, Angina, Valve Disease, and Diabetes in the realm of heart disease. Includes description of the disease process and treatment options available to the cardiac patient.   Exercise & Equipment Safety: - Individual verbal instruction and demonstration of equipment use and safety with use of the equipment. Flowsheet Row Cardiac Rehab from 01/09/2016 in [facility] Cardiac and Pulmonary Rehab  Date  01/02/16  Educator  D. Joya Gaskins, RN  Instruction Review Code  1- partially meets, needs review/practice      Infection Prevention: - Provides verbal and written material to individual with discussion of infection control including proper hand washing and proper equipment cleaning during exercise session. Flowsheet Row Cardiac Rehab from 01/09/2016 in [facility] Cardiac and Pulmonary Rehab  Date  01/02/16  Educator  D. Joya Gaskins, RN  Instruction Review Code  2- meets goals/outcomes      Falls Prevention: - Provides verbal and written material to individual with discussion of falls prevention and safety. Flowsheet Row Cardiac Rehab from 01/09/2016 in [facility] Cardiac and Pulmonary Rehab  Date  01/02/16  Educator  D. Joya Gaskins, RN  Instruction Review Code  2- meets goals/outcomes      Diabetes: - Individual verbal and written instruction to review signs/symptoms of diabetes, desired ranges of glucose level fasting, after meals and with exercise. Advice that pre and post exercise glucose checks will be done for 3 sessions at entry of program. [place] from 01/09/2016 in [facility] Cardiac and Pulmonary Rehab  Date  01/02/16  Educator  D. Joya Gaskins, RN  Instruction Review Code  2- meets goals/outcomes       Knowledge Questionnaire Score:     Knowledge Questionnaire Score - 01/02/16 1424      Knowledge Questionnaire Score   Pre Score 20/28      Core Components/Risk  Factors/Patient Goals at Admission:     Personal Goals and Risk Factors at Admission - 01/02/16 1408      Core Components/Risk Factors/Patient Goals on Admission    Weight Management Weight Maintenance   Sedentary Yes   Intervention Provide advice, education, support and counseling about physical activity/exercise needs.;Develop an individualized exercise prescription for aerobic and resistive training based on initial evaluation findings, risk stratification, comorbidities and participant's personal goals.   Expected Outcomes Achievement of increased cardiorespiratory fitness and enhanced flexibility, muscular endurance and strength shown through measurements of functional capacity and personal statement of participant.   Increase Strength and Stamina Yes   Intervention Provide advice, education, support and counseling about physical activity/exercise needs.;Develop an individualized exercise prescription for aerobic and resistive training based on initial evaluation findings, risk stratification, comorbidities and participant's personal goals.   Expected Outcomes Achievement of increased cardiorespiratory fitness and enhanced flexibility, muscular endurance and strength shown through measurements of functional capacity and personal statement of participant.   Improve shortness of breath with ADL's Yes   Intervention Provide education, individualized exercise plan and daily activity instruction to help decrease symptoms of SOB with activities of daily living.   Expected Outcomes Short Term: Achieves a reduction of symptoms when performing activities of daily living.   Develop more efficient breathing techniques such as purse lipped breathing and diaphragmatic breathing; and practicing self-pacing with activity Yes   Intervention Provide education, demonstration and support about specific breathing techniuqes utilized for more efficient  breathing. Include techniques such as pursed lipped breathing,  diaphragmatic breathing and self-pacing activity.   Expected Outcomes Short Term: Participant will be able to demonstrate and use breathing techniques as needed throughout daily activities.   Diabetes Yes   Intervention Provide education about signs/symptoms and action to take for hypo/hyperglycemia.;Provide education about proper nutrition, including hydration, and aerobic/resistive exercise prescription along with prescribed medications to achieve blood glucose in normal ranges: Fasting glucose 65-99 mg/dL   Expected Outcomes Short Term: Participant verbalizes understanding of the signs/symptoms and immediate care of hyper/hypoglycemia, proper foot care and importance of medication, aerobic/resistive exercise and nutrition plan for blood glucose control.;Long Term: Attainment of HbA1C < 7%.   Heart Failure Yes   Intervention Provide a combined exercise and nutrition program that is supplemented with education, support and counseling about heart failure. Directed toward relieving symptoms such as shortness of breath, decreased exercise tolerance, and extremity edema.   Expected Outcomes Improve functional capacity of life;Short term: Attendance in program 2-3 days a week with increased exercise capacity. Reported lower sodium intake. Reported increased fruit and vegetable intake. Reports medication compliance.;Short term: Daily weights obtained and reported for increase. Utilizing diuretic protocols set by physician.;Long term: Adoption of self-care skills and reduction of barriers for early signs and symptoms recognition and intervention leading to self-care maintenance.   Lipids Yes   Intervention Provide education and support for participant on nutrition & aerobic/resistive exercise along with prescribed medications to achieve LDL <39m, HDL >4108m   Expected Outcomes Short Term: Participant states understanding of desired cholesterol values and is compliant with medications prescribed. Participant is  following exercise prescription and nutrition guidelines.;Long Term: Cholesterol controlled with medications as prescribed, with individualized exercise RX and with personalized nutrition plan. Value goals: LDL < 7061mHDL > 40 mg.   Stress Yes   Intervention Offer individual and/or small group education and counseling on adjustment to heart disease, stress management and health-related lifestyle change. Teach and support self-help strategies.;Refer participants experiencing significant psychosocial distress to appropriate mental health specialists for further evaluation and treatment. When possible, include family members and significant others in education/counseling sessions.   Expected Outcomes Short Term: Participant demonstrates changes in health-related behavior, relaxation and other stress management skills, ability to obtain effective social support, and compliance with psychotropic medications if prescribed.;Long Term: Emotional wellbeing is indicated by absence of clinically significant psychosocial distress or social isolation.      Core Components/Risk Factors/Patient Goals Review:      Goals and Risk Factor Review    Row Name 01/06/16 1007             Core Components/Risk Factors/Patient Goals Review   Personal Goals Review Weight Management/Obesity;Sedentary;Increase Strength and Stamina;Develop more efficient breathing techniques such as purse lipped breathing and diaphragmatic breathing and practicing self-pacing with activity.;Improve shortness of breath with ADL's;Heart Failure;Lipids;Diabetes;Hypertension       Review Reviewed goals with William Bray.       Expected Outcomes William Bray come to exercise and education classes to work towards his goals.          Core Components/Risk Factors/Patient Goals at Discharge (Final Review):      Goals and Risk Factor Review - 01/06/16 1007      Core Components/Risk Factors/Patient Goals Review   Personal Goals Review Weight  Management/Obesity;Sedentary;Increase Strength and Stamina;Develop more efficient breathing techniques such as purse lipped breathing and diaphragmatic breathing and practicing self-pacing with activity.;Improve shortness of breath with ADL's;Heart Failure;Lipids;Diabetes;Hypertension   Review Reviewed goals with  William Bray today.   Expected Outcomes Z[place] will come to exercise and education classes to work towards his goals.      ITP Comments:     ITP Comments    Row Name 01/02/16 1444 01/02/16 1501 01/11/16 0742       ITP Comments William Bray has LVAD present.  Being admitted to Cardiac Rehab for Chronic Systolic Heart Failure.  William Bray also has COPD and Sleep Apnea.  ECG today with atrial fibrillation, frequent PVCs, and occasional ventricular pacing.  Damyon's oxygen saturation dropped to 84% during 6 minute walk, but he recovered quickly.  William Bray states he has CPAP at home with1 liter of oxygen.  Occasionally he will use oxygen during the day for brief period if he is SOB from an activity.  SOB at intervals during 6 minute walk today.  No other cardiac symptoms reported or observed during orientation/med review.   William Bray has LVAD present.  Being admitted to Cardiac Rehab for Chronic Systolic Heart Failure.  William Bray also has COPD and Sleep Apnea.  ECG today with atrial fibrillation, frequent PVCs, and occasional ventricular pacing.  William Bray's oxygen saturation dropped to 84% during 6 minute walk, but he recovered quickly.  William Bray states he has CPAP at home with1 liter of oxygen.  Occasionally he will use oxygen during the day for brief period if he is SOB from an activity. Will reach out to Dr. Stann Mainland for an order or script about the possibility of titrating oxygen during cardiac rehab.   SOB at intervals during 6 minute walk today.  No other cardiac symptoms reported or observed during orientation/med review.   30 day review. Continue with ITP unless changes noted by Medical Director at signature of review.        Comments:

## 2016-01-11 NOTE — Progress Notes (Signed)
Daily Session Note  Patient Details  Name: William Bray MRN: [id number] Date of Birth: [birth year] Referring Provider:   Flowsheet Row Cardiac Rehab from 01/02/2016 in [facility] Cardiac and Pulmonary Rehab  Referring Provider  Charlynn Grimes MD      Encounter Date: 01/11/2016  Check In:     Session Check In - 01/11/16 0831      Check-In   Location [facility]-Cardiac & Pulmonary Rehab   Staff Present [name], MA, ACSM RCEP, Exercise Physiologist;Anallely Rosell Oletta Darter, BA, ACSM CEP, Exercise Physiologist;Carroll Enterkin, RN, BSN   Supervising physician immediately available to respond to emergencies See telemetry face sheet for immediately available ER MD   Medication changes reported     No   Fall or balance concerns reported    No   Resistance Training Performed Yes     VAD patient   Has back up controller? Yes   Has spare charged batteries? Yes   Has battery cables? Yes   Has compatible battery clips? Yes     Pain Assessment   Currently in Pain? No/denies   Multiple Pain Sites No         Goals Met:  Independence with exercise equipment Exercise tolerated well No report of cardiac concerns or symptoms Strength training completed today  Goals Unmet:  Not Applicable  Comments: Pt able to follow exercise prescription today without complaint.  Will continue to monitor for progression.    Dr. Emily Filbert is Medical Director for [facility] and [name].

## 2016-01-13 ENCOUNTER — Encounter: Payer: Medicare Other | Admitting: *Deleted

## 2016-01-13 DIAGNOSIS — I5022 Chronic systolic (congestive) heart failure: Secondary | ICD-10-CM

## 2016-01-13 DIAGNOSIS — Z955 Presence of coronary angioplasty implant and graft: Secondary | ICD-10-CM | POA: Diagnosis not present

## 2016-01-13 DIAGNOSIS — Z95811 Presence of heart assist device: Secondary | ICD-10-CM

## 2016-01-13 NOTE — Progress Notes (Signed)
Daily Session Note  Patient Details  Name: William Bray MRN: [id number] Date of Birth: 11/22/1950 Referring Provider:   Flowsheet Row Cardiac Rehab from 01/02/2016 in [facility] Cardiac and Pulmonary Rehab  Referring Provider  Charlynn Grimes MD      Encounter Date: 01/13/2016  Check In:     Session Check In - 01/13/16 0856      Check-In   Location [facility]-Cardiac & Pulmonary Rehab   Staff Present [name], MA, ACSM RCEP, Exercise Physiologist;Amanda Oletta Darter, BA, ACSM CEP, Exercise Physiologist;Carroll Enterkin, RN, BSN   Supervising physician immediately available to respond to emergencies See telemetry face sheet for immediately available ER MD   Medication changes reported     No   Fall or balance concerns reported    No   Warm-up and Cool-down Performed on first and last piece of equipment   Resistance Training Performed Yes   VAD Patient? Yes     VAD patient   Has back up controller? Yes   Has spare charged batteries? Yes   Has battery cables? Yes   Has compatible battery clips? Yes     Pain Assessment   Currently in Pain? No/denies   Multiple Pain Sites No         Goals Met:  Independence with exercise equipment Exercise tolerated well No report of cardiac concerns or symptoms Strength training completed today  Goals Unmet:  Not Applicable  Comments: Pt able to follow exercise prescription today without complaint.  Will continue to monitor for progression.    Dr. Emily Filbert is Medical Director for [place] and [name].

## 2016-01-16 ENCOUNTER — Encounter: Payer: Medicare Other | Admitting: *Deleted

## 2016-01-16 DIAGNOSIS — Z95811 Presence of heart assist device: Secondary | ICD-10-CM

## 2016-01-16 DIAGNOSIS — Z955 Presence of coronary angioplasty implant and graft: Secondary | ICD-10-CM | POA: Diagnosis not present

## 2016-01-16 DIAGNOSIS — I5022 Chronic systolic (congestive) heart failure: Secondary | ICD-10-CM

## 2016-01-16 NOTE — Progress Notes (Signed)
Daily Session Note  Patient Details  Name: William Bray MRN: [id number] Date of Birth: 1951/05/04 Referring Provider:   Flowsheet Row Cardiac Rehab from 01/02/2016 in [facility] Cardiac and Pulmonary Rehab  Referring Provider  Charlynn Grimes MD      Encounter Date: 01/16/2016  Check In:     Session Check In - 01/16/16 0753      Check-In   Location [facility]-Cardiac & Pulmonary Rehab   Staff Present Heath Lark, RN, BSN, Laveda Norman, BS, ACSM CEP, Exercise Physiologist;Jessica [place], [place], ACSM RCEP, Exercise Physiologist   Supervising physician immediately available to respond to emergencies See telemetry face sheet for immediately available ER MD   Medication changes reported     No   Fall or balance concerns reported    No   Warm-up and Cool-down Performed on first and last piece of equipment   Resistance Training Performed Yes   VAD Patient? Yes     VAD patient   Has back up controller? Yes   Has spare charged batteries? Yes   Has battery cables? Yes   Has compatible battery clips? Yes     Pain Assessment   Currently in Pain? No/denies   Multiple Pain Sites No         Goals Met:  Independence with exercise equipment Exercise tolerated well No report of cardiac concerns or symptoms Strength training completed today  Goals Unmet:  Not Applicable  Comments: Pt able to follow exercise prescription today without complaint.  Will continue to monitor for progression.    Dr. Emily Filbert is Medical Director for [place] and [name].

## 2016-01-18 ENCOUNTER — Encounter: Payer: Medicare Other | Admitting: *Deleted

## 2016-01-18 DIAGNOSIS — Z95811 Presence of heart assist device: Secondary | ICD-10-CM

## 2016-01-18 DIAGNOSIS — I5022 Chronic systolic (congestive) heart failure: Secondary | ICD-10-CM

## 2016-01-18 DIAGNOSIS — Z955 Presence of coronary angioplasty implant and graft: Secondary | ICD-10-CM | POA: Diagnosis not present

## 2016-01-18 NOTE — Progress Notes (Signed)
Daily Session Note  Patient Details  Name: William Bray MRN: [id number] Date of Birth: 1950/11/26 Referring Provider:   Flowsheet Row Cardiac Rehab from 01/02/2016 in [facility] Cardiac and Pulmonary Rehab  Referring Provider  Charlynn Grimes MD      Encounter Date: 01/18/2016  Check In:     Session Check In - 01/18/16 1052      Check-In   Location [facility]-Cardiac & Pulmonary Rehab   Staff Present Nyoka Cowden, RN, BSN, Kela Millin, BA, ACSM CEP, Exercise Physiologist;Faith Branan Luan [place], [place], ACSM RCEP, Exercise Physiologist   Supervising physician immediately available to respond to emergencies See telemetry face sheet for immediately available ER MD   Medication changes reported     No   Warm-up and Cool-down Performed on first and last piece of equipment   Resistance Training Performed Yes   VAD Patient? No     VAD patient   Has back up controller? Yes   Has spare charged batteries? Yes   Has battery cables? Yes   Has compatible battery clips? Yes     Pain Assessment   Currently in Pain? No/denies   Multiple Pain Sites No         Goals Met:  Independence with exercise equipment Improved SOB with ADL's Using PLB without cueing & demonstrates good technique Exercise tolerated well Personal goals reviewed No report of cardiac concerns or symptoms Strength training completed today  Goals Unmet:  Not Applicable  Comments: Pt able to follow exercise prescription today without complaint.  Will continue to monitor for progression.    Dr. Emily Filbert is Medical Director for [place] and [name].

## 2016-01-20 ENCOUNTER — Encounter: Payer: Medicare Other | Admitting: *Deleted

## 2016-01-20 DIAGNOSIS — Z955 Presence of coronary angioplasty implant and graft: Secondary | ICD-10-CM | POA: Diagnosis not present

## 2016-01-20 DIAGNOSIS — Z95811 Presence of heart assist device: Secondary | ICD-10-CM

## 2016-01-20 DIAGNOSIS — I5022 Chronic systolic (congestive) heart failure: Secondary | ICD-10-CM

## 2016-01-20 NOTE — Progress Notes (Signed)
Daily Session Note  Patient Details  Name: William Bray MRN: [id number] Date of Birth: [birth year] Referring Provider:   Flowsheet Row Cardiac Rehab from 01/02/2016 in [facility] Cardiac and Pulmonary Rehab  Referring Provider  Charlynn Grimes MD      Encounter Date: 01/20/2016  Check In:     Session Check In - 01/20/16 1003      Check-In   Location [facility]-Cardiac & Pulmonary Rehab   Staff Present Heath Lark, RN, BSN, CCRP;Hildur Bayer [place], MA, ACSM RCEP, Exercise Physiologist;Mary Kellie Shropshire, RN, BSN, MA   Supervising physician immediately available to respond to emergencies See telemetry face sheet for immediately available ER MD   Medication changes reported     No   Fall or balance concerns reported    No   Warm-up and Cool-down Performed on first and last piece of equipment   Resistance Training Performed Yes   VAD Patient? Yes     VAD patient   Has back up controller? Yes   Has spare charged batteries? Yes   Has battery cables? Yes   Has compatible battery clips? Yes     Pain Assessment   Currently in Pain? No/denies   Multiple Pain Sites No           Exercise Prescription Changes - 01/19/16 1100      Exercise Review   Progression Yes     Response to Exercise   Blood Pressure (Admit) --  98 Dopplar   Blood Pressure (Exercise) --  98 Dopplar   Blood Pressure (Exit) --  94 Dopplar   Heart Rate (Admit) 96 bpm   Heart Rate (Exercise) 116 bpm   Heart Rate (Exit) 89 bpm   Rating of Perceived Exertion (Exercise) 13   Symptoms none   Duration Progress to 45 minutes of aerobic exercise without signs/symptoms of physical distress   Intensity THRR unchanged     Progression   Progression Continue to progress workloads to maintain intensity without signs/symptoms of physical distress.   Average METs 2.67     Resistance Training   Training Prescription Yes   Weight 2 lbs   Reps 10-15     Interval Training   Interval Training No     Oxygen   Oxygen --  Has  not needed oxygen for exercise yet     Treadmill   MPH 2   Grade 0   Minutes 15   METs 2.53     NuStep   Level 3   Minutes 15   METs 2.5     REL-XR   Level 1   Minutes 15   METs 2.9      Goals Met:  Independence with exercise equipment Exercise tolerated well Personal goals reviewed No report of cardiac concerns or symptoms Strength training completed today  Goals Unmet:  Not Applicable  Comments: Pt able to follow exercise prescription today without complaint.  Will continue to monitor for progression.  [name] dropped out his arms on the XR to see if it would help with his back pain.  Reviewed home exercise with pt today.  Pt plans to walk at home for exercise.  Reviewed THR, pulse, RPE, sign and symptoms, and when to call 911 or MD.  Also discussed weather considerations and indoor options.  Pt voiced understanding.   [name], MA, ACSM RCEP 01/20/2016 10:05 AM     Dr. Emily Filbert is Medical Director for [place] and [name].

## 2016-01-23 ENCOUNTER — Encounter: Payer: Medicare Other | Admitting: *Deleted

## 2016-01-23 DIAGNOSIS — Z95811 Presence of heart assist device: Secondary | ICD-10-CM

## 2016-01-23 DIAGNOSIS — I5022 Chronic systolic (congestive) heart failure: Secondary | ICD-10-CM

## 2016-01-23 DIAGNOSIS — Z955 Presence of coronary angioplasty implant and graft: Secondary | ICD-10-CM | POA: Diagnosis not present

## 2016-01-23 NOTE — Progress Notes (Signed)
Daily Session Note  Patient Details  Name: William Bray MRN: [id number] Date of Birth: [birth year] Referring Provider:   Flowsheet Row Cardiac Rehab from 01/02/2016 in [facility] Cardiac and Pulmonary Rehab  Referring Provider  Charlynn Grimes MD      Encounter Date: 01/23/2016  Check In:     Session Check In - 01/23/16 0758      Check-In   Location [facility]-Cardiac & Pulmonary Rehab   Staff Present Heath Lark, RN, BSN, Laveda Norman, BS, ACSM CEP, Exercise Physiologist;Jessica [place], [place], ACSM RCEP, Exercise Physiologist   Supervising physician immediately available to respond to emergencies See telemetry face sheet for immediately available ER MD   Medication changes reported     No   Fall or balance concerns reported    No   Warm-up and Cool-down Performed on first and last piece of equipment   Resistance Training Performed Yes   VAD Patient? Yes     VAD patient   Has back up controller? Yes   Has spare charged batteries? Yes   Has battery cables? Yes   Has compatible battery clips? Yes     Pain Assessment   Currently in Pain? No/denies   Multiple Pain Sites No         Goals Met:  Independence with exercise equipment Exercise tolerated well No report of cardiac concerns or symptoms Strength training completed today  Goals Unmet:  Not Applicable  Comments: Pt able to follow exercise prescription today without complaint.  Will continue to monitor for progression.    Dr. Emily Filbert is Medical Director for [place] and [name].

## 2016-01-25 DIAGNOSIS — Z955 Presence of coronary angioplasty implant and graft: Secondary | ICD-10-CM | POA: Diagnosis not present

## 2016-01-25 DIAGNOSIS — Z95811 Presence of heart assist device: Secondary | ICD-10-CM

## 2016-01-25 DIAGNOSIS — I5022 Chronic systolic (congestive) heart failure: Secondary | ICD-10-CM

## 2016-01-25 NOTE — Progress Notes (Signed)
Daily Session Note  Patient Details  Name: William Bray MRN: [id number] Date of Birth: 08-25-1950 Referring Provider:   Flowsheet Row Cardiac Rehab from 01/02/2016 in [facility] Cardiac and Pulmonary Rehab  Referring Provider  Charlynn Grimes MD      Encounter Date: 01/25/2016  Check In:     Session Check In - 01/25/16 0754      Check-In   Location [facility]-Cardiac & Pulmonary Rehab   Staff Present [name], MA, ACSM RCEP, Exercise Physiologist;Kamaria Lucia Oletta Darter, BA, ACSM CEP, Exercise Physiologist;Mary Kellie Shropshire, RN, BSN, MA   Supervising physician immediately available to respond to emergencies See telemetry face sheet for immediately available ER MD   Medication changes reported     No   Fall or balance concerns reported    No   Warm-up and Cool-down Performed on first and last piece of equipment   Resistance Training Performed Yes   VAD Patient? Yes     VAD patient   Has back up controller? Yes   Has spare charged batteries? Yes   Has battery cables? Yes   Has compatible battery clips? Yes         Goals Met:  Independence with exercise equipment Exercise tolerated well No report of cardiac concerns or symptoms Strength training completed today  Goals Unmet:  Not Applicable  Comments: Pt able to follow exercise prescription today without complaint.  Will continue to monitor for progression.    Dr. Emily Filbert is Medical Director for [place] and [name].

## 2016-02-01 ENCOUNTER — Encounter: Payer: Medicare Other | Attending: Internal Medicine

## 2016-02-01 DIAGNOSIS — I5022 Chronic systolic (congestive) heart failure: Secondary | ICD-10-CM | POA: Diagnosis not present

## 2016-02-01 DIAGNOSIS — Z955 Presence of coronary angioplasty implant and graft: Secondary | ICD-10-CM | POA: Insufficient documentation

## 2016-02-01 DIAGNOSIS — Z9861 Coronary angioplasty status: Secondary | ICD-10-CM | POA: Insufficient documentation

## 2016-02-01 DIAGNOSIS — Z95811 Presence of heart assist device: Secondary | ICD-10-CM | POA: Diagnosis not present

## 2016-02-01 NOTE — Progress Notes (Signed)
Daily Session Note  Patient Details  Name: William Bray MRN: [id number] Date of Birth: 10/21/1950 Referring Provider:   Flowsheet Row Cardiac Rehab from 01/02/2016 in [facility] Cardiac and Pulmonary Rehab  Referring Provider  Charlynn Grimes MD      Encounter Date: 02/01/2016  Check In:     Session Check In - 02/01/16 0854      Check-In   Location [facility]-Cardiac & Pulmonary Rehab   Staff Present Heath Lark, RN, BSN, CCRP;Jessica Luan [place], MA, ACSM RCEP, Exercise Physiologist;Amanda Oletta Darter, BA, ACSM CEP, Exercise Physiologist   Supervising physician immediately available to respond to emergencies See telemetry face sheet for immediately available ER MD   Medication changes reported     No   Fall or balance concerns reported    No   Warm-up and Cool-down Performed on first and last piece of equipment   Resistance Training Performed Yes   VAD Patient? Yes     VAD patient   Has back up controller? Yes   Has spare charged batteries? Yes   Has battery cables? Yes   Has compatible battery clips? Yes         Goals Met:  Independence with exercise equipment Exercise tolerated well No report of cardiac concerns or symptoms Strength training completed today  Goals Unmet:  Not Applicable  Comments: Pt able to follow exercise prescription today without complaint.  Will continue to monitor for progression.    Dr. Emily Filbert is Medical Director for [place] and [name].

## 2016-02-03 ENCOUNTER — Encounter: Payer: Self-pay | Admitting: *Deleted

## 2016-02-03 ENCOUNTER — Telehealth: Payer: Self-pay | Admitting: *Deleted

## 2016-02-03 NOTE — Telephone Encounter (Signed)
William Bray called and said he is sorry that he can't attend Cardiac Rehab today since he has a nosebleed.

## 2016-02-06 ENCOUNTER — Encounter: Payer: Medicare Other | Admitting: *Deleted

## 2016-02-06 DIAGNOSIS — Z95811 Presence of heart assist device: Secondary | ICD-10-CM

## 2016-02-06 DIAGNOSIS — I5022 Chronic systolic (congestive) heart failure: Secondary | ICD-10-CM

## 2016-02-06 DIAGNOSIS — Z955 Presence of coronary angioplasty implant and graft: Secondary | ICD-10-CM | POA: Diagnosis not present

## 2016-02-06 NOTE — Progress Notes (Signed)
Daily Session Note  Patient Details  Name: ZAYDAN PAPESH MRN: [id number] Date of Birth: Oct 09, 1950 Referring Provider:   Flowsheet Row Cardiac Rehab from 01/02/2016 in [facility] Cardiac and Pulmonary Rehab  Referring Provider  Charlynn Grimes MD      Encounter Date: 02/06/2016  Check In:     Session Check In - 02/06/16 0744      Check-In   Location [facility]-Cardiac & Pulmonary Rehab   Staff Present Earlean Shawl, BS, ACSM CEP, Exercise Physiologist;Jessica Luan [place], MA, ACSM RCEP, Exercise Physiologist;Carroll Enterkin, RN, BSN   Supervising physician immediately available to respond to emergencies See telemetry face sheet for immediately available ER MD   Medication changes reported     No   Fall or balance concerns reported    No   Warm-up and Cool-down Performed on first and last piece of equipment   Resistance Training Performed Yes   VAD Patient? Yes     VAD patient   Has back up controller? Yes   Has spare charged batteries? Yes   Has battery cables? Yes   Has compatible battery clips? Yes     Pain Assessment   Currently in Pain? No/denies   Multiple Pain Sites No         Goals Met:  Independence with exercise equipment Exercise tolerated well No report of cardiac concerns or symptoms Strength training completed today  Goals Unmet:  Not Applicable  Comments: Pt able to follow exercise prescription today without complaint.  Will continue to monitor for progression.    Dr. Emily Filbert is Medical Director for [place] and [name].

## 2016-02-08 ENCOUNTER — Encounter: Payer: Self-pay | Admitting: *Deleted

## 2016-02-08 ENCOUNTER — Encounter: Payer: Medicare Other | Admitting: *Deleted

## 2016-02-08 DIAGNOSIS — Z955 Presence of coronary angioplasty implant and graft: Secondary | ICD-10-CM | POA: Diagnosis not present

## 2016-02-08 DIAGNOSIS — Z95811 Presence of heart assist device: Secondary | ICD-10-CM

## 2016-02-08 DIAGNOSIS — I5022 Chronic systolic (congestive) heart failure: Secondary | ICD-10-CM

## 2016-02-08 NOTE — Progress Notes (Signed)
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  albuterol (PROVENTIL) (2.5 MG/3ML) 0.083% nebulizer solution, Inhale 3 mLs into the lungs every 6 (six) hours as needed., Disp: , Rfl:  .  allopurinol (ZYLOPRIM) 100 MG tablet, Take 100 mg by mouth daily., Disp: , Rfl:  .  ALPRAZolam (XANAX) 0.5 MG tablet, Take 0.5 mg by mouth 2 (two) times daily as needed. ONE IN THE MORNING AND ONE AT BEDTIME PRN, Disp: , Rfl:  .  aspirin EC 81 MG tablet, Take 81 mg by mouth daily., Disp: , Rfl:  .  budesonide (PULMICORT) 0.5 MG/2ML nebulizer solution, Inhale 2 mLs into the lungs 2 (two) times daily., Disp: , Rfl:  .  cetirizine (ZYRTEC) 10 MG tablet, Take 10 mg by mouth daily., Disp: , Rfl:  .  ferrous sulfate 324 (65 Fe) MG TBEC, Take 324 mg by mouth every morning. WITH BREAKFAST, Disp: , Rfl:  .  FLUoxetine (PROZAC) 20 MG capsule, Take 80 mg by mouth daily., Disp: , Rfl:  .  gabapentin (NEURONTIN) 100 MG capsule, Take 100 mg by mouth daily., Disp: , Rfl:  .  gabapentin (NEURONTIN) 300 MG capsule, Take 300 mg by mouth at bedtime., Disp: , Rfl:  .  lisinopril (PRINIVIL,ZESTRIL) 2.5 MG tablet, Take 2.5 mg by mouth daily., Disp: , Rfl:  .  metoprolol tartrate (LOPRESSOR) 25 MG tablet, Take 25 mg by mouth 2 (two) times daily., Disp: , Rfl:  .  mirtazapine (REMERON) 30 MG tablet, Take 0.5 tablets by mouth at bedtime., Disp: , Rfl:  .   montelukast (SINGULAIR) 10 MG tablet, Take 10 mg by mouth at bedtime., Disp: , Rfl:  .  ondansetron (ZOFRAN) 4 MG tablet, Take 4 mg by mouth every 8 (eight) hours as needed for nausea., Disp: , Rfl:  .  oxyCODONE (OXY IR/ROXICODONE) 5 MG immediate release tablet, Take 1 tablet by mouth 4 (four) times daily as needed., Disp: , Rfl:  .  pantoprazole (PROTONIX) 40 MG tablet, Take 40 mg by mouth daily., Disp: , Rfl:  .  pravastatin (PRAVACHOL) 40 MG tablet, Take 40 mg by mouth at bedtime., Disp: , Rfl:  .  spironolactone (ALDACTONE) 25 MG tablet, Take 25 mg by mouth daily., Disp: , Rfl:  .  torsemide (DEMADEX) 20 MG tablet, Take 2 tablets by mouth daily., Disp: , Rfl:  .  umeclidinium-vilanterol (ANORO ELLIPTA) 62.5-25 MCG/INH AEPB, Inhale 1 puff into the lungs daily., Disp: , Rfl:  .  warfarin (COUMADIN) 5 MG tablet, Take 5 mg by mouth as directed. Taking 5 mg M, W, F and 4 mg T, Th, Sat, Sun., Disp: , Rfl:   Past Medical History: Past Medical History:  Diagnosis Date  . Arthritis   . Chronic back pain   . Diabetes mellitus without complication   . Hypertension   . Neuropathy     Tobacco Use: History  Smoking Status  . Never Smoker  Smokeless Tobacco  . Not on file    Labs: Recent Review Flowsheet Data    Labs for ITP Cardiac and Pulmonary Rehab Latest Ref Rng & Units 08/08/2012 04/28/2013 11/05/2013 05/30/2014 06/02/2014   Cholestrol 0 - 200 mg/dL 105 - 127 64 -   LDLCALC 0 - 100 mg/dL 37 - 74 27 -   HDL 40 - 60 mg/dL 33(L) - 33(L) 15(L) -   Trlycerides 0 - 200 mg/dL 173 - 99 108 -   Hemoglobin A1c 4.2 - 6.3 % - 5.9 - - 5.3       Exercise Target Goals:    Exercise Program Goal: Individual exercise prescription set with THRR, safety & activity barriers. Participant demonstrates ability to understand and report RPE using BORG scale, to self-measure pulse accurately, and to acknowledge the importance of the exercise prescription.  Exercise Prescription Goal: Starting with aerobic  activity 30 plus minutes a day, 3 days per week for initial exercise prescription. Provide home exercise prescription and guidelines that participant acknowledges understanding prior to discharge.  Activity Barriers & Risk Stratification:     Activity Barriers & Cardiac Risk Stratification - 01/02/16 1357      Activity Barriers & Cardiac Risk Stratification   Activity Barriers Arthritis;Back Problems;Shortness of Breath;Deconditioning;Muscular Weakness;Other (comment)   Comments Depaul has LVAD, Pacemaker, and Defibrillator.  Jamoni is on mutilple meds that could affect his BP and make him dizzy.   Must use doppler to check BP.     Cardiac Risk Stratification High      6 Minute Walk:     6 Minute Walk    Row Name 01/02/16 1300         6 Minute Walk   Phase Initial     Distance 753 feet     Walk Time 5.31 minutes     # of Rest Breaks 3  For oxygen desaturation to 84%: 23 sec, 11 sec, 7 sec.  Pt made quick recovery each time     MPH 1.61     METS 2.25     RPE 13     Perceived Dyspnea  4     VO2 Peak 7.87     Symptoms Yes (comment)     Comments Shortness of Breath     Resting HR 86 bpm     Resting BP -  96 SBP     Max Ex. HR 119 bpm     Max Ex. BP -  102 SBP     2 Minute Post BP -  SBP 102, rck 86       Interval HR   Baseline HR 86     1 Minute HR 96     2 Minute HR 99     3 Minute HR 119     4 Minute HR 104     5 Minute HR 96     6 Minute HR 99     2 Minute Post HR 88     Interval Heart Rate? Yes       Interval Oxygen   Interval Oxygen? Yes     Baseline Oxygen Saturation % 92 %     Baseline Liters of Oxygen 0 L  Room Air     1 Minute Oxygen Saturation % 88 %     1 Minute Liters of Oxygen 0 L     2 Minute Oxygen Saturation % 87 %  at 1:51 84%     2 Minute Liters of Oxygen  0 L     3 Minute Oxygen Saturation % 87 %  at 3:14 84%     3 Minute Liters of Oxygen 0 L     4 Minute Oxygen Saturation % 86 %  at 4 min 85%     4 Minute Liters of Oxygen 0 L     5  Minute Oxygen Saturation % 92 %     5 Minute Liters of Oxygen 0 L     6 Minute Oxygen Saturation % 89 %     6 Minute Liters of Oxygen 0 L     2 Minute Post Oxygen Saturation % 97 %     2 Minute Post Liters of Oxygen 0 L        Initial Exercise Prescription:     Initial Exercise Prescription - 01/02/16 1400      Date of Initial Exercise RX and Referring Provider   Date 01/02/16   Referring Provider Charlynn Grimes MD     Oxygen   Oxygen Continuous   Liters 2     Treadmill   MPH 1.4   Grade 0   Minutes 15   METs 2.07     NuStep   Level 1   Minutes 15   METs 2     REL-XR   Level 1   Minutes 15   METs 2     Prescription Details   Frequency (times per week) 3   Duration Progress to 45 minutes of aerobic exercise without signs/symptoms of physical distress     Intensity   THRR 40-80% of Max Heartrate 114-141   Ratings of Perceived Exertion 11-15   Perceived Dyspnea 0-4     Progression   Progression Continue to progress workloads to maintain intensity without signs/symptoms of physical distress.     Resistance Training   Training Prescription Yes   Weight 2 lbs   Reps 10-12      Perform Capillary Blood Glucose checks as needed.  Exercise Prescription Changes:     Exercise Prescription Changes    Row Name 01/19/16 1100 01/20/16 1000 02/01/16 1500         Exercise Review   Progression Yes Yes Yes       Response to Exercise   Blood Pressure (Admit) -  98 Dopplar  - -  100 dopplar     Blood Pressure (Exercise) -  98 Dopplar  - -  106 dopplar     Blood Pressure (Exit) -  94 Dopplar  - -  96 dopplar     Heart Rate (Admit) 96 bpm  - 91 bpm     Heart Rate (Exercise) 116 bpm  - 120 bpm     Heart Rate (Exit) 89 bpm  - 92 bpm     Rating of Perceived Exertion (Exercise) 13  - 13     Symptoms none none none     Comments  - Home Exercise Guidelines given 01/20/16 Home Exercise Guidelines given 01/20/16     Duration Progress to 45 minutes of aerobic  exercise without signs/symptoms of physical distress Progress to 45 minutes of aerobic exercise without signs/symptoms of physical distress Progress to 45 minutes of aerobic exercise without signs/symptoms of physical distress     Intensity THRR unchanged THRR unchanged THRR unchanged       Progression   Progression Continue to progress workloads to maintain intensity without signs/symptoms of physical distress. Continue to progress workloads to maintain intensity without signs/symptoms of physical  distress. Continue to progress workloads to maintain intensity without signs/symptoms of physical distress.     Average METs 2.67  - 2.35       Resistance Training   Training Prescription Yes Yes Yes     Weight 2 lbs 2 lbs 4 lbs     Reps 10-15 10-15 10-15       Interval Training   Interval Training No No No       Oxygen   Oxygen -  Has not needed oxygen for exercise yet -  Has not needed oxygen for exercise yet  -       Treadmill   MPH 2 2  -     Grade 0 0  -     Minutes 15 15  -     METs 2.53 2.53  -       NuStep   Level 3 3 3      Minutes 15 15 15      METs 2.5 2.5 2.5       REL-XR   Level 1 1 4      Minutes 15 15 15      METs 2.9 2.9 2.2       Home Exercise Plan   Plans to continue exercise at  - Home  walking Home  walking     Frequency  - Add 2 additional days to program exercise sessions. Add 2 additional days to program exercise sessions.        Exercise Comments:     Exercise Comments    Row Name 01/02/16 1329 01/06/16 1011 01/19/16 1128 01/20/16 1004 01/20/16 1006   Exercise Comments Kyle was to be more physcially healthy and more active by the time he finishes the program.  First full day of exercise!  Patient was oriented to gym and equipment including functions, settings, policies, and procedures.  Patient's individual exercise prescription and treatment plan were reviewed.  All starting workloads were established based on the results of the 6 minute walk test done  at initial orientation visit.  The plan for exercise progression was also introduced and progression will be customized based on patient's performance and goals. Emin is off to a good start with exercise.  He is able to exercise the full 45 min already!  We will continue to monitor for progression. Reviewed METs average and discussed progression with pt today. Selden dropped out his arms on the XR to see if it would help with his back pain.  Reviewed home exercise with pt today.  Pt plans to walk at home for exercise.  Reviewed THR, pulse, RPE, sign and symptoms, and when to call 911 or MD.  Also discussed weather considerations and indoor options.  Pt voiced understanding.   Row Name 02/01/16 1534 02/06/16 0842         Exercise Comments Roran is doing well with exercise.  He is feeling stronger and has more stamina.  He has been walking at home on his off days.  We will continue to monitor for progression. Reviewed METs average and discussed progression with pt today.         Discharge Exercise Prescription (Final Exercise Prescription Changes):     Exercise Prescription Changes - 02/01/16 1500      Exercise Review   Progression Yes     Response to Exercise   Blood Pressure (Admit) --  100 dopplar   Blood Pressure (Exercise) --  106 dopplar   Blood Pressure (Exit) --  96 dopplar  Heart Rate (Admit) 91 bpm   Heart Rate (Exercise) 120 bpm   Heart Rate (Exit) 92 bpm   Rating of Perceived Exertion (Exercise) 13   Symptoms none   Comments Home Exercise Guidelines given 01/20/16   Duration Progress to 45 minutes of aerobic exercise without signs/symptoms of physical distress   Intensity THRR unchanged     Progression   Progression Continue to progress workloads to maintain intensity without signs/symptoms of physical distress.   Average METs 2.35     Resistance Training   Training Prescription Yes   Weight 4 lbs   Reps 10-15     Interval Training   Interval Training No     NuStep    Level 3   Minutes 15   METs 2.5     REL-XR   Level 4   Minutes 15   METs 2.2     Home Exercise Plan   Plans to continue exercise at Home  walking   Frequency Add 2 additional days to program exercise sessions.      Nutrition:  Target Goals: Understanding of nutrition guidelines, daily intake of sodium <1535m, cholesterol <2060m calories 30% from fat and 7% or less from saturated fats, daily to have 5 or more servings of fruits and vegetables.  Biometrics:     Pre Biometrics - 01/02/16 1449      Pre Biometrics   Height 5' 9.5" (1.765 m)   Weight 182 lb 4.8 oz (82.7 kg)   Waist Circumference 39 inches   Hip Circumference 40 inches   Waist to Hip Ratio 0.98 %   BMI (Calculated) 26.6       Nutrition Therapy Plan and Nutrition Goals:   Nutrition Discharge: Rate Your Plate Scores:     Nutrition Assessments - 01/09/16 1532      Rate Your Plate Scores   Pre Score 60   Pre Score % 66.6 %      Nutrition Goals Re-Evaluation:   Psychosocial: Target Goals: Acknowledge presence or absence of depression, maximize coping skills, provide positive support system. Participant is able to verbalize types and ability to use techniques and skills needed for reducing stress and depression.  Initial Review & Psychosocial Screening:     Initial Psych Review & Screening - 01/02/16 1412      Initial Review   Current issues with History of Depression;Current Psychotropic Meds     Family Dynamics   Good Support System? Yes   Comments PaTylenas been married to his wife, DeJodi Kappesfor 44 years.  He describes her as an anBuilding services engineer They have one son who is a paramedic and one daughter one is an RNTherapist, sports No grandchildren. PaArieonoes not have a church affiliation, but states his wife does.       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  He  has undergone counseling in the past for this reason.  He is currently on Prozac 80 mg once a day and Xanax 0.5 mg in the morning and 0.5 mg at night as needed.        Screening Interventions   Interventions Encouraged to exercise;Program counselor consult      Quality of Life Scores:     Quality of Life - 01/02/16 1521      Quality of Life Scores   Health/Function Pre 19.6 %  Socioeconomic Pre 22.5 %   Psych/Spiritual Pre 22.93 %   Family Pre 27.8 %   GLOBAL Pre 22.1 %      PHQ-9: Recent Review Flowsheet Data    Depression screen [facility] 2/9 01/02/2016   Decreased Interest 0   Down, Depressed, Hopeless 1   PHQ - 2 Score 1   Altered sleeping 2   Tired, decreased energy 1   Change in appetite 0   Feeling bad or failure about yourself  1   Trouble concentrating 1   Moving slowly or fidgety/restless 1   Suicidal thoughts 0   PHQ-9 Score 7   Difficult doing work/chores Somewhat difficult      Psychosocial Evaluation and Intervention:     Psychosocial Evaluation - 02/01/16 0951      Psychosocial Evaluation & Interventions   Comments Follow up with Mr. Bennison reporting he is feeling so much stronger since coming into this program.  He struggled walking from the car to the class and had to stop up to (3) times to catch his breath initially and now he reports walking in without stopping at all.  He states his wife has noticed his renewed energy and stamina and has made positive remarks to him about it.  He reports his mood remains positive and he is continuing to take medication to help with his sleep which continues to be 5-6 hours per night.  Counselor commended Mr. Weatherbee on his commitment to exercise consistently and his progress made as a result.  Counselor will continue to follow with Mr. B throught the course of this program.        Psychosocial Re-Evaluation:     Psychosocial Re-Evaluation    [place] Name 02/03/16 [id number]             Psychosocial Re-Evaluation    Comments Samarion called and said he is sorry that he can't attend Cardiac Rehab today since he has a nosebleed.          Vocational Rehabilitation: Provide vocational rehab assistance to qualifying candidates.   Vocational Rehab Evaluation & Intervention:     Vocational Rehab - 01/02/16 1401      Initial Vocational Rehab Evaluation & Intervention   Assessment shows need for Vocational Rehabilitation No      Education: Education Goals: Education classes will be provided on a weekly basis, covering required topics. Participant will state understanding/return demonstration of topics presented.  Learning Barriers/Preferences:     Learning Barriers/Preferences - 01/02/16 1401      Learning Barriers/Preferences   Learning Preferences None      Education Topics: General Nutrition Guidelines/Fats and Fiber: -Group instruction provided by verbal, written material, models and posters to present the general guidelines for heart healthy nutrition. Gives an explanation and review of dietary fats and fiber.   Controlling Sodium/Reading Food Labels: -Group verbal and written material supporting the discussion of sodium use in heart healthy nutrition. Review and explanation with models, verbal and written materials for utilization of the food label.   Exercise Physiology & Risk Factors: - Group verbal and written instruction with models to review the exercise physiology of the cardiovascular system and associated critical values. Details cardiovascular disease risk factors and the goals associated with each risk factor.   Aerobic Exercise & Resistance Training: - Gives group verbal and written discussion on the health impact of inactivity. On the components of aerobic and resistive training programs and the benefits of this training and how to safely progress through these  programs.   Flexibility, Balance, General Exercise Guidelines: - Provides group verbal and written instruction on  the benefits of flexibility and balance training programs. Provides general exercise guidelines with specific guidelines to those with heart or lung disease. Demonstration and skill practice provided. Flowsheet Row Cardiac Rehab from 02/06/2016 in [facility] Cardiac and Pulmonary Rehab  Date  01/09/16  Educator  [facility]  Instruction Review Code  2- meets goals/outcomes      Stress Management: - Provides group verbal and written instruction about the health risks of elevated stress, cause of high stress, and healthy ways to reduce stress. Flowsheet Row Cardiac Rehab from 02/06/2016 in [facility] Cardiac and Pulmonary Rehab  Date  01/11/16  Educator  [facility]  Instruction Review Code  2- meets goals/outcomes      Depression: - Provides group verbal and written instruction on the correlation between heart/lung disease and depressed mood, treatment options, and the stigmas associated with seeking treatment.   Anatomy & Physiology of the Heart: - Group verbal and written instruction and models provide basic cardiac anatomy and physiology, with the coronary electrical and arterial systems. Review of: AMI, Angina, Valve disease, Heart Failure, Cardiac Arrhythmia, Pacemakers, and the ICD. Flowsheet Row Cardiac Rehab from 02/06/2016 in [facility] Cardiac and Pulmonary Rehab  Date  01/16/16  Educator  SB  Instruction Review Code  2- meets goals/outcomes      Cardiac Procedures: - Group verbal and written instruction and models to describe the testing methods done to diagnose heart disease. Reviews the outcomes of the test results. Describes the treatment choices: Medical Management, Angioplasty, or Coronary Bypass Surgery. Flowsheet Row Cardiac Rehab from 02/06/2016 in [facility] Cardiac and Pulmonary Rehab  Date  01/23/16  Educator  SB  Instruction Review Code  2- meets goals/outcomes      Cardiac Medications: - Group verbal and written instruction to review commonly prescribed medications for heart disease. Reviews the  medication, class of the drug, and side effects. Includes the steps to properly store meds and maintain the prescription regimen. Flowsheet Row Cardiac Rehab from 02/06/2016 in [facility] Cardiac and Pulmonary Rehab  Date  02/01/16 Marisue Humble 2]  Educator  SB  Instruction Review Code  2- meets goals/outcomes      Go Sex-Intimacy & Heart Disease, Get SMART - Goal Setting: - Group verbal and written instruction through game format to discuss heart disease and the return to sexual intimacy. Provides group verbal and written material to discuss and apply goal setting through the application of the S.M.A.R.T. Method. Flowsheet Row Cardiac Rehab from 02/06/2016 in [facility] Cardiac and Pulmonary Rehab  Date  01/23/16  Educator  SB  Instruction Review Code  2- meets goals/outcomes      Other Matters of the Heart: - Provides group verbal, written materials and models to describe Heart Failure, Angina, Valve Disease, and Diabetes in the realm of heart disease. Includes description of the disease process and treatment options available to the cardiac patient. Flowsheet Row Cardiac Rehab from 02/06/2016 in [facility] Cardiac and Pulmonary Rehab  Date  01/16/16  Educator  SB  Instruction Review Code  2- meets goals/outcomes      Exercise & Equipment Safety: - Individual verbal instruction and demonstration of equipment use and safety with use of the equipment. Flowsheet Row Cardiac Rehab from 02/06/2016 in [facility] Cardiac and Pulmonary Rehab  Date  01/02/16  Educator  D. Joya Gaskins, RN  Instruction Review Code  1- partially meets, needs review/practice      Infection Prevention: - Provides  verbal and written material to individual with discussion of infection control including proper hand washing and proper equipment cleaning during exercise session. Flowsheet Row Cardiac Rehab from 02/06/2016 in [facility] Cardiac and Pulmonary Rehab  Date  01/02/16  Educator  D. Joya Gaskins, RN  Instruction Review Code  2- meets goals/outcomes       Falls Prevention: - Provides verbal and written material to individual with discussion of falls prevention and safety. Flowsheet Row Cardiac Rehab from 02/06/2016 in [facility] Cardiac and Pulmonary Rehab  Date  01/02/16  Educator  D. Joya Gaskins, RN  Instruction Review Code  2- meets goals/outcomes      Diabetes: - Individual verbal and written instruction to review signs/symptoms of diabetes, desired ranges of glucose level fasting, after meals and with exercise. Advice that pre and post exercise glucose checks will be done for 3 sessions at entry of program. [place] from 02/06/2016 in [facility] Cardiac and Pulmonary Rehab  Date  01/02/16  Educator  D. Joya Gaskins, RN  Instruction Review Code  2- meets goals/outcomes       Knowledge Questionnaire Score:     Knowledge Questionnaire Score - 01/02/16 1424      Knowledge Questionnaire Score   Pre Score 20/28      Core Components/Risk Factors/Patient Goals at Admission:     Personal Goals and Risk Factors at Admission - 01/02/16 1408      Core Components/Risk Factors/Patient Goals on Admission    Weight Management Weight Maintenance   Sedentary Yes   Intervention Provide advice, education, support and counseling about physical activity/exercise needs.;Develop an individualized exercise prescription for aerobic and resistive training based on initial evaluation findings, risk stratification, comorbidities and participant's personal goals.   Expected Outcomes Achievement of increased cardiorespiratory fitness and enhanced flexibility, muscular endurance and strength shown through measurements of functional capacity and personal statement of participant.   Increase Strength and Stamina Yes   Intervention Provide advice, education, support and counseling about physical activity/exercise needs.;Develop an individualized exercise prescription for aerobic and resistive training based on initial evaluation findings, risk  stratification, comorbidities and participant's personal goals.   Expected Outcomes Achievement of increased cardiorespiratory fitness and enhanced flexibility, muscular endurance and strength shown through measurements of functional capacity and personal statement of participant.   Improve shortness of breath with ADL's Yes   Intervention Provide education, individualized exercise plan and daily activity instruction to help decrease symptoms of SOB with activities of daily living.   Expected Outcomes Short Term: Achieves a reduction of symptoms when performing activities of daily living.   Develop more efficient breathing techniques such as purse lipped breathing and diaphragmatic breathing; and practicing self-pacing with activity Yes   Intervention Provide education, demonstration and support about specific breathing techniuqes utilized for more efficient breathing. Include techniques such as pursed lipped breathing, diaphragmatic breathing and self-pacing activity.   Expected Outcomes Short Term: Participant will be able to demonstrate and use breathing techniques as needed throughout daily activities.   Diabetes Yes   Intervention Provide education about signs/symptoms and action to take for hypo/hyperglycemia.;Provide education about proper nutrition, including hydration, and aerobic/resistive exercise prescription along with prescribed medications to achieve blood glucose in normal ranges: Fasting glucose 65-99 mg/dL   Expected Outcomes Short Term: Participant verbalizes understanding of the signs/symptoms and immediate care of hyper/hypoglycemia, proper foot care and importance of medication, aerobic/resistive exercise and nutrition plan for blood glucose control.;Long Term: Attainment of HbA1C < 7%.   Heart Failure Yes   Intervention Provide  a combined exercise and nutrition program that is supplemented with education, support and counseling about heart failure. Directed toward relieving  symptoms such as shortness of breath, decreased exercise tolerance, and extremity edema.   Expected Outcomes Improve functional capacity of life;Short term: Attendance in program 2-3 days a week with increased exercise capacity. Reported lower sodium intake. Reported increased fruit and vegetable intake. Reports medication compliance.;Short term: Daily weights obtained and reported for increase. Utilizing diuretic protocols set by physician.;Long term: Adoption of self-care skills and reduction of barriers for early signs and symptoms recognition and intervention leading to self-care maintenance.   Lipids Yes   Intervention Provide education and support for participant on nutrition & aerobic/resistive exercise along with prescribed medications to achieve LDL <50m, HDL >484m   Expected Outcomes Short Term: Participant states understanding of desired cholesterol values and is compliant with medications prescribed. Participant is following exercise prescription and nutrition guidelines.;Long Term: Cholesterol controlled with medications as prescribed, with individualized exercise RX and with personalized nutrition plan. Value goals: LDL < 7080mHDL > 40 mg.   Stress Yes   Intervention Offer individual and/or small group education and counseling on adjustment to heart disease, stress management and health-related lifestyle change. Teach and support self-help strategies.;Refer participants experiencing significant psychosocial distress to appropriate mental health specialists for further evaluation and treatment. When possible, include family members and significant others in education/counseling sessions.   Expected Outcomes Short Term: Participant demonstrates changes in health-related behavior, relaxation and other stress management skills, ability to obtain effective social support, and compliance with psychotropic medications if prescribed.;Long Term: Emotional wellbeing is indicated by absence of  clinically significant psychosocial distress or social isolation.      Core Components/Risk Factors/Patient Goals Review:      Goals and Risk Factor Review    Row Name 01/06/16 1007 01/18/16 1053           Core Components/Risk Factors/Patient Goals Review   Personal Goals Review Weight Management/Obesity;Sedentary;Increase Strength and Stamina;Develop more efficient breathing techniques such as purse lipped breathing and diaphragmatic breathing and practicing self-pacing with activity.;Improve shortness of breath with ADL's;Heart Failure;Lipids;Diabetes;Hypertension Weight Management/Obesity;Sedentary;Increase Strength and Stamina;Develop more efficient breathing techniques such as purse lipped breathing and diaphragmatic breathing and practicing self-pacing with activity.;Improve shortness of breath with ADL's;Hypertension;Lipids;Stress;Diabetes;Heart Failure      Review Reviewed goals with PauEddie Dibblesday. PauDeandrew off to a good start in rehab.  He is already feeling better and stronger since getting started.  He is doing some light stretches at home on his off days. His blood pressures and blood sugars have been good in rehab.  He has not had any problems with his statins.  His weight is trending down, but it was up today as he had not taken his Lasix yet today.  He was encouraged to watch his weight closely.  He does weigh daily.  His breathing is improving and he is using pursed lip breathing here and at home.      Expected Outcomes PauDonavynll come to exercise and education classes to work towards his goals. PauUkiahll continue to come to exercise and education classes to work on all of his risk factors.  We will continue to monitor him for progression and improvements.         Core Components/Risk Factors/Patient Goals at Discharge (Final Review):      Goals and Risk Factor Review - 01/18/16 1053      Core Components/Risk Factors/Patient Goals Review   Personal Goals Review Weight  Management/Obesity;Sedentary;Increase Strength and Stamina;Develop more efficient breathing techniques such as purse lipped breathing and diaphragmatic breathing and practicing self-pacing with activity.;Improve shortness of breath with ADL's;Hypertension;Lipids;Stress;Diabetes;Heart Failure   Review Jyair is off to a good start in rehab.  He is already feeling better and stronger since getting started.  He is doing some light stretches at home on his off days. His blood pressures and blood sugars have been good in rehab.  He has not had any problems with his statins.  His weight is trending down, but it was up today as he had not taken his Lasix yet today.  He was encouraged to watch his weight closely.  He does weigh daily.  His breathing is improving and he is using pursed lip breathing here and at home.   Expected Outcomes Ladarren will continue to come to exercise and education classes to work on all of his risk factors.  We will continue to monitor him for progression and improvements.      ITP Comments:     ITP Comments    Row Name 01/02/16 1444 01/02/16 1501 01/11/16 0742 02/03/16 0845 02/08/16 0624   ITP Comments Marvin has LVAD present.  Being admitted to Cardiac Rehab for Chronic Systolic Heart Failure.  Kenniel also has COPD and Sleep Apnea.  ECG today with atrial fibrillation, frequent PVCs, and occasional ventricular pacing.  Jeromiah's oxygen saturation dropped to 84% during 6 minute walk, but he recovered quickly.  Keniel states he has CPAP at home with1 liter of oxygen.  Occasionally he will use oxygen during the day for brief period if he is SOB from an activity.  SOB at intervals during 6 minute walk today.  No other cardiac symptoms reported or observed during orientation/med review.   Yassir has LVAD present.  Being admitted to Cardiac Rehab for Chronic Systolic Heart Failure.  Bufford also has COPD and Sleep Apnea.  ECG today with atrial fibrillation, frequent PVCs, and occasional ventricular pacing.   Erik's oxygen saturation dropped to 84% during 6 minute walk, but he recovered quickly.  Delmos states he has CPAP at home with1 liter of oxygen.  Occasionally he will use oxygen during the day for brief period if he is SOB from an activity. Will reach out to Dr. Stann Mainland for an order or script about the possibility of titrating oxygen during cardiac rehab.   SOB at intervals during 6 minute walk today.  No other cardiac symptoms reported or observed during orientation/med review.   30 day review. Continue with ITP unless changes noted by Medical Director at signature of review. Terrian called and said he is sorry that he can't attend Cardiac Rehab today since he has a nosebleed. 30 day review. Continue with ITP unless changes noted by Medical Director at signature of review.      Comments:

## 2016-02-08 NOTE — Progress Notes (Signed)
Daily Session Note  Patient Details  Name: ILEY BREEDEN MRN: [id number] Date of Birth: 1951/04/22 Referring Provider:   Flowsheet Row Cardiac Rehab from 01/02/2016 in [facility] Cardiac and Pulmonary Rehab  Referring Provider  Charlynn Grimes MD      Encounter Date: 02/08/2016  Check In:     Session Check In - 02/08/16 0758      Check-In   Location [facility]-Cardiac & Pulmonary Rehab   Staff Present [name], MA, ACSM RCEP, Exercise Physiologist;Susanne Bice, RN, BSN, CCRP   Supervising physician immediately available to respond to emergencies See telemetry face sheet for immediately available ER MD   Medication changes reported     No   Fall or balance concerns reported    No   Warm-up and Cool-down Performed on first and last piece of equipment   Resistance Training Performed Yes   VAD Patient? Yes     VAD patient   Has back up controller? Yes   Has spare charged batteries? Yes   Has battery cables? Yes   Has compatible battery clips? Yes     Pain Assessment   Multiple Pain Sites No         Goals Met:  Independence with exercise equipment Exercise tolerated well No report of cardiac concerns or symptoms Strength training completed today  Goals Unmet:  Not Applicable  Comments: Pt able to follow exercise prescription today without complaint.  Will continue to monitor for progression.    Dr. Emily Filbert is Medical Director for [place] and [name].

## 2016-02-10 ENCOUNTER — Encounter: Payer: Medicare Other | Admitting: *Deleted

## 2016-02-10 DIAGNOSIS — Z955 Presence of coronary angioplasty implant and graft: Secondary | ICD-10-CM | POA: Diagnosis not present

## 2016-02-10 DIAGNOSIS — Z95811 Presence of heart assist device: Secondary | ICD-10-CM

## 2016-02-10 NOTE — Progress Notes (Signed)
Daily Session Note  Patient Details  Name: William Bray MRN: [id number] Date of Birth: December 12, 1950 Referring Provider:   Flowsheet Row Cardiac Rehab from 01/02/2016 in [facility]  Referring Provider  Charlynn Grimes MD      Encounter Date: 02/10/2016  Check In:     Session Check In - 02/10/16 0848      Check-In   Location [facility]-Cardiac & Pulmonary Rehab   Staff Present Heath Lark, RN, BSN, CCRP;Aliyanah Rozas, RN, Levie Heritage, MA, ACSM RCEP, Exercise Physiologist   Supervising physician immediately available to respond to emergencies See telemetry face sheet for immediately available ER MD   Medication changes reported     No   Fall or balance concerns reported    No   Warm-up and Cool-down Performed on first and last piece of equipment   Resistance Training Performed Yes   VAD Patient? Yes     VAD patient   Has back up controller? Yes   Has spare charged batteries? Yes   Has battery cables? Yes   Has compatible battery clips? Yes     Pain Assessment   Currently in Pain? No/denies         Goals Met:  Proper associated with RPD/PD & O2 Sat No report of cardiac concerns or symptoms  Goals Unmet:  Not Applicable  Comments:     Dr. Emily Filbert is Medical Director for [place] and [name].

## 2016-02-13 ENCOUNTER — Encounter: Payer: Medicare Other | Admitting: *Deleted

## 2016-02-13 DIAGNOSIS — I5022 Chronic systolic (congestive) heart failure: Secondary | ICD-10-CM

## 2016-02-13 DIAGNOSIS — Z95811 Presence of heart assist device: Secondary | ICD-10-CM

## 2016-02-13 DIAGNOSIS — Z955 Presence of coronary angioplasty implant and graft: Secondary | ICD-10-CM | POA: Diagnosis not present

## 2016-02-13 NOTE — Progress Notes (Signed)
Daily Session Note  Patient Details  Name: William Bray MRN: [id number] Date of Birth: 1951/01/27 Referring Provider:   Flowsheet Row Cardiac Rehab from 01/02/2016 in [facility] Cardiac and Pulmonary Rehab  Referring Provider  Charlynn Grimes MD      Encounter Date: 02/13/2016  Check In:     Session Check In - 02/13/16 0853      Check-In   Location [facility]-Cardiac & Pulmonary Rehab   Staff Present Earlean Shawl, BS, ACSM CEP, Exercise Physiologist;Jessica Luan Pulling, MA, ACSM RCEP, Exercise Physiologist;Carroll Enterkin, RN, BSN   Supervising physician immediately available to respond to emergencies See telemetry face sheet for immediately available ER MD   Medication changes reported     No   Fall or balance concerns reported    No   Warm-up and Cool-down Performed on first and last piece of equipment   Resistance Training Performed No   VAD Patient? Yes     VAD patient   Has back up controller? Yes   Has spare charged batteries? Yes   Has battery cables? Yes   Has compatible battery clips? Yes     Pain Assessment   Currently in Pain? No/denies   Multiple Pain Sites No         Goals Met:  Independence with exercise equipment Exercise tolerated well Personal goals reviewed No report of cardiac concerns or symptoms  Goals Unmet:  Not Applicable  Comments: Pt able to follow exercise prescription today without complaint.  Will continue to monitor for progression.    Dr. Emily Filbert is Medical Director for [place] and [name].

## 2016-02-15 ENCOUNTER — Encounter: Payer: Medicare Other | Admitting: *Deleted

## 2016-02-15 DIAGNOSIS — Z955 Presence of coronary angioplasty implant and graft: Secondary | ICD-10-CM | POA: Diagnosis not present

## 2016-02-15 DIAGNOSIS — Z95811 Presence of heart assist device: Secondary | ICD-10-CM

## 2016-02-15 DIAGNOSIS — I5022 Chronic systolic (congestive) heart failure: Secondary | ICD-10-CM

## 2016-02-15 NOTE — Progress Notes (Signed)
Daily Session Note  Patient Details  Name: William Bray MRN: [id number] Date of Birth: [birth year] Referring Provider:   Flowsheet Row Cardiac Rehab from 01/02/2016 in [facility] Cardiac and Pulmonary Rehab  Referring Provider  Charlynn Grimes MD      Encounter Date: 02/15/2016  Check In:     Session Check In - 02/15/16 0843      Check-In   Location [facility]-Cardiac & Pulmonary Rehab   Staff Present [name], MA, ACSM RCEP, Exercise Physiologist;Amanda Oletta Darter, BA, ACSM CEP, Exercise Physiologist;Carroll Enterkin, RN, BSN   Supervising physician immediately available to respond to emergencies See telemetry face sheet for immediately available ER MD   Medication changes reported     No   Fall or balance concerns reported    No   Warm-up and Cool-down Performed on first and last piece of equipment   Resistance Training Performed Yes   VAD Patient? Yes     VAD patient   Has back up controller? Yes   Has spare charged batteries? Yes   Has battery cables? Yes   Has compatible battery clips? Yes     Pain Assessment   Currently in Pain? No/denies   Multiple Pain Sites No         Goals Met:  Independence with exercise equipment Exercise tolerated well No report of cardiac concerns or symptoms Strength training completed today  Goals Unmet:  Not Applicable  Comments: [name] was rushing in today. His blood pressure was up to 110 today.  He was not feeling a 100%.  His INR was back up to 3.7 again and his coumadin is being adjusted again.  He tried the treadmill today, but after just 3 minutes he felt wiped out. He thought his oxygen was low, it was 87-89% after resting for a little bit.  He decided to go on home after that but he had completed 35 min of exercise.  He is going to try to rest some, and then hopefully feel better on Friday.  His weight was back down today.   Dr. Emily Filbert is Medical Director for [place] and [name].

## 2016-02-21 DIAGNOSIS — M1A00X0 Idiopathic chronic gout, unspecified site, without tophus (tophi): Secondary | ICD-10-CM | POA: Insufficient documentation

## 2016-02-27 ENCOUNTER — Encounter: Payer: Medicare Other | Attending: Internal Medicine

## 2016-02-27 DIAGNOSIS — I5022 Chronic systolic (congestive) heart failure: Secondary | ICD-10-CM | POA: Insufficient documentation

## 2016-02-27 DIAGNOSIS — Z955 Presence of coronary angioplasty implant and graft: Secondary | ICD-10-CM | POA: Insufficient documentation

## 2016-02-27 DIAGNOSIS — Z9861 Coronary angioplasty status: Secondary | ICD-10-CM | POA: Insufficient documentation

## 2016-02-27 DIAGNOSIS — Z95811 Presence of heart assist device: Secondary | ICD-10-CM | POA: Insufficient documentation

## 2016-02-29 ENCOUNTER — Encounter: Payer: Medicare Other | Admitting: *Deleted

## 2016-02-29 DIAGNOSIS — I5022 Chronic systolic (congestive) heart failure: Secondary | ICD-10-CM | POA: Diagnosis not present

## 2016-02-29 DIAGNOSIS — Z9861 Coronary angioplasty status: Secondary | ICD-10-CM | POA: Diagnosis not present

## 2016-02-29 DIAGNOSIS — Z95811 Presence of heart assist device: Secondary | ICD-10-CM

## 2016-02-29 DIAGNOSIS — Z955 Presence of coronary angioplasty implant and graft: Secondary | ICD-10-CM | POA: Diagnosis present

## 2016-02-29 NOTE — Progress Notes (Signed)
Daily Session Note  Patient Details  Name: William Bray MRN: [id number] Date of Birth: 06/29/1950 Referring Provider:   Flowsheet Row Cardiac Rehab from 01/02/2016 in [facility] Cardiac and Pulmonary Rehab  Referring Provider  Rogers, Joseph MD      Encounter Date: 02/29/2016  Check In:     Session Check In - 02/29/16 0951      Check-In   Location [facility]-Cardiac & Pulmonary Rehab   Staff Present  , MA, ACSM RCEP, Exercise Physiologist;Amanda Sommer, BA, ACSM CEP, Exercise Physiologist;Carroll Enterkin, RN, BSN   Supervising physician immediately available to respond to emergencies See telemetry face sheet for immediately available ER MD   Medication changes reported     No   Fall or balance concerns reported    No   Warm-up and Cool-down Performed on first and last piece of equipment   Resistance Training Performed Yes   VAD Patient? Yes     VAD patient   Has back up controller? Yes   Has spare charged batteries? Yes   Has battery cables? Yes   Has compatible battery clips? Yes     Pain Assessment   Currently in Pain? No/denies   Multiple Pain Sites No         Goals Met:  Independence with exercise equipment Exercise tolerated well No report of cardiac concerns or symptoms Strength training completed today  Goals Unmet:  Not Applicable  Comments: Pt able to follow exercise prescription today without complaint.  Will continue to monitor for progression.    Dr. Mark Miller is Medical Director for [facility] and [name]. 

## 2016-03-02 ENCOUNTER — Encounter: Payer: Medicare Other | Admitting: *Deleted

## 2016-03-02 DIAGNOSIS — I5022 Chronic systolic (congestive) heart failure: Secondary | ICD-10-CM

## 2016-03-02 DIAGNOSIS — Z95811 Presence of heart assist device: Secondary | ICD-10-CM

## 2016-03-02 DIAGNOSIS — Z955 Presence of coronary angioplasty implant and graft: Secondary | ICD-10-CM | POA: Diagnosis not present

## 2016-03-02 NOTE — Progress Notes (Signed)
Daily Session Note  Patient Details  Name: William Bray MRN: [id number] Date of Birth: May 25, 1951 Referring Provider:   Flowsheet Row Cardiac Rehab from 01/02/2016 in [facility] Cardiac and Pulmonary Rehab  Referring Provider  Charlynn Grimes MD      Encounter Date: 03/02/2016  Check In:     Session Check In - 03/02/16 0947      Check-In   Location [facility]-Cardiac & Pulmonary Rehab   Staff Present Gerlene Burdock, RN, Levie Heritage, MA, ACSM RCEP, Exercise Physiologist;Laureen Janell Quiet, RRT, Respiratory Therapist   Supervising physician immediately available to respond to emergencies See telemetry face sheet for immediately available ER MD   Medication changes reported     No   Fall or balance concerns reported    No   Warm-up and Cool-down Performed on first and last piece of equipment   Resistance Training Performed Yes   VAD Patient? No     VAD patient   Has back up controller? Yes   Has spare charged batteries? Yes   Has battery cables? Yes   Has compatible battery clips? Yes     Pain Assessment   Currently in Pain? No/denies   Multiple Pain Sites No         Goals Met:  Independence with exercise equipment Exercise tolerated well No report of cardiac concerns or symptoms Strength training completed today  Goals Unmet:  Not Applicable  Comments: Pt able to follow exercise prescription today without complaint.  Will continue to monitor for progression.    Dr. Emily Filbert is Medical Director for [place] and [name].

## 2016-03-05 ENCOUNTER — Encounter: Payer: Medicare Other | Admitting: *Deleted

## 2016-03-05 DIAGNOSIS — Z955 Presence of coronary angioplasty implant and graft: Secondary | ICD-10-CM | POA: Diagnosis not present

## 2016-03-05 DIAGNOSIS — I5022 Chronic systolic (congestive) heart failure: Secondary | ICD-10-CM

## 2016-03-05 DIAGNOSIS — Z95811 Presence of heart assist device: Secondary | ICD-10-CM

## 2016-03-05 NOTE — Progress Notes (Signed)
Daily Session Note  Patient Details  Name: DANH BAYUS MRN: [id number] Date of Birth: 15-Dec-1950 Referring Provider:   Flowsheet Row Cardiac Rehab from 01/02/2016 in [facility] Cardiac and Pulmonary Rehab  Referring Provider  Charlynn Grimes MD      Encounter Date: 03/05/2016  Check In:     Session Check In - 03/05/16 0853      Check-In   Location [facility]-Cardiac & Pulmonary Rehab   Staff Present [name], MA, ACSM RCEP, Exercise Physiologist;Kelly Amedeo Plenty, BS, ACSM CEP, Exercise Physiologist;Carroll Enterkin, RN, BSN   Supervising physician immediately available to respond to emergencies See telemetry face sheet for immediately available ER MD   Medication changes reported     No   Fall or balance concerns reported    No   Warm-up and Cool-down Performed on first and last piece of equipment   Resistance Training Performed Yes   VAD Patient? Yes     VAD patient   Has back up controller? Yes   Has spare charged batteries? Yes   Has battery cables? Yes   Has compatible battery clips? Yes     Pain Assessment   Currently in Pain? No/denies   Multiple Pain Sites No         Goals Met:  Independence with exercise equipment Exercise tolerated well No report of cardiac concerns or symptoms Strength training completed today  Goals Unmet:  Not Applicable  Comments: Pt able to follow exercise prescription today without complaint.  Will continue to monitor for progression.    Dr. Emily Filbert is Medical Director for [place] and [name].

## 2016-03-07 ENCOUNTER — Encounter: Payer: Medicare Other | Admitting: *Deleted

## 2016-03-07 ENCOUNTER — Encounter: Payer: Self-pay | Admitting: *Deleted

## 2016-03-07 DIAGNOSIS — Z95811 Presence of heart assist device: Secondary | ICD-10-CM

## 2016-03-07 DIAGNOSIS — Z955 Presence of coronary angioplasty implant and graft: Secondary | ICD-10-CM | POA: Diagnosis not present

## 2016-03-07 DIAGNOSIS — I5022 Chronic systolic (congestive) heart failure: Secondary | ICD-10-CM

## 2016-03-07 NOTE — Progress Notes (Signed)
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)  Patient's Home Medications on Admission:  Current Outpatient Prescriptions:  .  albuterol (PROVENTIL) (2.5 MG/3ML) 0.083% nebulizer solution, Inhale 3 mLs into the lungs every 6 (six) hours as needed., Disp: , Rfl:  .  allopurinol (ZYLOPRIM) 100 MG tablet, Take 100 mg by mouth daily., Disp: , Rfl:  .  ALPRAZolam (XANAX) 0.5 MG tablet, Take 0.5 mg by mouth 2 (two) times daily as needed. ONE IN THE MORNING AND ONE AT BEDTIME PRN, Disp: , Rfl:  .  aspirin EC 81 MG tablet, Take 81 mg by mouth daily., Disp: , Rfl:  .  budesonide (PULMICORT) 0.5 MG/2ML nebulizer solution, Inhale 2 mLs into the lungs 2 (two) times daily., Disp: , Rfl:  .  cetirizine (ZYRTEC) 10 MG tablet, Take 10 mg by mouth daily., Disp: , Rfl:  .  ferrous sulfate 324 (65 Fe) MG TBEC, Take 324 mg by mouth every morning. WITH BREAKFAST, Disp: , Rfl:  .  FLUoxetine (PROZAC) 20 MG capsule, Take 80 mg by mouth daily., Disp: , Rfl:  .  gabapentin (NEURONTIN) 100 MG capsule, Take 100 mg by mouth daily., Disp: , Rfl:  .  gabapentin (NEURONTIN) 300 MG capsule, Take 300 mg by mouth at bedtime., Disp: , Rfl:  .  lisinopril (PRINIVIL,ZESTRIL) 2.5 MG tablet, Take 2.5 mg by mouth daily., Disp: , Rfl:  .  metoprolol tartrate (LOPRESSOR) 25 MG tablet, Take 25 mg by mouth 2 (two) times daily., Disp: , Rfl:  .  mirtazapine (REMERON) 30 MG tablet, Take 0.5 tablets by mouth at bedtime., Disp: , Rfl:  .   montelukast (SINGULAIR) 10 MG tablet, Take 10 mg by mouth at bedtime., Disp: , Rfl:  .  ondansetron (ZOFRAN) 4 MG tablet, Take 4 mg by mouth every 8 (eight) hours as needed for nausea., Disp: , Rfl:  .  oxyCODONE (OXY IR/ROXICODONE) 5 MG immediate release tablet, Take 1 tablet by mouth 4 (four) times daily as needed., Disp: , Rfl:  .  pantoprazole (PROTONIX) 40 MG tablet, Take 40 mg by mouth daily., Disp: , Rfl:  .  pravastatin (PRAVACHOL) 40 MG tablet, Take 40 mg by mouth at bedtime., Disp: , Rfl:  .  spironolactone (ALDACTONE) 25 MG tablet, Take 25 mg by mouth daily., Disp: , Rfl:  .  torsemide (DEMADEX) 20 MG tablet, Take 2 tablets by mouth daily., Disp: , Rfl:  .  umeclidinium-vilanterol (ANORO ELLIPTA) 62.5-25 MCG/INH AEPB, Inhale 1 puff into the lungs daily., Disp: , Rfl:  .  warfarin (COUMADIN) 5 MG tablet, Take 5 mg by mouth as directed. Taking 5 mg M, W, F and 4 mg T, Th, Sat, Sun., Disp: , Rfl:   Past Medical History: Past Medical History:  Diagnosis Date  . Arthritis   . Chronic back pain   . Diabetes mellitus without complication   . Hypertension   . Neuropathy     Tobacco Use: History  Smoking Status  . Never Smoker  Smokeless Tobacco  . Not on file    Labs: Recent Review Flowsheet Data    Labs for ITP Cardiac and Pulmonary Rehab Latest Ref Rng & Units 08/08/2012 04/28/2013 11/05/2013 05/30/2014 06/02/2014   Cholestrol 0 - 200 mg/dL 105 - 127 64 -   LDLCALC 0 - 100 mg/dL 37 - 74 27 -   HDL 40 - 60 mg/dL 33(L) - 33(L) 15(L) -   Trlycerides 0 - 200 mg/dL 173 - 99 108 -   Hemoglobin A1c 4.2 - 6.3 % - 5.9 - - 5.3       Exercise Target Goals:    Exercise Program Goal: Individual exercise prescription set with THRR, safety & activity barriers. Participant demonstrates ability to understand and report RPE using BORG scale, to self-measure pulse accurately, and to acknowledge the importance of the exercise prescription.  Exercise Prescription Goal: Starting with aerobic  activity 30 plus minutes a day, 3 days per week for initial exercise prescription. Provide home exercise prescription and guidelines that participant acknowledges understanding prior to discharge.  Activity Barriers & Risk Stratification:     Activity Barriers & Cardiac Risk Stratification - 01/02/16 1357      Activity Barriers & Cardiac Risk Stratification   Activity Barriers Arthritis;Back Problems;Shortness of Breath;Deconditioning;Muscular Weakness;Other (comment)   Comments Ilija has LVAD, Pacemaker, and Defibrillator.  Remington is on mutilple meds that could affect his BP and make him dizzy.   Must use doppler to check BP.     Cardiac Risk Stratification High      6 Minute Walk:     6 Minute Walk    Row Name 01/02/16 1300         6 Minute Walk   Phase Initial     Distance 753 feet     Walk Time 5.31 minutes     # of Rest Breaks 3  For oxygen desaturation to 84%: 23 sec, 11 sec, 7 sec.  Pt made quick recovery each time     MPH 1.61     METS 2.25     RPE 13     Perceived Dyspnea  4     VO2 Peak 7.87     Symptoms Yes (comment)     Comments Shortness of Breath     Resting HR 86 bpm     Resting BP -  96 SBP     Max Ex. HR 119 bpm     Max Ex. BP -  102 SBP     2 Minute Post BP -  SBP 102, rck 86       Interval HR   Baseline HR 86     1 Minute HR 96     2 Minute HR 99     3 Minute HR 119     4 Minute HR 104     5 Minute HR 96     6 Minute HR 99     2 Minute Post HR 88     Interval Heart Rate? Yes       Interval Oxygen   Interval Oxygen? Yes     Baseline Oxygen Saturation % 92 %     Baseline Liters of Oxygen 0 L  Room Air     1 Minute Oxygen Saturation % 88 %     1 Minute Liters of Oxygen 0 L     2 Minute Oxygen Saturation % 87 %  at 1:51 84%     2 Minute Liters of Oxygen  0 L     3 Minute Oxygen Saturation % 87 %  at 3:14 84%     3 Minute Liters of Oxygen 0 L     4 Minute Oxygen Saturation % 86 %  at 4 min 85%     4 Minute Liters of Oxygen 0 L     5  Minute Oxygen Saturation % 92 %     5 Minute Liters of Oxygen 0 L     6 Minute Oxygen Saturation % 89 %     6 Minute Liters of Oxygen 0 L     2 Minute Post Oxygen Saturation % 97 %     2 Minute Post Liters of Oxygen 0 L        Initial Exercise Prescription:     Initial Exercise Prescription - 01/02/16 1400      Date of Initial Exercise RX and Referring Provider   Date 01/02/16   Referring Provider Charlynn Grimes MD     Oxygen   Oxygen Continuous   Liters 2     Treadmill   MPH 1.4   Grade 0   Minutes 15   METs 2.07     NuStep   Level 1   Minutes 15   METs 2     REL-XR   Level 1   Minutes 15   METs 2     Prescription Details   Frequency (times per week) 3   Duration Progress to 45 minutes of aerobic exercise without signs/symptoms of physical distress     Intensity   THRR 40-80% of Max Heartrate 114-141   Ratings of Perceived Exertion 11-15   Perceived Dyspnea 0-4     Progression   Progression Continue to progress workloads to maintain intensity without signs/symptoms of physical distress.     Resistance Training   Training Prescription Yes   Weight 2 lbs   Reps 10-12      Perform Capillary Blood Glucose checks as needed.  Exercise Prescription Changes:     Exercise Prescription Changes    Row Name 01/19/16 1100 01/20/16 1000 02/01/16 1500 02/15/16 1500 02/29/16 1400     Exercise Review   Progression Yes Yes Yes Yes Yes     Response to Exercise   Blood Pressure (Admit) -  98 Dopplar  - -  100 dopplar -  100 dopplar -  96 dopplar   Blood Pressure (Exercise) -  98 Dopplar  - -  106 dopplar -  95 dopplar -  96 dopplar   Blood Pressure (Exit) -  94 Dopplar  - -  96 dopplar -  92 dopplar -  94 dopplar   Heart Rate (Admit) 96 bpm  - 91 bpm 60 bpm 80 bpm   Heart Rate (Exercise) 116 bpm  - 120 bpm 124 bpm 113 bpm   Heart Rate (Exit) 89 bpm  - 92 bpm 60 bpm 90 bpm   Rating of Perceived Exertion (Exercise) 13  - 13 13 13    Symptoms none none  none none none   Comments  - Home Exercise Guidelines given 01/20/16 Home Exercise Guidelines given 01/20/16 Home Exercise Guidelines given 01/20/16 Home Exercise Guidelines given 01/20/16   Duration Progress to 45 minutes of aerobic exercise without signs/symptoms of physical distress Progress to 45 minutes of aerobic exercise without signs/symptoms of physical distress Progress to 45 minutes of aerobic exercise without signs/symptoms of physical distress Progress to 45 minutes of aerobic exercise without signs/symptoms of physical distress  Progress to 45 minutes of aerobic exercise without signs/symptoms of physical distress   Intensity THRR unchanged THRR unchanged THRR unchanged THRR unchanged THRR unchanged     Progression   Progression Continue to progress workloads to maintain intensity without signs/symptoms of physical distress. Continue to progress workloads to maintain intensity without signs/symptoms of physical distress. Continue to progress workloads to maintain intensity without signs/symptoms of physical distress. Continue to progress workloads to maintain intensity without signs/symptoms of physical distress. Continue to progress workloads to maintain intensity without signs/symptoms of physical distress.   Average METs 2.67  - 2.35 2.93 2.75     Resistance Training   Training Prescription Yes Yes Yes Yes Yes   Weight 2 lbs 2 lbs 4 lbs 4 lbs 4 lbs   Reps 10-15 10-15 10-15 10-15 10-15     Interval Training   Interval Training No No No No No     Oxygen   Oxygen -  Has not needed oxygen for exercise yet -  Has not needed oxygen for exercise yet  -  -  -     Treadmill   MPH 2 2  - 2.2 2.2   Grade 0 0  - 1 1   Minutes 15 15  - 15 15   METs 2.53 2.53  - 2.99 2.99     NuStep   Level 3 3 3 4 4    Minutes 15 15 15 15 15    METs 2.5 2.5 2.5 2.3 2.5     REL-XR   Level 1 1 4 4 4    Minutes 15 15 15 15 15    METs 2.9 2.9 2.2 3.5  -     Home Exercise Plan   Plans to continue  exercise at  - Home  walking Home  walking Home  walking Home  walking   Frequency  - Add 2 additional days to program exercise sessions. Add 2 additional days to program exercise sessions. Add 2 additional days to program exercise sessions. Add 2 additional days to program exercise sessions.      Exercise Comments:     Exercise Comments    Row Name 01/02/16 1329 01/06/16 1011 01/19/16 1128 01/20/16 1004 01/20/16 1006   Exercise Comments Kaheem was to be more physcially healthy and more active by the time he finishes the program.  First full day of exercise!  Patient was oriented to gym and equipment including functions, settings, policies, and procedures.  Patient's individual exercise prescription and treatment plan were reviewed.  All starting workloads were established based on the results of the 6 minute walk test done at initial orientation visit.  The plan for exercise progression was also introduced and progression will be customized based on patient's performance and goals. Vishaal is off to a good start with exercise.  He is able to exercise the full 45 min already!  We will continue to monitor for progression. Reviewed METs average and discussed progression with pt today. Jatavian dropped out his arms on the XR to see if it would help with his back pain.  Reviewed home exercise with pt today.  Pt plans to walk at home for exercise.  Reviewed THR, pulse, RPE, sign and symptoms, and when to call 911 or MD.  Also discussed weather considerations and indoor options.  Pt voiced understanding.   Row Name 02/01/16 1534 02/06/16 0842 02/15/16 1521 02/29/16 1424     Exercise Comments Othel is doing well with exercise.  He is feeling stronger and  has more stamina.  He has been walking at home on his off days.  We will continue to monitor for progression. Reviewed METs average and discussed progression with pt today. Tyrone had a rough day today with exercise.  However, overall he is doing very well.  He is 3.5  METs on the XR.  We will continue to monitor for progress. Xian has been doing well in rehab.  We will continue to monitor his progression.       Discharge Exercise Prescription (Final Exercise Prescription Changes):     Exercise Prescription Changes - 02/29/16 1400      Exercise Review   Progression Yes     Response to Exercise   Blood Pressure (Admit) --  96 dopplar   Blood Pressure (Exercise) --  96 dopplar   Blood Pressure (Exit) --  94 dopplar   Heart Rate (Admit) 80 bpm   Heart Rate (Exercise) 113 bpm   Heart Rate (Exit) 90 bpm   Rating of Perceived Exertion (Exercise) 13   Symptoms none   Comments Home Exercise Guidelines given 01/20/16   Duration Progress to 45 minutes of aerobic exercise without signs/symptoms of physical distress   Intensity THRR unchanged     Progression   Progression Continue to progress workloads to maintain intensity without signs/symptoms of physical distress.   Average METs 2.75     Resistance Training   Training Prescription Yes   Weight 4 lbs   Reps 10-15     Interval Training   Interval Training No     Treadmill   MPH 2.2   Grade 1   Minutes 15   METs 2.99     NuStep   Level 4   Minutes 15   METs 2.5     REL-XR   Level 4   Minutes 15     Home Exercise Plan   Plans to continue exercise at Home  walking   Frequency Add 2 additional days to program exercise sessions.      Nutrition:  Target Goals: Understanding of nutrition guidelines, daily intake of sodium <1549m, cholesterol <207m calories 30% from fat and 7% or less from saturated fats, daily to have 5 or more servings of fruits and vegetables.  Biometrics:     Pre Biometrics - 01/02/16 1449      Pre Biometrics   Height 5' 9.5" (1.765 m)   Weight 182 lb 4.8 oz (82.7 kg)   Waist Circumference 39 inches   Hip Circumference 40 inches   Waist to Hip Ratio 0.98 %   BMI (Calculated) 26.6       Nutrition Therapy Plan and Nutrition Goals:   Nutrition  Discharge: Rate Your Plate Scores:     Nutrition Assessments - 01/09/16 1532      Rate Your Plate Scores   Pre Score 60   Pre Score % 66.6 %      Nutrition Goals Re-Evaluation:   Psychosocial: Target Goals: Acknowledge presence or absence of depression, maximize coping skills, provide positive support system. Participant is able to verbalize types and ability to use techniques and skills needed for reducing stress and depression.  Initial Review & Psychosocial Screening:     Initial Psych Review & Screening - 01/02/16 1412      Initial Review   Current issues with History of Depression;Current Psychotropic Meds     Family Dynamics   Good Support System? Yes   Comments PaConradoas been married to his wife, DeGareth Fitznerfor  44 years.  He describes her as an Building services engineer.  They have one son who is a paramedic and one daughter one is an Therapist, sports.  No grandchildren. Bryndon does not have a church affiliation, but states his wife does.       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  He has undergone counseling in the past for this reason.  He is currently on Prozac 80 mg once a day and Xanax 0.5 mg in the morning and 0.5 mg at night as needed.        Screening Interventions   Interventions Encouraged to exercise;Program counselor consult      Quality of Life Scores:     Quality of Life - 01/02/16 1521      Quality of Life Scores   Health/Function Pre 19.6 %   Socioeconomic Pre 22.5 %   Psych/Spiritual Pre 22.93 %   Family Pre 27.8 %   GLOBAL Pre 22.1 %      PHQ-9: Recent Review Flowsheet Data    Depression screen [facility] 2/9 01/02/2016   Decreased Interest 0   Down, Depressed, Hopeless 1   PHQ - 2 Score 1   Altered sleeping 2   Tired, decreased energy 1   Change in appetite 0   Feeling bad or failure about yourself  1   Trouble  concentrating 1   Moving slowly or fidgety/restless 1   Suicidal thoughts 0   PHQ-9 Score 7   Difficult doing work/chores Somewhat difficult      Psychosocial Evaluation and Intervention:     Psychosocial Evaluation - 02/01/16 0951      Psychosocial Evaluation & Interventions   Comments Follow up with Mr. Coury reporting he is feeling so much stronger since coming into this program.  He struggled walking from the car to the class and had to stop up to (3) times to catch his breath initially and now he reports walking in without stopping at all.  He states his wife has noticed his renewed energy and stamina and has made positive remarks to him about it.  He reports his mood remains positive and he is continuing to take medication to help with his sleep which continues to be 5-6 hours per night.  Counselor commended Mr. Houseworth on his commitment to exercise consistently and his progress made as a result.  Counselor will continue to follow with Mr. B throught the course of this program.        Psychosocial Re-Evaluation:     Psychosocial Re-Evaluation    Row Name 02/03/16 [phone] 02/29/16 0926           Psychosocial Re-Evaluation   Comments Niklaus called and said he is sorry that he can't attend Cardiac Rehab today since he has a nosebleed. Mr. Vandevoorde reports he is glad to be back to Cardiac Rehab as he has been in the hospital (last week) for pneumonia.  He states he is feeling a little weak still at times, but much better than he did prior to hospitalization.  He looks forward to gaining back his stamina and strength that he was experiencing prior to his sickness.  Counselor encouraged Mr. B and commended him for his commitment to exercise and this program.           Vocational Rehabilitation: Provide vocational rehab assistance to qualifying candidates.   Vocational  Rehab Evaluation & Intervention:     Vocational Rehab - 01/02/16 1401      Initial Vocational Rehab Evaluation &  Intervention   Assessment shows need for Vocational Rehabilitation No      Education: Education Goals: Education classes will be provided on a weekly basis, covering required topics. Participant will state understanding/return demonstration of topics presented.  Learning Barriers/Preferences:     Learning Barriers/Preferences - 01/02/16 1401      Learning Barriers/Preferences   Learning Preferences None      Education Topics: General Nutrition Guidelines/Fats and Fiber: -Group instruction provided by verbal, written material, models and posters to present the general guidelines for heart healthy nutrition. Gives an explanation and review of dietary fats and fiber. Flowsheet Row Cardiac Rehab [facility] 02/29/2016 in [facility] Cardiac and Pulmonary Rehab  Date  02/13/16  Educator  CR  Instruction Review Code  2- meets goals/outcomes      Controlling Sodium/Reading Food Labels: -Group verbal and written material supporting the discussion of sodium use in heart healthy nutrition. Review and explanation with models, verbal and written materials for utilization of the food label.   Exercise Physiology & Risk Factors: - Group verbal and written instruction with models to review the exercise physiology of the cardiovascular system and associated critical values. Details cardiovascular disease risk factors and the goals associated with each risk factor.   Aerobic Exercise & Resistance Training: - Gives group verbal and written discussion on the health impact of inactivity. On the components of aerobic and resistive training programs and the benefits of this training and how to safely progress through these programs. Flowsheet Row Cardiac Rehab from 02/29/2016 in [facility] Cardiac and Pulmonary Rehab  Date  02/29/16  Educator  [facility]  Instruction Review Code  2- meets goals/outcomes      Flexibility, Balance, General Exercise Guidelines: - Provides group verbal and written instruction on the benefits  of flexibility and balance training programs. Provides general exercise guidelines with specific guidelines to those with heart or lung disease. Demonstration and skill practice provided. Flowsheet Row Cardiac Rehab from 02/29/2016 in [facility] Cardiac and Pulmonary Rehab  Date  01/09/16  Educator  [facility]  Instruction Review Code  2- meets goals/outcomes      Stress Management: - Provides group verbal and written instruction about the health risks of elevated stress, cause of high stress, and healthy ways to reduce stress. Flowsheet Row Cardiac Rehab from 02/29/2016 in [facility] Cardiac and Pulmonary Rehab  Date  01/11/16  Educator  [facility]  Instruction Review Code  2- meets goals/outcomes      Depression: - Provides group verbal and written instruction on the correlation between heart/lung disease and depressed mood, treatment options, and the stigmas associated with seeking treatment.   Anatomy & Physiology of the Heart: - Group verbal and written instruction and models provide basic cardiac anatomy and physiology, with the coronary electrical and arterial systems. Review of: AMI, Angina, Valve disease, Heart Failure, Cardiac Arrhythmia, Pacemakers, and the ICD. Flowsheet Row Cardiac Rehab from 02/29/2016 in [facility] Cardiac and Pulmonary Rehab  Date  01/16/16  Educator  SB  Instruction Review Code  2- meets goals/outcomes      Cardiac Procedures: - Group verbal and written instruction and models to describe the testing methods done to diagnose heart disease. Reviews the outcomes of the test results. Describes the treatment choices: Medical Management, Angioplasty, or Coronary Bypass Surgery. Flowsheet Row Cardiac Rehab from 02/29/2016 in [facility] Cardiac and Pulmonary Rehab  Date  01/23/16  Educator  SB  Instruction Review Code  2- meets goals/outcomes      Cardiac Medications: - Group verbal and written instruction to review commonly prescribed medications for heart disease. Reviews the medication,  class of the drug, and side effects. Includes the steps to properly store meds and maintain the prescription regimen. Flowsheet Row Cardiac Rehab from 02/29/2016 in [facility] Cardiac and Pulmonary Rehab  Date  02/01/16 Marisue Humble 2]  Educator  SB  Instruction Review Code  2- meets goals/outcomes      Go Sex-Intimacy & Heart Disease, Get SMART - Goal Setting: - Group verbal and written instruction through game format to discuss heart disease and the return to sexual intimacy. Provides group verbal and written material to discuss and apply goal setting through the application of the S.M.A.R.T. Method. Flowsheet Row Cardiac Rehab from 02/29/2016 in [facility] Cardiac and Pulmonary Rehab  Date  01/23/16  Educator  SB  Instruction Review Code  2- meets goals/outcomes      Other Matters of the Heart: - Provides group verbal, written materials and models to describe Heart Failure, Angina, Valve Disease, and Diabetes in the realm of heart disease. Includes description of the disease process and treatment options available to the cardiac patient. Flowsheet Row Cardiac Rehab from 02/29/2016 in [facility] Cardiac and Pulmonary Rehab  Date  01/16/16  Educator  SB  Instruction Review Code  2- meets goals/outcomes      Exercise & Equipment Safety: - Individual verbal instruction and demonstration of equipment use and safety with use of the equipment. Flowsheet Row Cardiac Rehab from 02/29/2016 in [facility] Cardiac and Pulmonary Rehab  Date  01/02/16  Educator  D. Joya Gaskins, RN  Instruction Review Code  1- partially meets, needs review/practice      Infection Prevention: - Provides verbal and written material to individual with discussion of infection control including proper hand washing and proper equipment cleaning during exercise session. Flowsheet Row Cardiac Rehab from 02/29/2016 in [facility] Cardiac and Pulmonary Rehab  Date  01/02/16  Educator  D. Joya Gaskins, RN  Instruction Review Code  2- meets goals/outcomes      Falls  Prevention: - Provides verbal and written material to individual with discussion of falls prevention and safety. Flowsheet Row Cardiac Rehab from 02/29/2016 in [facility] Cardiac and Pulmonary Rehab  Date  01/02/16  Educator  D. Joya Gaskins, RN  Instruction Review Code  2- meets goals/outcomes      Diabetes: - Individual verbal and written instruction to review signs/symptoms of diabetes, desired ranges of glucose level fasting, after meals and with exercise. Advice that pre and post exercise glucose checks will be done for 3 sessions at entry of program. [place] from 02/29/2016 in [facility] Cardiac and Pulmonary Rehab  Date  01/02/16  Educator  D. Joya Gaskins, RN  Instruction Review Code  2- meets goals/outcomes       Knowledge Questionnaire Score:     Knowledge Questionnaire Score - 01/02/16 1424      Knowledge Questionnaire Score   Pre Score 20/28      Core Components/Risk Factors/Patient Goals at Admission:     Personal Goals and Risk Factors at Admission - 01/02/16 1408      Core Components/Risk Factors/Patient Goals on Admission    Weight Management Weight Maintenance   Sedentary Yes   Intervention Provide advice, education, support and counseling about physical activity/exercise needs.;Develop an individualized exercise prescription for aerobic and resistive training based on initial evaluation findings, risk stratification, comorbidities and participant's personal  goals.   Expected Outcomes Achievement of increased cardiorespiratory fitness and enhanced flexibility, muscular endurance and strength shown through measurements of functional capacity and personal statement of participant.   Increase Strength and Stamina Yes   Intervention Provide advice, education, support and counseling about physical activity/exercise needs.;Develop an individualized exercise prescription for aerobic and resistive training based on initial evaluation findings, risk stratification,  comorbidities and participant's personal goals.   Expected Outcomes Achievement of increased cardiorespiratory fitness and enhanced flexibility, muscular endurance and strength shown through measurements of functional capacity and personal statement of participant.   Improve shortness of breath with ADL's Yes   Intervention Provide education, individualized exercise plan and daily activity instruction to help decrease symptoms of SOB with activities of daily living.   Expected Outcomes Short Term: Achieves a reduction of symptoms when performing activities of daily living.   Develop more efficient breathing techniques such as purse lipped breathing and diaphragmatic breathing; and practicing self-pacing with activity Yes   Intervention Provide education, demonstration and support about specific breathing techniuqes utilized for more efficient breathing. Include techniques such as pursed lipped breathing, diaphragmatic breathing and self-pacing activity.   Expected Outcomes Short Term: Participant will be able to demonstrate and use breathing techniques as needed throughout daily activities.   Diabetes Yes   Intervention Provide education about signs/symptoms and action to take for hypo/hyperglycemia.;Provide education about proper nutrition, including hydration, and aerobic/resistive exercise prescription along with prescribed medications to achieve blood glucose in normal ranges: Fasting glucose 65-99 mg/dL   Expected Outcomes Short Term: Participant verbalizes understanding of the signs/symptoms and immediate care of hyper/hypoglycemia, proper foot care and importance of medication, aerobic/resistive exercise and nutrition plan for blood glucose control.;Long Term: Attainment of HbA1C < 7%.   Heart Failure Yes   Intervention Provide a combined exercise and nutrition program that is supplemented with education, support and counseling about heart failure. Directed toward relieving symptoms such as  shortness of breath, decreased exercise tolerance, and extremity edema.   Expected Outcomes Improve functional capacity of life;Short term: Attendance in program 2-3 days a week with increased exercise capacity. Reported lower sodium intake. Reported increased fruit and vegetable intake. Reports medication compliance.;Short term: Daily weights obtained and reported for increase. Utilizing diuretic protocols set by physician.;Long term: Adoption of self-care skills and reduction of barriers for early signs and symptoms recognition and intervention leading to self-care maintenance.   Lipids Yes   Intervention Provide education and support for participant on nutrition & aerobic/resistive exercise along with prescribed medications to achieve LDL <64m, HDL >443m   Expected Outcomes Short Term: Participant states understanding of desired cholesterol values and is compliant with medications prescribed. Participant is following exercise prescription and nutrition guidelines.;Long Term: Cholesterol controlled with medications as prescribed, with individualized exercise RX and with personalized nutrition plan. Value goals: LDL < 7081mHDL > 40 mg.   Stress Yes   Intervention Offer individual and/or small group education and counseling on adjustment to heart disease, stress management and health-related lifestyle change. Teach and support self-help strategies.;Refer participants experiencing significant psychosocial distress to appropriate mental health specialists for further evaluation and treatment. When possible, include family members and significant others in education/counseling sessions.   Expected Outcomes Short Term: Participant demonstrates changes in health-related behavior, relaxation and other stress management skills, ability to obtain effective social support, and compliance with psychotropic medications if prescribed.;Long Term: Emotional wellbeing is indicated by absence of clinically significant  psychosocial distress or social isolation.      Core Components/Risk  Factors/Patient Goals Review:      Goals and Risk Factor Review    Row Name 01/06/16 1007 01/18/16 1053 02/13/16 1021 02/15/16 0847       Core Components/Risk Factors/Patient Goals Review   Personal Goals Review Weight Management/Obesity;Sedentary;Increase Strength and Stamina;Develop more efficient breathing techniques such as purse lipped breathing and diaphragmatic breathing and practicing self-pacing with activity.;Improve shortness of breath with ADL's;Heart Failure;Lipids;Diabetes;Hypertension Weight Management/Obesity;Sedentary;Increase Strength and Stamina;Develop more efficient breathing techniques such as purse lipped breathing and diaphragmatic breathing and practicing self-pacing with activity.;Improve shortness of breath with ADL's;Hypertension;Lipids;Stress;Diabetes;Heart Failure Weight Management/Obesity;Sedentary;Increase Strength and Stamina;Hypertension;Lipids;Develop more efficient breathing techniques such as purse lipped breathing and diaphragmatic breathing and practicing self-pacing with activity.;Improve shortness of breath with ADL's;Stress;Heart Failure Weight Management/Obesity;Sedentary;Increase Strength and Stamina;Heart Failure;Improve shortness of breath with ADL's;Develop more efficient breathing techniques such as purse lipped breathing and diaphragmatic breathing and practicing self-pacing with activity.;Diabetes;Lipids;Hypertension;Stress    Review Reviewed goals with Eddie Dibbles today. Ketih is off to a good start in rehab.  He is already feeling better and stronger since getting started.  He is doing some light stretches at home on his off days. His blood pressures and blood sugars have been good in rehab.  He has not had any problems with his statins.  His weight is trending down, but it was up today as he had not taken his Lasix yet today.  He was encouraged to watch his weight closely.  He does weigh  daily.  His breathing is improving and he is using pursed lip breathing here and at home. Cyncere continues to do well.  He is continuing to get stronger and breathe better overall.  He is doing his walking at home.  His blood pressures and blood sugars and stressors have been good.  He is following his heart failure guidelines and weighing daily.  He has stopped writing it down, but plans to go back to doing that since her can't remember his weights.  His weight was up again today some and he is planning to take an extra Laisix.  If that doesn't help, he will call the doctor.   Other than today, Coben is feeling better overall.  He is feeling stronger and breathing better in general. He is also walking at home for 30 min everyday.  His blood sugars and blood pressures have been good.  He weighs daily, watches his salt intake, and monitors himself for symptoms of heart failure.  He has not had any problems taking his mediciations, other than coumadin being adjusted frequently.  He is enjoying his exercise as an outlet.    Expected Outcomes Ramond will come to exercise and education classes to work towards his goals. Derico will continue to come to exercise and education classes to work on all of his risk factors.  We will continue to monitor him for progression and improvements. Ilia will continue to come to exercise classes to work on his strength and staimina and weight loss.  We will continue to monitor. Ladarien will continue to come to exercise classes to work on strength and stamina and improved breathing techniques.  We will continue to monitor for progression.       Core Components/Risk Factors/Patient Goals at Discharge (Final Review):      Goals and Risk Factor Review - 02/15/16 0847      Core Components/Risk Factors/Patient Goals Review   Personal Goals Review Weight Management/Obesity;Sedentary;Increase Strength and Stamina;Heart Failure;Improve shortness of breath with ADL's;Develop more efficient  breathing techniques such  as purse lipped breathing and diaphragmatic breathing and practicing self-pacing with activity.;Diabetes;Lipids;Hypertension;Stress   Review Other than today, Tejay is feeling better overall.  He is feeling stronger and breathing better in general. He is also walking at home for 30 min everyday.  His blood sugars and blood pressures have been good.  He weighs daily, watches his salt intake, and monitors himself for symptoms of heart failure.  He has not had any problems taking his mediciations, other than coumadin being adjusted frequently.  He is enjoying his exercise as an outlet.   Expected Outcomes Authur will continue to come to exercise classes to work on strength and stamina and improved breathing techniques.  We will continue to monitor for progression.      ITP Comments:     ITP Comments    Row Name 01/02/16 1444 01/02/16 1501 01/11/16 0742 02/03/16 0845 02/08/16 0624   ITP Comments Shlomie has LVAD present.  Being admitted to Cardiac Rehab for Chronic Systolic Heart Failure.  Mead also has COPD and Sleep Apnea.  ECG today with atrial fibrillation, frequent PVCs, and occasional ventricular pacing.  Josh's oxygen saturation dropped to 84% during 6 minute walk, but he recovered quickly.  Montavis states he has CPAP at home with1 liter of oxygen.  Occasionally he will use oxygen during the day for brief period if he is SOB from an activity.  SOB at intervals during 6 minute walk today.  No other cardiac symptoms reported or observed during orientation/med review.   Zaide has LVAD present.  Being admitted to Cardiac Rehab for Chronic Systolic Heart Failure.  Abelardo also has COPD and Sleep Apnea.  ECG today with atrial fibrillation, frequent PVCs, and occasional ventricular pacing.  Delrick's oxygen saturation dropped to 84% during 6 minute walk, but he recovered quickly.  Corrigan states he has CPAP at home with1 liter of oxygen.  Occasionally he will use oxygen during the day for brief period  if he is SOB from an activity. Will reach out to Dr. Stann Mainland for an order or script about the possibility of titrating oxygen during cardiac rehab.   SOB at intervals during 6 minute walk today.  No other cardiac symptoms reported or observed during orientation/med review.   30 day review. Continue with ITP unless changes noted by Medical Director at signature of review. Kin called and said he is sorry that he can't attend Cardiac Rehab today since he has a nosebleed. 30 day review. Continue with ITP unless changes noted by Medical Director at signature of review.   Mitchell Name 02/10/16 0848 02/15/16 0847 03/07/16 1125       ITP Comments Since he on Coumdin yesterday he rubbed his arm against a bush and it started bleeding. It restarted today even using the treadmill. Pressure applied and applied coban pressure dressing.  Jashun was rushing in today. His blood pressure was up to 110 today.  He was not feeling a 100%.  His INR was back up to 3.7 again and his coumadin is being adjusted again.  He tried the treadmill today, but after just 3 minutes he felt wiped out. He thought his oxygen was low, it was 87-89% after resting for a little bit.  He decided to go on home after that but he had completed 35 min of exercise.  He is going to try to rest some, and then hopefully feel better on Friday.  His weight was back down today. 30 day review. Continue with ITP unless changes noted by  Medical Director at signature of review.        Comments:

## 2016-03-07 NOTE — Progress Notes (Signed)
Daily Session Note  Patient Details  Name: William Bray MRN: [id number] Date of Birth: May 14, 1951 Referring Provider:   Flowsheet Row Cardiac Rehab from 01/02/2016 in [facility] Cardiac and Pulmonary Rehab  Referring Provider  Charlynn Grimes MD      Encounter Date: 03/07/2016  Check In:     Session Check In - 03/07/16 0939      Check-In   Location [facility]-Cardiac & Pulmonary Rehab   Staff Present [name], MA, ACSM RCEP, Exercise Physiologist;Susanne Bice, RN, BSN, CCRP;Laureen Owens Shark, BS, RRT, Respiratory Therapist   Supervising physician immediately available to respond to emergencies See telemetry face sheet for immediately available ER MD   Medication changes reported     No   Fall or balance concerns reported    No   Warm-up and Cool-down Performed on first and last piece of equipment   Resistance Training Performed Yes   VAD Patient? Yes     VAD patient   Has back up controller? Yes   Has spare charged batteries? Yes   Has battery cables? Yes   Has compatible battery clips? Yes     Pain Assessment   Currently in Pain? No/denies   Multiple Pain Sites No         Goals Met:  Independence with exercise equipment Exercise tolerated well No report of cardiac concerns or symptoms Strength training completed today  Goals Unmet:  Not Applicable  Comments: Pt able to follow exercise prescription today without complaint.  Will continue to monitor for progression.    Dr. Emily Filbert is Medical Director for [place] and [name].

## 2016-03-09 ENCOUNTER — Encounter: Payer: Medicare Other | Admitting: *Deleted

## 2016-03-09 DIAGNOSIS — I5022 Chronic systolic (congestive) heart failure: Secondary | ICD-10-CM

## 2016-03-09 DIAGNOSIS — Z955 Presence of coronary angioplasty implant and graft: Secondary | ICD-10-CM | POA: Diagnosis not present

## 2016-03-09 DIAGNOSIS — Z95811 Presence of heart assist device: Secondary | ICD-10-CM

## 2016-03-09 NOTE — Progress Notes (Signed)
Daily Session Note  Patient Details  Name: William Bray MRN: [id number] Date of Birth: 04/23/1951 Referring Provider:   Flowsheet Row Cardiac Rehab from 01/02/2016 in [facility] Cardiac and Pulmonary Rehab  Referring Provider  Charlynn Grimes MD      Encounter Date: 03/09/2016  Check In:     Session Check In - 03/09/16 0907      Check-In   Location [facility]-Cardiac & Pulmonary Rehab   Staff Present Gerlene Burdock, RN, BSN;Susanne Bice, RN, BSN, CCRP;Jessica Luan Pulling, MA, ACSM RCEP, Exercise Physiologist   Supervising physician immediately available to respond to emergencies See telemetry face sheet for immediately available ER MD   Medication changes reported     No   Fall or balance concerns reported    No   Warm-up and Cool-down Performed on first and last piece of equipment   Resistance Training Performed Yes   VAD Patient? No     VAD patient   Has back up controller? Yes   Has spare charged batteries? Yes   Has battery cables? Yes   Has compatible battery clips? Yes     Pain Assessment   Currently in Pain? No/denies         Goals Met:  Proper associated with RPD/PD & O2 Sat Exercise tolerated well  Goals Unmet:  Not Applicable  Comments:     Dr. Emily Filbert is Medical Director for [place] and [name].

## 2016-03-19 ENCOUNTER — Encounter: Payer: Medicare Other | Admitting: *Deleted

## 2016-03-19 DIAGNOSIS — Z955 Presence of coronary angioplasty implant and graft: Secondary | ICD-10-CM | POA: Diagnosis not present

## 2016-03-19 DIAGNOSIS — I5022 Chronic systolic (congestive) heart failure: Secondary | ICD-10-CM

## 2016-03-19 DIAGNOSIS — Z95811 Presence of heart assist device: Secondary | ICD-10-CM

## 2016-03-19 NOTE — Progress Notes (Signed)
Daily Session Note  Patient Details  Name: William Bray MRN: [id number] Date of Birth: September 11, 1950 Referring Provider:   Flowsheet Row Cardiac Rehab from 01/02/2016 in [facility] Cardiac and Pulmonary Rehab  Referring Provider  Charlynn Grimes MD      Encounter Date: 03/19/2016  Check In:     Session Check In - 03/19/16 0852      Check-In   Location [facility]-Cardiac & Pulmonary Rehab   Staff Present [name], MA, ACSM RCEP, Exercise Physiologist;Kelly Amedeo Plenty, BS, ACSM CEP, Exercise Physiologist;Carroll Enterkin, RN, BSN   Supervising physician immediately available to respond to emergencies See telemetry face sheet for immediately available ER MD   Medication changes reported     No   Fall or balance concerns reported    No   Warm-up and Cool-down Performed on first and last piece of equipment   Resistance Training Performed Yes   VAD Patient? Yes     VAD patient   Has back up controller? Yes   Has spare charged batteries? Yes   Has battery cables? Yes   Has compatible battery clips? Yes     Pain Assessment   Currently in Pain? No/denies   Multiple Pain Sites No         Goals Met:  Independence with exercise equipment Exercise tolerated well No report of cardiac concerns or symptoms Strength training completed today  Goals Unmet:  Not Applicable  Comments: Pt able to follow exercise prescription today without complaint.  Will continue to monitor for progression.    Dr. Emily Filbert is Medical Director for [place] and [name].

## 2016-03-21 DIAGNOSIS — I5022 Chronic systolic (congestive) heart failure: Secondary | ICD-10-CM

## 2016-03-21 DIAGNOSIS — Z95811 Presence of heart assist device: Secondary | ICD-10-CM

## 2016-03-21 DIAGNOSIS — Z955 Presence of coronary angioplasty implant and graft: Secondary | ICD-10-CM | POA: Diagnosis not present

## 2016-03-21 NOTE — Progress Notes (Signed)
Daily Session Note  Patient Details  Name: William Bray MRN: [id number] Date of Birth: 1950-08-15 Referring Provider:   Flowsheet Row Cardiac Rehab from 01/02/2016 in [facility] Cardiac and Pulmonary Rehab  Referring Provider  Charlynn Grimes MD      Encounter Date: 03/21/2016  Check In:     Session Check In - 03/21/16 0805      Check-In   Location [facility]-Cardiac & Pulmonary Rehab   Staff Present Heath Lark, RN, BSN, CCRP;Jessica Luan Pulling, MA, ACSM RCEP, Exercise Physiologist;Josey Dettmann Oletta Darter, BA, ACSM CEP, Exercise Physiologist   Supervising physician immediately available to respond to emergencies See telemetry face sheet for immediately available ER MD   Medication changes reported     No   Fall or balance concerns reported    No   Warm-up and Cool-down Performed on first and last piece of equipment   Resistance Training Performed Yes   VAD Patient? Yes     VAD patient   Has back up controller? Yes   Has spare charged batteries? Yes   Has battery cables? Yes   Has compatible battery clips? Yes     Pain Assessment   Currently in Pain? No/denies         Goals Met:  Independence with exercise equipment Exercise tolerated well No report of cardiac concerns or symptoms Strength training completed today  Goals Unmet:  Not Applicable  Comments: Pauls O2 sats were 87 so he is using 1 L during education and 2L during exercise.   Dr. Emily Filbert is Medical Director for [place] and [name].

## 2016-03-26 ENCOUNTER — Encounter: Payer: Medicare Other | Admitting: *Deleted

## 2016-03-26 DIAGNOSIS — Z955 Presence of coronary angioplasty implant and graft: Secondary | ICD-10-CM | POA: Diagnosis not present

## 2016-03-26 DIAGNOSIS — I5022 Chronic systolic (congestive) heart failure: Secondary | ICD-10-CM

## 2016-03-26 DIAGNOSIS — Z95811 Presence of heart assist device: Secondary | ICD-10-CM

## 2016-03-26 NOTE — Progress Notes (Signed)
Daily Session Note  Patient Details  Name: William Bray MRN: [id number] Date of Birth: [birth year] Referring Provider:   Flowsheet Row Cardiac Rehab from 01/02/2016 in [facility] Cardiac and Pulmonary Rehab  Referring Provider  Charlynn Grimes MD      Encounter Date: 03/26/2016  Check In:     Session Check In - 03/26/16 0752      Check-In   Location [facility]-Cardiac & Pulmonary Rehab   Staff Present Gerlene Burdock, RN, Moises Blood, BS, ACSM CEP, Exercise Physiologist;Jessica Luan Pulling, [place], ACSM RCEP, Exercise Physiologist   Supervising physician immediately available to respond to emergencies See telemetry face sheet for immediately available ER MD   Medication changes reported     No   Fall or balance concerns reported    No   Warm-up and Cool-down Performed on first and last piece of equipment   Resistance Training Performed Yes   VAD Patient? Yes     VAD patient   Has back up controller? Yes   Has spare charged batteries? Yes   Has battery cables? Yes   Has compatible battery clips? Yes     Pain Assessment   Currently in Pain? No/denies   Multiple Pain Sites No         Goals Met:  Independence with exercise equipment Exercise tolerated well No report of cardiac concerns or symptoms Strength training completed today  Goals Unmet:  Not Applicable  Comments: Pt able to follow exercise prescription today without complaint.  Will continue to monitor for progression.      [place] Name 01/02/16 1300 03/26/16 0903       6 Minute Walk   Phase Initial Discharge    Distance 753 feet 1120 feet    Distance % Change  - 48.7 %  367 ft    Walk Time 5.31 minutes 6 minutes    # of Rest Breaks 3  For oxygen desaturation to 84%: 23 sec, 11 sec, 7 sec.  Pt made quick recovery each time 0    MPH 1.61 2.12    METS 2.25 2.82    RPE 13 13    Perceived Dyspnea  4 3    VO2 Peak 7.87 9.85    Symptoms Yes (comment) No    Comments Shortness of Breath  -    Resting  HR 86 bpm 90 bpm    Resting BP -  96 SBP -  94 dopplar    Max Ex. HR 119 bpm 111 bpm    Max Ex. BP -  102 SBP -  98 dopplar    2 Minute Post BP -  SBP 102, rck 86  -      Interval HR   Baseline HR 86  -    1 Minute HR 96  -    2 Minute HR 99  -    3 Minute HR 119  -    4 Minute HR 104  -    5 Minute HR 96  -    6 Minute HR 99  -    2 Minute Post HR 88  -    Interval Heart Rate? Yes  -      Interval Oxygen   Interval Oxygen? Yes  -    Baseline Oxygen Saturation % 92 % 95 %    Baseline Liters of Oxygen 0 L  Room Air 0 L    1 Minute Oxygen Saturation % 88 %  -  1 Minute Liters of Oxygen 0 L  -    2 Minute Oxygen Saturation % 87 %  at 1:51 84%  -    2 Minute Liters of Oxygen 0 L  -    3 Minute Oxygen Saturation % 87 %  at 3:14 84%  -    3 Minute Liters of Oxygen 0 L  -    4 Minute Oxygen Saturation % 86 %  at 4 min 85%  -    4 Minute Liters of Oxygen 0 L  -    5 Minute Oxygen Saturation % 92 %  -    5 Minute Liters of Oxygen 0 L  -    6 Minute Oxygen Saturation % 89 %  -    6 Minute Liters of Oxygen 0 L  -    2 Minute Post Oxygen Saturation % 97 %  -    2 Minute Post Liters of Oxygen 0 L  -          Dr. Emily Filbert is Medical Director for [place] and [name].

## 2016-03-28 ENCOUNTER — Encounter: Payer: Medicare Other | Attending: Internal Medicine | Admitting: *Deleted

## 2016-03-28 DIAGNOSIS — Z955 Presence of coronary angioplasty implant and graft: Secondary | ICD-10-CM | POA: Diagnosis not present

## 2016-03-28 DIAGNOSIS — Z9861 Coronary angioplasty status: Secondary | ICD-10-CM | POA: Insufficient documentation

## 2016-03-28 DIAGNOSIS — I5022 Chronic systolic (congestive) heart failure: Secondary | ICD-10-CM | POA: Insufficient documentation

## 2016-03-28 DIAGNOSIS — Z95811 Presence of heart assist device: Secondary | ICD-10-CM

## 2016-03-28 NOTE — Progress Notes (Signed)
Daily Session Note  Patient Details  Name: William Bray MRN: [id number] Date of Birth: 07-21-1950 Referring Provider:   Flowsheet Row Cardiac Rehab from 01/02/2016 in [facility] Cardiac and Pulmonary Rehab  Referring Provider  Charlynn Grimes MD      Encounter Date: 03/28/2016  Check In:     Session Check In - 03/28/16 0843      Check-In   Location [facility]-Cardiac & Pulmonary Rehab   Staff Present [name], MA, ACSM RCEP, Exercise Physiologist;Susanne Bice, RN, BSN, Lance Sell, BA, ACSM CEP, Exercise Physiologist   Supervising physician immediately available to respond to emergencies See telemetry face sheet for immediately available ER MD   Medication changes reported     No   Fall or balance concerns reported    No   Warm-up and Cool-down Performed on first and last piece of equipment   Resistance Training Performed Yes   VAD Patient? Yes     VAD patient   Has back up controller? Yes   Has spare charged batteries? Yes   Has battery cables? Yes   Has compatible battery clips? Yes     Pain Assessment   Currently in Pain? No/denies   Multiple Pain Sites No         Goals Met:  Independence with exercise equipment Exercise tolerated well No report of cardiac concerns or symptoms Strength training completed today  Goals Unmet:  Not Applicable  Comments: Pt able to follow exercise prescription today without complaint.  Will continue to monitor for progression.    Dr. Emily Filbert is Medical Director for [place] and [name].

## 2016-03-30 ENCOUNTER — Encounter: Payer: Medicare Other | Admitting: *Deleted

## 2016-03-30 DIAGNOSIS — I5022 Chronic systolic (congestive) heart failure: Secondary | ICD-10-CM

## 2016-03-30 DIAGNOSIS — Z955 Presence of coronary angioplasty implant and graft: Secondary | ICD-10-CM | POA: Diagnosis not present

## 2016-03-30 DIAGNOSIS — Z95811 Presence of heart assist device: Secondary | ICD-10-CM

## 2016-03-30 NOTE — Progress Notes (Signed)
Daily Session Note  Patient Details  Name: William Bray MRN: [id number] Date of Birth: Sep 05, 1950 Referring Provider:   Flowsheet Row Cardiac Rehab from 01/02/2016 in [facility]  Referring Provider  Charlynn Grimes MD      Encounter Date: 03/30/2016  Check In:     Session Check In - 03/30/16 1000      Check-In   Location [facility]-Cardiac & Pulmonary Rehab   Staff Present [name], MA, ACSM RCEP, Exercise Physiologist;Susanne Bice, RN, BSN, CCRP;Laureen Owens Shark, BS, RRT, Respiratory Therapist;Other   Supervising physician immediately available to respond to emergencies See telemetry face sheet for immediately available ER MD   Medication changes reported     No   Fall or balance concerns reported    No   Warm-up and Cool-down Performed on first and last piece of equipment   Resistance Training Performed Yes   VAD Patient? Yes     VAD patient   Has back up controller? Yes   Has spare charged batteries? Yes   Has battery cables? Yes   Has compatible battery clips? Yes     Pain Assessment   Currently in Pain? No/denies   Multiple Pain Sites No         Goals Met:  Independence with exercise equipment Exercise tolerated well No report of cardiac concerns or symptoms Strength training completed today  Goals Unmet:  Not Applicable  Comments: Pt able to follow exercise prescription today without complaint.  Will continue to monitor for progression.    Dr. Emily Filbert is Medical Director for [facility] and [name].

## 2016-03-30 NOTE — Patient Instructions (Signed)
Discharge Instructions  Patient Details  Name: William Bray MRN: [id number] Date of Birth: 18-Dec-1950 Referring Provider:  Charlynn Grimes, MD   Number of Visits: 36  Reason for Discharge:  Patient reached a stable level of exercise. Patient independent in their exercise.  Smoking History:  History  Smoking Status  . Never Smoker  Smokeless Tobacco  . 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)  Heart failure, chronic systolic (HCC)  Initial Exercise Prescription:     Initial Exercise Prescription - 01/02/16 1400      Date of Initial Exercise RX and Referring Provider   Date 01/02/16   Referring Provider Charlynn Grimes MD     Oxygen   Oxygen Continuous   Liters 2     Treadmill   MPH 1.4   Grade 0   Minutes 15   METs 2.07     NuStep   Level 1   Minutes 15   METs 2     REL-XR   Level 1   Minutes 15   METs 2     Prescription Details   Frequency (times per week) 3   Duration Progress to 45 minutes of aerobic exercise without signs/symptoms of physical distress     Intensity   THRR 40-80% of Max Heartrate 114-141   Ratings of Perceived Exertion 11-15   Perceived Dyspnea 0-4     Progression   Progression Continue to progress workloads to maintain intensity without signs/symptoms of physical distress.     Resistance Training   Training Prescription Yes   Weight 2 lbs   Reps 10-12      Discharge Exercise Prescription (Final Exercise Prescription Changes):     Exercise Prescription Changes - 03/28/16 1500      Exercise Review   Progression Yes     Response to Exercise   Blood Pressure (Admit) --  104 dopplar   Blood Pressure (Exercise) --  82 dopplar   Blood Pressure (Exit) --  86 dopplar   Heart Rate (Admit) 93 bpm   Heart Rate (Exercise) 119 bpm   Heart Rate (Exit) 86 bpm   Rating of Perceived Exertion (Exercise) 15   Symptoms none   Comments Home Exercise Guidelines given 01/20/16   Duration  Progress to 45 minutes of aerobic exercise without signs/symptoms of physical distress   Intensity THRR unchanged     Progression   Progression Continue to progress workloads to maintain intensity without signs/symptoms of physical distress.   Average METs 2.4     Resistance Training   Training Prescription Yes   Weight 4 lbs   Reps 10-15     Interval Training   Interval Training No     Treadmill   MPH 2.2   Grade 1   Minutes 15   METs 2.99     NuStep   Level 4   Minutes 15   METs 2.2     REL-XR   Level 4   Minutes 15   METs 2     Home Exercise Plan   Plans to continue exercise at Home  walking   Frequency Add 2 additional days to program exercise sessions.      Functional Capacity:     6 Minute Walk    Row Name 01/02/16 1300 03/26/16 0903       6 Minute Walk   Phase Initial Discharge    Distance 753 feet 1120 feet    Distance %  Change  - 48.7 %  367 ft    Walk Time 5.31 minutes 6 minutes    # of Rest Breaks 3  For oxygen desaturation to 84%: 23 sec, 11 sec, 7 sec.  Pt made quick recovery each time 0    MPH 1.61 2.12    METS 2.25 2.82    RPE 13 13    Perceived Dyspnea  4 3    VO2 Peak 7.87 9.85    Symptoms Yes (comment) No    Comments Shortness of Breath  -    Resting HR 86 bpm 90 bpm    Resting BP -  96 SBP -  94 dopplar    Max Ex. HR 119 bpm 111 bpm    Max Ex. BP -  102 SBP -  98 dopplar    2 Minute Post BP -  SBP 102, rck 86  -      Interval HR   Baseline HR 86  -    1 Minute HR 96  -    2 Minute HR 99  -    3 Minute HR 119  -    4 Minute HR 104  -    5 Minute HR 96  -    6 Minute HR 99  -    2 Minute Post HR 88  -    Interval Heart Rate? Yes  -      Interval Oxygen   Interval Oxygen? Yes  -    Baseline Oxygen Saturation % 92 % 95 %    Baseline Liters of Oxygen 0 L  Room Air 0 L    1 Minute Oxygen Saturation % 88 %  -    1 Minute Liters of Oxygen 0 L  -    2 Minute Oxygen Saturation % 87 %  at 1:51 84%  -    2 Minute Liters  of Oxygen 0 L  -    3 Minute Oxygen Saturation % 87 %  at 3:14 84%  -    3 Minute Liters of Oxygen 0 L  -    4 Minute Oxygen Saturation % 86 %  at 4 min 85%  -    4 Minute Liters of Oxygen 0 L  -    5 Minute Oxygen Saturation % 92 %  -    5 Minute Liters of Oxygen 0 L  -    6 Minute Oxygen Saturation % 89 %  -    6 Minute Liters of Oxygen 0 L  -    2 Minute Post Oxygen Saturation % 97 %  -    2 Minute Post Liters of Oxygen 0 L  -       Quality of Life:     Quality of Life - 01/02/16 1521      Quality of Life Scores   Health/Function Pre 19.6 %   Socioeconomic Pre 22.5 %   Psych/Spiritual Pre 22.93 %   Family Pre 27.8 %   GLOBAL Pre 22.1 %      Personal Goals: Goals established at orientation with interventions provided to work toward goal.     Personal Goals and Risk Factors at Admission - 01/02/16 1408      Core Components/Risk Factors/Patient Goals on Admission    Weight Management Weight Maintenance   Sedentary Yes   Intervention Provide advice, education, support and counseling about physical activity/exercise needs.;Develop an individualized exercise prescription for aerobic and resistive training based  on initial evaluation findings, risk stratification, comorbidities and participant's personal goals.   Expected Outcomes Achievement of increased cardiorespiratory fitness and enhanced flexibility, muscular endurance and strength shown through measurements of functional capacity and personal statement of participant.   Increase Strength and Stamina Yes   Intervention Provide advice, education, support and counseling about physical activity/exercise needs.;Develop an individualized exercise prescription for aerobic and resistive training based on initial evaluation findings, risk stratification, comorbidities and participant's personal goals.   Expected Outcomes Achievement of increased cardiorespiratory fitness and enhanced flexibility, muscular endurance and strength  shown through measurements of functional capacity and personal statement of participant.   Improve shortness of breath with ADL's Yes   Intervention Provide education, individualized exercise plan and daily activity instruction to help decrease symptoms of SOB with activities of daily living.   Expected Outcomes Short Term: Achieves a reduction of symptoms when performing activities of daily living.   Develop more efficient breathing techniques such as purse lipped breathing and diaphragmatic breathing; and practicing self-pacing with activity Yes   Intervention Provide education, demonstration and support about specific breathing techniuqes utilized for more efficient breathing. Include techniques such as pursed lipped breathing, diaphragmatic breathing and self-pacing activity.   Expected Outcomes Short Term: Participant will be able to demonstrate and use breathing techniques as needed throughout daily activities.   Diabetes Yes   Intervention Provide education about signs/symptoms and action to take for hypo/hyperglycemia.;Provide education about proper nutrition, including hydration, and aerobic/resistive exercise prescription along with prescribed medications to achieve blood glucose in normal ranges: Fasting glucose 65-99 mg/dL   Expected Outcomes Short Term: Participant verbalizes understanding of the signs/symptoms and immediate care of hyper/hypoglycemia, proper foot care and importance of medication, aerobic/resistive exercise and nutrition plan for blood glucose control.;Long Term: Attainment of HbA1C < 7%.   Heart Failure Yes   Intervention Provide a combined exercise and nutrition program that is supplemented with education, support and counseling about heart failure. Directed toward relieving symptoms such as shortness of breath, decreased exercise tolerance, and extremity edema.   Expected Outcomes Improve functional capacity of life;Short term: Attendance in program 2-3 days a week with  increased exercise capacity. Reported lower sodium intake. Reported increased fruit and vegetable intake. Reports medication compliance.;Short term: Daily weights obtained and reported for increase. Utilizing diuretic protocols set by physician.;Long term: Adoption of self-care skills and reduction of barriers for early signs and symptoms recognition and intervention leading to self-care maintenance.   Lipids Yes   Intervention Provide education and support for participant on nutrition & aerobic/resistive exercise along with prescribed medications to achieve LDL <58m, HDL >458m   Expected Outcomes Short Term: Participant states understanding of desired cholesterol values and is compliant with medications prescribed. Participant is following exercise prescription and nutrition guidelines.;Long Term: Cholesterol controlled with medications as prescribed, with individualized exercise RX and with personalized nutrition plan. Value goals: LDL < 7062mHDL > 40 mg.   Stress Yes   Intervention Offer individual and/or small group education and counseling on adjustment to heart disease, stress management and health-related lifestyle change. Teach and support self-help strategies.;Refer participants experiencing significant psychosocial distress to appropriate mental health specialists for further evaluation and treatment. When possible, include family members and significant others in education/counseling sessions.   Expected Outcomes Short Term: Participant demonstrates changes in health-related behavior, relaxation and other stress management skills, ability to obtain effective social support, and compliance with psychotropic medications if prescribed.;Long Term: Emotional wellbeing is indicated by absence of clinically significant psychosocial distress or  social isolation.       Personal Goals Discharge:     Goals and Risk Factor Review - 03/26/16 0846      Core Components/Risk Factors/Patient Goals Review    Personal Goals Review Sedentary;Weight Management/Obesity;Increase Strength and Stamina;Improve shortness of breath with ADL's;Develop more efficient breathing techniques such as purse lipped breathing and diaphragmatic breathing and practicing self-pacing with activity.;Diabetes;Heart Failure;Lipids;Stress   Review Kirsten was admitted last week to be dieuresed.  They took off 12 lbs!  He is feeling much better.  His weight today was 180.1 lbs.  Greatly improved.  His breathing is better and he is using his PLB.  He is doing well on his statins and numbers were good last week.  His stress is better.  His blood pressure and blood sugars have been good.   Expected Outcomes Nikalas will continue to come to class to work on strength and stamina and breathing.  We will continue to monitor.      Nutrition & Weight - Outcomes:     Pre Biometrics - 01/02/16 1449      Pre Biometrics   Height 5' 9.5" (1.765 m)   Weight 182 lb 4.8 oz (82.7 kg)   Waist Circumference 39 inches   Hip Circumference 40 inches   Waist to Hip Ratio 0.98 %   BMI (Calculated) 26.6       Nutrition:   Nutrition Discharge:     Nutrition Assessments - 01/09/16 1532      Rate Your Plate Scores   Pre Score 60   Pre Score % 66.6 %      Education Questionnaire Score:     Knowledge Questionnaire Score - 01/02/16 1424      Knowledge Questionnaire Score   Pre Score 20/28      Goals reviewed with patient; copy given to patient.

## 2016-04-02 ENCOUNTER — Encounter: Payer: Medicare Other | Admitting: *Deleted

## 2016-04-02 DIAGNOSIS — Z955 Presence of coronary angioplasty implant and graft: Secondary | ICD-10-CM | POA: Diagnosis not present

## 2016-04-02 DIAGNOSIS — I5022 Chronic systolic (congestive) heart failure: Secondary | ICD-10-CM

## 2016-04-02 DIAGNOSIS — Z95811 Presence of heart assist device: Secondary | ICD-10-CM

## 2016-04-02 NOTE — Progress Notes (Signed)
Discharge Summary  Patient Details  Name: William Bray MRN: [id number] Date of Birth: Jul 29, 1950 Referring Provider:   Flowsheet Row Cardiac Rehab from 01/02/2016 in [facility] Cardiac and Pulmonary Rehab  Referring Provider  Charlynn Grimes MD       Number of Visits: 36  Reason for Discharge:  Patient reached a stable level of exercise. Patient independent in their exercise.  Smoking History:  History  Smoking Status  . Never Smoker  Smokeless Tobacco  . 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)  Heart failure, chronic systolic (HCC)  ADL [facility]:   Initial Exercise Prescription:     Initial Exercise Prescription - 01/02/16 1400      Date of Initial Exercise RX and Referring Provider   Date 01/02/16   Referring Provider Charlynn Grimes MD     Oxygen   Oxygen Continuous   Liters 2     Treadmill   MPH 1.4   Grade 0   Minutes 15   METs 2.07     NuStep   Level 1   Minutes 15   METs 2     REL-XR   Level 1   Minutes 15   METs 2     Prescription Details   Frequency (times per week) 3   Duration Progress to 45 minutes of aerobic exercise without signs/symptoms of physical distress     Intensity   THRR 40-80% of Max Heartrate 114-141   Ratings of Perceived Exertion 11-15   Perceived Dyspnea 0-4     Progression   Progression Continue to progress workloads to maintain intensity without signs/symptoms of physical distress.     Resistance Training   Training Prescription Yes   Weight 2 lbs   Reps 10-12      Discharge Exercise Prescription (Final Exercise Prescription Changes):     Exercise Prescription Changes - 03/28/16 1500      Exercise Review   Progression Yes     Response to Exercise   Blood Pressure (Admit) --  104 dopplar   Blood Pressure (Exercise) --  82 dopplar   Blood Pressure (Exit) --  86 dopplar   Heart Rate (Admit) 93 bpm   Heart Rate (Exercise) 119 bpm   Heart Rate (Exit) 86 bpm    Rating of Perceived Exertion (Exercise) 15   Symptoms none   Comments Home Exercise Guidelines given 01/20/16   Duration Progress to 45 minutes of aerobic exercise without signs/symptoms of physical distress   Intensity THRR unchanged     Progression   Progression Continue to progress workloads to maintain intensity without signs/symptoms of physical distress.   Average METs 2.4     Resistance Training   Training Prescription Yes   Weight 4 lbs   Reps 10-15     Interval Training   Interval Training No     Treadmill   MPH 2.2   Grade 1   Minutes 15   METs 2.99     NuStep   Level 4   Minutes 15   METs 2.2     REL-XR   Level 4   Minutes 15   METs 2     Home Exercise Plan   Plans to continue exercise at Home  walking   Frequency Add 2 additional days to program exercise sessions.      Functional Capacity:     6 Minute Walk    Row Name 01/02/16 1300 03/26/16 4163  6 Minute Walk   Phase Initial Discharge    Distance 753 feet 1120 feet    Distance % Change  - 48.7 %  367 ft    Walk Time 5.31 minutes 6 minutes    # of Rest Breaks 3  For oxygen desaturation to 84%: 23 sec, 11 sec, 7 sec.  Pt made quick recovery each time 0    MPH 1.61 2.12    METS 2.25 2.82    RPE 13 13    Perceived Dyspnea  4 3    VO2 Peak 7.87 9.85    Symptoms Yes (comment) No    Comments Shortness of Breath  -    Resting HR 86 bpm 90 bpm    Resting BP -  96 SBP -  94 dopplar    Max Ex. HR 119 bpm 111 bpm    Max Ex. BP -  102 SBP -  98 dopplar    2 Minute Post BP -  SBP 102, rck 86  -      Interval HR   Baseline HR 86  -    1 Minute HR 96  -    2 Minute HR 99  -    3 Minute HR 119  -    4 Minute HR 104  -    5 Minute HR 96  -    6 Minute HR 99  -    2 Minute Post HR 88  -    Interval Heart Rate? Yes  -      Interval Oxygen   Interval Oxygen? Yes  -    Baseline Oxygen Saturation % 92 % 95 %    Baseline Liters of Oxygen 0 L  Room Air 0 L    1 Minute Oxygen  Saturation % 88 %  -    1 Minute Liters of Oxygen 0 L  -    2 Minute Oxygen Saturation % 87 %  at 1:51 84%  -    2 Minute Liters of Oxygen 0 L  -    3 Minute Oxygen Saturation % 87 %  at 3:14 84%  -    3 Minute Liters of Oxygen 0 L  -    4 Minute Oxygen Saturation % 86 %  at 4 min 85%  -    4 Minute Liters of Oxygen 0 L  -    5 Minute Oxygen Saturation % 92 %  -    5 Minute Liters of Oxygen 0 L  -    6 Minute Oxygen Saturation % 89 %  -    6 Minute Liters of Oxygen 0 L  -    2 Minute Post Oxygen Saturation % 97 %  -    2 Minute Post Liters of Oxygen 0 L  -       Psychological, QOL, Others - Outcomes: PHQ 2/9: Depression screen PHQ 2/9 01/02/2016  Decreased Interest 0  Down, Depressed, Hopeless 1  PHQ - 2 Score 1  Altered sleeping 2  Tired, decreased energy 1  Change in appetite 0  Feeling bad or failure about yourself  1  Trouble concentrating 1  Moving slowly or fidgety/restless 1  Suicidal thoughts 0  PHQ-9 Score 7  Difficult doing work/chores Somewhat difficult    Quality of Life:     Quality of Life - 01/02/16 1521      Quality of Life Scores   Health/Function Pre 19.6 %   Socioeconomic  Pre 22.5 %   Psych/Spiritual Pre 22.93 %   Family Pre 27.8 %   GLOBAL Pre 22.1 %      Personal Goals: Goals established at orientation with interventions provided to work toward goal.     Personal Goals and Risk Factors at Admission - 01/02/16 1408      Core Components/Risk Factors/Patient Goals on Admission    Weight Management Weight Maintenance   Sedentary Yes   Intervention Provide advice, education, support and counseling about physical activity/exercise needs.;Develop an individualized exercise prescription for aerobic and resistive training based on initial evaluation findings, risk stratification, comorbidities and participant's personal goals.   Expected Outcomes Achievement of increased cardiorespiratory fitness and enhanced flexibility, muscular endurance and  strength shown through measurements of functional capacity and personal statement of participant.   Increase Strength and Stamina Yes   Intervention Provide advice, education, support and counseling about physical activity/exercise needs.;Develop an individualized exercise prescription for aerobic and resistive training based on initial evaluation findings, risk stratification, comorbidities and participant's personal goals.   Expected Outcomes Achievement of increased cardiorespiratory fitness and enhanced flexibility, muscular endurance and strength shown through measurements of functional capacity and personal statement of participant.   Improve shortness of breath with ADL's Yes   Intervention Provide education, individualized exercise plan and daily activity instruction to help decrease symptoms of SOB with activities of daily living.   Expected Outcomes Short Term: Achieves a reduction of symptoms when performing activities of daily living.   Develop more efficient breathing techniques such as purse lipped breathing and diaphragmatic breathing; and practicing self-pacing with activity Yes   Intervention Provide education, demonstration and support about specific breathing techniuqes utilized for more efficient breathing. Include techniques such as pursed lipped breathing, diaphragmatic breathing and self-pacing activity.   Expected Outcomes Short Term: Participant will be able to demonstrate and use breathing techniques as needed throughout daily activities.   Diabetes Yes   Intervention Provide education about signs/symptoms and action to take for hypo/hyperglycemia.;Provide education about proper nutrition, including hydration, and aerobic/resistive exercise prescription along with prescribed medications to achieve blood glucose in normal ranges: Fasting glucose 65-99 mg/dL   Expected Outcomes Short Term: Participant verbalizes understanding of the signs/symptoms and immediate care of  hyper/hypoglycemia, proper foot care and importance of medication, aerobic/resistive exercise and nutrition plan for blood glucose control.;Long Term: Attainment of HbA1C < 7%.   Heart Failure Yes   Intervention Provide a combined exercise and nutrition program that is supplemented with education, support and counseling about heart failure. Directed toward relieving symptoms such as shortness of breath, decreased exercise tolerance, and extremity edema.   Expected Outcomes Improve functional capacity of life;Short term: Attendance in program 2-3 days a week with increased exercise capacity. Reported lower sodium intake. Reported increased fruit and vegetable intake. Reports medication compliance.;Short term: Daily weights obtained and reported for increase. Utilizing diuretic protocols set by physician.;Long term: Adoption of self-care skills and reduction of barriers for early signs and symptoms recognition and intervention leading to self-care maintenance.   Lipids Yes   Intervention Provide education and support for participant on nutrition & aerobic/resistive exercise along with prescribed medications to achieve LDL <1m, HDL >421m   Expected Outcomes Short Term: Participant states understanding of desired cholesterol values and is compliant with medications prescribed. Participant is following exercise prescription and nutrition guidelines.;Long Term: Cholesterol controlled with medications as prescribed, with individualized exercise RX and with personalized nutrition plan. Value goals: LDL < 7027mHDL > 40 mg.   Stress  Yes   Intervention Offer individual and/or small group education and counseling on adjustment to heart disease, stress management and health-related lifestyle change. Teach and support self-help strategies.;Refer participants experiencing significant psychosocial distress to appropriate mental health specialists for further evaluation and treatment. When possible, include family  members and significant others in education/counseling sessions.   Expected Outcomes Short Term: Participant demonstrates changes in health-related behavior, relaxation and other stress management skills, ability to obtain effective social support, and compliance with psychotropic medications if prescribed.;Long Term: Emotional wellbeing is indicated by absence of clinically significant psychosocial distress or social isolation.       Personal Goals Discharge:     Goals and Risk Factor Review    Row Name 01/06/16 1007 01/18/16 1053 02/13/16 1021 02/15/16 0847 03/26/16 0846     Core Components/Risk Factors/Patient Goals Review   Personal Goals Review Weight Management/Obesity;Sedentary;Increase Strength and Stamina;Develop more efficient breathing techniques such as purse lipped breathing and diaphragmatic breathing and practicing self-pacing with activity.;Improve shortness of breath with ADL's;Heart Failure;Lipids;Diabetes;Hypertension Weight Management/Obesity;Sedentary;Increase Strength and Stamina;Develop more efficient breathing techniques such as purse lipped breathing and diaphragmatic breathing and practicing self-pacing with activity.;Improve shortness of breath with ADL's;Hypertension;Lipids;Stress;Diabetes;Heart Failure Weight Management/Obesity;Sedentary;Increase Strength and Stamina;Hypertension;Lipids;Develop more efficient breathing techniques such as purse lipped breathing and diaphragmatic breathing and practicing self-pacing with activity.;Improve shortness of breath with ADL's;Stress;Heart Failure Weight Management/Obesity;Sedentary;Increase Strength and Stamina;Heart Failure;Improve shortness of breath with ADL's;Develop more efficient breathing techniques such as purse lipped breathing and diaphragmatic breathing and practicing self-pacing with activity.;Diabetes;Lipids;Hypertension;Stress Sedentary;Weight Management/Obesity;Increase Strength and Stamina;Improve shortness of breath  with ADL's;Develop more efficient breathing techniques such as purse lipped breathing and diaphragmatic breathing and practicing self-pacing with activity.;Diabetes;Heart Failure;Lipids;Stress   Review Reviewed goals with Eddie Dibbles today. Coye is off to a good start in rehab.  He is already feeling better and stronger since getting started.  He is doing some light stretches at home on his off days. His blood pressures and blood sugars have been good in rehab.  He has not had any problems with his statins.  His weight is trending down, but it was up today as he had not taken his Lasix yet today.  He was encouraged to watch his weight closely.  He does weigh daily.  His breathing is improving and he is using pursed lip breathing here and at home. Brett continues to do well.  He is continuing to get stronger and breathe better overall.  He is doing his walking at home.  His blood pressures and blood sugars and stressors have been good.  He is following his heart failure guidelines and weighing daily.  He has stopped writing it down, but plans to go back to doing that since her can't remember his weights.  His weight was up again today some and he is planning to take an extra Laisix.  If that doesn't help, he will call the doctor.   Other than today, Sho is feeling better overall.  He is feeling stronger and breathing better in general. He is also walking at home for 30 min everyday.  His blood sugars and blood pressures have been good.  He weighs daily, watches his salt intake, and monitors himself for symptoms of heart failure.  He has not had any problems taking his mediciations, other than coumadin being adjusted frequently.  He is enjoying his exercise as an outlet. Hearl was admitted last week to be dieuresed.  They took off 12 lbs!  He is feeling much better.  His weight  today was 180.1 lbs.  Greatly improved.  His breathing is better and he is using his PLB.  He is doing well on his statins and numbers were good last  week.  His stress is better.  His blood pressure and blood sugars have been good.   Expected Outcomes Arlin will come to exercise and education classes to work towards his goals. Patricia will continue to come to exercise and education classes to work on all of his risk factors.  We will continue to monitor him for progression and improvements. Asaiah will continue to come to exercise classes to work on his strength and staimina and weight loss.  We will continue to monitor. Koran will continue to come to exercise classes to work on strength and stamina and improved breathing techniques.  We will continue to monitor for progression. Landan will continue to come to class to work on strength and stamina and breathing.  We will continue to monitor.      Nutrition & Weight - Outcomes:     Pre Biometrics - 01/02/16 1449      Pre Biometrics   Height 5' 9.5" (1.765 m)   Weight 182 lb 4.8 oz (82.7 kg)   Waist Circumference 39 inches   Hip Circumference 40 inches   Waist to Hip Ratio 0.98 %   BMI (Calculated) 26.6       Nutrition:   Nutrition Discharge:     Nutrition Assessments - 01/09/16 1532      Rate Your Plate Scores   Pre Score 60   Pre Score % 66.6 %      Education Questionnaire Score:     Knowledge Questionnaire Score - 01/02/16 1424      Knowledge Questionnaire Score   Pre Score 20/28      Goals reviewed with patient; copy given to patient.

## 2016-04-02 NOTE — Progress Notes (Signed)
Daily Session Note  Patient Details  Name: William Bray MRN: [id number] Date of Birth: 03-26-1951 Referring Provider:   Flowsheet Row Cardiac Rehab from 01/02/2016 in [facility] Cardiac and Pulmonary Rehab  Referring Provider  Charlynn Grimes MD      Encounter Date: 04/02/2016  Check In:     Session Check In - 04/02/16 0826      Check-In   Location [facility]-Cardiac & Pulmonary Rehab   Staff Present Earlean Shawl, BS, ACSM CEP, Exercise Physiologist;Carroll Enterkin, RN, Levie Heritage, MA, ACSM RCEP, Exercise Physiologist   Supervising physician immediately available to respond to emergencies See telemetry face sheet for immediately available ER MD   Medication changes reported     No   Fall or balance concerns reported    No   Warm-up and Cool-down Performed on first and last piece of equipment   Resistance Training Performed Yes   VAD Patient? Yes     VAD patient   Has back up controller? Yes   Has spare charged batteries? Yes   Has battery cables? Yes   Has compatible battery clips? Yes     Pain Assessment   Currently in Pain? No/denies   Multiple Pain Sites No         Goals Met:  Independence with exercise equipment Exercise tolerated well No report of cardiac concerns or symptoms Strength training completed today  Goals Unmet:  Not Applicable  Comments: Pt able to follow exercise prescription today without complaint.  Will continue to monitor for progression.  [name] graduated today from cardiac rehab with 36 sessions completed.  Details of the patient's exercise prescription and what He needs to do in order to continue the prescription and progress were discussed with patient.  Patient was given a copy of prescription and goals.  Patient verbalized understanding.  [name] plans to continue to exercise by coming to [employer].   Dr. Emily Filbert is Medical Director for [name].

## 2016-04-02 NOTE — Progress Notes (Signed)
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  albuterol (PROVENTIL) (2.5 MG/3ML) 0.083% nebulizer solution, Inhale 3 mLs into the lungs every 6 (six) hours as needed., Disp: , Rfl:  .  allopurinol (ZYLOPRIM) 100 MG tablet, Take 100 mg by mouth daily., Disp: , Rfl:  .  ALPRAZolam (XANAX) 0.5 MG tablet, Take 0.5 mg by mouth 2 (two) times daily as needed. ONE IN THE MORNING AND ONE AT BEDTIME PRN, Disp: , Rfl:  .  aspirin EC 81 MG tablet, Take 81 mg by mouth daily., Disp: , Rfl:  .  budesonide (PULMICORT) 0.5 MG/2ML nebulizer solution, Inhale 2 mLs into the lungs 2 (two) times daily., Disp: , Rfl:  .  cetirizine (ZYRTEC) 10 MG tablet, Take 10 mg by mouth daily., Disp: , Rfl:  .  ferrous sulfate 324 (65 Fe) MG TBEC, Take 324 mg by mouth every morning. WITH BREAKFAST, Disp: , Rfl:  .  FLUoxetine (PROZAC) 20 MG capsule, Take 80 mg by mouth daily., Disp: , Rfl:  .  gabapentin (NEURONTIN) 100 MG capsule, Take 100 mg by mouth daily., Disp: , Rfl:  .  gabapentin (NEURONTIN) 300 MG capsule, Take 300 mg by mouth at bedtime., Disp: , Rfl:  .  lisinopril (PRINIVIL,ZESTRIL) 2.5 MG tablet, Take 2.5 mg by mouth daily., Disp: , Rfl:  .  metoprolol tartrate (LOPRESSOR) 25 MG tablet, Take 25 mg by mouth 2 (two) times daily., Disp: , Rfl:  .  mirtazapine (REMERON) 30 MG tablet, Take 0.5 tablets by mouth at bedtime., Disp: , Rfl:  .   montelukast (SINGULAIR) 10 MG tablet, Take 10 mg by mouth at bedtime., Disp: , Rfl:  .  ondansetron (ZOFRAN) 4 MG tablet, Take 4 mg by mouth every 8 (eight) hours as needed for nausea., Disp: , Rfl:  .  oxyCODONE (OXY IR/ROXICODONE) 5 MG immediate release tablet, Take 1 tablet by mouth 4 (four) times daily as needed., Disp: , Rfl:  .  pantoprazole (PROTONIX) 40 MG tablet, Take 40 mg by mouth daily., Disp: , Rfl:  .  pravastatin (PRAVACHOL) 40 MG tablet, Take 40 mg by mouth at bedtime., Disp: , Rfl:  .  spironolactone (ALDACTONE) 25 MG tablet, Take 25 mg by mouth daily., Disp: , Rfl:  .  torsemide (DEMADEX) 20 MG tablet, Take 2 tablets by mouth daily., Disp: , Rfl:  .  umeclidinium-vilanterol (ANORO ELLIPTA) 62.5-25 MCG/INH AEPB, Inhale 1 puff into the lungs daily., Disp: , Rfl:  .  warfarin (COUMADIN) 5 MG tablet, Take 5 mg by mouth as directed. Taking 5 mg M, W, F and 4 mg T, Th, Sat, Sun., Disp: , Rfl:   Past Medical History: Past Medical History:  Diagnosis Date  . Arthritis   . Chronic back pain   . Diabetes mellitus without complication   . Hypertension   . Neuropathy     Tobacco Use: History  Smoking Status  . Never Smoker  Smokeless Tobacco  . Not on file    Labs: Recent Review Flowsheet Data    Labs for ITP Cardiac and Pulmonary Rehab Latest Ref Rng & Units 08/08/2012 04/28/2013 11/05/2013 05/30/2014 06/02/2014   Cholestrol 0 - 200 mg/dL 105 - 127 64 -   LDLCALC 0 - 100 mg/dL 37 - 74 27 -   HDL 40 - 60 mg/dL 33(L) - 33(L) 15(L) -   Trlycerides 0 - 200 mg/dL 173 - 99 108 -   Hemoglobin A1c 4.2 - 6.3 % - 5.9 - - 5.3       Exercise Target Goals:    Exercise Program Goal: Individual exercise prescription set with THRR, safety & activity barriers. Participant demonstrates ability to understand and report RPE using BORG scale, to self-measure pulse accurately, and to acknowledge the importance of the exercise prescription.  Exercise Prescription Goal: Starting with aerobic  activity 30 plus minutes a day, 3 days per week for initial exercise prescription. Provide home exercise prescription and guidelines that participant acknowledges understanding prior to discharge.  Activity Barriers & Risk Stratification:     Activity Barriers & Cardiac Risk Stratification - 01/02/16 1357      Activity Barriers & Cardiac Risk Stratification   Activity Barriers Arthritis;Back Problems;Shortness of Breath;Deconditioning;Muscular Weakness;Other (comment)   Comments Baruc has LVAD, Pacemaker, and Defibrillator.  Quin is on mutilple meds that could affect his BP and make him dizzy.   Must use doppler to check BP.     Cardiac Risk Stratification High      6 Minute Walk:     6 Minute Walk    Row Name 01/02/16 1300 03/26/16 0903       6 Minute Walk   Phase Initial Discharge    Distance 753 feet 1120 feet    Distance % Change  - 48.7 %  367 ft    Walk Time 5.31 minutes 6 minutes    # of Rest Breaks 3  For oxygen desaturation to 84%: 23 sec, 11 sec, 7 sec.  Pt made quick recovery each time 0    MPH 1.61 2.12    METS 2.25 2.82    RPE 13 13    Perceived Dyspnea  4 3    VO2 Peak 7.87 9.85    Symptoms Yes (comment) No    Comments Shortness of Breath  -    Resting HR 86 bpm 90 bpm    Resting BP -  96 SBP -  94 dopplar    Max Ex. HR 119 bpm 111 bpm    Max Ex. BP -  102 SBP -  98 dopplar    2 Minute Post BP -  SBP 102, rck 86  -      Interval HR   Baseline HR 86  -    1 Minute HR 96  -    2 Minute HR 99  -    3 Minute HR 119  -    4 Minute HR 104  -    5 Minute HR 96  -    6 Minute HR 99  -    2 Minute Post HR 88  -    Interval Heart Rate? Yes  -      Interval Oxygen   Interval Oxygen? Yes  -    Baseline Oxygen Saturation % 92 % 95 %    Baseline Liters of Oxygen 0 L  Room Air 0 L    1 Minute Oxygen Saturation %  88 %  -    1 Minute Liters of Oxygen 0 L  -    2 Minute Oxygen Saturation % 87 %  at 1:51 84%  -    2 Minute Liters of Oxygen 0 L  -    3  Minute Oxygen Saturation % 87 %  at 3:14 84%  -    3 Minute Liters of Oxygen 0 L  -    4 Minute Oxygen Saturation % 86 %  at 4 min 85%  -    4 Minute Liters of Oxygen 0 L  -    5 Minute Oxygen Saturation % 92 %  -    5 Minute Liters of Oxygen 0 L  -    6 Minute Oxygen Saturation % 89 %  -    6 Minute Liters of Oxygen 0 L  -    2 Minute Post Oxygen Saturation % 97 %  -    2 Minute Post Liters of Oxygen 0 L  -       Initial Exercise Prescription:     Initial Exercise Prescription - 01/02/16 1400      Date of Initial Exercise RX and Referring Provider   Date 01/02/16   Referring Provider Charlynn Grimes MD     Oxygen   Oxygen Continuous   Liters 2     Treadmill   MPH 1.4   Grade 0   Minutes 15   METs 2.07     NuStep   Level 1   Minutes 15   METs 2     REL-XR   Level 1   Minutes 15   METs 2     Prescription Details   Frequency (times per week) 3   Duration Progress to 45 minutes of aerobic exercise without signs/symptoms of physical distress     Intensity   THRR 40-80% of Max Heartrate 114-141   Ratings of Perceived Exertion 11-15   Perceived Dyspnea 0-4     Progression   Progression Continue to progress workloads to maintain intensity without signs/symptoms of physical distress.     Resistance Training   Training Prescription Yes   Weight 2 lbs   Reps 10-12      Perform Capillary Blood Glucose checks as needed.  Exercise Prescription Changes:     Exercise Prescription Changes    Row Name 01/19/16 1100 01/20/16 1000 02/01/16 1500 02/15/16 1500 02/29/16 1400     Exercise Review   Progression Yes Yes Yes Yes Yes     Response to Exercise   Blood Pressure (Admit) -  98 Dopplar  - -  100 dopplar -  100 dopplar -  96 dopplar   Blood Pressure (Exercise) -  98 Dopplar  - -  106 dopplar -  95 dopplar -  96 dopplar   Blood Pressure (Exit) -  94 Dopplar  - -  96 dopplar -  92 dopplar -  94 dopplar   Heart Rate (Admit) 96 bpm  - 91 bpm 60 bpm  80 bpm   Heart Rate (Exercise) 116 bpm  - 120 bpm 124 bpm 113 bpm   Heart Rate (Exit) 89 bpm  - 92 bpm 60 bpm 90 bpm   Rating of Perceived Exertion (Exercise) 13  - 13 13 13    Symptoms none none none none none   Comments  - Home Exercise Guidelines given 01/20/16 Home Exercise Guidelines given 01/20/16 Home Exercise Guidelines given 01/20/16 Home Exercise Guidelines given 01/20/16  Duration Progress to 45 minutes of aerobic exercise without signs/symptoms of physical distress Progress to 45 minutes of aerobic exercise without signs/symptoms of physical distress Progress to 45 minutes of aerobic exercise without signs/symptoms of physical distress Progress to 45 minutes of aerobic exercise without signs/symptoms of physical distress Progress to 45 minutes of aerobic exercise without signs/symptoms of physical distress   Intensity THRR unchanged THRR unchanged THRR unchanged THRR unchanged THRR unchanged     Progression   Progression Continue to progress workloads to maintain intensity without signs/symptoms of physical distress. Continue to progress workloads to maintain intensity without signs/symptoms of physical distress. Continue to progress workloads to maintain intensity without signs/symptoms of physical distress. Continue to progress workloads to maintain intensity without signs/symptoms of physical distress. Continue to progress workloads to maintain intensity without signs/symptoms of physical distress.   Average METs 2.67  - 2.35 2.93 2.75     Resistance Training   Training Prescription Yes Yes Yes Yes Yes   Weight 2 lbs 2 lbs 4 lbs 4 lbs 4 lbs   Reps 10-15 10-15 10-15 10-15 10-15     Interval Training   Interval Training No No No No No     Oxygen   Oxygen -  Has not needed oxygen for exercise yet -  Has not needed oxygen for exercise yet  -  -  -     Treadmill   MPH 2 2  - 2.2 2.2   Grade 0 0  - 1 1   Minutes 15 15  - 15 15   METs 2.53 2.53  - 2.99 2.99     NuStep   Level 3 3  3 4 4    Minutes 15 15 15 15 15    METs 2.5 2.5 2.5 2.3 2.5     REL-XR   Level 1 1 4 4 4    Minutes 15 15 15 15 15    METs 2.9 2.9 2.2 3.5  -     Home Exercise Plan   Plans to continue exercise at  - Home  walking Home  walking Home  walking Home  walking   Frequency  - Add 2 additional days to program exercise sessions. Add 2 additional days to program exercise sessions. Add 2 additional days to program exercise sessions. Add 2 additional days to program exercise sessions.   Row Name 03/14/16 1500 03/28/16 1500           Exercise Review   Progression Yes Yes        Response to Exercise   Blood Pressure (Admit) -  96 dopplar -  104 dopplar      Blood Pressure (Exercise) -  94 dopplar -  82 dopplar      Blood Pressure (Exit) -  96 dopplar -  86 dopplar      Heart Rate (Admit) 52 bpm 93 bpm      Heart Rate (Exercise) 117 bpm 119 bpm      Heart Rate (Exit) 94 bpm 86 bpm      Rating of Perceived Exertion (Exercise) 13 15      Symptoms none none      Comments Home Exercise Guidelines given 01/20/16 Home Exercise Guidelines given 01/20/16      Duration Progress to 45 minutes of aerobic exercise without signs/symptoms of physical distress Progress to 45 minutes of aerobic exercise without signs/symptoms of physical distress      Intensity THRR unchanged THRR unchanged  Progression   Progression Continue to progress workloads to maintain intensity without signs/symptoms of physical distress. Continue to progress workloads to maintain intensity without signs/symptoms of physical distress.      Average METs 2.8 2.4        Resistance Training   Training Prescription Yes Yes      Weight 4 lbs 4 lbs      Reps 10-15 10-15        Interval Training   Interval Training No No        Treadmill   MPH 2.2 2.2      Grade 1 1      Minutes 15 15      METs 2.99 2.99        NuStep   Level 4 4      Minutes 15 15      METs 2.3 2.2        REL-XR   Level 4 4      Minutes 15 15       METs 3.1 2        Home Exercise Plan   Plans to continue exercise at Home  walking Home  walking      Frequency Add 2 additional days to program exercise sessions. Add 2 additional days to program exercise sessions.         Exercise Comments:     Exercise Comments    Row Name 01/02/16 1329 01/06/16 1011 01/19/16 1128 01/20/16 1004 01/20/16 1006   Exercise Comments Bernarr was to be more physcially healthy and more active by the time he finishes the program.  First full day of exercise!  Patient was oriented to gym and equipment including functions, settings, policies, and procedures.  Patient's individual exercise prescription and treatment plan were reviewed.  All starting workloads were established based on the results of the 6 minute walk test done at initial orientation visit.  The plan for exercise progression was also introduced and progression will be customized based on patient's performance and goals. Miraj is off to a good start with exercise.  He is able to exercise the full 45 min already!  We will continue to monitor for progression. Reviewed METs average and discussed progression with pt today. Tynan dropped out his arms on the XR to see if it would help with his back pain.  Reviewed home exercise with pt today.  Pt plans to walk at home for exercise.  Reviewed THR, pulse, RPE, sign and symptoms, and when to call 911 or MD.  Also discussed weather considerations and indoor options.  Pt voiced understanding.   Row Name 02/01/16 1534 02/06/16 6387 02/15/16 1521 02/29/16 1424 03/09/16 1012   Exercise Comments Jaycen is doing well with exercise.  He is feeling stronger and has more stamina.  He has been walking at home on his off days.  We will continue to monitor for progression. Reviewed METs average and discussed progression with pt today. Denilson had a rough day today with exercise.  However, overall he is doing very well.  He is 3.5 METs on the XR.  We will continue to monitor for progress.  Otis has been doing well in rehab.  We will continue to monitor his progression. Reviewed METs average and discussed progression with pt today.   [place] Name 03/14/16 1506 03/21/16 0809 03/26/16 0850 03/28/16 1556 03/30/16 1033   Exercise Comments Araf continues to do well with exercise.   He has gotten up to 3.1 METs on  the XR.  We will continue to monitor his progression. Pauls O2 sats were 87 so he is using 1 L during education and 2L during exercise. Warden was admitted last Wednesday for fluid control.  They took off 12 lbs.  He is feeling much better and SaO2 was 95% today.  He completed his post 6 min walk today. Zebbie continues to do well with exercise.  He works hard when he is here.  He will be graduating soon.  We will continue to montior his progress. Reviewed METs average and discussed progression with pt today.   [place] Name 04/02/16 0911           Exercise Comments  Anthonie graduated today from cardiac rehab with 36 sessions completed.  Details of the patient's exercise prescription and what He needs to do in order to continue the prescription and progress were discussed with patient.  Patient was given a copy of prescription and goals.  Patient verbalized understanding.  Jakim plans to continue to exercise by coming to [employer].          Discharge Exercise Prescription (Final Exercise Prescription Changes):     Exercise Prescription Changes - 03/28/16 1500      Exercise Review   Progression Yes     Response to Exercise   Blood Pressure (Admit) --  104 dopplar   Blood Pressure (Exercise) --  82 dopplar   Blood Pressure (Exit) --  86 dopplar   Heart Rate (Admit) 93 bpm   Heart Rate (Exercise) 119 bpm   Heart Rate (Exit) 86 bpm   Rating of Perceived Exertion (Exercise) 15   Symptoms none   Comments Home Exercise Guidelines given 01/20/16   Duration Progress to 45 minutes of aerobic exercise without signs/symptoms of physical distress   Intensity THRR unchanged     Progression    Progression Continue to progress workloads to maintain intensity without signs/symptoms of physical distress.   Average METs 2.4     Resistance Training   Training Prescription Yes   Weight 4 lbs   Reps 10-15     Interval Training   Interval Training No     Treadmill   MPH 2.2   Grade 1   Minutes 15   METs 2.99     NuStep   Level 4   Minutes 15   METs 2.2     REL-XR   Level 4   Minutes 15   METs 2     Home Exercise Plan   Plans to continue exercise at Home  walking   Frequency Add 2 additional days to program exercise sessions.      Nutrition:  Target Goals: Understanding of nutrition guidelines, daily intake of sodium <1562m, cholesterol <2063m calories 30% from fat and 7% or less from saturated fats, daily to have 5 or more servings of fruits and vegetables.  Biometrics:     Pre Biometrics - 01/02/16 1449      Pre Biometrics   Height 5' 9.5" (1.765 m)   Weight 182 lb 4.8 oz (82.7 kg)   Waist Circumference 39 inches   Hip Circumference 40 inches   Waist to Hip Ratio 0.98 %   BMI (Calculated) 26.6       Nutrition Therapy Plan and Nutrition Goals:   Nutrition Discharge: Rate Your Plate Scores:     Nutrition Assessments - 01/09/16 1532      Rate Your Plate Scores   Pre Score 60   Pre Score %  66.6 %      Nutrition Goals Re-Evaluation:   Psychosocial: Target Goals: Acknowledge presence or absence of depression, maximize coping skills, provide positive support system. Participant is able to verbalize types and ability to use techniques and skills needed for reducing stress and depression.  Initial Review & Psychosocial Screening:     Initial Psych Review & Screening - 01/02/16 1412      Initial Review   Current issues with History of Depression;Current Psychotropic Meds     Family Dynamics   Good Support System? Yes   Comments Lebert has been married to his wife, Fredy Gladu, for 44 years.  He describes her as an Building services engineer.  They have one  son who is a paramedic and one daughter one is an Therapist, sports.  No grandchildren. Toni does not have a church affiliation, but states his wife does.       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  He has undergone counseling in the past for this reason.  He is currently on Prozac 80 mg once a day and Xanax 0.5 mg in the morning and 0.5 mg at night as needed.        Screening Interventions   Interventions Encouraged to exercise;Program counselor consult      Quality of Life Scores:     Quality of Life - 01/02/16 1521      Quality of Life Scores   Health/Function Pre 19.6 %   Socioeconomic Pre 22.5 %   Psych/Spiritual Pre 22.93 %   Family Pre 27.8 %   GLOBAL Pre 22.1 %      PHQ-9: Recent Review Flowsheet Data    Depression screen [facility] 2/9 01/02/2016   Decreased Interest 0   Down, Depressed, Hopeless 1   PHQ - 2 Score 1   Altered sleeping 2   Tired, decreased energy 1   Change in appetite 0   Feeling bad or failure about yourself  1   Trouble concentrating 1   Moving slowly or fidgety/restless 1   Suicidal thoughts 0   PHQ-9 Score 7   Difficult doing work/chores Somewhat difficult      Psychosocial Evaluation and Intervention:     Psychosocial Evaluation - 02/01/16 0951      Psychosocial Evaluation & Interventions   Comments Follow up with Mr. Nephew reporting he is feeling so much stronger since coming into this program.  He struggled walking from the car to the class and had to stop up to (3) times to catch his breath initially and now he reports walking in without stopping at all.  He states his wife has noticed his renewed energy and stamina and has made positive remarks to him about it.  He reports his mood remains positive and he is continuing to take medication to help with his sleep which continues to be 5-6 hours per night.   Counselor commended Mr. Carlile on his commitment to exercise consistently and his progress made as a result.  Counselor will continue to follow with Mr. B throught the course of this program.        Psychosocial Re-Evaluation:     Psychosocial Re-Evaluation    Row Name 02/03/16 [phone] 02/29/16 0926           Psychosocial Re-Evaluation   Comments Krishon called and said he is sorry that he can't attend Cardiac  Rehab today since he has a nosebleed. Mr. Reidel reports he is glad to be back to Cardiac Rehab as he has been in the hospital (last week) for pneumonia.  He states he is feeling a little weak still at times, but much better than he did prior to hospitalization.  He looks forward to gaining back his stamina and strength that he was experiencing prior to his sickness.  Counselor encouraged Mr. B and commended him for his commitment to exercise and this program.           Vocational Rehabilitation: Provide vocational rehab assistance to qualifying candidates.   Vocational Rehab Evaluation & Intervention:     Vocational Rehab - 01/02/16 1401      Initial Vocational Rehab Evaluation & Intervention   Assessment shows need for Vocational Rehabilitation No      Education: Education Goals: Education classes will be provided on a weekly basis, covering required topics. Participant will state understanding/return demonstration of topics presented.  Learning Barriers/Preferences:     Learning Barriers/Preferences - 01/02/16 1401      Learning Barriers/Preferences   Learning Preferences None      Education Topics: General Nutrition Guidelines/Fats and Fiber: -Group instruction provided by verbal, written material, models and posters to present the general guidelines for heart healthy nutrition. Gives an explanation and review of dietary fats and fiber. Flowsheet Row Cardiac Rehab from 03/21/2016 in [facility] Cardiac and Pulmonary Rehab  Date  02/13/16  Educator  CR  Instruction  Review Code  2- meets goals/outcomes      Controlling Sodium/Reading Food Labels: -Group verbal and written material supporting the discussion of sodium use in heart healthy nutrition. Review and explanation with models, verbal and written materials for utilization of the food label.   Exercise Physiology & Risk Factors: - Group verbal and written instruction with models to review the exercise physiology of the cardiovascular system and associated critical values. Details cardiovascular disease risk factors and the goals associated with each risk factor.   Aerobic Exercise & Resistance Training: - Gives group verbal and written discussion on the health impact of inactivity. On the components of aerobic and resistive training programs and the benefits of this training and how to safely progress through these programs. Flowsheet Row Cardiac Rehab from 03/21/2016 in [facility] Cardiac and Pulmonary Rehab  Date  02/29/16  Educator  [facility]  Instruction Review Code  2- meets goals/outcomes      Flexibility, Balance, General Exercise Guidelines: - Provides group verbal and written instruction on the benefits of flexibility and balance training programs. Provides general exercise guidelines with specific guidelines to those with heart or lung disease. Demonstration and skill practice provided. Flowsheet Row Cardiac Rehab from 03/21/2016 in [facility] Cardiac and Pulmonary Rehab  Date  01/09/16  Educator  [facility]  Instruction Review Code  2- meets goals/outcomes      Stress Management: - Provides group verbal and written instruction about the health risks of elevated stress, cause of high stress, and healthy ways to reduce stress. Flowsheet Row Cardiac Rehab from 03/21/2016 in [facility] Cardiac and Pulmonary Rehab  Date  01/11/16  Educator  [facility]  Instruction Review Code  2- meets goals/outcomes      Depression: - Provides group verbal and written instruction on the correlation between heart/lung disease and  depressed mood, treatment options, and the stigmas associated with seeking treatment.   Anatomy & Physiology of the Heart: - Group verbal and written instruction and models provide basic cardiac anatomy and physiology,  with the coronary electrical and arterial systems. Review of: AMI, Angina, Valve disease, Heart Failure, Cardiac Arrhythmia, Pacemakers, and the ICD. Flowsheet Row Cardiac Rehab from 03/21/2016 in [facility] Cardiac and Pulmonary Rehab  Date  01/16/16  Educator  [facility]  Instruction Review Code  2- meets goals/outcomes      Cardiac Procedures: - Group verbal and written instruction and models to describe the testing methods done to diagnose heart disease. Reviews the outcomes of the test results. Describes the treatment choices: Medical Management, Angioplasty, or Coronary Bypass Surgery. Flowsheet Row Cardiac Rehab from 03/21/2016 in [facility] Cardiac and Pulmonary Rehab  Date  01/23/16  Educator  SB  Instruction Review Code  2- meets goals/outcomes      Cardiac Medications: - Group verbal and written instruction to review commonly prescribed medications for heart disease. Reviews the medication, class of the drug, and side effects. Includes the steps to properly store meds and maintain the prescription regimen. Flowsheet Row Cardiac Rehab from 03/21/2016 in [facility] Cardiac and Pulmonary Rehab  Date  03/21/16 Marisue Humble 2]  Educator  SB  Instruction Review Code  2- meets goals/outcomes      Go Sex-Intimacy & Heart Disease, Get SMART - Goal Setting: - Group verbal and written instruction through game format to discuss heart disease and the return to sexual intimacy. Provides group verbal and written material to discuss and apply goal setting through the application of the S.M.A.R.T. Method. Flowsheet Row Cardiac Rehab from 03/21/2016 in [facility] Cardiac and Pulmonary Rehab  Date  01/23/16  Educator  SB  Instruction Review Code  2- meets goals/outcomes      Other Matters of the Heart: -  Provides group verbal, written materials and models to describe Heart Failure, Angina, Valve Disease, and Diabetes in the realm of heart disease. Includes description of the disease process and treatment options available to the cardiac patient. Flowsheet Row Cardiac Rehab from 03/21/2016 in [facility] Cardiac and Pulmonary Rehab  Date  01/16/16  Educator  SB  Instruction Review Code  2- meets goals/outcomes      Exercise & Equipment Safety: - Individual verbal instruction and demonstration of equipment use and safety with use of the equipment. Flowsheet Row Cardiac Rehab from 03/21/2016 in [facility] Cardiac and Pulmonary Rehab  Date  01/02/16  Educator  D. Joya Gaskins, RN  Instruction Review Code  1- partially meets, needs review/practice      Infection Prevention: - Provides verbal and written material to individual with discussion of infection control including proper hand washing and proper equipment cleaning during exercise session. Flowsheet Row Cardiac Rehab from 03/21/2016 in [facility] Cardiac and Pulmonary Rehab  Date  01/02/16  Educator  D. Joya Gaskins, RN  Instruction Review Code  2- meets goals/outcomes      Falls Prevention: - Provides verbal and written material to individual with discussion of falls prevention and safety. Flowsheet Row Cardiac Rehab from 03/21/2016 in [facility] Cardiac and Pulmonary Rehab  Date  01/02/16  Educator  D. Joya Gaskins, RN  Instruction Review Code  2- meets goals/outcomes      Diabetes: - Individual verbal and written instruction to review signs/symptoms of diabetes, desired ranges of glucose level fasting, after meals and with exercise. Advice that pre and post exercise glucose checks will be done for 3 sessions at entry of program. [place] from 03/21/2016 in [facility] Cardiac and Pulmonary Rehab  Date  01/02/16  Educator  D. Joya Gaskins, RN  Instruction Review Code  2- meets goals/outcomes  Knowledge Questionnaire Score:     Knowledge  Questionnaire Score - 01/02/16 1424      Knowledge Questionnaire Score   Pre Score 20/28      Core Components/Risk Factors/Patient Goals at Admission:     Personal Goals and Risk Factors at Admission - 01/02/16 1408      Core Components/Risk Factors/Patient Goals on Admission    Weight Management Weight Maintenance   Sedentary Yes   Intervention Provide advice, education, support and counseling about physical activity/exercise needs.;Develop an individualized exercise prescription for aerobic and resistive training based on initial evaluation findings, risk stratification, comorbidities and participant's personal goals.   Expected Outcomes Achievement of increased cardiorespiratory fitness and enhanced flexibility, muscular endurance and strength shown through measurements of functional capacity and personal statement of participant.   Increase Strength and Stamina Yes   Intervention Provide advice, education, support and counseling about physical activity/exercise needs.;Develop an individualized exercise prescription for aerobic and resistive training based on initial evaluation findings, risk stratification, comorbidities and participant's personal goals.   Expected Outcomes Achievement of increased cardiorespiratory fitness and enhanced flexibility, muscular endurance and strength shown through measurements of functional capacity and personal statement of participant.   Improve shortness of breath with ADL's Yes   Intervention Provide education, individualized exercise plan and daily activity instruction to help decrease symptoms of SOB with activities of daily living.   Expected Outcomes Short Term: Achieves a reduction of symptoms when performing activities of daily living.   Develop more efficient breathing techniques such as purse lipped breathing and diaphragmatic breathing; and practicing self-pacing with activity Yes   Intervention Provide education, demonstration and support about  specific breathing techniuqes utilized for more efficient breathing. Include techniques such as pursed lipped breathing, diaphragmatic breathing and self-pacing activity.   Expected Outcomes Short Term: Participant will be able to demonstrate and use breathing techniques as needed throughout daily activities.   Diabetes Yes   Intervention Provide education about signs/symptoms and action to take for hypo/hyperglycemia.;Provide education about proper nutrition, including hydration, and aerobic/resistive exercise prescription along with prescribed medications to achieve blood glucose in normal ranges: Fasting glucose 65-99 mg/dL   Expected Outcomes Short Term: Participant verbalizes understanding of the signs/symptoms and immediate care of hyper/hypoglycemia, proper foot care and importance of medication, aerobic/resistive exercise and nutrition plan for blood glucose control.;Long Term: Attainment of HbA1C < 7%.   Heart Failure Yes   Intervention Provide a combined exercise and nutrition program that is supplemented with education, support and counseling about heart failure. Directed toward relieving symptoms such as shortness of breath, decreased exercise tolerance, and extremity edema.   Expected Outcomes Improve functional capacity of life;Short term: Attendance in program 2-3 days a week with increased exercise capacity. Reported lower sodium intake. Reported increased fruit and vegetable intake. Reports medication compliance.;Short term: Daily weights obtained and reported for increase. Utilizing diuretic protocols set by physician.;Long term: Adoption of self-care skills and reduction of barriers for early signs and symptoms recognition and intervention leading to self-care maintenance.   Lipids Yes   Intervention Provide education and support for participant on nutrition & aerobic/resistive exercise along with prescribed medications to achieve LDL <32m, HDL >412m   Expected Outcomes Short Term:  Participant states understanding of desired cholesterol values and is compliant with medications prescribed. Participant is following exercise prescription and nutrition guidelines.;Long Term: Cholesterol controlled with medications as prescribed, with individualized exercise RX and with personalized nutrition plan. Value goals: LDL < 7043mHDL > 40 mg.   Stress Yes  Intervention Offer individual and/or small group education and counseling on adjustment to heart disease, stress management and health-related lifestyle change. Teach and support self-help strategies.;Refer participants experiencing significant psychosocial distress to appropriate mental health specialists for further evaluation and treatment. When possible, include family members and significant others in education/counseling sessions.   Expected Outcomes Short Term: Participant demonstrates changes in health-related behavior, relaxation and other stress management skills, ability to obtain effective social support, and compliance with psychotropic medications if prescribed.;Long Term: Emotional wellbeing is indicated by absence of clinically significant psychosocial distress or social isolation.      Core Components/Risk Factors/Patient Goals Review:      Goals and Risk Factor Review    Row Name 01/06/16 1007 01/18/16 1053 02/13/16 1021 02/15/16 0847 03/26/16 0846     Core Components/Risk Factors/Patient Goals Review   Personal Goals Review Weight Management/Obesity;Sedentary;Increase Strength and Stamina;Develop more efficient breathing techniques such as purse lipped breathing and diaphragmatic breathing and practicing self-pacing with activity.;Improve shortness of breath with ADL's;Heart Failure;Lipids;Diabetes;Hypertension Weight Management/Obesity;Sedentary;Increase Strength and Stamina;Develop more efficient breathing techniques such as purse lipped breathing and diaphragmatic breathing and practicing self-pacing with  activity.;Improve shortness of breath with ADL's;Hypertension;Lipids;Stress;Diabetes;Heart Failure Weight Management/Obesity;Sedentary;Increase Strength and Stamina;Hypertension;Lipids;Develop more efficient breathing techniques such as purse lipped breathing and diaphragmatic breathing and practicing self-pacing with activity.;Improve shortness of breath with ADL's;Stress;Heart Failure Weight Management/Obesity;Sedentary;Increase Strength and Stamina;Heart Failure;Improve shortness of breath with ADL's;Develop more efficient breathing techniques such as purse lipped breathing and diaphragmatic breathing and practicing self-pacing with activity.;Diabetes;Lipids;Hypertension;Stress Sedentary;Weight Management/Obesity;Increase Strength and Stamina;Improve shortness of breath with ADL's;Develop more efficient breathing techniques such as purse lipped breathing and diaphragmatic breathing and practicing self-pacing with activity.;Diabetes;Heart Failure;Lipids;Stress   Review Reviewed goals with Eddie Dibbles today. Edna is off to a good start in rehab.  He is already feeling better and stronger since getting started.  He is doing some light stretches at home on his off days. His blood pressures and blood sugars have been good in rehab.  He has not had any problems with his statins.  His weight is trending down, but it was up today as he had not taken his Lasix yet today.  He was encouraged to watch his weight closely.  He does weigh daily.  His breathing is improving and he is using pursed lip breathing here and at home. Jamarl continues to do well.  He is continuing to get stronger and breathe better overall.  He is doing his walking at home.  His blood pressures and blood sugars and stressors have been good.  He is following his heart failure guidelines and weighing daily.  He has stopped writing it down, but plans to go back to doing that since her can't remember his weights.  His weight was up again today some and he is  planning to take an extra Laisix.  If that doesn't help, he will call the doctor.   Other than today, Ihan is feeling better overall.  He is feeling stronger and breathing better in general. He is also walking at home for 30 min everyday.  His blood sugars and blood pressures have been good.  He weighs daily, watches his salt intake, and monitors himself for symptoms of heart failure.  He has not had any problems taking his mediciations, other than coumadin being adjusted frequently.  He is enjoying his exercise as an outlet. Nazair was admitted last week to be dieuresed.  They took off 12 lbs!  He is feeling much better.  His weight today  was 180.1 lbs.  Greatly improved.  His breathing is better and he is using his PLB.  He is doing well on his statins and numbers were good last week.  His stress is better.  His blood pressure and blood sugars have been good.   Expected Outcomes Demetrus will come to exercise and education classes to work towards his goals. Myan will continue to come to exercise and education classes to work on all of his risk factors.  We will continue to monitor him for progression and improvements. Gailen will continue to come to exercise classes to work on his strength and staimina and weight loss.  We will continue to monitor. Kazuki will continue to come to exercise classes to work on strength and stamina and improved breathing techniques.  We will continue to monitor for progression. Satvik will continue to come to class to work on strength and stamina and breathing.  We will continue to monitor.      Core Components/Risk Factors/Patient Goals at Discharge (Final Review):      Goals and Risk Factor Review - 03/26/16 0846      Core Components/Risk Factors/Patient Goals Review   Personal Goals Review Sedentary;Weight Management/Obesity;Increase Strength and Stamina;Improve shortness of breath with ADL's;Develop more efficient breathing techniques such as purse lipped breathing and  diaphragmatic breathing and practicing self-pacing with activity.;Diabetes;Heart Failure;Lipids;Stress   Review Hari was admitted last week to be dieuresed.  They took off 12 lbs!  He is feeling much better.  His weight today was 180.1 lbs.  Greatly improved.  His breathing is better and he is using his PLB.  He is doing well on his statins and numbers were good last week.  His stress is better.  His blood pressure and blood sugars have been good.   Expected Outcomes Kazi will continue to come to class to work on strength and stamina and breathing.  We will continue to monitor.      ITP Comments:     ITP Comments    Row Name 01/02/16 1444 01/02/16 1501 01/11/16 0742 02/03/16 0845 02/08/16 0624   ITP Comments Grant has LVAD present.  Being admitted to Cardiac Rehab for Chronic Systolic Heart Failure.  Kiron also has COPD and Sleep Apnea.  ECG today with atrial fibrillation, frequent PVCs, and occasional ventricular pacing.  Devante's oxygen saturation dropped to 84% during 6 minute walk, but he recovered quickly.  Winthrop states he has CPAP at home with1 liter of oxygen.  Occasionally he will use oxygen during the day for brief period if he is SOB from an activity.  SOB at intervals during 6 minute walk today.  No other cardiac symptoms reported or observed during orientation/med review.   Shahid has LVAD present.  Being admitted to Cardiac Rehab for Chronic Systolic Heart Failure.  Bricyn also has COPD and Sleep Apnea.  ECG today with atrial fibrillation, frequent PVCs, and occasional ventricular pacing.  Secundino's oxygen saturation dropped to 84% during 6 minute walk, but he recovered quickly.  Krithik states he has CPAP at home with1 liter of oxygen.  Occasionally he will use oxygen during the day for brief period if he is SOB from an activity. Will reach out to Dr. Stann Mainland for an order or script about the possibility of titrating oxygen during cardiac rehab.   SOB at intervals during 6 minute walk today.  No other  cardiac symptoms reported or observed during orientation/med review.   30 day review. Continue with ITP unless changes noted  by Medical Director at [facility] of review. Maxtyn called and said he is sorry that he can't attend Cardiac Rehab today since he has a nosebleed. 30 day review. Continue with ITP unless changes noted by Medical Director at signature of review.   [place] Name 02/10/16 0848 02/15/16 0847 03/07/16 1125 03/19/16 0853     ITP Comments Since he on Coumdin yesterday he rubbed his arm against a bush and it started bleeding. It restarted today even using the treadmill. Pressure applied and applied coban pressure dressing.  Amed was rushing in today. His blood pressure was up to 110 today.  He was not feeling a 100%.  His INR was back up to 3.7 again and his coumadin is being adjusted again.  He tried the treadmill today, but after just 3 minutes he felt wiped out. He thought his oxygen was low, it was 87-89% after resting for a little bit.  He decided to go on home after that but he had completed 35 min of exercise.  He is going to try to rest some, and then hopefully feel better on Friday.  His weight was back down today. 30 day review. Continue with ITP unless changes noted by Medical Director at signature of review. Aikam was readmitted and [facility] to [place] during vacation.  He had missed two doses of his dieuretic.  He was cleared to return to activity.       Comments: Discharge ITP

## 2016-05-13 DIAGNOSIS — J9622 Acute and chronic respiratory failure with hypercapnia: Secondary | ICD-10-CM | POA: Insufficient documentation

## 2016-05-15 DIAGNOSIS — Z862 Personal history of diseases of the blood and blood-forming organs and certain disorders involving the immune mechanism: Secondary | ICD-10-CM | POA: Insufficient documentation

## 2016-05-23 DIAGNOSIS — T829XXA Unspecified complication of cardiac and vascular prosthetic device, implant and graft, initial encounter: Secondary | ICD-10-CM | POA: Insufficient documentation

## 2016-06-01 DIAGNOSIS — E119 Type 2 diabetes mellitus without complications: Secondary | ICD-10-CM | POA: Insufficient documentation

## 2016-08-13 ENCOUNTER — Encounter: Payer: Self-pay | Admitting: Emergency Medicine

## 2016-08-13 ENCOUNTER — Emergency Department: Payer: Medicare HMO

## 2016-08-13 ENCOUNTER — Emergency Department
Admission: EM | Admit: 2016-08-13 | Discharge: 2016-08-13 | Disposition: A | Discharge status: Home / Self Care | Payer: Medicare HMO | Attending: Emergency Medicine | Admitting: Emergency Medicine

## 2016-08-13 DIAGNOSIS — I2510 Atherosclerotic heart disease of native coronary artery without angina pectoris: Secondary | ICD-10-CM | POA: Insufficient documentation

## 2016-08-13 DIAGNOSIS — Y939 Activity, unspecified: Secondary | ICD-10-CM | POA: Diagnosis not present

## 2016-08-13 DIAGNOSIS — Y929 Unspecified place or not applicable: Secondary | ICD-10-CM | POA: Diagnosis not present

## 2016-08-13 DIAGNOSIS — S9031XA Contusion of right foot, initial encounter: Secondary | ICD-10-CM | POA: Insufficient documentation

## 2016-08-13 DIAGNOSIS — Z87891 Personal history of nicotine dependence: Secondary | ICD-10-CM | POA: Diagnosis not present

## 2016-08-13 DIAGNOSIS — I110 Hypertensive heart disease with heart failure: Secondary | ICD-10-CM | POA: Insufficient documentation

## 2016-08-13 DIAGNOSIS — Y999 Unspecified external cause status: Secondary | ICD-10-CM | POA: Insufficient documentation

## 2016-08-13 DIAGNOSIS — I5022 Chronic systolic (congestive) heart failure: Secondary | ICD-10-CM | POA: Diagnosis not present

## 2016-08-13 DIAGNOSIS — Z79899 Other long term (current) drug therapy: Secondary | ICD-10-CM | POA: Insufficient documentation

## 2016-08-13 DIAGNOSIS — E119 Type 2 diabetes mellitus without complications: Secondary | ICD-10-CM | POA: Insufficient documentation

## 2016-08-13 DIAGNOSIS — W208XXA Other cause of strike by thrown, projected or falling object, initial encounter: Secondary | ICD-10-CM | POA: Insufficient documentation

## 2016-08-13 DIAGNOSIS — T148XXA Other injury of unspecified body region, initial encounter: Secondary | ICD-10-CM

## 2016-08-13 DIAGNOSIS — J449 Chronic obstructive pulmonary disease, unspecified: Secondary | ICD-10-CM | POA: Diagnosis not present

## 2016-08-13 DIAGNOSIS — S99921A Unspecified injury of right foot, initial encounter: Secondary | ICD-10-CM | POA: Diagnosis present

## 2016-08-13 NOTE — Discharge Instructions (Signed)
Your exam and x-ray are normal today. There is no evidence of fracture or dislocation on your x-rays. Rest with the foot elevated and apply ice to reduce swelling. Take Tylenol as needed for pain relief. Follow-up with Dr. Elvina Mattes as needed for ongoing symptoms.

## 2016-08-13 NOTE — ED Triage Notes (Signed)
Pt presents from home with foot pain after dropping an oxygen tank on it 2-3 weeks ago while he was in the hospital at [facility]. Pt states he didn't feel much pain until the last few days. Foot is swollen and red. Pt alert & oriented with NAD noted.

## 2016-08-13 NOTE — ED Notes (Signed)
See triage note right foot remains red and swollen  Dropped an O2 tank on foot about 2-3 weeks ago

## 2016-08-13 NOTE — ED Provider Notes (Signed)
[facility] Emergency Department Provider Note ____________________________________________  Time seen: 1438  I have reviewed the triage vital signs and the nursing notes.  HISTORY  Chief Complaint  Foot Pain  HPI William Bray is a 66 y.o. male presents to the ED for evaluation of right foot pain and swelling after accidentally dropping a wheeled oxygen tank on it 2 weeks ago. The patient denies significant pain immediately. He reports that over the last few days, he has noted increased swelling, tightness, and pain. He has ben able to walk on the foot without difficulty.   Past Medical History:  Diagnosis Date  . Arthritis   . Chronic back pain   . Diabetes mellitus without complication ([place])   . Hypertension   . Neuropathy [facility])     Patient Active Problem List   Diagnosis Date Noted  . Hyperlipidemia 01/02/2016  . GI bleed 10/28/2015  . Acute on chronic systolic heart failure ([place]) 10/26/2015  . NSVT (nonsustained ventricular tachycardia) ([place]) 10/23/2015  . 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 HOH (hard of hearing) 06/15/2015  . Patient in clinical research study 04/25/2015  . Lumbar stenosis with neurogenic claudication 03/14/2015  . Anxiety 03/02/2015  . Chronic anticoagulation 10/26/2014  . COPD (chronic obstructive pulmonary disease) ([place]) 09/26/2014  . LVAD (left ventricular assist device) present ([place]) 09/21/2014  . Iron deficiency anemia 07/23/2014  . Chronic systolic CHF (congestive heart failure) ([place]) 02/04/2014  . Encounter for fitting or adjustment of automatic implantable cardioverter-defibrillator 01/04/2014  . DDD (degenerative disc disease), lumbar 12/10/2013  . Lumbar radiculitis 12/10/2013  . Hypertriglyceridemia 10/30/2013  . OSA treated with BiPAP 10/30/2013  . PVC's (premature ventricular contractions) 10/30/2013  . Depression 09/25/2013  . Valvular regurgitation 07/04/2013  . Ischemic cardiomyopathy  04/07/2013  . Presence of drug coated stent in left circumflex coronary artery 02/17/2013  . Chronic low back pain 09/11/2012  . Essential hypertension 09/11/2012  . Automatic implantable cardioverter-defibrillator in situ 07/03/2011  . H/O ventricular fibrillation 03/27/2011  . Paroxysmal atrial fibrillation ([place]) 03/27/2011  . Steatohepatitis 03/05/2011  . CAD (coronary artery disease) 02/20/1990    Past Surgical History:  Procedure Laterality Date  . PAIN PUMP REVISION      Prior to Admission medications   Medication Sig Start Date End Date Taking? Authorizing Provider  albuterol (PROVENTIL) (2.5 MG/3ML) 0.083% nebulizer solution Inhale 3 mLs into the lungs every 6 (six) hours as needed. 12/14/15   Historical Provider, MD  allopurinol (ZYLOPRIM) 100 MG tablet Take 100 mg by mouth daily. 12/05/15   Historical Provider, MD  ALPRAZolam Duanne Moron) 0.5 MG tablet Take 0.5 mg by mouth 2 (two) times daily as needed. ONE IN THE MORNING AND ONE AT BEDTIME PRN 12/08/15   Historical Provider, MD  aspirin EC 81 MG tablet Take 81 mg by mouth daily.    Historical Provider, MD  budesonide (PULMICORT) 0.5 MG/2ML nebulizer solution Inhale 2 mLs into the lungs 2 (two) times daily. 12/14/15   Historical Provider, MD  cetirizine (ZYRTEC) 10 MG tablet Take 10 mg by mouth daily. 06/15/15 06/14/16  Historical Provider, MD  ferrous sulfate 324 (65 Fe) MG TBEC Take 324 mg by mouth every morning. WITH BREAKFAST 10/30/15   Historical Provider, MD  FLUoxetine (PROZAC) 20 MG capsule Take 80 mg by mouth daily. 10/10/15   Historical Provider, MD  gabapentin (NEURONTIN) 100 MG capsule Take 100 mg by mouth daily. 10/10/15   Historical Provider, MD  gabapentin (NEURONTIN) 300 MG capsule Take  300 mg by mouth at bedtime. 09/21/15   Historical Provider, MD  lisinopril (PRINIVIL,ZESTRIL) 2.5 MG tablet Take 2.5 mg by mouth daily. 07/30/15 07/29/16  Historical Provider, MD  metoprolol tartrate (LOPRESSOR) 25 MG tablet Take 25 mg by mouth 2  (two) times daily. 10/30/15 10/29/16  Historical Provider, MD  mirtazapine (REMERON) 30 MG tablet Take 0.5 tablets by mouth at bedtime. 10/18/15   Historical Provider, MD  montelukast (SINGULAIR) 10 MG tablet Take 10 mg by mouth at bedtime. 09/19/15 09/18/16  Historical Provider, MD  ondansetron (ZOFRAN) 4 MG tablet Take 4 mg by mouth every 8 (eight) hours as needed for nausea. 11/24/15   Historical Provider, MD  oxyCODONE (OXY IR/ROXICODONE) 5 MG immediate release tablet Take 1 tablet by mouth 4 (four) times daily as needed. 01/13/16   Historical Provider, MD  pantoprazole (PROTONIX) 40 MG tablet Take 40 mg by mouth daily. 11/24/15 11/23/16  Historical Provider, MD  pravastatin (PRAVACHOL) 40 MG tablet Take 40 mg by mouth at bedtime. 11/24/15 11/23/16  Historical Provider, MD  spironolactone (ALDACTONE) 25 MG tablet Take 25 mg by mouth daily. 03/09/15 03/08/16  Historical Provider, MD  torsemide (DEMADEX) 20 MG tablet Take 2 tablets by mouth daily. 10/30/15 10/29/16  Historical Provider, MD  umeclidinium-vilanterol (ANORO ELLIPTA) 62.5-25 MCG/INH AEPB Inhale 1 puff into the lungs daily. 10/11/15   Historical Provider, MD  warfarin (COUMADIN) 5 MG tablet Take 5 mg by mouth as directed. Taking 5 mg M, W, F and 4 mg T, Th, Sat, Sun. 07/11/15 07/10/16  Historical Provider, MD    Allergies Bee venom and Coreg [carvedilol]  History reviewed. No pertinent family history.  Social History Social History  Substance Use Topics  . Smoking status: Former Smoker    Quit date: 05/28/2005  . Smokeless tobacco: Never Used  . Alcohol use No    Review of Systems  Constitutional: Negative for fever. Cardiovascular: Negative for chest pain. Respiratory: Negative for shortness of breath. Gastrointestinal: Negative for abdominal pain, vomiting and diarrhea. Musculoskeletal: Negative for back pain. Right foot pain & swelling Skin: Negative for rash. Neurological: Negative for headaches, focal weakness or  numbness. ____________________________________________  PHYSICAL EXAM:  VITAL SIGNS: ED Triage Vitals  Enc Vitals Group     BP 08/13/16 1402 106/81     Pulse Rate 08/13/16 1402 79     Resp 08/13/16 1402 (!) 8     Temp 08/13/16 1402 98.9 F (37.2 C)     Temp Source 08/13/16 1402 Oral     SpO2 08/13/16 1402 97 %     Weight 08/13/16 1403 200 lb (90.7 kg)     Height 08/13/16 1403 5' 9"  (1.753 m)     Head Circumference --      Peak Flow --      Pain Score 08/13/16 1403 0     Pain Loc --      Pain Edu? --      Excl. in [place]? --     Constitutional: Alert and oriented. Well appearing and in no distress. Head: Normocephalic and atraumatic. Cardiovascular: Normal rate, regular rhythm. Normal distal pulses. Respiratory: Normal respiratory effort. No wheezes/rales/rhonchi. Musculoskeletal: Right foot with obvious dorsal STS and erythema from the midfoot to the MCPs. Focal area of eechymosis noted distally. Normal foot/ankle ROM and toe flexion.  Nontender with normal range of motion in all extremities.  Neurologic:  Normal speech and language. No gross focal neurologic deficits are appreciated. Skin:  Skin is warm, dry and intact. No  rash noted. ____________________________________________   RADIOLOGY  Right Foot IMPRESSION: No acute fracture or subluxation. Soft tissue swelling dorsal metatarsal and tarsal region. Tiny plantar spur of calcaneus.  I, Nissim Fleischer, Dannielle Karvonen, personally viewed and evaluated these images (plain radiographs) as part of my medical decision making, as well as reviewing the written report by the radiologist. ____________________________________________  INITIAL IMPRESSION / [place] / ED COURSE  Patient with a dorsal right foot contusion and deep bruising, without evidence of fracture or dislocation. He and his wife are reassured by the negative x-rays.  He will be discharged to follow-up with podiatry for ongoing symptoms.   ____________________________________________  FINAL CLINICAL IMPRESSION(S) / ED DIAGNOSES  Final diagnoses:  Contusion of right foot, initial encounter  Hematoma      Melvenia Needles, PA-C 08/13/16 [place], MD 08/13/16 1750

## 2016-08-21 ENCOUNTER — Ambulatory Visit: Payer: Medicare HMO

## 2016-08-28 ENCOUNTER — Encounter: Payer: Medicare HMO | Attending: Specialist | Admitting: Respiratory Therapy

## 2016-08-28 VITALS — Ht 68.75 in | Wt 199.7 lb

## 2016-08-28 DIAGNOSIS — J449 Chronic obstructive pulmonary disease, unspecified: Secondary | ICD-10-CM | POA: Diagnosis present

## 2016-08-28 NOTE — Patient Instructions (Signed)
Patient Instructions  Patient Details  Name: William Bray MRN: [id number] Date of Birth: 15-Nov-1950 Referring Provider:  Erby Pian, MD  Below are the personal goals you chose as well as exercise and nutrition goals. Our goal is to help you keep on track towards obtaining and maintaining your goals. We will be discussing your progress on these goals with you throughout the program.  Initial Exercise Prescription:     Initial Exercise Prescription - 08/28/16 1200      Date of Initial Exercise RX and Referring Provider   Date 08/28/16   Referring Provider Raul Del     Oxygen   Oxygen Continuous   Liters 2     Treadmill   MPH 1.5   Grade 0   Minutes 15  3/3/3   METs 2.15     NuStep   Level 2   Minutes 15   METs 2     Biostep-RELP   Level 2   SPM 50   Minutes 15   METs 2     Prescription Details   Frequency (times per week) 3   Duration Progress to 45 minutes of aerobic exercise without signs/symptoms of physical distress     Intensity   THRR 40-80% of Max Heartrate 103-137   Ratings of Perceived Exertion 11-13   Perceived Dyspnea 0-4     Resistance Training   Training Prescription Yes   Weight 3   Reps 10-15      Exercise Goals: Frequency: Be able to perform aerobic exercise three times per week working toward 3-5 days per week.  Intensity: Work with a perceived exertion of 11 (fairly light) - 15 (hard) as tolerated. Follow your new exercise prescription and watch for changes in prescription as you progress with the program. Changes will be reviewed with you when they are made.  Duration: You should be able to do 30 minutes of continuous aerobic exercise in addition to a 5 minute warm-up and a 5 minute cool-down routine.  Nutrition Goals: Your personal nutrition goals will be established when you do your nutrition analysis with the dietician.  The following are nutrition guidelines to follow: Cholesterol < 228m/day Sodium < 15032mday Fiber:  Men over 50 yrs - 30 grams per day  Personal Goals:     Personal Goals and Risk Factors at Admission - 08/28/16 1208      Core Components/Risk Factors/Patient Goals on Admission    Weight Management Yes;Weight Loss   Intervention Weight Management: Develop a combined nutrition and exercise program designed to reach desired caloric intake, while maintaining appropriate intake of nutrient and fiber, sodium and fats, and appropriate energy expenditure required for the weight goal.;Weight Management: Provide education and appropriate resources to help participant work on and attain dietary goals.;Weight Management/Obesity: Establish reasonable short term and long term weight goals.  Goal: lose 15lbs. Met with dietitian during HT   Expected Outcomes Short Term: Continue to assess and modify interventions until short term weight is achieved;Long Term: Adherence to nutrition and physical activity/exercise program aimed toward attainment of established weight goal;Weight Loss: Understanding of general recommendations for a balanced deficit meal plan, which promotes 1-2 lb weight loss per week and includes a negative energy balance of [phone] kcal/d;Understanding of distribution of calorie intake throughout the day with the consumption of 4-5 meals/snacks;Understanding recommendations for meals to include 15-35% energy as protein, 25-35% energy from fat, 35-60% energy from carbohydrates, less than 20067mf dietary cholesterol, 20-35 gm of total fiber daily  Improve shortness of breath with ADL's Yes   Intervention Provide education, individualized exercise plan and daily activity instruction to help decrease symptoms of SOB with activities of daily living.   Expected Outcomes Short Term: Achieves a reduction of symptoms when performing activities of daily living.   Develop more efficient breathing techniques such as purse lipped breathing and diaphragmatic breathing; and practicing self-pacing with activity  Yes   Intervention Provide education, demonstration and support about specific breathing techniuqes utilized for more efficient breathing. Include techniques such as pursed lipped breathing, diaphragmatic breathing and self-pacing activity.   Expected Outcomes Short Term: Participant will be able to demonstrate and use breathing techniques as needed throughout daily activities.   Increase knowledge of respiratory medications and ability to use respiratory devices properly  Yes  Albuterol, Atrovent, Pulmicort, SVN; ProAir MDI; Uses spacer   Intervention Provide education and demonstration as needed of appropriate use of medications, inhalers, and oxygen therapy.   Expected Outcomes Short Term: Achieves understanding of medications use. Understands that oxygen is a medication prescribed by physician. Demonstrates appropriate use of inhaler and oxygen therapy.   Diabetes Yes   Intervention Provide education about signs/symptoms and action to take for hypo/hyperglycemia.;Provide education about proper nutrition, including hydration, and aerobic/resistive exercise prescription along with prescribed medications to achieve blood glucose in normal ranges: Fasting glucose 65-99 mg/dL   Expected Outcomes Short Term: Participant verbalizes understanding of the signs/symptoms and immediate care of hyper/hypoglycemia, proper foot care and importance of medication, aerobic/resistive exercise and nutrition plan for blood glucose control.;Long Term: Attainment of HbA1C < 7%.   Heart Failure Yes   Intervention Provide a combined exercise and nutrition program that is supplemented with education, support and counseling about heart failure. Directed toward relieving symptoms such as shortness of breath, decreased exercise tolerance, and extremity edema.   Expected Outcomes Improve functional capacity of life;Short term: Attendance in program 2-3 days a week with increased exercise capacity. Reported lower sodium intake.  Reported increased fruit and vegetable intake. Reports medication compliance.;Short term: Daily weights obtained and reported for increase. Utilizing diuretic protocols set by physician.;Long term: Adoption of self-care skills and reduction of barriers for early signs and symptoms recognition and intervention leading to self-care maintenance.   Hypertension Yes   Intervention Provide education on lifestyle modifcations including regular physical activity/exercise, weight management, moderate sodium restriction and increased consumption of fresh fruit, vegetables, and low fat dairy, alcohol moderation, and smoking cessation.;Monitor prescription use compliance.   Expected Outcomes Short Term: Continued assessment and intervention until BP is < 140/7m HG in hypertensive participants. < 130/866mHG in hypertensive participants with diabetes, heart failure or chronic kidney disease.;Long Term: Maintenance of blood pressure at goal levels.   Lipids Yes   Intervention Provide education and support for participant on nutrition & aerobic/resistive exercise along with prescribed medications to achieve LDL <7048mHDL >66m74m Expected Outcomes Short Term: Participant states understanding of desired cholesterol values and is compliant with medications prescribed. Participant is following exercise prescription and nutrition guidelines.;Long Term: Cholesterol controlled with medications as prescribed, with individualized exercise RX and with personalized nutrition plan. Value goals: LDL < 70mg9mL > 40 mg.      Tobacco Use Initial Evaluation: History  Smoking Status   Former Smoker   Quit date: 05/28/2005  Smokeless Tobacco   Never Used    Copy of goals given to participant.

## 2016-08-28 NOTE — Progress Notes (Signed)
Pulmonary Individual Treatment Plan  Patient Details  Name: LEWIN PELLOW MRN: [id number] Date of Birth: 05-23-1951 Referring Provider:     Pulmonary Rehab from 08/28/2016 in [facility] Cardiac and Pulmonary Rehab  Referring Provider  Raul Del      Initial Encounter Date:    Pulmonary Rehab from 08/28/2016 in [facility] Cardiac and Pulmonary Rehab  Date  08/28/16  Referring Provider  Raul Del      Visit Diagnosis: Chronic obstructive pulmonary disease, unspecified COPD type ([place])  Patient's Home Medications on Admission:  Current Outpatient Prescriptions:    albuterol (PROVENTIL) (2.5 MG/3ML) 0.083% nebulizer solution, Inhale 3 mLs into the lungs every 6 (six) hours as needed., Disp: , Rfl:    allopurinol (ZYLOPRIM) 100 MG tablet, Take 100 mg by mouth daily., Disp: , Rfl:    ALPRAZolam (XANAX) 0.5 MG tablet, Take 0.5 mg by mouth 2 (two) times daily as needed. ONE IN THE MORNING AND ONE AT BEDTIME PRN, Disp: , Rfl:    aspirin EC 81 MG tablet, Take 81 mg by mouth daily., Disp: , Rfl:    budesonide (PULMICORT) 0.5 MG/2ML nebulizer solution, Inhale 2 mLs into the lungs 2 (two) times daily., Disp: , Rfl:    cetirizine (ZYRTEC) 10 MG tablet, Take 10 mg by mouth daily., Disp: , Rfl:    ferrous sulfate 324 (65 Fe) MG TBEC, Take 324 mg by mouth every morning. WITH BREAKFAST, Disp: , Rfl:    FLUoxetine (PROZAC) 20 MG capsule, Take 80 mg by mouth daily., Disp: , Rfl:    gabapentin (NEURONTIN) 100 MG capsule, Take 100 mg by mouth daily., Disp: , Rfl:    gabapentin (NEURONTIN) 300 MG capsule, Take 300 mg by mouth at bedtime., Disp: , Rfl:    lisinopril (PRINIVIL,ZESTRIL) 2.5 MG tablet, Take 2.5 mg by mouth daily., Disp: , Rfl:    metoprolol tartrate (LOPRESSOR) 25 MG tablet, Take 25 mg by mouth 2 (two) times daily., Disp: , Rfl:    mirtazapine (REMERON) 30 MG tablet, Take 0.5 tablets by mouth at bedtime., Disp: , Rfl:    montelukast (SINGULAIR) 10 MG tablet, Take 10 mg by mouth at bedtime., Disp: ,  Rfl:    ondansetron (ZOFRAN) 4 MG tablet, Take 4 mg by mouth every 8 (eight) hours as needed for nausea., Disp: , Rfl:    oxyCODONE (OXY IR/ROXICODONE) 5 MG immediate release tablet, Take 1 tablet by mouth 4 (four) times daily as needed., Disp: , Rfl:    pantoprazole (PROTONIX) 40 MG tablet, Take 40 mg by mouth daily., Disp: , Rfl:    pravastatin (PRAVACHOL) 40 MG tablet, Take 40 mg by mouth at bedtime., Disp: , Rfl:    spironolactone (ALDACTONE) 25 MG tablet, Take 25 mg by mouth daily., Disp: , Rfl:    torsemide (DEMADEX) 20 MG tablet, Take 2 tablets by mouth daily., Disp: , Rfl:    umeclidinium-vilanterol (ANORO ELLIPTA) 62.5-25 MCG/INH AEPB, Inhale 1 puff into the lungs daily., Disp: , Rfl:    warfarin (COUMADIN) 5 MG tablet, Take 5 mg by mouth as directed. Taking 5 mg M, W, F and 4 mg T, Th, Sat, Sun., Disp: , Rfl:   Past Medical History: Past Medical History:  Diagnosis Date   Arthritis    Chronic back pain    Diabetes mellitus without complication ([place])    Hypertension    Neuropathy ([place])     Tobacco Use: History  Smoking Status   Former Smoker   Quit date: 05/28/2005  Smokeless Tobacco  Never Used    Labs: Recent Review Flowsheet Data    Labs for ITP Cardiac and Pulmonary Rehab Latest Ref Rng & Units 08/08/2012 04/28/2013 11/05/2013 05/30/2014 06/02/2014   Cholestrol 0 - 200 mg/dL 105 - 127 64 -   LDLCALC 0 - 100 mg/dL 37 - 74 27 -   HDL 40 - 60 mg/dL 33(L) - 33(L) 15(L) -   Trlycerides 0 - 200 mg/dL 173 - 99 108 -   Hemoglobin A1c 4.2 - 6.3 % - 5.9 - - 5.3       ADL UCSD:     Pulmonary Assessment Scores    Row Name 08/28/16 1201         ADL UCSD   ADL Phase Entry     SOB Score total 71     Rest 1     Walk 2     Stairs 5     Bath 2     Dress 3     Shop 3       mMRC Score   mMRC Score 1        Pulmonary Function Assessment:     Pulmonary Function Assessment - 08/28/16 1158      Initial Spirometry Results   FVC% 54 %   FEV1% 29 %    FEV1/FVC Ratio 39.67     Post Bronchodilator Spirometry Results   FVC% 64 %   FEV1% 37 %   FEV1/FVC Ratio 38.57     Breath   Bilateral Breath Sounds Decreased;Wheezes;Rales   Shortness of Breath Yes;Limiting activity;Fear of Shortness of Breath      Exercise Target Goals: Date: 08/28/16  Exercise Program Goal: Individual exercise prescription set with THRR, safety & activity barriers. Participant demonstrates ability to understand and report RPE using BORG scale, to self-measure pulse accurately, and to acknowledge the importance of the exercise prescription.  Exercise Prescription Goal: Starting with aerobic activity 30 plus minutes a day, 3 days per week for initial exercise prescription. Provide home exercise prescription and guidelines that participant acknowledges understanding prior to discharge.  Activity Barriers & Risk Stratification:   6 Minute Walk:     6 Minute Walk    Row Name 03/26/16 0903 08/28/16 1154       6 Minute Walk   Phase Discharge Initial    Distance 1120 feet 980 feet    Distance % Change 48.7 %  367 ft  --    Walk Time 6 minutes 6 minutes    # of Rest Breaks 0 1    MPH 2.12 1.92    METS 2.82 2.48    RPE 13 13    Perceived Dyspnea  3 1    VO2 Peak 9.85 8.68    Symptoms No No    Resting HR 90 bpm 70 bpm    Resting BP --  94 dopplar 102/0    Max Ex. HR 111 bpm 107 bpm    Max Ex. BP --  98 dopplar 126/0    2 Minute Post BP  -- 104/0      Interval HR   Baseline HR  -- 70    1 Minute HR  -- 97    2 Minute HR  -- 97    3 Minute HR  -- 89    4 Minute HR  -- 92    6 Minute HR  -- 107    Interval Heart Rate?  -- Yes      Interval Oxygen  Interval Oxygen?  -- Yes    Baseline Oxygen Saturation % 95 % 95 %    Baseline Liters of Oxygen 0 L 2 L    1 Minute Oxygen Saturation %  -- 96 %    1 Minute Liters of Oxygen  -- 2 L    2 Minute Oxygen Saturation %  -- 96 %    2 Minute Liters of Oxygen  -- 2 L    3 Minute Oxygen Saturation %  -- 95  %    3 Minute Liters of Oxygen  -- 2 L    4 Minute Oxygen Saturation %  -- 95 %    4 Minute Liters of Oxygen  -- 2 L    5 Minute Liters of Oxygen  -- 2 L    6 Minute Oxygen Saturation %  -- 95 %    6 Minute Liters of Oxygen  -- 2 L    2 Minute Post Oxygen Saturation %  -- 98 %    2 Minute Post Liters of Oxygen  -- 2 L      Oxygen Initial Assessment:     Oxygen Initial Assessment - 08/28/16 1252      Home Oxygen   Home Oxygen Device Portable Concentrator;E-Tanks   Sleep Oxygen Prescription Continuous   Liters per minute 2   Home Exercise Oxygen Prescription Continuous   Liters per minute 2   Home at Rest Exercise Oxygen Prescription Continuous   Liters per minute 2   Compliance with Home Oxygen Use Yes     Initial 6 min Walk   Oxygen Used Continuous;E-Tanks   Liters per minute 2   Resting Oxygen Saturation  during 6 min walk 95 %   Exercise Oxygen Saturation  during 6 min walk 95 %     Program Oxygen Prescription   Program Oxygen Prescription Continuous;E-Tanks   Liters per minute 2     Intervention   Short Term Goals To learn and exhibit compliance with exercise, home and travel O2 prescription;To learn and understand importance of monitoring SPO2 with pulse oximeter and demonstrate accurate use of the pulse oximeter.;To Learn and understand importance of maintaining oxygen saturations>88%;To learn and demonstrate proper purse lipped breathing techniques or other breathing techniques.;To learn and demonstrate proper use of respiratory medications   Long  Term Goals Exhibits compliance with exercise, home and travel O2 prescription;Verbalizes importance of monitoring SPO2 with pulse oximeter and return demonstration;Maintenance of O2 saturations>88%;Exhibits proper breathing techniques, such as purse lipped breathing or other method taught during program session;Compliance with respiratory medication;Demonstrates proper use of MDIs      Oxygen Re-Evaluation:   Oxygen  Discharge (Final Oxygen Re-Evaluation):   Initial Exercise Prescription:     Initial Exercise Prescription - 08/28/16 1200      Date of Initial Exercise RX and Referring Provider   Date 08/28/16   Referring Provider Raul Del     Oxygen   Oxygen Continuous   Liters 2     Treadmill   MPH 1.5   Grade 0   Minutes 15  3/3/3   METs 2.15     NuStep   Level 2   Minutes 15   METs 2     Biostep-RELP   Level 2   SPM 50   Minutes 15   METs 2     Prescription Details   Frequency (times per week) 3   Duration Progress to 45 minutes of aerobic exercise without signs/symptoms of physical distress  Intensity   THRR 40-80% of Max Heartrate 103-137   Ratings of Perceived Exertion 11-13   Perceived Dyspnea 0-4     Resistance Training   Training Prescription Yes   Weight 3   Reps 10-15      Perform Capillary Blood Glucose checks as needed.  Exercise Prescription Changes:     Exercise Prescription Changes    Row Name 03/14/16 1500 03/28/16 1500           Response to Exercise   Blood Pressure (Admit) --  96 dopplar --  104 dopplar      Blood Pressure (Exercise) --  94 dopplar --  82 dopplar      Blood Pressure (Exit) --  96 dopplar --  86 dopplar      Heart Rate (Admit) 52 bpm 93 bpm      Heart Rate (Exercise) 117 bpm 119 bpm      Heart Rate (Exit) 94 bpm 86 bpm      Rating of Perceived Exertion (Exercise) 13 15      Symptoms none none      Comments Home Exercise Guidelines given 01/20/16 Home Exercise Guidelines given 01/20/16      Duration Progress to 45 minutes of aerobic exercise without signs/symptoms of physical distress Progress to 45 minutes of aerobic exercise without signs/symptoms of physical distress      Intensity THRR unchanged THRR unchanged        Progression   Progression Continue to progress workloads to maintain intensity without signs/symptoms of physical distress. Continue to progress workloads to maintain intensity without signs/symptoms  of physical distress.      Average METs 2.8 2.4        Resistance Training   Training Prescription Yes Yes      Weight 4 lbs 4 lbs      Reps 10-15 10-15        Interval Training   Interval Training No No        Treadmill   MPH 2.2 2.2      Grade 1 1      Minutes 15 15      METs 2.99 2.99        NuStep   Level 4 4      Minutes 15 15      METs 2.3 2.2        REL-XR   Level 4 4      Minutes 15 15      METs 3.1 2        Home Exercise Plan   Plans to continue exercise at Home  walking Home  walking      Frequency Add 2 additional days to program exercise sessions. Add 2 additional days to program exercise sessions.        Exercise Review   Progression Yes Yes         Exercise Comments:     Exercise Comments    Row Name 03/09/16 1012 03/14/16 1506 03/21/16 0809 03/26/16 0850 03/28/16 1556   Exercise Comments Reviewed METs average and discussed progression with pt today. Massimiliano continues to do well with exercise.   He has gotten up to 3.1 METs on the XR.  We will continue to monitor his progression. Pauls O2 sats were 87 so he is using 1 L during education and 2L during exercise. Vaun was admitted last Wednesday for fluid control.  They took off 12 lbs.  He is feeling much better and SaO2 was  95% today.  He completed his post 6 min walk today. Niklaus continues to do well with exercise.  He works hard when he is here.  He will be graduating soon.  We will continue to montior his progress.   Bud Name 03/30/16 1033 04/02/16 0911         Exercise Comments Reviewed METs average and discussed progression with pt today.  Christophr graduated today from cardiac rehab with 36 sessions completed.  Details of the patient's exercise prescription and what He needs to do in order to continue the prescription and progress were discussed with patient.  Patient was given a copy of prescription and goals.  Patient verbalized understanding.  Delmus plans to continue to exercise by coming to [employer].          Exercise Goals and Review:     Exercise Goals    Row Name 08/28/16 1153             Exercise Goals   Increase Physical Activity Yes       Intervention Provide advice, education, support and counseling about physical activity/exercise needs.;Develop an individualized exercise prescription for aerobic and resistive training based on initial evaluation findings, risk stratification, comorbidities and participant's personal goals.       Expected Outcomes Achievement of increased cardiorespiratory fitness and enhanced flexibility, muscular endurance and strength shown through measurements of functional capacity and personal statement of participant.       Increase Strength and Stamina Yes       Intervention Provide advice, education, support and counseling about physical activity/exercise needs.;Develop an individualized exercise prescription for aerobic and resistive training based on initial evaluation findings, risk stratification, comorbidities and participant's personal goals.       Expected Outcomes Achievement of increased cardiorespiratory fitness and enhanced flexibility, muscular endurance and strength shown through measurements of functional capacity and personal statement of participant.          Exercise Goals Re-Evaluation :   Discharge Exercise Prescription (Final Exercise Prescription Changes):     Exercise Prescription Changes - 03/28/16 1500      Response to Exercise   Blood Pressure (Admit) --  104 dopplar   Blood Pressure (Exercise) --  82 dopplar   Blood Pressure (Exit) --  86 dopplar   Heart Rate (Admit) 93 bpm   Heart Rate (Exercise) 119 bpm   Heart Rate (Exit) 86 bpm   Rating of Perceived Exertion (Exercise) 15   Symptoms none   Comments Home Exercise Guidelines given 01/20/16   Duration Progress to 45 minutes of aerobic exercise without signs/symptoms of physical distress   Intensity THRR unchanged     Progression   Progression Continue to  progress workloads to maintain intensity without signs/symptoms of physical distress.   Average METs 2.4     Resistance Training   Training Prescription Yes   Weight 4 lbs   Reps 10-15     Interval Training   Interval Training No     Treadmill   MPH 2.2   Grade 1   Minutes 15   METs 2.99     NuStep   Level 4   Minutes 15   METs 2.2     REL-XR   Level 4   Minutes 15   METs 2     Home Exercise Plan   Plans to continue exercise at Home  walking   Frequency Add 2 additional days to program exercise sessions.     Exercise Review  Progression Yes      Nutrition:  Target Goals: Understanding of nutrition guidelines, daily intake of sodium <1544m, cholesterol <2032m calories 30% from fat and 7% or less from saturated fats, daily to have 5 or more servings of fruits and vegetables.  Biometrics:     Pre Biometrics - 08/28/16 1153      Pre Biometrics   Height 5' 8.75" (1.746 m)   Weight 199 lb 11.2 oz (90.6 kg)   Waist Circumference 42.75 inches   Hip Circumference 45.25 inches   Waist to Hip Ratio 0.94 %   BMI (Calculated) 29.8       Nutrition Therapy Plan and Nutrition Goals:   Nutrition Discharge: Rate Your Plate Scores:   Nutrition Goals Re-Evaluation:   Nutrition Goals Discharge (Final Nutrition Goals Re-Evaluation):   Psychosocial: Target Goals: Acknowledge presence or absence of significant depression and/or stress, maximize coping skills, provide positive support system. Participant is able to verbalize types and ability to use techniques and skills needed for reducing stress and depression.   Initial Review & Psychosocial Screening:     Initial Psych Review & Screening - 08/28/16 1213      Family Dynamics   Good Support System? Yes   Comments Mr BuBellosoas great support from his wife, daughter, and son. He has a positive  attitude, wants to do more activity, and is looking forward to participating in [place]    Barriers    Psychosocial barriers to participate in program There are no identifiable barriers or psychosocial needs.;The patient should benefit from training in stress management and relaxation.     Screening Interventions   Interventions Encouraged to exercise      Quality of Life Scores:     Quality of Life - 08/28/16 1215      Quality of Life Scores   Health/Function Pre 20.63 %   Socioeconomic Pre 20.67 %   Psych/Spiritual Pre 20.86 %   Family Pre 21 %   GLOBAL Pre 20.74 %      PHQ-9: Recent Review Flowsheet Data    [facility] 08/28/2016 01/02/2016   Decreased Interest 1 0   Down, Depressed, Hopeless 1 1   PHQ - 2 Score 2 1   Altered sleeping 1 2   Tired, decreased energy 1 1   Change in appetite 1 0   Feeling bad or failure about yourself  1 1   Trouble concentrating 1 1   Moving slowly or fidgety/restless 1 1   Suicidal thoughts 0 0   PHQ-9 Score 8 7   Difficult doing work/chores Not difficult at all Somewhat difficult     Interpretation of Total Score  Total Score Depression Severity:  1-4 = Minimal depression, 5-9 = Mild depression, 10-14 = Moderate depression, 15-19 = Moderately severe depression, 20-27 = Severe depression   Psychosocial Evaluation and Intervention:   Psychosocial Re-Evaluation:   Psychosocial Discharge (Final Psychosocial Re-Evaluation):   Education: Education Goals: Education classes will be provided on a weekly basis, covering required topics. Participant will state understanding/return demonstration of topics presented.  Learning Barriers/Preferences:     Learning Barriers/Preferences - 08/28/16 1158      Learning Barriers/Preferences   Learning Barriers None   Learning Preferences None      Education Topics: Initial Evaluation Education: - Verbal, written and demonstration of respiratory meds, RPE/PD scales, oximetry and breathing techniques. Instruction on use of nebulizers and MDIs: cleaning and proper use, rinsing  mouth with steroid doses and  importance of monitoring MDI activations.   Pulmonary Rehab from 08/28/2016 in [facility] Cardiac and Pulmonary Rehab  Date  08/28/16  Educator  LB  Instruction Review Code  2- meets goals/outcomes      General Nutrition Guidelines/Fats and Fiber: -Group instruction provided by verbal, written material, models and posters to present the general guidelines for heart healthy nutrition. Gives an explanation and review of dietary fats and fiber.   Cardiac Rehab from 03/21/2016 in [facility] Cardiac and Pulmonary Rehab  Date  02/13/16  Educator  CR  Instruction Review Code  2- meets goals/outcomes      Controlling Sodium/Reading Food Labels: -Group verbal and written material supporting the discussion of sodium use in heart healthy nutrition. Review and explanation with models, verbal and written materials for utilization of the food label.   Exercise Physiology & Risk Factors: - Group verbal and written instruction with models to review the exercise physiology of the cardiovascular system and associated critical values. Details cardiovascular disease risk factors and the goals associated with each risk factor.   Aerobic Exercise & Resistance Training: - Gives group verbal and written discussion on the health impact of inactivity. On the components of aerobic and resistive training programs and the benefits of this training and how to safely progress through these programs.   Cardiac Rehab from 03/21/2016 in [facility] Cardiac and Pulmonary Rehab  Date  02/29/16  Educator  [facility]  Instruction Review Code  2- meets goals/outcomes      Flexibility, Balance, General Exercise Guidelines: - Provides group verbal and written instruction on the benefits of flexibility and balance training programs. Provides general exercise guidelines with specific guidelines to those with heart or lung disease. Demonstration and skill practice provided.   Cardiac Rehab from 03/21/2016 in [facility] Cardiac  and Pulmonary Rehab  Date  01/09/16  Educator  [facility]  Instruction Review Code  2- meets goals/outcomes      Stress Management: - Provides group verbal and written instruction about the health risks of elevated stress, cause of high stress, and healthy ways to reduce stress.   Cardiac Rehab from 03/21/2016 in [facility] Cardiac and Pulmonary Rehab  Date  01/11/16  Educator  [facility]  Instruction Review Code  2- meets goals/outcomes      Depression: - Provides group verbal and written instruction on the correlation between heart/lung disease and depressed mood, treatment options, and the stigmas associated with seeking treatment.   Exercise & Equipment Safety: - Individual verbal instruction and demonstration of equipment use and safety with use of the equipment.   Cardiac Rehab from 03/21/2016 in [facility] Cardiac and Pulmonary Rehab  Date  01/02/16  Educator  D. Joya Gaskins, RN  Instruction Review Code  1- partially meets, needs review/practice      Infection Prevention: - Provides verbal and written material to individual with discussion of infection control including proper hand washing and proper equipment cleaning during exercise session.   Cardiac Rehab from 03/21/2016 in [facility] Cardiac and Pulmonary Rehab  Date  01/02/16  Educator  D. Joya Gaskins, RN  Instruction Review Code  2- meets goals/outcomes      Falls Prevention: - Provides verbal and written material to individual with discussion of falls prevention and safety.   Cardiac Rehab from 03/21/2016 in [facility] Cardiac and Pulmonary Rehab  Date  01/02/16  Educator  D. Joya Gaskins, RN  Instruction Review Code  2- meets goals/outcomes      Diabetes: - Individual verbal and written instruction to review signs/symptoms of diabetes, desired  ranges of glucose level fasting, after meals and with exercise. Advice that pre and post exercise glucose checks will be done for 3 sessions at entry of program.   Cardiac Rehab from 03/21/2016 in [facility] Cardiac and  Pulmonary Rehab  Date  01/02/16  Educator  D. Joya Gaskins, RN  Instruction Review Code  2- meets goals/outcomes      Chronic Lung Diseases: - Group verbal and written instruction to review new updates, new respiratory medications, new advancements in procedures and treatments. Provide informative websites and "800" numbers of self-education.   Lung Procedures: - Group verbal and written instruction to describe testing methods done to diagnose lung disease. Review the outcome of test results. Describe the treatment choices: Pulmonary Function Tests, ABGs and oximetry.   Energy Conservation: - Provide group verbal and written instruction for methods to conserve energy, plan and organize activities. Instruct on pacing techniques, use of adaptive equipment and posture/positioning to relieve shortness of breath.   Triggers: - Group verbal and written instruction to review types of environmental controls: home humidity, furnaces, filters, dust mite/pet prevention, HEPA vacuums. To discuss weather changes, air quality and the benefits of nasal washing.   Exacerbations: - Group verbal and written instruction to provide: warning signs, infection symptoms, calling MD promptly, preventive modes, and value of vaccinations. Review: effective airway clearance, coughing and/or vibration techniques. Create an Sports administrator.   Oxygen: - Individual and group verbal and written instruction on oxygen therapy. Includes supplement oxygen, available portable oxygen systems, continuous and intermittent flow rates, oxygen safety, concentrators, and Medicare reimbursement for oxygen.   Pulmonary Rehab from 08/28/2016 in [facility] Cardiac and Pulmonary Rehab  Date  08/28/16  Educator  LB  Instruction Review Code  2- meets goals/outcomes      Respiratory Medications: - Group verbal and written instruction to review medications for lung disease. Drug class, frequency, complications, importance of spacers, rinsing mouth  after steroid MDI's, and proper cleaning methods for nebulizers.   Pulmonary Rehab from 08/28/2016 in [facility] Cardiac and Pulmonary Rehab  Date  08/28/16  Educator  LB  Instruction Review Code  2- meets goals/outcomes      AED/CPR: - Group verbal and written instruction with the use of models to demonstrate the basic use of the AED with the basic ABC's of resuscitation.   Breathing Retraining: - Provides individuals verbal and written instruction on purpose, frequency, and proper technique of diaphragmatic breathing and pursed-lipped breathing. Applies individual practice skills.   Pulmonary Rehab from 08/28/2016 in [facility] Cardiac and Pulmonary Rehab  Date  08/28/16  Educator  LB  Instruction Review Code  2- meets goals/outcomes      Anatomy and Physiology of the Lungs: - Group verbal and written instruction with the use of models to provide basic lung anatomy and physiology related to function, structure and complications of lung disease.   Heart Failure: - Group verbal and written instruction on the basics of heart failure: signs/symptoms, treatments, explanation of ejection fraction, enlarged heart and cardiomyopathy.   Sleep Apnea: - Individual verbal and written instruction to review Obstructive Sleep Apnea. Review of risk factors, methods for diagnosing and types of masks and machines for OSA.   Anxiety: - Provides group, verbal and written instruction on the correlation between heart/lung disease and anxiety, treatment options, and management of anxiety.   Relaxation: - Provides group, verbal and written instruction about the benefits of relaxation for patients with heart/lung disease. Also provides patients with examples of relaxation techniques.   Knowledge Questionnaire  Score:     Knowledge Questionnaire Score - 08/28/16 1158      Knowledge Questionnaire Score   Pre Score 8/10       Core Components/Risk Factors/Patient Goals at Admission:     Personal Goals and  Risk Factors at Admission - 08/28/16 1208      Core Components/Risk Factors/Patient Goals on Admission    Weight Management Yes;Weight Loss   Intervention Weight Management: Develop a combined nutrition and exercise program designed to reach desired caloric intake, while maintaining appropriate intake of nutrient and fiber, sodium and fats, and appropriate energy expenditure required for the weight goal.;Weight Management: Provide education and appropriate resources to help participant work on and attain dietary goals.;Weight Management/Obesity: Establish reasonable short term and long term weight goals.  Goal: lose 15lbs. Met with dietitian during HT   Expected Outcomes Short Term: Continue to assess and modify interventions until short term weight is achieved;Long Term: Adherence to nutrition and physical activity/exercise program aimed toward attainment of established weight goal;Weight Loss: Understanding of general recommendations for a balanced deficit meal plan, which promotes 1-2 lb weight loss per week and includes a negative energy balance of [id number] kcal/d;Understanding of distribution of calorie intake throughout the day with the consumption of 4-5 meals/snacks;Understanding recommendations for meals to include 15-35% energy as protein, 25-35% energy from fat, 35-60% energy from carbohydrates, less than 238m of dietary cholesterol, 20-35 gm of total fiber daily   Improve shortness of breath with ADL's Yes   Intervention Provide education, individualized exercise plan and daily activity instruction to help decrease symptoms of SOB with activities of daily living.   Expected Outcomes Short Term: Achieves a reduction of symptoms when performing activities of daily living.   Develop more efficient breathing techniques such as purse lipped breathing and diaphragmatic breathing; and practicing self-pacing with activity Yes   Intervention Provide education, demonstration and support about specific  breathing techniuqes utilized for more efficient breathing. Include techniques such as pursed lipped breathing, diaphragmatic breathing and self-pacing activity.   Expected Outcomes Short Term: Participant will be able to demonstrate and use breathing techniques as needed throughout daily activities.   Increase knowledge of respiratory medications and ability to use respiratory devices properly  Yes  Albuterol, Atrovent, Pulmicort, SVN; ProAir MDI; Uses spacer   Intervention Provide education and demonstration as needed of appropriate use of medications, inhalers, and oxygen therapy.   Expected Outcomes Short Term: Achieves understanding of medications use. Understands that oxygen is a medication prescribed by physician. Demonstrates appropriate use of inhaler and oxygen therapy.   Diabetes Yes   Intervention Provide education about signs/symptoms and action to take for hypo/hyperglycemia.;Provide education about proper nutrition, including hydration, and aerobic/resistive exercise prescription along with prescribed medications to achieve blood glucose in normal ranges: Fasting glucose 65-99 mg/dL   Expected Outcomes Short Term: Participant verbalizes understanding of the signs/symptoms and immediate care of hyper/hypoglycemia, proper foot care and importance of medication, aerobic/resistive exercise and nutrition plan for blood glucose control.;Long Term: Attainment of HbA1C < 7%.   Heart Failure Yes   Intervention Provide a combined exercise and nutrition program that is supplemented with education, support and counseling about heart failure. Directed toward relieving symptoms such as shortness of breath, decreased exercise tolerance, and extremity edema.   Expected Outcomes Improve functional capacity of life;Short term: Attendance in program 2-3 days a week with increased exercise capacity. Reported lower sodium intake. Reported increased fruit and vegetable intake. Reports medication  compliance.;Short term: Daily weights obtained and reported  for increase. Utilizing diuretic protocols set by physician.;Long term: Adoption of self-care skills and reduction of barriers for early signs and symptoms recognition and intervention leading to self-care maintenance.   Hypertension Yes   Intervention Provide education on lifestyle modifcations including regular physical activity/exercise, weight management, moderate sodium restriction and increased consumption of fresh fruit, vegetables, and low fat dairy, alcohol moderation, and smoking cessation.;Monitor prescription use compliance.   Expected Outcomes Short Term: Continued assessment and intervention until BP is < 140/31m HG in hypertensive participants. < 130/873mHG in hypertensive participants with diabetes, heart failure or chronic kidney disease.;Long Term: Maintenance of blood pressure at goal levels.   Lipids Yes   Intervention Provide education and support for participant on nutrition & aerobic/resistive exercise along with prescribed medications to achieve LDL <7025mHDL >68m57m Expected Outcomes Short Term: Participant states understanding of desired cholesterol values and is compliant with medications prescribed. Participant is following exercise prescription and nutrition guidelines.;Long Term: Cholesterol controlled with medications as prescribed, with individualized exercise RX and with personalized nutrition plan. Value goals: LDL < 70mg20mL > 40 mg.      Core Components/Risk Factors/Patient Goals Review:      Goals and Risk Factor Review    Row Name 03/26/16 0846 [id number]        Core Components/Risk Factors/Patient Goals Review   Personal Goals Review Sedentary;Weight Management/Obesity;Increase Strength and Stamina;Improve shortness of breath with ADL's;Develop more efficient breathing techniques such as purse lipped breathing and diaphragmatic breathing and practicing self-pacing with activity.;Diabetes;Heart  Failure;Lipids;Stress       Review Jahmeer Chrishaunadmitted last week to be dieuresed.  They took off 12 lbs!  He is feeling much better.  His weight today was 180.1 lbs.  Greatly improved.  His breathing is better and he is using his PLB.  He is doing well on his statins and numbers were good last week.  His stress is better.  His blood pressure and blood sugars have been good.       Expected Outcomes Aquilla Devarious continue to come to class to work on strength and stamina and breathing.  We will continue to monitor.          Core Components/Risk Factors/Patient Goals at Discharge (Final Review):      Goals and Risk Factor Review - 03/26/16 0846      Core Components/Risk Factors/Patient Goals Review   Personal Goals Review Sedentary;Weight Management/Obesity;Increase Strength and Stamina;Improve shortness of breath with ADL's;Develop more efficient breathing techniques such as purse lipped breathing and diaphragmatic breathing and practicing self-pacing with activity.;Diabetes;Heart Failure;Lipids;Stress   Review Norlan Muradadmitted last week to be dieuresed.  They took off 12 lbs!  He is feeling much better.  His weight today was 180.1 lbs.  Greatly improved.  His breathing is better and he is using his PLB.  He is doing well on his statins and numbers were good last week.  His stress is better.  His blood pressure and blood sugars have been good.   Expected Outcomes Avonte Glendel continue to come to class to work on strength and stamina and breathing.  We will continue to monitor.      ITP Comments:     ITP Comments    Row Name 03/07/16 1125 03/19/16 0853         ITP Comments 30 day review. Continue with ITP unless changes noted by Medical Director at signature of review. Kyjuan Kalumreadmitted and Life Flighted to  [facility] during vacation.  He had missed two doses of his dieuretic.  He was cleared to return to activity.         Comments: Mr Denomme plans to start [employer] [employer] on 09/03/16 and attend 3  days/week.

## 2016-09-03 ENCOUNTER — Encounter: Payer: Medicare HMO | Admitting: *Deleted

## 2016-09-03 DIAGNOSIS — J449 Chronic obstructive pulmonary disease, unspecified: Secondary | ICD-10-CM | POA: Diagnosis not present

## 2016-09-03 DIAGNOSIS — I5022 Chronic systolic (congestive) heart failure: Secondary | ICD-10-CM

## 2016-09-03 DIAGNOSIS — Z95811 Presence of heart assist device: Secondary | ICD-10-CM

## 2016-09-03 LAB — GLUCOSE, CAPILLARY
GLUCOSE-CAPILLARY: 231 mg/dL — AB (ref 65–99)
GLUCOSE-CAPILLARY: 122 mg/dL — AB (ref 65–99)

## 2016-09-03 NOTE — Progress Notes (Signed)
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 ONE IN THE MORNING AND ONE AT BEDTIME PRN, Disp: , Rfl:    aspirin EC 81 MG tablet, Take 81 mg by mouth daily., Disp: , Rfl:    budesonide (PULMICORT) 0.5 MG/2ML nebulizer solution, Inhale 2 mLs into the lungs 2 (two) times daily., Disp: , Rfl:    cetirizine (ZYRTEC) 10 MG tablet, Take 10 mg by mouth daily., Disp: , Rfl:    ferrous sulfate 324 (65 Fe) MG TBEC, Take 324 mg by mouth every morning. WITH BREAKFAST, Disp: , Rfl:    FLUoxetine (PROZAC) 20 MG capsule, Take 80 mg by mouth daily., Disp: , Rfl:    gabapentin (NEURONTIN) 100 MG capsule, Take 100 mg by mouth daily., Disp: , Rfl:    gabapentin (NEURONTIN) 300 MG capsule, Take 300 mg by mouth at bedtime., Disp: , Rfl:    lisinopril (PRINIVIL,ZESTRIL) 2.5 MG tablet, Take 2.5 mg by mouth daily., Disp: , Rfl:    metoprolol tartrate (LOPRESSOR) 25 MG tablet, Take 25 mg by mouth 2 (two) times daily., Disp: , Rfl:    mirtazapine (REMERON) 30 MG tablet, Take 0.5 tablets by mouth at  bedtime., Disp: , Rfl:    montelukast (SINGULAIR) 10 MG tablet, Take 10 mg by mouth at bedtime., Disp: , Rfl:    ondansetron (ZOFRAN) 4 MG tablet, Take 4 mg by mouth every 8 (eight) hours as needed for nausea., Disp: , Rfl:    oxyCODONE (OXY IR/ROXICODONE) 5 MG immediate release tablet, Take 1 tablet by mouth 4 (four) times daily as needed., Disp: , Rfl:    pantoprazole (PROTONIX) 40 MG tablet, Take 40 mg by mouth daily., Disp: , Rfl:    pravastatin (PRAVACHOL) 40 MG tablet, Take 40 mg by mouth at bedtime., Disp: , Rfl:    spironolactone (ALDACTONE) 25 MG tablet, Take 25 mg by mouth daily., Disp: , Rfl:    torsemide (DEMADEX) 20 MG tablet, Take 2 tablets by mouth daily., Disp: , Rfl:    umeclidinium-vilanterol (ANORO ELLIPTA) 62.5-25 MCG/INH AEPB, Inhale 1 puff into the lungs daily., Disp: , Rfl:    warfarin (COUMADIN) 5 MG tablet, Take 5 mg by mouth as directed. Taking 5 mg M, W, F and 4 mg T, Th, Sat, Sun., Disp: , Rfl:   Past Medical History: Past Medical History:  Diagnosis Date   Arthritis    Chronic back pain    Diabetes mellitus without complication ([place])    Hypertension    Neuropathy ([place])     Tobacco Use: History  Smoking Status   Former Smoker   Quit date: 05/28/2005  Smokeless Tobacco   Never Used    Labs: Recent Review Flowsheet Data    Labs for ITP Cardiac and Pulmonary Rehab Latest Ref Rng & Units 08/08/2012 04/28/2013 11/05/2013 05/30/2014 06/02/2014   Cholestrol 0 - 200 mg/dL 105 - 127 64 -   LDLCALC 0 - 100 mg/dL 37 - 74 27 -   HDL 40 - 60 mg/dL 33(L) - 33(L) 15(L) -   Trlycerides 0 - 200 mg/dL 173 - 99 108 -   Hemoglobin A1c 4.2 - 6.3 % - 5.9 - - 5.3       ADL UCSD:     Pulmonary Assessment Scores    Row Name 08/28/16 1201         ADL UCSD   ADL Phase Entry     SOB Score total 71     Rest 1     Walk 2     Stairs 5     Bath 2     Dress 3     Shop 3       mMRC Score   mMRC Score 1        Pulmonary Function Assessment:      Pulmonary Function Assessment - 08/28/16 1158      Initial Spirometry Results   FVC% 54 %   FEV1% 29 %   FEV1/FVC Ratio 39.67     Post Bronchodilator Spirometry Results   FVC% 64 %   FEV1% 37 %   FEV1/FVC Ratio 38.57     Breath   Bilateral Breath Sounds Decreased;Wheezes;Rales   Shortness of Breath Yes;Limiting activity;Fear of Shortness of Breath      Exercise Target Goals:    Exercise Program Goal: Individual exercise prescription set with THRR, safety & activity barriers. Participant demonstrates ability to understand and report RPE using BORG scale, to self-measure pulse accurately, and to acknowledge the importance of the exercise prescription.  Exercise Prescription Goal: Starting with aerobic activity 30 plus minutes a day, 3 days per week for initial exercise prescription. Provide home exercise prescription and guidelines that participant acknowledges understanding prior to discharge.  Activity Barriers & Risk Stratification:   6 Minute Walk:     6 Minute Walk    Row Name 03/26/16 0903 08/28/16 1154       6 Minute Walk   Phase Discharge Initial    Distance 1120 feet 980 feet    Distance % Change 48.7 %  367 ft  --    Walk Time 6 minutes 6 minutes    # of Rest Breaks 0 1    MPH 2.12 1.92    METS 2.82 2.48    RPE 13 13    Perceived Dyspnea  3 1    VO2 Peak 9.85 8.68    Symptoms No No    Resting HR 90 bpm 70 bpm    Resting BP --  94 dopplar 102/0    Max Ex. HR 111 bpm 107 bpm    Max Ex. BP --  98 dopplar 126/0    2 Minute Post BP  -- 104/0      Interval HR   Baseline HR  -- 70    1 Minute HR  -- 97    2 Minute HR  -- 97    3 Minute HR  -- 89    4 Minute HR  -- 92    6 Minute HR  -- 107  Interval Heart Rate?  -- Yes      Interval Oxygen   Interval Oxygen?  -- Yes    Baseline Oxygen Saturation % 95 % 95 %    Baseline Liters of Oxygen 0 L 2 L    1 Minute Oxygen Saturation %  -- 96 %    1 Minute Liters of Oxygen  -- 2 L    2 Minute Oxygen  Saturation %  -- 96 %    2 Minute Liters of Oxygen  -- 2 L    3 Minute Oxygen Saturation %  -- 95 %    3 Minute Liters of Oxygen  -- 2 L    4 Minute Oxygen Saturation %  -- 95 %    4 Minute Liters of Oxygen  -- 2 L    5 Minute Liters of Oxygen  -- 2 L    6 Minute Oxygen Saturation %  -- 95 %    6 Minute Liters of Oxygen  -- 2 L    2 Minute Post Oxygen Saturation %  -- 98 %    2 Minute Post Liters of Oxygen  -- 2 L      Oxygen Initial Assessment:     Oxygen Initial Assessment - 08/28/16 1252      Home Oxygen   Home Oxygen Device Portable Concentrator;E-Tanks   Sleep Oxygen Prescription Continuous   Liters per minute 2   Home Exercise Oxygen Prescription Continuous   Liters per minute 2   Home at Rest Exercise Oxygen Prescription Continuous   Liters per minute 2   Compliance with Home Oxygen Use Yes     Initial 6 min Walk   Oxygen Used Continuous;E-Tanks   Liters per minute 2   Resting Oxygen Saturation  during 6 min walk 95 %   Exercise Oxygen Saturation  during 6 min walk 95 %     Program Oxygen Prescription   Program Oxygen Prescription Continuous;E-Tanks   Liters per minute 2     Intervention   Short Term Goals To learn and exhibit compliance with exercise, home and travel O2 prescription;To learn and understand importance of monitoring SPO2 with pulse oximeter and demonstrate accurate use of the pulse oximeter.;To Learn and understand importance of maintaining oxygen saturations>88%;To learn and demonstrate proper purse lipped breathing techniques or other breathing techniques.;To learn and demonstrate proper use of respiratory medications   Long  Term Goals Exhibits compliance with exercise, home and travel O2 prescription;Verbalizes importance of monitoring SPO2 with pulse oximeter and return demonstration;Maintenance of O2 saturations>88%;Exhibits proper breathing techniques, such as purse lipped breathing or other method taught during program session;Compliance with  respiratory medication;Demonstrates proper use of MDIs      Oxygen Re-Evaluation:   Oxygen Discharge (Final Oxygen Re-Evaluation):   Initial Exercise Prescription:     Initial Exercise Prescription - 08/28/16 1200      Date of Initial Exercise RX and Referring Provider   Date 08/28/16   Referring Provider Raul Del     Oxygen   Oxygen Continuous   Liters 2     Treadmill   MPH 1.5   Grade 0   Minutes 15  3/3/3   METs 2.15     NuStep   Level 2   Minutes 15   METs 2     Biostep-RELP   Level 2   SPM 50   Minutes 15   METs 2     Prescription Details   Frequency (times per week) 3  Duration Progress to 45 minutes of aerobic exercise without signs/symptoms of physical distress     Intensity   THRR 40-80% of Max Heartrate 103-137   Ratings of Perceived Exertion 11-13   Perceived Dyspnea 0-4     Resistance Training   Training Prescription Yes   Weight 3   Reps 10-15      Perform Capillary Blood Glucose checks as needed.  Exercise Prescription Changes:     Exercise Prescription Changes    Row Name 03/14/16 1500 03/28/16 1500 09/03/16 1200         Response to Exercise   Blood Pressure (Admit) --  96 dopplar --  104 dopplar --  98 (doppler)     Blood Pressure (Exercise) --  94 dopplar --  82 dopplar --  96 (doppler)     Blood Pressure (Exit) --  96 dopplar --  86 dopplar  --     Heart Rate (Admit) 52 bpm 93 bpm 82 bpm     Heart Rate (Exercise) 117 bpm 119 bpm 95 bpm     Heart Rate (Exit) 94 bpm 86 bpm 96 bpm     Oxygen Saturation (Admit)  --  -- 97 %     Oxygen Saturation (Exercise)  --  -- 97 %     Oxygen Saturation (Exit)  --  -- 92 %     Rating of Perceived Exertion (Exercise) 13 15 12      Perceived Dyspnea (Exercise)  --  -- 1     Symptoms none none none     Comments Home Exercise Guidelines given 01/20/16 Home Exercise Guidelines given 01/20/16  --     Duration Progress to 45 minutes of aerobic exercise without signs/symptoms of physical  distress Progress to 45 minutes of aerobic exercise without signs/symptoms of physical distress Progress to 45 minutes of aerobic exercise without signs/symptoms of physical distress     Intensity THRR unchanged THRR unchanged THRR unchanged  103-137       Progression   Progression Continue to progress workloads to maintain intensity without signs/symptoms of physical distress. Continue to progress workloads to maintain intensity without signs/symptoms of physical distress.  --     Average METs 2.8 2.4  --       Resistance Training   Training Prescription Yes Yes Yes     Weight 4 lbs 4 lbs 3     Reps 10-15 10-15 10-15       Interval Training   Interval Training No No  --       Oxygen   Oxygen  --  -- Continuous     Liters  --  -- 2       Treadmill   MPH 2.2 2.2 1.5     Grade 1 1 0     Minutes 15 15 15   3/3/3     METs 2.99 2.99 2.15       NuStep   Level 4 4 1      Minutes 15 15 15      METs 2.3 2.2 2       REL-XR   Level 4 4  --     Minutes 15 15  --     METs 3.1 2  --       Biostep-RELP   Level  --  -- 1     SPM  --  -- 50     Minutes  --  -- 15     METs  --  --  2       Home Exercise Plan   Plans to continue exercise at Home  walking Home  walking  --     Frequency Add 2 additional days to program exercise sessions. Add 2 additional days to program exercise sessions.  --       Exercise Review   Progression Yes Yes  --        Exercise Comments:     Exercise Comments    Row Name 03/09/16 1012 03/14/16 1506 03/21/16 0809 03/26/16 0850 03/28/16 1556   Exercise Comments Reviewed METs average and discussed progression with pt today. William Bray continues to do well with exercise.   He has gotten up to 3.1 METs on the XR.  We will continue to monitor his progression. William Bray O2 sats were 87 so he is using 1 L during education and 2L during exercise. William Bray was admitted last Wednesday for fluid control.  They took off 12 lbs.  He is feeling much better and SaO2 was 95% today.   He completed his post 6 min walk today. William Bray continues to do well with exercise.  He works hard when he is here.  He will be graduating soon.  We will continue to montior his progress.   William Bray Name 03/30/16 1033 04/02/16 0911 09/03/16 1243       Exercise Comments Reviewed METs average and discussed progression with pt today.  William Bray graduated today from cardiac rehab with 36 sessions completed.  Details of the patient's exercise prescription and what He needs to do in order to continue the prescription and progress were discussed with patient.  Patient was given a copy of prescription and goals.  Patient verbalized understanding.  William Bray plans to continue to exercise by coming to [employer]. First full day of exercise!  Patient was oriented to gym and equipment including functions, settings, policies, and procedures.  Patient's individual exercise prescription and treatment plan were reviewed.  All starting workloads were established based on the results of the 6 minute walk test done at initial orientation visit.  The plan for exercise progression was also introduced and progression will be customized based on patient's performance and goals.        Exercise Goals and Review:     Exercise Goals    Row Name 08/28/16 1153             Exercise Goals   Increase Physical Activity Yes       Intervention Provide advice, education, support and counseling about physical activity/exercise needs.;Develop an individualized exercise prescription for aerobic and resistive training based on initial evaluation findings, risk stratification, comorbidities and participant's personal goals.       Expected Outcomes Achievement of increased cardiorespiratory fitness and enhanced flexibility, muscular endurance and strength shown through measurements of functional capacity and personal statement of participant.       Increase Strength and Stamina Yes       Intervention Provide advice, education, support and counseling  about physical activity/exercise needs.;Develop an individualized exercise prescription for aerobic and resistive training based on initial evaluation findings, risk stratification, comorbidities and participant's personal goals.       Expected Outcomes Achievement of increased cardiorespiratory fitness and enhanced flexibility, muscular endurance and strength shown through measurements of functional capacity and personal statement of participant.          Exercise Goals Re-Evaluation :   Discharge Exercise Prescription (Final Exercise Prescription Changes):     Exercise Prescription Changes - 09/03/16 1200  Response to Exercise   Blood Pressure (Admit) --  98 (doppler)   Blood Pressure (Exercise) --  96 (doppler)   Heart Rate (Admit) 82 bpm   Heart Rate (Exercise) 95 bpm   Heart Rate (Exit) 96 bpm   Oxygen Saturation (Admit) 97 %   Oxygen Saturation (Exercise) 97 %   Oxygen Saturation (Exit) 92 %   Rating of Perceived Exertion (Exercise) 12   Perceived Dyspnea (Exercise) 1   Symptoms none   Duration Progress to 45 minutes of aerobic exercise without signs/symptoms of physical distress   Intensity THRR unchanged  103-137     Resistance Training   Training Prescription Yes   Weight 3   Reps 10-15     Oxygen   Oxygen Continuous   Liters 2     Treadmill   MPH 1.5   Grade 0   Minutes 15  3/3/3   METs 2.15     NuStep   Level 1   Minutes 15   METs 2     Biostep-RELP   Level 1   SPM 50   Minutes 15   METs 2      Nutrition:  Target Goals: Understanding of nutrition guidelines, daily intake of sodium <1563m, cholesterol <2022m calories 30% from fat and 7% or less from saturated fats, daily to have 5 or more servings of fruits and vegetables.  Biometrics:     Pre Biometrics - 08/28/16 1153      Pre Biometrics   Height 5' 8.75" (1.746 m)   Weight 199 lb 11.2 oz (90.6 kg)   Waist Circumference 42.75 inches   Hip Circumference 45.25 inches   Waist to  Hip Ratio 0.94 %   BMI (Calculated) 29.8       Nutrition Therapy Plan and Nutrition Goals:   Nutrition Discharge: Rate Your Plate Scores:   Nutrition Goals Re-Evaluation:   Nutrition Goals Discharge (Final Nutrition Goals Re-Evaluation):   Psychosocial: Target Goals: Acknowledge presence or absence of significant depression and/or stress, maximize coping skills, provide positive support system. Participant is able to verbalize types and ability to use techniques and skills needed for reducing stress and depression.   Initial Review & Psychosocial Screening:     Initial Psych Review & Screening - 08/28/16 1213      Family Dynamics   Good Support System? Yes   Comments William Bray great support from his wife, daughter, and son. He has a positive  attitude, wants to do more activity, and is looking forward to participating in [place]    Barriers   Psychosocial barriers to participate in program There are no identifiable barriers or psychosocial needs.;The patient should benefit from training in stress management and relaxation.     Screening Interventions   Interventions Encouraged to exercise      Quality of Life Scores:     Quality of Life - 08/28/16 1215      Quality of Life Scores   Health/Function Pre 20.63 %   Socioeconomic Pre 20.67 %   Psych/Spiritual Pre 20.86 %   Family Pre 21 %   GLOBAL Pre 20.74 %      PHQ-9: Recent Review Flowsheet Data    [facility] 08/28/2016 01/02/2016   Decreased Interest 1 0   Down, Depressed, Hopeless 1 1   PHQ - 2 Score 2 1   Altered sleeping 1 2   Tired, decreased energy 1 1   Change in appetite 1 0  Feeling bad or failure about yourself  1 1   Trouble concentrating 1 1   Moving slowly or fidgety/restless 1 1   Suicidal thoughts 0 0   PHQ-9 Score 8 7   Difficult doing work/chores Not difficult at all Somewhat difficult     Interpretation of Total Score  Total Score Depression Severity:  1-4 =  Minimal depression, 5-9 = Mild depression, 10-14 = Moderate depression, 15-19 = Moderately severe depression, 20-27 = Severe depression   Psychosocial Evaluation and Intervention:   Psychosocial Re-Evaluation:   Psychosocial Discharge (Final Psychosocial Re-Evaluation):   Education: Education Goals: Education classes will be provided on a weekly basis, covering required topics. Participant will state understanding/return demonstration of topics presented.  Learning Barriers/Preferences:     Learning Barriers/Preferences - 08/28/16 1158      Learning Barriers/Preferences   Learning Barriers None   Learning Preferences None      Education Topics: Initial Evaluation Education: - Verbal, written and demonstration of respiratory meds, RPE/PD scales, oximetry and breathing techniques. Instruction on use of nebulizers and MDIs: cleaning and proper use, rinsing mouth with steroid doses and importance of monitoring MDI activations.   Pulmonary Rehab from 09/03/2016 in [facility] Cardiac and Pulmonary Rehab  Date  08/28/16  Educator  LB  Instruction Review Code  2- meets goals/outcomes      General Nutrition Guidelines/Fats and Fiber: -Group instruction provided by verbal, written material, models and posters to present the general guidelines for heart healthy nutrition. Gives an explanation and review of dietary fats and fiber.   Cardiac Rehab from 03/21/2016 in [facility] Cardiac and Pulmonary Rehab  Date  02/13/16  Educator  CR  Instruction Review Code  2- meets goals/outcomes      Controlling Sodium/Reading Food Labels: -Group verbal and written material supporting the discussion of sodium use in heart healthy nutrition. Review and explanation with models, verbal and written materials for utilization of the food label.   Exercise Physiology & Risk Factors: - Group verbal and written instruction with models to review the exercise physiology of the cardiovascular system and associated  critical values. Details cardiovascular disease risk factors and the goals associated with each risk factor.   Aerobic Exercise & Resistance Training: - Gives group verbal and written discussion on the health impact of inactivity. On the components of aerobic and resistive training programs and the benefits of this training and how to safely progress through these programs.   Cardiac Rehab from 03/21/2016 in [facility] Cardiac and Pulmonary Rehab  Date  02/29/16  Educator  [facility]  Instruction Review Code  2- meets goals/outcomes      Flexibility, Balance, General Exercise Guidelines: - Provides group verbal and written instruction on the benefits of flexibility and balance training programs. Provides general exercise guidelines with specific guidelines to those with heart or lung disease. Demonstration and skill practice provided.   Cardiac Rehab from 03/21/2016 in [facility] Cardiac and Pulmonary Rehab  Date  01/09/16  Educator  [facility]  Instruction Review Code  2- meets goals/outcomes      Stress Management: - Provides group verbal and written instruction about the health risks of elevated stress, cause of high stress, and healthy ways to reduce stress.   Cardiac Rehab from 03/21/2016 in [facility] Cardiac and Pulmonary Rehab  Date  01/11/16  Educator  [facility]  Instruction Review Code  2- meets goals/outcomes      Depression: - Provides group verbal and written instruction on the correlation between heart/lung disease and depressed mood,  treatment options, and the stigmas associated with seeking treatment.   Exercise & Equipment Safety: - Individual verbal instruction and demonstration of equipment use and safety with use of the equipment.   Pulmonary Rehab from 09/03/2016 in [facility] Cardiac and Pulmonary Rehab  Date  09/03/16  Educator  AS  Instruction Review Code  2- meets goals/outcomes      Infection Prevention: - Provides verbal and written material to individual with discussion of infection control  including proper hand washing and proper equipment cleaning during exercise session.   Pulmonary Rehab from 09/03/2016 in [facility] Cardiac and Pulmonary Rehab  Date  09/03/16  Educator  AS  Instruction Review Code  2- meets goals/outcomes      Falls Prevention: - Provides verbal and written material to individual with discussion of falls prevention and safety.   Pulmonary Rehab from 09/03/2016 in [facility] Cardiac and Pulmonary Rehab  Date  09/03/16  Educator  AS  Instruction Review Code  2- meets goals/outcomes      Diabetes: - Individual verbal and written instruction to review signs/symptoms of diabetes, desired ranges of glucose level fasting, after meals and with exercise. Advice that pre and post exercise glucose checks will be done for 3 sessions at entry of program.   Cardiac Rehab from 03/21/2016 in [facility] Cardiac and Pulmonary Rehab  Date  01/02/16  Educator  D. Joya Gaskins, RN  Instruction Review Code  2- meets goals/outcomes      Chronic Lung Diseases: - Group verbal and written instruction to review new updates, new respiratory medications, new advancements in procedures and treatments. Provide informative websites and "800" numbers of self-education.   Lung Procedures: - Group verbal and written instruction to describe testing methods done to diagnose lung disease. Review the outcome of test results. Describe the treatment choices: Pulmonary Function Tests, ABGs and oximetry.   Energy Conservation: - Provide group verbal and written instruction for methods to conserve energy, plan and organize activities. Instruct on pacing techniques, use of adaptive equipment and posture/positioning to relieve shortness of breath.   Triggers: - Group verbal and written instruction to review types of environmental controls: home humidity, furnaces, filters, dust mite/pet prevention, HEPA vacuums. To discuss weather changes, air quality and the benefits of nasal washing.   Exacerbations: - Group  verbal and written instruction to provide: warning signs, infection symptoms, calling MD promptly, preventive modes, and value of vaccinations. Review: effective airway clearance, coughing and/or vibration techniques. Create an Sports administrator.   Oxygen: - Individual and group verbal and written instruction on oxygen therapy. Includes supplement oxygen, available portable oxygen systems, continuous and intermittent flow rates, oxygen safety, concentrators, and Medicare reimbursement for oxygen.   Pulmonary Rehab from 09/03/2016 in [facility] Cardiac and Pulmonary Rehab  Date  08/28/16  Educator  LB  Instruction Review Code  2- meets goals/outcomes      Respiratory Medications: - Group verbal and written instruction to review medications for lung disease. Drug class, frequency, complications, importance of spacers, rinsing mouth after steroid MDI's, and proper cleaning methods for nebulizers.   Pulmonary Rehab from 09/03/2016 in [facility] Cardiac and Pulmonary Rehab  Date  08/28/16  Educator  LB  Instruction Review Code  2- meets goals/outcomes      AED/CPR: - Group verbal and written instruction with the use of models to demonstrate the basic use of the AED with the basic ABC's of resuscitation.   Breathing Retraining: - Provides individuals verbal and written instruction on purpose, frequency, and proper technique of diaphragmatic  breathing and pursed-lipped breathing. Applies individual practice skills.   Pulmonary Rehab from 09/03/2016 in [facility] Cardiac and Pulmonary Rehab  Date  09/03/16  Educator  AS  Instruction Review Code  2- meets goals/outcomes      Anatomy and Physiology of the Lungs: - Group verbal and written instruction with the use of models to provide basic lung anatomy and physiology related to function, structure and complications of lung disease.   Heart Failure: - Group verbal and written instruction on the basics of heart failure: signs/symptoms, treatments, explanation of  ejection fraction, enlarged heart and cardiomyopathy.   Sleep Apnea: - Individual verbal and written instruction to review Obstructive Sleep Apnea. Review of risk factors, methods for diagnosing and types of masks and machines for OSA.   Anxiety: - Provides group, verbal and written instruction on the correlation between heart/lung disease and anxiety, treatment options, and management of anxiety.   Relaxation: - Provides group, verbal and written instruction about the benefits of relaxation for patients with heart/lung disease. Also provides patients with examples of relaxation techniques.   Knowledge Questionnaire Score:     Knowledge Questionnaire Score - 08/28/16 1158      Knowledge Questionnaire Score   Pre Score 8/10       Core Components/Risk Factors/Patient Goals at Admission:     Personal Goals and Risk Factors at Admission - 08/28/16 1208      Core Components/Risk Factors/Patient Goals on Admission    Weight Management Yes;Weight Loss   Intervention Weight Management: Develop a combined nutrition and exercise program designed to reach desired caloric intake, while maintaining appropriate intake of nutrient and fiber, sodium and fats, and appropriate energy expenditure required for the weight goal.;Weight Management: Provide education and appropriate resources to help participant work on and attain dietary goals.;Weight Management/Obesity: Establish reasonable short term and long term weight goals.  Goal: lose 15lbs. Met with dietitian during HT   Expected Outcomes Short Term: Continue to assess and modify interventions until short term weight is achieved;Long Term: Adherence to nutrition and physical activity/exercise program aimed toward attainment of established weight goal;Weight Loss: Understanding of general recommendations for a balanced deficit meal plan, which promotes 1-2 lb weight loss per week and includes a negative energy balance of [phone]  kcal/d;Understanding of distribution of calorie intake throughout the day with the consumption of 4-5 meals/snacks;Understanding recommendations for meals to include 15-35% energy as protein, 25-35% energy from fat, 35-60% energy from carbohydrates, less than 26m of dietary cholesterol, 20-35 gm of total fiber daily   Improve shortness of breath with ADL's Yes   Intervention Provide education, individualized exercise plan and daily activity instruction to help decrease symptoms of SOB with activities of daily living.   Expected Outcomes Short Term: Achieves a reduction of symptoms when performing activities of daily living.   Develop more efficient breathing techniques such as purse lipped breathing and diaphragmatic breathing; and practicing self-pacing with activity Yes   Intervention Provide education, demonstration and support about specific breathing techniuqes utilized for more efficient breathing. Include techniques such as pursed lipped breathing, diaphragmatic breathing and self-pacing activity.   Expected Outcomes Short Term: Participant will be able to demonstrate and use breathing techniques as needed throughout daily activities.   Increase knowledge of respiratory medications and ability to use respiratory devices properly  Yes  Albuterol, Atrovent, Pulmicort, SVN; ProAir MDI; Uses spacer   Intervention Provide education and demonstration as needed of appropriate use of medications, inhalers, and oxygen therapy.   Expected Outcomes Short  Term: Achieves understanding of medications use. Understands that oxygen is a medication prescribed by physician. Demonstrates appropriate use of inhaler and oxygen therapy.   Diabetes Yes   Intervention Provide education about signs/symptoms and action to take for hypo/hyperglycemia.;Provide education about proper nutrition, including hydration, and aerobic/resistive exercise prescription along with prescribed medications to achieve blood glucose in  normal ranges: Fasting glucose 65-99 mg/dL   Expected Outcomes Short Term: Participant verbalizes understanding of the signs/symptoms and immediate care of hyper/hypoglycemia, proper foot care and importance of medication, aerobic/resistive exercise and nutrition plan for blood glucose control.;Long Term: Attainment of HbA1C < 7%.   Heart Failure Yes   Intervention Provide a combined exercise and nutrition program that is supplemented with education, support and counseling about heart failure. Directed toward relieving symptoms such as shortness of breath, decreased exercise tolerance, and extremity edema.   Expected Outcomes Improve functional capacity of life;Short term: Attendance in program 2-3 days a week with increased exercise capacity. Reported lower sodium intake. Reported increased fruit and vegetable intake. Reports medication compliance.;Short term: Daily weights obtained and reported for increase. Utilizing diuretic protocols set by physician.;Long term: Adoption of self-care skills and reduction of barriers for early signs and symptoms recognition and intervention leading to self-care maintenance.   Hypertension Yes   Intervention Provide education on lifestyle modifcations including regular physical activity/exercise, weight management, moderate sodium restriction and increased consumption of fresh fruit, vegetables, and low fat dairy, alcohol moderation, and smoking cessation.;Monitor prescription use compliance.   Expected Outcomes Short Term: Continued assessment and intervention until BP is < 140/16m HG in hypertensive participants. < 130/833mHG in hypertensive participants with diabetes, heart failure or chronic kidney disease.;Long Term: Maintenance of blood pressure at goal levels.   Lipids Yes   Intervention Provide education and support for participant on nutrition & aerobic/resistive exercise along with prescribed medications to achieve LDL <7032mHDL >62m67m Expected Outcomes  Short Term: Participant states understanding of desired cholesterol values and is compliant with medications prescribed. Participant is following exercise prescription and nutrition guidelines.;Long Term: Cholesterol controlled with medications as prescribed, with individualized exercise RX and with personalized nutrition plan. Value goals: LDL < 70mg76mL > 40 mg.      Core Components/Risk Factors/Patient Goals Review:      Goals and Risk Factor Review    Row Name 03/26/16 0846 09/03/16 1237           Core Components/Risk Factors/Patient Goals Review   Personal Goals Review Sedentary;Weight Management/Obesity;Increase Strength and Stamina;Improve shortness of breath with ADL's;Develop more efficient breathing techniques such as purse lipped breathing and diaphragmatic breathing and practicing self-pacing with activity.;Diabetes;Heart Failure;Lipids;Stress Develop more efficient breathing techniques such as purse lipped breathing and diaphragmatic breathing and practicing self-pacing with activity.      Review Lequan Dorinadmitted last week to be dieuresed.  They took off 12 lbs!  He is feeling much better.  His weight today was 180.1 lbs.  Greatly improved.  His breathing is better and he is using his PLB.  He is doing well on his statins and numbers were good last week.  His stress is better.  His blood pressure and blood sugars have been good. PLB was reveiwed with patient today and he demonstrated understanding of this breathing technique and when to use it.       Expected Outcomes William Bray William Bray continue to come to class to work on strength and stamina and breathing.  We will continue to monitor. William Bray William Bray use PLB  during exercise and ADL's to help control SOB and maintain acceptable oxygen saturations.          Core Components/Risk Factors/Patient Goals at Discharge (Final Review):      Goals and Risk Factor Review - 09/03/16 1237      Core Components/Risk Factors/Patient Goals Review    Personal Goals Review Develop more efficient breathing techniques such as purse lipped breathing and diaphragmatic breathing and practicing self-pacing with activity.   Review PLB was reveiwed with patient today and he demonstrated understanding of this breathing technique and when to use it.    Expected Outcomes William Bray will use PLB during exercise and ADL's to help control SOB and maintain acceptable oxygen saturations.       ITP Comments:     ITP Comments    Row Name 03/19/16 0853 09/03/16 1623         ITP Comments William Bray was readmitted and Life Flighted to [place] during vacation.  He had missed two doses of his dieuretic.  He was cleared to return to activity. William Bray was concerned that he would have a copay with [name]. We called his insurance, and he does have a $30.00 copay with each visit. William Bray requested discharge from [facility] and will attend [place] with his silver Sneakers.          Comments: William Bray was concerned that he would have a copay with [name]. We called his insurance, and he does have a $30.00 copay with each visit. William Kenley requested discharge from [facility] and will attend [place] with his silver Sneakers

## 2016-09-03 NOTE — Progress Notes (Signed)
Daily Session Note  Patient Details  Name: William Bray MRN: [id number] Date of Birth: [birth year] Referring Provider:     Pulmonary Rehab from 08/28/2016 in [facility] Cardiac and Pulmonary Rehab  Referring Provider  Raul Del      Encounter Date: 09/03/2016  Check In:     Session Check In - 09/03/16 1234      Check-In   Location [facility]-Cardiac & Pulmonary Rehab   Staff Present Nada Maclachlan, BA, ACSM CEP, Exercise Physiologist;Laureen Owens Shark, BS, RRT, Respiratory Bertis Ruddy, BS, ACSM CEP, Exercise Physiologist   Supervising physician immediately available to respond to emergencies [name] immediately available ER MD   Physician(s) Corky Downs and Jimmye Norman    Medication changes reported     No   Fall or balance concerns reported    No   Warm-up and Cool-down Performed as group-led instruction   Resistance Training Performed Yes   VAD Patient? Yes     VAD patient   Has back up controller? Yes   Has spare charged batteries? Yes   Has battery cables? Yes   Has compatible battery clips? Yes     Pain Assessment   Currently in Pain? No/denies   Multiple Pain Sites No           Exercise Prescription Changes - 09/03/16 1200      Response to Exercise   Blood Pressure (Admit) --  98 (doppler)   Blood Pressure (Exercise) --  96 (doppler)   Heart Rate (Admit) 82 bpm   Heart Rate (Exercise) 95 bpm   Heart Rate (Exit) 96 bpm   Oxygen Saturation (Admit) 97 %   Oxygen Saturation (Exercise) 97 %   Oxygen Saturation (Exit) 92 %   Rating of Perceived Exertion (Exercise) 12   Perceived Dyspnea (Exercise) 1   Symptoms none   Duration Progress to 45 minutes of aerobic exercise without signs/symptoms of physical distress   Intensity THRR unchanged  103-137     Resistance Training   Training Prescription Yes   Weight 3   Reps 10-15     Oxygen   Oxygen Continuous   Liters 2     Treadmill   MPH 1.5   Grade 0   Minutes 15  3/3/3   METs 2.15     NuStep   Level 1   Minutes 15   METs 2     Biostep-RELP   Level 1   SPM 50   Minutes 15   METs 2      History  Smoking Status  . Former Smoker  . Quit date: 05/28/2005  Smokeless Tobacco  . Never Used    Goals Met:  Proper associated with RPD/PD & O2 Sat Independence with exercise equipment Exercise tolerated well Personal goals reviewed Strength training completed today  Goals Unmet:  Not Applicable  Comments: First full day of exercise!  Patient was oriented to gym and equipment including functions, settings, policies, and procedures.  Patient's individual exercise prescription and treatment plan were reviewed.  All starting workloads were established based on the results of the 6 minute walk test done at initial orientation visit.  The plan for exercise progression was also introduced and progression will be customized based on patient's performance and goals.    Dr. Emily Filbert is Medical Director for [place] and [name].

## 2016-12-12 ENCOUNTER — Ambulatory Visit: Payer: Medicare HMO | Attending: Specialist

## 2016-12-12 DIAGNOSIS — G4733 Obstructive sleep apnea (adult) (pediatric): Secondary | ICD-10-CM | POA: Diagnosis not present

## 2016-12-12 DIAGNOSIS — R0902 Hypoxemia: Secondary | ICD-10-CM | POA: Insufficient documentation

## 2017-01-08 DIAGNOSIS — K5900 Constipation, unspecified: Secondary | ICD-10-CM | POA: Insufficient documentation

## 2017-01-17 DIAGNOSIS — I351 Nonrheumatic aortic (valve) insufficiency: Secondary | ICD-10-CM | POA: Insufficient documentation

## 2017-01-28 ENCOUNTER — Encounter: Payer: Self-pay | Admitting: Emergency Medicine

## 2017-01-28 ENCOUNTER — Emergency Department
Admission: EM | Admit: 2017-01-28 | Discharge: 2017-01-28 | Disposition: A | Discharge status: Other Acute Inpatient Hospital | Payer: Medicare HMO | Attending: Emergency Medicine | Admitting: Emergency Medicine

## 2017-01-28 DIAGNOSIS — I5022 Chronic systolic (congestive) heart failure: Secondary | ICD-10-CM | POA: Insufficient documentation

## 2017-01-28 DIAGNOSIS — Z95810 Presence of automatic (implantable) cardiac defibrillator: Secondary | ICD-10-CM | POA: Insufficient documentation

## 2017-01-28 DIAGNOSIS — Z95811 Presence of heart assist device: Secondary | ICD-10-CM | POA: Insufficient documentation

## 2017-01-28 DIAGNOSIS — E1140 Type 2 diabetes mellitus with diabetic neuropathy, unspecified: Secondary | ICD-10-CM | POA: Diagnosis not present

## 2017-01-28 DIAGNOSIS — R040 Epistaxis: Secondary | ICD-10-CM | POA: Diagnosis present

## 2017-01-28 DIAGNOSIS — D649 Anemia, unspecified: Secondary | ICD-10-CM | POA: Insufficient documentation

## 2017-01-28 DIAGNOSIS — Z79899 Other long term (current) drug therapy: Secondary | ICD-10-CM | POA: Insufficient documentation

## 2017-01-28 DIAGNOSIS — I2510 Atherosclerotic heart disease of native coronary artery without angina pectoris: Secondary | ICD-10-CM | POA: Insufficient documentation

## 2017-01-28 DIAGNOSIS — Z7901 Long term (current) use of anticoagulants: Secondary | ICD-10-CM | POA: Insufficient documentation

## 2017-01-28 DIAGNOSIS — Z87891 Personal history of nicotine dependence: Secondary | ICD-10-CM | POA: Insufficient documentation

## 2017-01-28 DIAGNOSIS — Z7982 Long term (current) use of aspirin: Secondary | ICD-10-CM | POA: Diagnosis not present

## 2017-01-28 DIAGNOSIS — I110 Hypertensive heart disease with heart failure: Secondary | ICD-10-CM | POA: Diagnosis not present

## 2017-01-28 DIAGNOSIS — J449 Chronic obstructive pulmonary disease, unspecified: Secondary | ICD-10-CM | POA: Insufficient documentation

## 2017-01-28 LAB — CBC WITH DIFFERENTIAL/PLATELET
Basophils Absolute: 0 10*3/uL (ref 0–0.1)
Basophils Relative: 1 %
EOS PCT: 4 %
Eosinophils Absolute: 0.3 10*3/uL (ref 0–0.7)
HEMATOCRIT: 27.9 % — AB (ref 40.0–52.0)
Hemoglobin: 9.1 g/dL — ABNORMAL LOW (ref 13.0–18.0)
LYMPHS ABS: 0.5 10*3/uL — AB (ref 1.0–3.6)
LYMPHS PCT: 7 %
MCH: 25.9 pg — ABNORMAL LOW (ref 26.0–34.0)
MCHC: 32.7 g/dL (ref 32.0–36.0)
MCV: 79.4 fL — AB (ref 80.0–100.0)
MONO ABS: 0.5 10*3/uL (ref 0.2–1.0)
MONOS PCT: 7 %
NEUTROS ABS: 5.7 10*3/uL (ref 1.4–6.5)
Neutrophils Relative %: 81 %
PLATELETS: 112 10*3/uL — AB (ref 150–440)
RBC: 3.52 MIL/uL — ABNORMAL LOW (ref 4.40–5.90)
RDW: 20.2 % — AB (ref 11.5–14.5)
WBC: 7 10*3/uL (ref 3.8–10.6)

## 2017-01-28 LAB — BASIC METABOLIC PANEL
ANION GAP: 7 (ref 5–15)
BUN: 20 mg/dL (ref 6–20)
CALCIUM: 8.6 mg/dL — AB (ref 8.9–10.3)
CO2: 33 mmol/L — AB (ref 22–32)
CREATININE: 0.84 mg/dL (ref 0.61–1.24)
Chloride: 93 mmol/L — ABNORMAL LOW (ref 101–111)
GFR calc Af Amer: >60 mL/min (ref >60)
GFR calc non Af Amer: >60 mL/min (ref >60)
GLUCOSE: 140 mg/dL — AB (ref 65–99)
Potassium: 3.6 mmol/L (ref 3.5–5.1)
Sodium: 133 mmol/L — ABNORMAL LOW (ref 135–145)

## 2017-01-28 LAB — PROTIME-INR
INR: 1.81
Prothrombin Time: 20.8 seconds — ABNORMAL HIGH (ref 11.4–15.2)

## 2017-01-28 MED ORDER — LIDOCAINE VISCOUS 2 % MT SOLN
OROMUCOSAL | Status: AC
  Filled 2017-01-28: qty 15

## 2017-01-28 MED ORDER — TRANEXAMIC ACID 1000 MG/10ML IV SOLN
500.0000 mg | Freq: Once | INTRAVENOUS | Status: DC
  Filled 2017-01-28: qty 10

## 2017-01-28 MED ORDER — LIDOCAINE VISCOUS 2 % MT SOLN: 15.0000 mL | Freq: Once | OROMUCOSAL | Status: DC

## 2017-01-28 NOTE — ED Provider Notes (Signed)
[facility] Emergency Department Provider Note   ____________________________________________   I have reviewed the triage vital signs and the nursing notes.   HISTORY  Chief Complaint Epistaxis   History limited by: Not Limited   HPI William Bray is a 66 y.o. male who presents to the emergency department today because of concerns for nosebleed. It started at 7 AM today. It has been constant since then. He thinks it is coming out of his left nostril. He denies any trauma. Patient is on warfarin. He does have an LVAD. He checked his INR today and it was less than 2. He denies any shortness of breath, weakness.    Past Medical History:  Diagnosis Date  . Arthritis   . Chronic back pain   . Diabetes mellitus without complication ([place])   . Hypertension   . Neuropathy     Patient Active Problem List   Diagnosis Date Noted  . Hyperlipidemia 01/02/2016  . GI bleed 10/28/2015  . Acute on chronic systolic heart failure ([place]) 10/26/2015  . NSVT (nonsustained ventricular tachycardia) ([place]) 10/23/2015  . 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 HOH (hard of hearing) 06/15/2015  . Patient in clinical research study 04/25/2015  . Lumbar stenosis with neurogenic claudication 03/14/2015  . Anxiety 03/02/2015  . Chronic anticoagulation 10/26/2014  . COPD (chronic obstructive pulmonary disease) ([place]) 09/26/2014  . LVAD (left ventricular assist device) present ([place]) 09/21/2014  . Iron deficiency anemia 07/23/2014  . Chronic systolic CHF (congestive heart failure) ([place]) 02/04/2014  . Encounter for fitting or adjustment of automatic implantable cardioverter-defibrillator 01/04/2014  . DDD (degenerative disc disease), lumbar 12/10/2013  . Lumbar radiculitis 12/10/2013  . Hypertriglyceridemia 10/30/2013  . OSA treated with BiPAP 10/30/2013  . PVC's (premature ventricular contractions) 10/30/2013  . Depression 09/25/2013  . Valvular regurgitation  07/04/2013  . Ischemic cardiomyopathy 04/07/2013  . Presence of drug coated stent in left circumflex coronary artery 02/17/2013  . Chronic low back pain 09/11/2012  . Essential hypertension 09/11/2012  . Automatic implantable cardioverter-defibrillator in situ 07/03/2011  . H/O ventricular fibrillation 03/27/2011  . Paroxysmal atrial fibrillation ([place]) 03/27/2011  . Steatohepatitis 03/05/2011  . CAD (coronary artery disease) 02/20/1990    Past Surgical History:  Procedure Laterality Date  . PAIN PUMP REVISION      Prior to Admission medications   Medication Sig Start Date End Date Taking? Authorizing Provider  albuterol (PROVENTIL) (2.5 MG/3ML) 0.083% nebulizer solution Inhale 3 mLs into the lungs every 6 (six) hours as needed. 12/14/15   [provider]  allopurinol (ZYLOPRIM) 100 MG tablet Take 100 mg by mouth daily. 12/05/15   [provider]  ALPRAZolam Duanne Moron) 0.5 MG tablet Take 0.5 mg by mouth 2 (two) times daily as needed. ONE IN THE MORNING AND ONE AT BEDTIME PRN 12/08/15   [provider]  aspirin EC 81 MG tablet Take 81 mg by mouth daily.    [provider]  budesonide (PULMICORT) 0.5 MG/2ML nebulizer solution Inhale 2 mLs into the lungs 2 (two) times daily. 12/14/15   [provider]  cetirizine (ZYRTEC) 10 MG tablet Take 10 mg by mouth daily. 06/15/15 06/14/16  [provider]  ferrous sulfate 324 (65 Fe) MG TBEC Take 324 mg by mouth every morning. WITH BREAKFAST 10/30/15   [provider]  FLUoxetine (PROZAC) 20 MG capsule Take 80 mg by mouth daily. 10/10/15   [provider]  gabapentin (NEURONTIN) 100 MG capsule Take 100 mg by mouth  daily. 10/10/15   [provider]  gabapentin (NEURONTIN) 300 MG capsule Take 300 mg by mouth at bedtime. 09/21/15   [provider]  lisinopril (PRINIVIL,ZESTRIL) 2.5 MG tablet Take 2.5 mg by mouth daily. 07/30/15 07/29/16  [provider]  metoprolol tartrate  (LOPRESSOR) 25 MG tablet Take 25 mg by mouth 2 (two) times daily. 10/30/15 10/29/16  [provider]  mirtazapine (REMERON) 30 MG tablet Take 0.5 tablets by mouth at bedtime. 10/18/15   [provider]  montelukast (SINGULAIR) 10 MG tablet Take 10 mg by mouth at bedtime. 09/19/15 09/18/16  [provider]  ondansetron (ZOFRAN) 4 MG tablet Take 4 mg by mouth every 8 (eight) hours as needed for nausea. 11/24/15   [provider]  oxyCODONE (OXY IR/ROXICODONE) 5 MG immediate release tablet Take 1 tablet by mouth 4 (four) times daily as needed. 01/13/16   [provider]  pantoprazole (PROTONIX) 40 MG tablet Take 40 mg by mouth daily. 11/24/15 11/23/16  [provider]  pravastatin (PRAVACHOL) 40 MG tablet Take 40 mg by mouth at bedtime. 11/24/15 11/23/16  [provider]  spironolactone (ALDACTONE) 25 MG tablet Take 25 mg by mouth daily. 03/09/15 03/08/16  [provider]  torsemide (DEMADEX) 20 MG tablet Take 2 tablets by mouth daily. 10/30/15 10/29/16  [provider]  umeclidinium-vilanterol (ANORO ELLIPTA) 62.5-25 MCG/INH AEPB Inhale 1 puff into the lungs daily. 10/11/15   [provider]  warfarin (COUMADIN) 5 MG tablet Take 5 mg by mouth as directed. Taking 5 mg M, W, F and 4 mg T, Th, Sat, Sun. 07/11/15 07/10/16  [provider]    Allergies Bee venom and Coreg [carvedilol]  History reviewed. No pertinent family history.  Social History Social History  Substance Use Topics  . Smoking status: Former Smoker    Quit date: 05/28/2005  . Smokeless tobacco: Never Used  . Alcohol use No    Review of Systems Constitutional: No fever/chills Eyes: No visual changes. ENT: positive for nosebleed Cardiovascular: Denies chest pain. Respiratory: Denies shortness of breath. Gastrointestinal: No abdominal pain.  No nausea, no vomiting.  No diarrhea.   Genitourinary: Negative for dysuria. Musculoskeletal: Negative for  back pain. Skin: Negative for rash. Neurological: Negative for headaches, focal weakness or numbness.  ____________________________________________   PHYSICAL EXAM:  VITAL SIGNS: ED Triage Vitals [01/28/17 1240]  Enc Vitals Group     BP 103/75     Pulse Rate (!) 129     Resp 15     Temp 98 F (36.7 C)     Temp Source Oral     SpO2 98 %     Weight 185 lb (83.9 kg)     Height 5' 10"  (1.778 m)   Constitutional: Alert and oriented. Well appearing and in no distress. Eyes: Conjunctivae are normal.  ENT   Head: Normocephalic and atraumatic.   Nose: No congestion/rhinnorhea.   Mouth/Throat: Mucous membranes are moist.   Neck: No stridor. Hematological/Lymphatic/Immunilogical: No cervical lymphadenopathy. Cardiovascular: whirr secondary to LVAD appreciated Respiratory: Normal respiratory effort without tachypnea nor retractions. Breath sounds are clear and equal bilaterally. No wheezes/rales/rhonchi. Gastrointestinal: Soft and non tender. No rebound. No guarding.  Genitourinary: Deferred Musculoskeletal: Normal range of motion in all extremities. No lower extremity edema. Neurologic:  Normal speech and language. No gross focal neurologic deficits are appreciated.  Skin:  Skin is warm, dry and intact. No rash noted. Psychiatric: Mood and affect are normal. Speech and behavior are normal. Patient exhibits  appropriate insight and judgment.  ____________________________________________    LABS (pertinent positives/negatives)  Labs Reviewed  PROTIME-INR - Abnormal; Notable for the following:       Result Value   Prothrombin Time 20.8 (*)    All other components within normal limits  CBC WITH DIFFERENTIAL/PLATELET - Abnormal; Notable for the following:    RBC 3.52 (*)    Hemoglobin 9.1 (*)    HCT 27.9 (*)    MCV 79.4 (*)    MCH 25.9 (*)    RDW 20.2 (*)    Platelets 112 (*)    Lymphs Abs 0.5 (*)    All other components within normal limits  BASIC METABOLIC  PANEL - Abnormal; Notable for the following:    Sodium 133 (*)    Chloride 93 (*)    CO2 33 (*)    Glucose, Bld 140 (*)    Calcium 8.6 (*)    All other components within normal limits     ____________________________________________   EKG  None  ____________________________________________    RADIOLOGY  None  ____________________________________________   PROCEDURES  Procedures  ____________________________________________   INITIAL IMPRESSION / ASSESSMENT AND PLAN / ED COURSE  Pertinent labs & imaging results that were available during my care of the patient were reviewed by me and considered in my medical decision making (see chart for details).  Patient presented to the emergency department today because of concerns for mostly patient patient is on warfarin secondary to LVAD. Patient's blood work does show anemia down to 9.1. 6 days ago his hemoglobin was 11.5. Given acute drop will plan on transferring to [facility]. Initially tried tea x-ray however patient continued to bleed. Did pack patient's nose with Merocel.  ____________________________________________   FINAL CLINICAL IMPRESSION(S) / ED DIAGNOSES  Final diagnoses:  Epistaxis  Anemia, unspecified type     Note: This dictation was prepared with Dragon dictation. Any transcriptional errors that result from this process are unintentional     Nance Pear, MD 01/28/17 1534

## 2017-01-28 NOTE — ED Triage Notes (Signed)
Pt to ed with c/o nosebleed that started about 7 am today.  Pt on coumadin at this time. Bleeding still noted at this time.  Nose clamp applied to pt nose at triage.

## 2017-01-28 NOTE — ED Notes (Signed)
EMTALA checked by this RN. MD to do reassessment.

## 2017-01-28 NOTE — ED Notes (Signed)
Pt given [place] sandwich and water.

## 2017-01-28 NOTE — ED Provider Notes (Signed)
-----------------------------------------   6:16 PM on 01/28/2017 -----------------------------------------  Transport team is here to pick up the patient. Patient has now left the emergency department remained stable. Patient was able to eat and drink prior to leaving the emergency department without difficulty.   Harvest Dark, MD 01/28/17 1816

## 2017-01-28 NOTE — ED Notes (Signed)
Called Katina on 3100 at [facility] to inform her that [facility] had arrived to bring pt to [facility]. Hosp.

## 2017-01-28 NOTE — ED Notes (Signed)
Called report to [employer], Therapist, sports at [facility]. Hosp. Pt to transfer to room 3118. Will call Lexi when transport is at bedside.

## 2017-07-10 DIAGNOSIS — Z79899 Other long term (current) drug therapy: Secondary | ICD-10-CM | POA: Insufficient documentation

## 2017-08-20 DIAGNOSIS — E0789 Other specified disorders of thyroid: Secondary | ICD-10-CM | POA: Insufficient documentation

## 2017-08-24 DIAGNOSIS — J101 Influenza due to other identified influenza virus with other respiratory manifestations: Secondary | ICD-10-CM | POA: Insufficient documentation

## 2017-09-30 ENCOUNTER — Encounter: Payer: Medicare Other | Attending: Pulmonary Disease

## 2017-09-30 ENCOUNTER — Other Ambulatory Visit: Payer: Self-pay

## 2017-09-30 VITALS — Ht 68.7 in | Wt 181.3 lb

## 2017-09-30 DIAGNOSIS — I10 Essential (primary) hypertension: Secondary | ICD-10-CM | POA: Diagnosis not present

## 2017-09-30 DIAGNOSIS — Z7901 Long term (current) use of anticoagulants: Secondary | ICD-10-CM | POA: Diagnosis not present

## 2017-09-30 DIAGNOSIS — Z79899 Other long term (current) drug therapy: Secondary | ICD-10-CM | POA: Diagnosis not present

## 2017-09-30 DIAGNOSIS — Z79891 Long term (current) use of opiate analgesic: Secondary | ICD-10-CM | POA: Diagnosis not present

## 2017-09-30 DIAGNOSIS — J449 Chronic obstructive pulmonary disease, unspecified: Secondary | ICD-10-CM | POA: Diagnosis present

## 2017-09-30 DIAGNOSIS — E119 Type 2 diabetes mellitus without complications: Secondary | ICD-10-CM | POA: Insufficient documentation

## 2017-09-30 DIAGNOSIS — Z7982 Long term (current) use of aspirin: Secondary | ICD-10-CM | POA: Insufficient documentation

## 2017-09-30 DIAGNOSIS — Z87891 Personal history of nicotine dependence: Secondary | ICD-10-CM | POA: Insufficient documentation

## 2017-09-30 NOTE — Patient Instructions (Signed)
Patient Instructions  Patient Details  Name: William Bray MRN: [id number] Date of Birth: September 06, 1950 Referring Provider:  Julienne Kass, MD  Below are your personal goals for exercise, nutrition, and risk factors. Our goal is to help you stay on track towards obtaining and maintaining these goals. We will be discussing your progress on these goals with you throughout the program.  Initial Exercise Prescription: Initial Exercise Prescription - 09/30/17 1500      Date of Initial Exercise RX and Referring Provider   Date  09/30/17    Referring Provider  [name] MD      Oxygen   Oxygen  Continuous    Liters  4      Treadmill   MPH  1.3    Grade  0    Minutes  15    METs  2      NuStep   Level  2    SPM  80    Minutes  15    METs  2      REL-XR   Level  1    Speed  50    Minutes  15    METs  2      Prescription Details   Frequency (times per week)  3    Duration  Progress to 45 minutes of aerobic exercise without signs/symptoms of physical distress      Intensity   THRR 40-80% of Max Heartrate  108-138    Ratings of Perceived Exertion  11-13    Perceived Dyspnea  0-4      Progression   Progression  Continue to progress workloads to maintain intensity without signs/symptoms of physical distress.      Resistance Training   Training Prescription  Yes    Weight  4 lbs    Reps  10-15       Exercise Goals: Frequency: Be able to perform aerobic exercise two to three times per week in program working toward 2-5 days per week of home exercise.  Intensity: Work with a perceived exertion of 11 (fairly light) - 15 (hard) while following your exercise prescription.  We will make changes to your prescription with you as you progress through the program.   Duration: Be able to do 30 to 45 minutes of continuous aerobic exercise in addition to a 5 minute warm-up and a 5 minute cool-down routine.   Nutrition Goals: Your personal nutrition goals will be established  when you do your nutrition analysis with the dietician.  The following are general nutrition guidelines to follow: Cholesterol < 228m/day Sodium < 15067mday Fiber: Men over 50 yrs - 30 grams per day  Personal Goals: Personal Goals and Risk Factors at Admission - 09/30/17 1438      Core Components/Risk Factors/Patient Goals on Admission    Weight Management  Yes;Weight Maintenance;Weight Loss    Intervention  Weight Management: Develop a combined nutrition and exercise program designed to reach desired caloric intake, while maintaining appropriate intake of nutrient and fiber, sodium and fats, and appropriate energy expenditure required for the weight goal.;Weight Management: Provide education and appropriate resources to help participant work on and attain dietary goals.;Weight Management/Obesity: Establish reasonable short term and long term weight goals.    Admit Weight  181 lb 4.8 oz (82.2 kg)    Goal Weight: Short Term  170 lb (77.1 kg)    Goal Weight: Long Term  170 lb (77.1 kg)    Expected Outcomes  Short Term: Continue  to assess and modify interventions until short term weight is achieved;Long Term: Adherence to nutrition and physical activity/exercise program aimed toward attainment of established weight goal;Weight Maintenance: Understanding of the daily nutrition guidelines, which includes 25-35% calories from fat, 7% or less cal from saturated fats, less than 241m cholesterol, less than 1.5gm of sodium, & 5 or more servings of fruits and vegetables daily;Weight Loss: Understanding of general recommendations for a balanced deficit meal plan, which promotes 1-2 lb weight loss per week and includes a negative energy balance of [phone] kcal/d;Understanding recommendations for meals to include 15-35% energy as protein, 25-35% energy from fat, 35-60% energy from carbohydrates, less than 2046mof dietary cholesterol, 20-35 gm of total fiber daily;Understanding of distribution of calorie  intake throughout the day with the consumption of 4-5 meals/snacks    Improve shortness of breath with ADL's  Yes    Intervention  Provide education, individualized exercise plan and daily activity instruction to help decrease symptoms of SOB with activities of daily living.    Expected Outcomes  Short Term: Improve cardiorespiratory fitness to achieve a reduction of symptoms when performing ADLs;Long Term: Be able to perform more ADLs without symptoms or delay the onset of symptoms    Diabetes  Yes    Intervention  Provide education about signs/symptoms and action to take for hypo/hyperglycemia.;Provide education about proper nutrition, including hydration, and aerobic/resistive exercise prescription along with prescribed medications to achieve blood glucose in normal ranges: Fasting glucose 65-99 mg/dL    Expected Outcomes  Short Term: Participant verbalizes understanding of the signs/symptoms and immediate care of hyper/hypoglycemia, proper foot care and importance of medication, aerobic/resistive exercise and nutrition plan for blood glucose control.;Long Term: Attainment of HbA1C < 7%.    Heart Failure  Yes    Intervention  Provide a combined exercise and nutrition program that is supplemented with education, support and counseling about heart failure. Directed toward relieving symptoms such as shortness of breath, decreased exercise tolerance, and extremity edema.    Expected Outcomes  Improve functional capacity of life;Short term: Attendance in program 2-3 days a week with increased exercise capacity. Reported lower sodium intake. Reported increased fruit and vegetable intake. Reports medication compliance.;Short term: Daily weights obtained and reported for increase. Utilizing diuretic protocols set by physician.;Long term: Adoption of self-care skills and reduction of barriers for early signs and symptoms recognition and intervention leading to self-care maintenance.    Hypertension  Yes     Intervention  Provide education on lifestyle modifcations including regular physical activity/exercise, weight management, moderate sodium restriction and increased consumption of fresh fruit, vegetables, and low fat dairy, alcohol moderation, and smoking cessation.;Monitor prescription use compliance.    Expected Outcomes  Short Term: Continued assessment and intervention until BP is < 140/9046mG in hypertensive participants. < 130/68m72m in hypertensive participants with diabetes, heart failure or chronic kidney disease.;Long Term: Maintenance of blood pressure at goal levels.       Tobacco Use Initial Evaluation: Social History   Tobacco Use  Smoking Status Former Smoker  . Packs/day: 1.00  . Years: 45.00  . Pack years: 45.00  . Types: Cigarettes  . Last attempt to quit: 05/28/2005  . Years since quitting: 12.3  Smokeless Tobacco Never Used    Exercise Goals and Review: Exercise Goals    Row Name 09/30/17 1600             Exercise Goals   Increase Physical Activity  Yes       Intervention  Provide advice, education, support and counseling about physical activity/exercise needs.;Develop an individualized exercise prescription for aerobic and resistive training based on initial evaluation findings, risk stratification, comorbidities and participant's personal goals.       Expected Outcomes  Short Term: Attend rehab on a regular basis to increase amount of physical activity.;Long Term: Add in home exercise to make exercise part of routine and to increase amount of physical activity.;Long Term: Exercising regularly at least 3-5 days a week.       Increase Strength and Stamina  Yes       Intervention  Provide advice, education, support and counseling about physical activity/exercise needs.;Develop an individualized exercise prescription for aerobic and resistive training based on initial evaluation findings, risk stratification, comorbidities and participant's personal goals.        Expected Outcomes  Short Term: Increase workloads from initial exercise prescription for resistance, speed, and METs.;Short Term: Perform resistance training exercises routinely during rehab and add in resistance training at home;Long Term: Improve cardiorespiratory fitness, muscular endurance and strength as measured by increased METs and functional capacity (6MWT)       Able to understand and use rate of perceived exertion (RPE) scale  Yes       Intervention  Provide education and explanation on how to use RPE scale       Expected Outcomes  Long Term:  Able to use RPE to guide intensity level when exercising independently;Short Term: Able to use RPE daily in rehab to express subjective intensity level       Able to understand and use Dyspnea scale  Yes       Intervention  Provide education and explanation on how to use Dyspnea scale       Expected Outcomes  Long Term: Able to use Dyspnea scale to guide intensity level when exercising independently;Short Term: Able to use Dyspnea scale daily in rehab to express subjective sense of shortness of breath during exertion       Knowledge and understanding of Target Heart Rate Range (THRR)  Yes       Intervention  Provide education and explanation of THRR including how the numbers were predicted and where they are located for reference       Expected Outcomes  Short Term: Able to state/look up THRR;Short Term: Able to use daily as guideline for intensity in rehab;Long Term: Able to use THRR to govern intensity when exercising independently       Able to check pulse independently  Yes       Intervention  Provide education and demonstration on how to check pulse in carotid and radial arteries.;Review the importance of being able to check your own pulse for safety during independent exercise       Expected Outcomes  Short Term: Able to explain why pulse checking is important during independent exercise;Long Term: Able to check pulse independently and accurately        Understanding of Exercise Prescription  Yes       Intervention  Provide education, explanation, and written materials on patient's individual exercise prescription       Expected Outcomes  Short Term: Able to explain program exercise prescription;Long Term: Able to explain home exercise prescription to exercise independently          Copy of goals given to participant.

## 2017-09-30 NOTE — Progress Notes (Signed)
Pulmonary Individual Treatment Plan  Patient Details  Name: William Bray MRN: [id number] Date of Birth: 1950/12/20 Referring Provider:     Pulmonary Rehab from 09/30/2017 in [facility] Cardiac and Pulmonary Rehab  Referring Provider  [name] MD      Initial Encounter Date:    Pulmonary Rehab from 09/30/2017 in [facility] Cardiac and Pulmonary Rehab  Date  09/30/17  Referring Provider  [name] MD      Visit Diagnosis: Chronic obstructive pulmonary disease, unspecified COPD type ([place])  Patient's Home Medications on Admission:  Current Outpatient Medications:  .  albuterol (PROVENTIL) (2.5 MG/3ML) 0.083% nebulizer solution, Inhale 3 mLs into the lungs every 6 (six) hours as needed., Disp: , Rfl:  .  allopurinol (ZYLOPRIM) 100 MG tablet, Take 100 mg by mouth daily., Disp: , Rfl:  .  ALPRAZolam (XANAX) 0.5 MG tablet, Take 0.5 mg by mouth 2 (two) times daily as needed. ONE IN THE MORNING AND ONE AT BEDTIME PRN, Disp: , Rfl:  .  aspirin EC 81 MG tablet, Take 81 mg by mouth daily., Disp: , Rfl:  .  budesonide (PULMICORT) 0.5 MG/2ML nebulizer solution, Inhale 2 mLs into the lungs 2 (two) times daily., Disp: , Rfl:  .  cetirizine (ZYRTEC) 10 MG tablet, Take 10 mg by mouth daily., Disp: , Rfl:  .  ferrous sulfate 324 (65 Fe) MG TBEC, Take 324 mg by mouth every morning. WITH BREAKFAST, Disp: , Rfl:  .  FLUoxetine (PROZAC) 20 MG capsule, Take 80 mg by mouth daily., Disp: , Rfl:  .  gabapentin (NEURONTIN) 100 MG capsule, Take 100 mg by mouth daily., Disp: , Rfl:  .  gabapentin (NEURONTIN) 300 MG capsule, Take 300 mg by mouth at bedtime., Disp: , Rfl:  .  lisinopril (PRINIVIL,ZESTRIL) 2.5 MG tablet, Take 2.5 mg by mouth daily., Disp: , Rfl:  .  metoprolol tartrate (LOPRESSOR) 25 MG tablet, Take 25 mg by mouth 2 (two) times daily., Disp: , Rfl:  .  mirtazapine (REMERON) 30 MG tablet, Take 0.5 tablets by mouth at bedtime., Disp: , Rfl:  .  montelukast (SINGULAIR) 10 MG tablet, Take 10 mg by mouth at  bedtime., Disp: , Rfl:  .  ondansetron (ZOFRAN) 4 MG tablet, Take 4 mg by mouth every 8 (eight) hours as needed for nausea., Disp: , Rfl:  .  oxyCODONE (OXY IR/ROXICODONE) 5 MG immediate release tablet, Take 1 tablet by mouth 4 (four) times daily as needed., Disp: , Rfl:  .  pantoprazole (PROTONIX) 40 MG tablet, Take 40 mg by mouth daily., Disp: , Rfl:  .  pravastatin (PRAVACHOL) 40 MG tablet, Take 40 mg by mouth at bedtime., Disp: , Rfl:  .  spironolactone (ALDACTONE) 25 MG tablet, Take 25 mg by mouth daily., Disp: , Rfl:  .  torsemide (DEMADEX) 20 MG tablet, Take 2 tablets by mouth daily., Disp: , Rfl:  .  umeclidinium-vilanterol (ANORO ELLIPTA) 62.5-25 MCG/INH AEPB, Inhale 1 puff into the lungs daily., Disp: , Rfl:  .  warfarin (COUMADIN) 5 MG tablet, Take 5 mg by mouth as directed. Taking 5 mg M, W, F and 4 mg T, Th, Sat, Sun., Disp: , Rfl:   Past Medical History: Past Medical History:  Diagnosis Date  . Arthritis   . Chronic back pain   . Diabetes mellitus without complication ([place])   . Hypertension   . Neuropathy     Tobacco Use: Social History   Tobacco Use  Smoking Status Former Smoker  . Packs/day:  1.00  . Years: 45.00  . Pack years: 45.00  . Types: Cigarettes  . Last attempt to quit: 05/28/2005  . Years since quitting: 12.3  Smokeless Tobacco Never Used    Labs: Recent Review Flowsheet Data    Labs for ITP Cardiac and Pulmonary Rehab Latest Ref Rng & Units 08/08/2012 04/28/2013 11/05/2013 05/30/2014 06/02/2014   Cholestrol 0 - 200 mg/dL 105 - 127 64 -   LDLCALC 0 - 100 mg/dL 37 - 74 27 -   HDL 40 - 60 mg/dL 33(L) - 33(L) 15(L) -   Trlycerides 0 - 200 mg/dL 173 - 99 108 -   Hemoglobin A1c 4.2 - 6.3 % - 5.9 - - 5.3       Pulmonary Assessment Scores: Pulmonary Assessment Scores    Row Name 09/30/17 1428         ADL UCSD   ADL Phase  Entry     SOB Score total  73     Rest  1     Walk  2     Stairs  5     Bath  2     Dress  3     Shop  5       CAT Score    CAT Score  22       mMRC Score   mMRC Score  3        Pulmonary Function Assessment: Pulmonary Function Assessment - 09/30/17 1429      Initial Spirometry Results   FVC%  54 %    FEV1%  29 %    FEV1/FVC Ratio  39.67    Comments  test done on 08/28/16      Post Bronchodilator Spirometry Results   FVC%  64 %    FEV1%  37 %    FEV1/FVC Ratio  38.57    Comments  test done on 08/28/16      Breath   Shortness of Breath  Yes;Limiting activity;Fear of Shortness of Breath       Exercise Target Goals: Date: 09/30/17  Exercise Program Goal: Individual exercise prescription set using results from initial 6 min walk test and THRR while considering  patient's activity barriers and safety.    Exercise Prescription Goal: Initial exercise prescription builds to 30-45 minutes a day of aerobic activity, 2-3 days per week.  Home exercise guidelines will be given to patient during program as part of exercise prescription that the participant will acknowledge.  Activity Barriers & Risk Stratification: Activity Barriers & Cardiac Risk Stratification - 09/30/17 1555      Activity Barriers & Cardiac Risk Stratification   Activity Barriers  Deconditioning;Muscular Weakness;Shortness of Breath;Decreased Ventricular Function;Balance Concerns;Back Problems chronic back pain       6 Minute Walk: 6 Minute Walk    Row Name 09/30/17 1550         6 Minute Walk   Phase  Initial     Distance  625 feet     Walk Time  3.9 minutes     # of Rest Breaks  4 15 sec, 44 sec, 49 sec, 20 sec     MPH  1.82     METS  2     RPE  17     Perceived Dyspnea   3     VO2 Peak  6.99     Symptoms  Yes (comment)     Comments  SOB, back pain 8/10     Resting HR  78 bpm     Resting BP  - 80 dopplar     Resting Oxygen Saturation   96 %     Exercise Oxygen Saturation  during 6 min walk  83 %     Max Ex. HR  121 bpm     Max Ex. BP  - 112 dopplar     2 Minute Post BP  - 114 dopplar, rck 106       Interval HR   4  Minute HR  117     5 Minute HR  121     6 Minute HR  117     2 Minute Post HR  103     Interval Heart Rate?  Yes       Interval Oxygen   Interval Oxygen?  Yes     Baseline Oxygen Saturation %  96 %     1 Minute Liters of Oxygen  4 L pulsed     2 Minute Liters of Oxygen  4 L     3 Minute Liters of Oxygen  4 L     4 Minute Oxygen Saturation %  86 %     4 Minute Liters of Oxygen  5 L pt increased himself     5 Minute Oxygen Saturation %  83 %     5 Minute Liters of Oxygen  5 L     6 Minute Oxygen Saturation %  89 % 85% once seated     6 Minute Liters of Oxygen  5 L     2 Minute Post Oxygen Saturation %  90 %     2 Minute Post Liters of Oxygen  4 L       Oxygen Initial Assessment: Oxygen Initial Assessment - 09/30/17 1436      Home Oxygen   Home Oxygen Device  Home Concentrator;Portable Concentrator;E-Tanks    Sleep Oxygen Prescription  Continuous;CPAP    Liters per minute  2    Home Exercise Oxygen Prescription  Continuous    Liters per minute  2    Home at Rest Exercise Oxygen Prescription  Continuous    Liters per minute  2    Compliance with Home Oxygen Use  Yes      Initial 6 min Walk   Oxygen Used  Portable Concentrator;Pulsed    Liters per minute  3      Program Oxygen Prescription   Program Oxygen Prescription  Continuous;E-Tanks    Liters per minute  3      Intervention   Short Term Goals  To learn and exhibit compliance with exercise, home and travel O2 prescription;To learn and understand importance of maintaining oxygen saturations>88%;To learn and demonstrate proper use of respiratory medications;To learn and demonstrate proper pursed lip breathing techniques or other breathing techniques.;To learn and understand importance of monitoring SPO2 with pulse oximeter and demonstrate accurate use of the pulse oximeter.    Long  Term Goals  Exhibits compliance with exercise, home and travel O2 prescription;Verbalizes importance of monitoring SPO2 with pulse oximeter  and return demonstration;Maintenance of O2 saturations>88%;Exhibits proper breathing techniques, such as pursed lip breathing or other method taught during program session;Compliance with respiratory medication;Demonstrates proper use of MDI's       Oxygen Re-Evaluation:   Oxygen Discharge (Final Oxygen Re-Evaluation):   Initial Exercise Prescription: Initial Exercise Prescription - 09/30/17 1500      Date of Initial Exercise RX and Referring Provider   Date  09/30/17  Referring Provider  [name] MD      Oxygen   Oxygen  Continuous    Liters  4      Treadmill   MPH  1.3    Grade  0    Minutes  15    METs  2      NuStep   Level  2    SPM  80    Minutes  15    METs  2      REL-XR   Level  1    Speed  50    Minutes  15    METs  2      Prescription Details   Frequency (times per week)  3    Duration  Progress to 45 minutes of aerobic exercise without signs/symptoms of physical distress      Intensity   THRR 40-80% of Max Heartrate  108-138    Ratings of Perceived Exertion  11-13    Perceived Dyspnea  0-4      Progression   Progression  Continue to progress workloads to maintain intensity without signs/symptoms of physical distress.      Resistance Training   Training Prescription  Yes    Weight  4 lbs    Reps  10-15       Perform Capillary Blood Glucose checks as needed.  Exercise Prescription Changes: Exercise Prescription Changes    Row Name 09/30/17 1500             Response to Exercise   Blood Pressure (Admit)  - 80 dopplar       Blood Pressure (Exercise)  - 112 dopplar       Blood Pressure (Exit)  - 114 dopplar, 106 dopplar recheck       Heart Rate (Admit)  78 bpm       Heart Rate (Exercise)  121 bpm       Heart Rate (Exit)  90 bpm       Oxygen Saturation (Admit)  96 %       Oxygen Saturation (Exercise)  83 %       Oxygen Saturation (Exit)  90 %       Rating of Perceived Exertion (Exercise)  17       Perceived Dyspnea (Exercise)   3       Symptoms  back pain 8/10, SOB       Comments  walk test results          Exercise Comments:   Exercise Goals and Review: Exercise Goals    Row Name 09/30/17 1600             Exercise Goals   Increase Physical Activity  Yes       Intervention  Provide advice, education, support and counseling about physical activity/exercise needs.;Develop an individualized exercise prescription for aerobic and resistive training based on initial evaluation findings, risk stratification, comorbidities and participant's personal goals.       Expected Outcomes  Short Term: Attend rehab on a regular basis to increase amount of physical activity.;Long Term: Add in home exercise to make exercise part of routine and to increase amount of physical activity.;Long Term: Exercising regularly at least 3-5 days a week.       Increase Strength and Stamina  Yes       Intervention  Provide advice, education, support and counseling about physical activity/exercise needs.;Develop an individualized exercise prescription for aerobic and resistive training based on initial evaluation  findings, risk stratification, comorbidities and participant's personal goals.       Expected Outcomes  Short Term: Increase workloads from initial exercise prescription for resistance, speed, and METs.;Short Term: Perform resistance training exercises routinely during rehab and add in resistance training at home;Long Term: Improve cardiorespiratory fitness, muscular endurance and strength as measured by increased METs and functional capacity (6MWT)       Able to understand and use rate of perceived exertion (RPE) scale  Yes       Intervention  Provide education and explanation on how to use RPE scale       Expected Outcomes  Long Term:  Able to use RPE to guide intensity level when exercising independently;Short Term: Able to use RPE daily in rehab to express subjective intensity level       Able to understand and use Dyspnea scale  Yes        Intervention  Provide education and explanation on how to use Dyspnea scale       Expected Outcomes  Long Term: Able to use Dyspnea scale to guide intensity level when exercising independently;Short Term: Able to use Dyspnea scale daily in rehab to express subjective sense of shortness of breath during exertion       Knowledge and understanding of Target Heart Rate Range (THRR)  Yes       Intervention  Provide education and explanation of THRR including how the numbers were predicted and where they are located for reference       Expected Outcomes  Short Term: Able to state/look up THRR;Short Term: Able to use daily as guideline for intensity in rehab;Long Term: Able to use THRR to govern intensity when exercising independently       Able to check pulse independently  Yes       Intervention  Provide education and demonstration on how to check pulse in carotid and radial arteries.;Review the importance of being able to check your own pulse for safety during independent exercise       Expected Outcomes  Short Term: Able to explain why pulse checking is important during independent exercise;Long Term: Able to check pulse independently and accurately       Understanding of Exercise Prescription  Yes       Intervention  Provide education, explanation, and written materials on patient's individual exercise prescription       Expected Outcomes  Short Term: Able to explain program exercise prescription;Long Term: Able to explain home exercise prescription to exercise independently          Exercise Goals Re-Evaluation :   Discharge Exercise Prescription (Final Exercise Prescription Changes): Exercise Prescription Changes - 09/30/17 1500      Response to Exercise   Blood Pressure (Admit)  -- 80 dopplar    Blood Pressure (Exercise)  -- 112 dopplar    Blood Pressure (Exit)  -- 114 dopplar, 106 dopplar recheck    Heart Rate (Admit)  78 bpm    Heart Rate (Exercise)  121 bpm    Heart Rate (Exit)   90 bpm    Oxygen Saturation (Admit)  96 %    Oxygen Saturation (Exercise)  83 %    Oxygen Saturation (Exit)  90 %    Rating of Perceived Exertion (Exercise)  17    Perceived Dyspnea (Exercise)  3    Symptoms  back pain 8/10, SOB    Comments  walk test results       Nutrition:  Target Goals: Understanding  of nutrition guidelines, daily intake of sodium <1552m, cholesterol <2071m calories 30% from fat and 7% or less from saturated fats, daily to have 5 or more servings of fruits and vegetables.  Biometrics: Pre Biometrics - 09/30/17 1601      Pre Biometrics   Height  5' 8.7" (1.745 m)    Weight  181 lb 4.8 oz (82.2 kg)    Waist Circumference  39 inches    Hip Circumference  38.5 inches    Waist to Hip Ratio  1.01 %    BMI (Calculated)  27.01    Single Leg Stand  5.15 seconds        Nutrition Therapy Plan and Nutrition Goals: Nutrition Therapy & Goals - 09/30/17 1427      Personal Nutrition Goals   Comments  He wants to maintain his weight. He states he is not as hungry and it could be his medications       Intervention Plan   Intervention  Prescribe, educate and counsel regarding individualized specific dietary modifications aiming towards targeted core components such as weight, hypertension, lipid management, diabetes, heart failure and other comorbidities.;Nutrition handout(s) given to patient.    Expected Outcomes  Short Term Goal: Understand basic principles of dietary content, such as calories, fat, sodium, cholesterol and nutrients.;Long Term Goal: Adherence to prescribed nutrition plan.       Nutrition Assessments: Nutrition Assessments - 09/30/17 1427      MEDFICTS Scores   Pre Score  30       Nutrition Goals Re-Evaluation:   Nutrition Goals Discharge (Final Nutrition Goals Re-Evaluation):   Psychosocial: Target Goals: Acknowledge presence or absence of significant depression and/or stress, maximize coping skills, provide positive support system.  Participant is able to verbalize types and ability to use techniques and skills needed for reducing stress and depression.   Initial Review & Psychosocial Screening: Initial Psych Review & Screening - 09/30/17 1425      Initial Review   Current issues with  History of Depression;Current Psychotropic Meds;Current Stress Concerns    Source of Stress Concerns  Chronic Illness;Unable to perform yard/household activities    Comments  He feels like his breathing is getting worse and wants to get better      Fa[place]irby Yes    Comments  Mr BuKneeas great support from his wife, daughter, and son.       Barriers   Psychosocial barriers to participate in program  The patient should benefit from training in stress management and relaxation.      Screening Interventions   Interventions  Encouraged to exercise;Provide feedback about the scores to participant;Program counselor consult;To provide support and resources with identified psychosocial needs    Expected Outcomes  Short Term goal: Utilizing psychosocial counselor, staff and physician to assist with identification of specific Stressors or current issues interfering with healing process. Setting desired goal for each stressor or current issue identified.;Long Term Goal: Stressors or current issues are controlled or eliminated.;Short Term goal: Identification and review with participant of any Quality of Life or Depression concerns found by scoring the questionnaire.;Long Term goal: The participant improves quality of Life and PHQ9 Scores as seen by post scores and/or verbalization of changes       Quality of Life Scores:  Scores of 19 and below usually indicate a poorer quality of life in these areas.  A difference of  2-3 points is a clinically meaningful difference.  A difference of  2-3 points in the total score of the Quality of Life Index has been associated with significant improvement in overall quality of life,  self-image, physical symptoms, and general health in studies assessing change in quality of life.  PHQ-9: Recent Review Flowsheet Data    Depression screen [facility] 2/9 09/30/2017 08/28/2016 01/02/2016   Decreased Interest 2 1 0   Down, Depressed, Hopeless _0 PHQ - 2 Score _1 Altered sleeping _2 Tired, decreased energy _3 Change in appetite 2 1 0   Feeling bad or failure about yourself  _4 Trouble concentrating _5 Moving slowly or fidgety/restless _6 Suicidal thoughts 0 0 0   PHQ-9 Score _7 Difficult doing work/chores Very difficult Not difficult at all Somewhat difficult     Interpretation of Total Score  Total Score Depression Severity:  1-4 = Minimal depression, 5-9 = Mild depression, 10-14 = Moderate depression, 15-19 = Moderately severe depression, 20-27 = Severe depression   Psychosocial Evaluation and Intervention:   Psychosocial Re-Evaluation:   Psychosocial Discharge (Final Psychosocial Re-Evaluation):   Education: Education Goals: Education classes will be provided on a weekly basis, covering required topics. Participant will state understanding/return demonstration of topics presented.  Learning Barriers/Preferences: Learning Barriers/Preferences - 09/30/17 1436      Learning Barriers/Preferences   Learning Barriers  None    Learning Preferences  None       Education Topics:  Initial Evaluation Education: - Verbal, written and demonstration of respiratory meds, oximetry and breathing techniques. Instruction on use of nebulizers and MDIs and importance of monitoring MDI activations.   Pulmonary Rehab from 09/30/2017 in [facility] Cardiac and Pulmonary Rehab  Date  09/30/17  Educator  [facility]  Instruction Review Code  1- Verbalizes Understanding      General Nutrition Guidelines/Fats and Fiber: -Group instruction provided by verbal, written material, models and posters to present the general guidelines for heart healthy nutrition.  Gives an explanation and review of dietary fats and fiber.   Cardiac Rehab from 03/21/2016 in [facility] Cardiac and Pulmonary Rehab  Date  02/13/16  Educator  CR  Instruction Review Code (retired)  2- meets goals/outcomes      Controlling Sodium/Reading Food Labels: -Group verbal and written material supporting the discussion of sodium use in heart healthy nutrition. Review and explanation with models, verbal and written materials for utilization of the food label.   Exercise Physiology & General Exercise Guidelines: - Group verbal and written instruction with models to review the exercise physiology of the cardiovascular system and associated critical values. Provides general exercise guidelines with specific guidelines to those with heart or lung disease.    Aerobic Exercise & Resistance Training: - Gives group verbal and written instruction on the various components of exercise. Focuses on aerobic and resistive training programs and the benefits of this training and how to safely progress through these programs.   Cardiac Rehab from 03/21/2016 in [facility] Cardiac and Pulmonary Rehab  Date  02/29/16  Educator  [facility]  Instruction Review Code (retired)  2- Statistician, Balance, Mind/Body Relaxation: Provides group verbal/written instruction on the benefits of flexibility and balance training, including mind/body exercise modes such as yoga, pilates and tai chi.  Demonstration and skill practice provided.   Cardiac Rehab from 03/21/2016 in [facility] Cardiac and Pulmonary Rehab  Date  01/09/16  Educator  [place]  Instruction Review Code (retired)  2- meets goals/outcomes      Stress and Anxiety: - Provides group verbal and written instruction about the health risks of elevated stress and causes of high stress.  Discuss the correlation between heart/lung disease and anxiety and treatment options. Review healthy ways to manage with stress and anxiety.   Cardiac Rehab from  03/21/2016 in [facility] Cardiac and Pulmonary Rehab  Date  01/11/16  Educator  [facility]  Instruction Review Code (retired)  2- meets goals/outcomes      Depression: - Provides group verbal and written instruction on the correlation between heart/lung disease and depressed mood, treatment options, and the stigmas associated with seeking treatment.   Exercise & Equipment Safety: - Individual verbal instruction and demonstration of equipment use and safety with use of the equipment.   Pulmonary Rehab from 09/30/2017 in [facility] Cardiac and Pulmonary Rehab  Date  09/30/17  Educator  [facility]  Instruction Review Code  1- Verbalizes Understanding      Infection Prevention: - Provides verbal and written material to individual with discussion of infection control including proper hand washing and proper equipment cleaning during exercise session.   Pulmonary Rehab from 09/30/2017 in [facility] Cardiac and Pulmonary Rehab  Date  09/30/17  Educator  [facility]  Instruction Review Code  1- Verbalizes Understanding      Falls Prevention: - Provides verbal and written material to individual with discussion of falls prevention and safety.   Pulmonary Rehab from 09/30/2017 in [facility] Cardiac and Pulmonary Rehab  Date  09/30/17  Educator  [facility]  Instruction Review Code  1- Verbalizes Understanding      Diabetes: - Individual verbal and written instruction to review signs/symptoms of diabetes, desired ranges of glucose level fasting, after meals and with exercise. Advice that pre and post exercise glucose checks will be done for 3 sessions at entry of program.   Cardiac Rehab from 03/21/2016 in [facility] Cardiac and Pulmonary Rehab  Date  01/02/16  Educator  D. Joya Gaskins, RN  Instruction Review Code (retired)  2- meets goals/outcomes      Chronic Lung Diseases: - Group verbal and written instruction to review updates, respiratory medications, advancements in procedures and treatments. Discuss use of supplemental oxygen including available  portable oxygen systems, continuous and intermittent flow rates, concentrators, personal use and safety guidelines. Review proper use of inhaler and spacers. Provide informative websites for self-education.    Energy Conservation: - Provide group verbal and written instruction for methods to conserve energy, plan and organize activities. Instruct on pacing techniques, use of adaptive equipment and posture/positioning to relieve shortness of breath.   Triggers and Exacerbations: - Group verbal and written instruction to review types of environmental triggers and ways to prevent exacerbations. Discuss weather changes, air quality and the benefits of nasal washing. Review warning signs and symptoms to help prevent infections. Discuss techniques for effective airway clearance, coughing, and vibrations.   AED/CPR: - Group verbal and written instruction with the use of models to demonstrate the basic use of the AED with the basic ABC's of resuscitation.   Anatomy and Physiology of the Lungs: - Group verbal and written instruction with the use of models to provide basic lung anatomy and physiology related to function, structure and complications of lung disease.   Anatomy & Physiology of the Heart: - Group verbal and written instruction and models provide basic cardiac anatomy and physiology, with the coronary electrical and arterial systems. Review of Valvular disease  and Heart Failure   Cardiac Rehab from 03/21/2016 in [facility] Cardiac and Pulmonary Rehab  Date  01/16/16  Educator  SB  Instruction Review Code (retired)  2- meets goals/outcomes      Cardiac Medications: - Group verbal and written instruction to review commonly prescribed medications for heart disease. Reviews the medication, class of the drug, and side effects.   Cardiac Rehab from 03/21/2016 in [facility] Cardiac and Pulmonary Rehab  Date  03/21/16 Marisue Humble 2]  Educator  SB  Instruction Review Code (retired)  2- meets goals/outcomes       Know Your Numbers and Risk Factors: -Group verbal and written instruction about important numbers in your health.  Discussion of what are risk factors and how they play a role in the disease process.  Review of Cholesterol, Blood Pressure, Diabetes, and BMI and the role they play in your overall health.   Sleep Hygiene: -Provides group verbal and written instruction about how sleep can affect your health.  Define sleep hygiene, discuss sleep cycles and impact of sleep habits. Review good sleep hygiene tips.    Other: -Provides group and verbal instruction on various topics (see comments)    Knowledge Questionnaire Score: Knowledge Questionnaire Score - 09/30/17 1436      Knowledge Questionnaire Score   Pre Score  15/18 reviewed with patient        Core Components/Risk Factors/Patient Goals at Admission: Personal Goals and Risk Factors at Admission - 09/30/17 1438      Core Components/Risk Factors/Patient Goals on Admission    Weight Management  Yes;Weight Maintenance;Weight Loss    Intervention  Weight Management: Develop a combined nutrition and exercise program designed to reach desired caloric intake, while maintaining appropriate intake of nutrient and fiber, sodium and fats, and appropriate energy expenditure required for the weight goal.;Weight Management: Provide education and appropriate resources to help participant work on and attain dietary goals.;Weight Management/Obesity: Establish reasonable short term and long term weight goals.    Admit Weight  181 lb 4.8 oz (82.2 kg)    Goal Weight: Short Term  170 lb (77.1 kg)    Goal Weight: Long Term  170 lb (77.1 kg)    Expected Outcomes  Short Term: Continue to assess and modify interventions until short term weight is achieved;Long Term: Adherence to nutrition and physical activity/exercise program aimed toward attainment of established weight goal;Weight Maintenance: Understanding of the daily nutrition guidelines, which  includes 25-35% calories from fat, 7% or less cal from saturated fats, less than 232m cholesterol, less than 1.5gm of sodium, & 5 or more servings of fruits and vegetables daily;Weight Loss: Understanding of general recommendations for a balanced deficit meal plan, which promotes 1-2 lb weight loss per week and includes a negative energy balance of [phone] kcal/d;Understanding recommendations for meals to include 15-35% energy as protein, 25-35% energy from fat, 35-60% energy from carbohydrates, less than 2026mof dietary cholesterol, 20-35 gm of total fiber daily;Understanding of distribution of calorie intake throughout the day with the consumption of 4-5 meals/snacks    Improve shortness of breath with ADL's  Yes    Intervention  Provide education, individualized exercise plan and daily activity instruction to help decrease symptoms of SOB with activities of daily living.    Expected Outcomes  Short Term: Improve cardiorespiratory fitness to achieve a reduction of symptoms when performing ADLs;Long Term: Be able to perform more ADLs without symptoms or delay the onset of symptoms    Diabetes  Yes    Intervention  Provide education about signs/symptoms and action to take for hypo/hyperglycemia.;Provide education about proper nutrition, including hydration, and aerobic/resistive exercise prescription along with prescribed medications to achieve blood glucose in normal ranges: Fasting glucose 65-99 mg/dL    Expected Outcomes  Short Term: Participant verbalizes understanding of the signs/symptoms and immediate care of hyper/hypoglycemia, proper foot care and importance of medication, aerobic/resistive exercise and nutrition plan for blood glucose control.;Long Term: Attainment of HbA1C < 7%.    Heart Failure  Yes    Intervention  Provide a combined exercise and nutrition program that is supplemented with education, support and counseling about heart failure. Directed toward relieving symptoms such as  shortness of breath, decreased exercise tolerance, and extremity edema.    Expected Outcomes  Improve functional capacity of life;Short term: Attendance in program 2-3 days a week with increased exercise capacity. Reported lower sodium intake. Reported increased fruit and vegetable intake. Reports medication compliance.;Short term: Daily weights obtained and reported for increase. Utilizing diuretic protocols set by physician.;Long term: Adoption of self-care skills and reduction of barriers for early signs and symptoms recognition and intervention leading to self-care maintenance.    Hypertension  Yes    Intervention  Provide education on lifestyle modifcations including regular physical activity/exercise, weight management, moderate sodium restriction and increased consumption of fresh fruit, vegetables, and low fat dairy, alcohol moderation, and smoking cessation.;Monitor prescription use compliance.    Expected Outcomes  Short Term: Continued assessment and intervention until BP is < 140/79m HG in hypertensive participants. < 130/876mHG in hypertensive participants with diabetes, heart failure or chronic kidney disease.;Long Term: Maintenance of blood pressure at goal levels.       Core Components/Risk Factors/Patient Goals Review:    Core Components/Risk Factors/Patient Goals at Discharge (Final Review):    ITP Comments: ITP Comments    Row Name 09/30/17 1400           ITP Comments  Medical Evaluation completed. Chart sent for review and changes to Dr. MaEmily Filbertirector of [facility]seyDiagnosis can be found in [facility]          Comments: Initial ITP

## 2017-10-04 ENCOUNTER — Encounter: Payer: Medicare Other | Admitting: *Deleted

## 2017-10-04 DIAGNOSIS — I5022 Chronic systolic (congestive) heart failure: Secondary | ICD-10-CM

## 2017-10-04 DIAGNOSIS — Z95811 Presence of heart assist device: Secondary | ICD-10-CM

## 2017-10-04 DIAGNOSIS — J449 Chronic obstructive pulmonary disease, unspecified: Secondary | ICD-10-CM | POA: Diagnosis not present

## 2017-10-04 LAB — GLUCOSE, CAPILLARY: Glucose-Capillary: 169 mg/dL — ABNORMAL HIGH (ref 65–99)

## 2017-10-04 NOTE — Progress Notes (Signed)
Daily Session Note  Patient Details  Name: William Bray MRN: [id number] Date of Birth: Oct 11, 1950 Referring Provider:     Pulmonary Rehab from 09/30/2017 in [facility] Cardiac and Pulmonary Rehab  Referring Provider  [name] MD      Encounter Date: 10/04/2017  Check In: Session Check In - 10/04/17 1046      Check-In   Location  [facility]-Cardiac & Pulmonary Rehab    Staff Present  Renita Papa, RN Vickki Hearing, BA, ACSM CEP, Exercise Physiologist;Other Constance Goltz RN    Supervising physician immediately available to respond to emergencies  [name] immediately available ER MD    Physician(s)  Dr. Clearnce Hasten and Alfred Levins    Medication changes reported      No    Fall or balance concerns reported     No    Tobacco Cessation  No Change    Warm-up and Cool-down  Performed as group-led instruction    Resistance Training Performed  Yes    VAD Patient?  Yes      VAD patient   Has back up controller?  Yes    Has spare charged batteries?  Yes    Has battery cables?  Yes    Has compatible battery clips?  Yes      Pain Assessment   Currently in Pain?  No/denies          Social History   Tobacco Use  Smoking Status Former Smoker  . Packs/day: 1.00  . Years: 45.00  . Pack years: 45.00  . Types: Cigarettes  . Last attempt to quit: 05/28/2005  . Years since quitting: 12.3  Smokeless Tobacco Never Used    Goals Met:  Proper associated with RPD/PD & O2 Sat Independence with exercise equipment Using PLB without cueing & demonstrates good technique Exercise tolerated well No report of cardiac concerns or symptoms Strength training completed today  Goals Unmet:  Not Applicable  Comments: First full day of exercise!  Patient was oriented to gym and equipment including functions, settings, policies, and procedures.  Patient's individual exercise prescription and treatment plan were reviewed.  All starting workloads were established based on the results of the 6 minute walk  test done at initial orientation visit.  The plan for exercise progression was also introduced and progression will be customized based on patient's performance and goals.    Dr. Emily Filbert is Medical Director for [place] and [name].

## 2017-10-07 ENCOUNTER — Encounter: Payer: Medicare Other | Admitting: *Deleted

## 2017-10-07 DIAGNOSIS — J449 Chronic obstructive pulmonary disease, unspecified: Secondary | ICD-10-CM | POA: Diagnosis not present

## 2017-10-07 DIAGNOSIS — Z95811 Presence of heart assist device: Secondary | ICD-10-CM

## 2017-10-07 DIAGNOSIS — I5022 Chronic systolic (congestive) heart failure: Secondary | ICD-10-CM

## 2017-10-07 LAB — GLUCOSE, CAPILLARY
GLUCOSE-CAPILLARY: 208 mg/dL — AB (ref 65–99)
GLUCOSE-CAPILLARY: 134 mg/dL — AB (ref 65–99)

## 2017-10-07 NOTE — Progress Notes (Signed)
Daily Session Note  Patient Details  Name: William Bray MRN: [id number] Date of Birth: April 11, 1951 Referring Provider:     Pulmonary Rehab from 09/30/2017 in [facility] Cardiac and Pulmonary Rehab  Referring Provider  [name] MD      Encounter Date: 10/07/2017  Check In: Session Check In - 10/07/17 1016      Check-In   Location  [facility]-Cardiac & Pulmonary Rehab    Staff Present  Nyoka Cowden, RN, BSN, [place]nita [place], BS, ACSM CEP, Exercise Physiologist;Amanda Oletta Darter, [place], ACSM CEP, Exercise Physiologist    Supervising physician immediately available to respond to emergencies  [name] immediately available ER MD    Physician(s)  Dr. Corky Downs and Dr. Alfred Levins    Medication changes reported      No    Fall or balance concerns reported     No    Tobacco Cessation  No Change    Warm-up and Cool-down  Performed as group-led instruction    Resistance Training Performed  Yes    VAD Patient?  Yes      VAD patient   Has back up controller?  Yes    Has spare charged batteries?  Yes    Has battery cables?  Yes    Has compatible battery clips?  Yes      Pain Assessment   Currently in Pain?  No/denies    Multiple Pain Sites  No          Social History   Tobacco Use  Smoking Status Former Smoker  . Packs/day: 1.00  . Years: 45.00  . Pack years: 45.00  . Types: Cigarettes  . Last attempt to quit: 05/28/2005  . Years since quitting: 12.3  Smokeless Tobacco Never Used    Goals Met:  Proper associated with RPD/PD & O2 Sat Independence with exercise equipment Exercise tolerated well No report of cardiac concerns or symptoms Strength training completed today  Goals Unmet:  Not Applicable  Comments: Pt able to follow exercise prescription today without complaint.  Will continue to monitor for progression.    Dr. Emily Filbert is Medical Director for [place] and [name].

## 2017-10-11 ENCOUNTER — Encounter: Payer: Medicare Other | Admitting: *Deleted

## 2017-10-11 DIAGNOSIS — J449 Chronic obstructive pulmonary disease, unspecified: Secondary | ICD-10-CM

## 2017-10-11 DIAGNOSIS — Z95811 Presence of heart assist device: Secondary | ICD-10-CM

## 2017-10-11 DIAGNOSIS — I5022 Chronic systolic (congestive) heart failure: Secondary | ICD-10-CM

## 2017-10-11 LAB — GLUCOSE, CAPILLARY
GLUCOSE-CAPILLARY: 170 mg/dL — AB (ref 65–99)
Glucose-Capillary: 147 mg/dL — ABNORMAL HIGH (ref 65–99)

## 2017-10-11 NOTE — Progress Notes (Signed)
Daily Session Note  Patient Details  Name: William Bray MRN: [id number] Date of Birth: February 01, 1951 Referring Provider:     Pulmonary Rehab from 09/30/2017 in [facility] Cardiac and Pulmonary Rehab  Referring Provider  [name] MD      Encounter Date: 10/11/2017  Check In: Session Check In - 10/11/17 1016      Check-In   Location  [facility]-Cardiac & Pulmonary Rehab    Staff Present  Renita Papa, RN Vickki Hearing, BA, ACSM CEP, Exercise Physiologist;Laureen Janell Quiet, RRT, Respiratory Therapist    Supervising physician immediately available to respond to emergencies  [name] immediately available ER MD    Physician(s)  Dr. Joni Fears and Corky Downs    Medication changes reported      No    Fall or balance concerns reported     No    Tobacco Cessation  No Change    Warm-up and Cool-down  Performed as group-led instruction    Resistance Training Performed  Yes    VAD Patient?  Yes      VAD patient   Has back up controller?  Yes    Has spare charged batteries?  Yes    Has battery cables?  Yes    Has compatible battery clips?  Yes      Pain Assessment   Currently in Pain?  No/denies          Social History   Tobacco Use  Smoking Status Former Smoker  . Packs/day: 1.00  . Years: 45.00  . Pack years: 45.00  . Types: Cigarettes  . Last attempt to quit: 05/28/2005  . Years since quitting: 12.3  Smokeless Tobacco Never Used    Goals Met:  Proper associated with RPD/PD & O2 Sat Independence with exercise equipment Using PLB without cueing & demonstrates good technique Exercise tolerated well No report of cardiac concerns or symptoms Strength training completed today  Goals Unmet:  Not Applicable  Comments: Pt able to follow exercise prescription today without complaint.  Will continue to monitor for progression.    Dr. Emily Filbert is Medical Director for [place] and [name].

## 2017-10-14 ENCOUNTER — Encounter: Payer: Medicare Other | Admitting: *Deleted

## 2017-10-14 DIAGNOSIS — J449 Chronic obstructive pulmonary disease, unspecified: Secondary | ICD-10-CM | POA: Diagnosis not present

## 2017-10-14 DIAGNOSIS — I5022 Chronic systolic (congestive) heart failure: Secondary | ICD-10-CM

## 2017-10-14 DIAGNOSIS — Z95811 Presence of heart assist device: Secondary | ICD-10-CM

## 2017-10-14 NOTE — Progress Notes (Signed)
Daily Session Note  Patient Details  Name: William Bray MRN: [id number] Date of Birth: 10/19/1950 Referring Provider:     Pulmonary Rehab from 09/30/2017 in [facility] Cardiac and Pulmonary Rehab  Referring Provider  [name] MD      Encounter Date: 10/14/2017  Check In: Session Check In - 10/14/17 1113      Check-In   Location  [facility]-Cardiac & Pulmonary Rehab    Staff Present  Earlean Shawl, BS, ACSM CEP, Exercise Physiologist;Amanda Oletta Darter, BA, ACSM CEP, Exercise Physiologist;Susanne Bice, RN, BSN, CCRP    Supervising physician immediately available to respond to emergencies  [name] immediately available ER MD    Physician(s)  Dr. Clearnce Hasten and Dr. Jimmye Norman    Medication changes reported      No    Fall or balance concerns reported     No    Tobacco Cessation  No Change    Warm-up and Cool-down  Performed as group-led instruction    Resistance Training Performed  Yes    VAD Patient?  Yes      VAD patient   Has back up controller?  Yes    Has spare charged batteries?  Yes    Has battery cables?  Yes    Has compatible battery clips?  Yes      Pain Assessment   Currently in Pain?  No/denies    Multiple Pain Sites  No          Social History   Tobacco Use  Smoking Status Former Smoker  . Packs/day: 1.00  . Years: 45.00  . Pack years: 45.00  . Types: Cigarettes  . Last attempt to quit: 05/28/2005  . Years since quitting: 12.3  Smokeless Tobacco Never Used    Goals Met:  Independence with exercise equipment Exercise tolerated well No report of cardiac concerns or symptoms Strength training completed today  Goals Unmet:  Not Applicable  Comments: Patient arrived and had gained 9 lbs in 7 days and was short of breath. We was advised by staff not to exercise and to call his [facility] team at [facility]. He called his his team and they cleared him to exercise over the phone. He did exercise at a very light intensity and tolerated it well.    Dr. Emily Filbert is Medical  Director for [place] and [name].

## 2017-10-16 ENCOUNTER — Encounter: Payer: Medicare Other | Admitting: *Deleted

## 2017-10-16 DIAGNOSIS — J449 Chronic obstructive pulmonary disease, unspecified: Secondary | ICD-10-CM

## 2017-10-16 DIAGNOSIS — Z95811 Presence of heart assist device: Secondary | ICD-10-CM

## 2017-10-16 DIAGNOSIS — I5022 Chronic systolic (congestive) heart failure: Secondary | ICD-10-CM

## 2017-10-16 NOTE — Progress Notes (Signed)
Daily Session Note  Patient Details  Name: KHARI MALLY MRN: [id number] Date of Birth: 08/15/1950 Referring Provider:     Pulmonary Rehab from 09/30/2017 in [facility] Cardiac and Pulmonary Rehab  Referring Provider  [name] MD      Encounter Date: 10/16/2017  Check In: Session Check In - 10/16/17 1015      Check-In   Location  [facility]-Cardiac & Pulmonary Rehab    Staff Present  [name], MA, RCEP, CCRP, Exercise Physiologist;Jaykub Mackins Sherryll Burger, RN Vickki Hearing, BA, ACSM CEP, Exercise Physiologist    Supervising physician immediately available to respond to emergencies  [name] immediately available ER MD    Physician(s)  Dr. Alfred Levins and Mariea Clonts    Medication changes reported      No    Fall or balance concerns reported     No    Tobacco Cessation  No Change    Warm-up and Cool-down  Performed as group-led instruction    Resistance Training Performed  Yes    VAD Patient?  Yes      VAD patient   Has back up controller?  Yes    Has spare charged batteries?  Yes    Has battery cables?  Yes    Has compatible battery clips?  Yes      Pain Assessment   Currently in Pain?  No/denies        Exercise Prescription Changes - 10/15/17 1200      Response to Exercise   Blood Pressure (Admit)  -- 84    Blood Pressure (Exit)  -- 84    Heart Rate (Admit)  84 bpm    Heart Rate (Exercise)  101 bpm    Oxygen Saturation (Admit)  89 %    Oxygen Saturation (Exercise)  97 %    Oxygen Saturation (Exit)  97 %    Rating of Perceived Exertion (Exercise)  12    Perceived Dyspnea (Exercise)  2    Duration  Progress to 45 minutes of aerobic exercise without signs/symptoms of physical distress    Intensity  THRR unchanged      Progression   Progression  Continue to progress workloads to maintain intensity without signs/symptoms of physical distress.      Resistance Training   Training Prescription  Yes    Weight  3 lb    Reps  10-15      Oxygen   Oxygen  Continuous    Liters  3       NuStep   Level  1    SPM  80    Minutes  15       Social History   Tobacco Use  Smoking Status Former Smoker  . Packs/day: 1.00  . Years: 45.00  . Pack years: 45.00  . Types: Cigarettes  . Last attempt to quit: 05/28/2005  . Years since quitting: 12.3  Smokeless Tobacco Never Used    Goals Met:  Proper associated with RPD/PD & O2 Sat Independence with exercise equipment Using PLB without cueing & demonstrates good technique Exercise tolerated well No report of cardiac concerns or symptoms Strength training completed today  Goals Unmet:  Not Applicable  Comments: Pt able to follow exercise prescription today without complaint.  Will continue to monitor for progression.    Dr. Emily Filbert is Medical Director for [place] and [name].

## 2017-10-18 ENCOUNTER — Encounter: Payer: Medicare Other | Admitting: *Deleted

## 2017-10-18 DIAGNOSIS — J449 Chronic obstructive pulmonary disease, unspecified: Secondary | ICD-10-CM

## 2017-10-18 NOTE — Progress Notes (Signed)
Daily Session Note  Patient Details  Name: GIO JANOSKI MRN: [id number] Date of Birth: 12/21/1950 Referring Provider:     Pulmonary Rehab from 09/30/2017 in [facility] Cardiac and Pulmonary Rehab  Referring Provider  [name] MD      Encounter Date: 10/18/2017  Check In: Session Check In - 10/18/17 1024      Check-In   Location  [facility]-Cardiac & Pulmonary Rehab    Staff Present  Darel Hong, RN BSN;Nana Addai, RN Vickki Hearing, BA, ACSM CEP, Exercise Physiologist    Supervising physician immediately available to respond to emergencies  [name] immediately available ER MD    Physician(s)  Drs. Malinda and Williams    Medication changes reported      No    Fall or balance concerns reported     No    Warm-up and Cool-down  Performed as group-led Higher education careers adviser Performed  Yes    VAD Patient?  Yes      VAD patient   Has back up controller?  Yes    Has spare charged batteries?  Yes    Has battery cables?  Yes    Has compatible battery clips?  Yes      Pain Assessment   Currently in Pain?  No/denies          Social History   Tobacco Use  Smoking Status Former Smoker  . Packs/day: 1.00  . Years: 45.00  . Pack years: 45.00  . Types: Cigarettes  . Last attempt to quit: 05/28/2005  . Years since quitting: 12.4  Smokeless Tobacco Never Used    Goals Met:  Independence with exercise equipment Exercise tolerated well No report of cardiac concerns or symptoms  Goals Unmet:  Not Applicable  Comments: Pt's BP was 64. Patient reported lightheadedness and weakness. He reported that he usually takes lisinopril 21m daily at night. He recently refilled his lisinopril and realized that the dose was 231minstead of 1012mHe checked his medication list and noticed it is 72m65m he took 72mg25mt night but he is not sure when the dose was changed to 72mg.42mwas asked to call his PCP to verify the right dose. PCP verbalized that dose is 10mg n10m0mg. P31mrunk 2 glasses of water. Will continue to monitor patient's status.    Dr. Mark MilEmily Filbertcal Director for [facility].

## 2017-10-18 NOTE — Progress Notes (Deleted)
Daily Session Note  Patient Details  Name: William Bray MRN: [id number] Date of Birth: 08-Dec-1950 Referring Provider:     Pulmonary Rehab from 09/30/2017 in [facility] Cardiac and Pulmonary Rehab  Referring Provider  [name] MD      Encounter Date: 10/18/2017  Check In: Session Check In - 10/18/17 1024      Check-In   Location  [facility]Cardiac & Pulmonary Rehab    Staff Present  Darel Hong, RN BSN;Nana Addai, RN Vickki Hearing, BA, ACSM CEP, Exercise Physiologist    Supervising physician immediately available to respond to emergencies  [name] immediately available ER MD    Physician(s)  Drs. Malinda and Williams    Medication changes reported      No    Fall or balance concerns reported     No    Warm-up and Cool-down  Performed as group-led Higher education careers adviser Performed  Yes    VAD Patient?  Yes      VAD patient   Has back up controller?  Yes    Has spare charged batteries?  Yes    Has battery cables?  Yes    Has compatible battery clips?  Yes      Pain Assessment   Currently in Pain?  No/denies          Social History   Tobacco Use  Smoking Status Former Smoker  . Packs/day: 1.00  . Years: 45.00  . Pack years: 45.00  . Types: Cigarettes  . Last attempt to quit: 05/28/2005  . Years since quitting: 12.4  Smokeless Tobacco Never Used    Goals Met:  Proper associated with RPD/PD & O2 Sat Independence with exercise equipment Using PLB without cueing & demonstrates good technique Exercise tolerated well No report of cardiac concerns or symptoms Strength training completed today  Goals Unmet:  Not Applicable  Comments: Pt able to follow exercise prescription today without complaint.  Will continue to monitor for progression. BP was low to start, given some water.   Dr. Emily Filbert is Medical Director for [place] and [name].

## 2017-10-22 ENCOUNTER — Encounter: Payer: Self-pay | Admitting: Nurse Practitioner

## 2017-10-22 ENCOUNTER — Other Ambulatory Visit: Payer: Self-pay

## 2017-10-22 ENCOUNTER — Ambulatory Visit: Payer: Medicare Other | Attending: Nurse Practitioner | Admitting: Nurse Practitioner

## 2017-10-22 DIAGNOSIS — I351 Nonrheumatic aortic (valve) insufficiency: Secondary | ICD-10-CM | POA: Insufficient documentation

## 2017-10-22 DIAGNOSIS — E785 Hyperlipidemia, unspecified: Secondary | ICD-10-CM | POA: Diagnosis not present

## 2017-10-22 DIAGNOSIS — I255 Ischemic cardiomyopathy: Secondary | ICD-10-CM | POA: Insufficient documentation

## 2017-10-22 DIAGNOSIS — Z79899 Other long term (current) drug therapy: Secondary | ICD-10-CM

## 2017-10-22 DIAGNOSIS — I10 Essential (primary) hypertension: Secondary | ICD-10-CM | POA: Insufficient documentation

## 2017-10-22 DIAGNOSIS — I480 Paroxysmal atrial fibrillation: Secondary | ICD-10-CM | POA: Diagnosis not present

## 2017-10-22 DIAGNOSIS — M542 Cervicalgia: Secondary | ICD-10-CM | POA: Diagnosis not present

## 2017-10-22 DIAGNOSIS — M545 Low back pain, unspecified: Secondary | ICD-10-CM

## 2017-10-22 DIAGNOSIS — D500 Iron deficiency anemia secondary to blood loss (chronic): Secondary | ICD-10-CM | POA: Diagnosis not present

## 2017-10-22 DIAGNOSIS — Z789 Other specified health status: Secondary | ICD-10-CM | POA: Diagnosis not present

## 2017-10-22 DIAGNOSIS — E119 Type 2 diabetes mellitus without complications: Secondary | ICD-10-CM | POA: Diagnosis not present

## 2017-10-22 DIAGNOSIS — I2510 Atherosclerotic heart disease of native coronary artery without angina pectoris: Secondary | ICD-10-CM | POA: Insufficient documentation

## 2017-10-22 DIAGNOSIS — E781 Pure hyperglyceridemia: Secondary | ICD-10-CM | POA: Insufficient documentation

## 2017-10-22 DIAGNOSIS — G894 Chronic pain syndrome: Secondary | ICD-10-CM

## 2017-10-22 DIAGNOSIS — F419 Anxiety disorder, unspecified: Secondary | ICD-10-CM | POA: Insufficient documentation

## 2017-10-22 DIAGNOSIS — Z79891 Long term (current) use of opiate analgesic: Secondary | ICD-10-CM | POA: Diagnosis not present

## 2017-10-22 DIAGNOSIS — M25512 Pain in left shoulder: Secondary | ICD-10-CM

## 2017-10-22 DIAGNOSIS — Z7982 Long term (current) use of aspirin: Secondary | ICD-10-CM | POA: Insufficient documentation

## 2017-10-22 DIAGNOSIS — M48062 Spinal stenosis, lumbar region with neurogenic claudication: Secondary | ICD-10-CM | POA: Insufficient documentation

## 2017-10-22 DIAGNOSIS — G8929 Other chronic pain: Secondary | ICD-10-CM | POA: Insufficient documentation

## 2017-10-22 DIAGNOSIS — Z95810 Presence of automatic (implantable) cardiac defibrillator: Secondary | ICD-10-CM | POA: Insufficient documentation

## 2017-10-22 DIAGNOSIS — M5136 Other intervertebral disc degeneration, lumbar region: Secondary | ICD-10-CM | POA: Insufficient documentation

## 2017-10-22 DIAGNOSIS — M1A9XX0 Chronic gout, unspecified, without tophus (tophi): Secondary | ICD-10-CM | POA: Diagnosis not present

## 2017-10-22 DIAGNOSIS — Z87891 Personal history of nicotine dependence: Secondary | ICD-10-CM | POA: Insufficient documentation

## 2017-10-22 DIAGNOSIS — Z7901 Long term (current) use of anticoagulants: Secondary | ICD-10-CM | POA: Diagnosis not present

## 2017-10-22 DIAGNOSIS — I5023 Acute on chronic systolic (congestive) heart failure: Secondary | ICD-10-CM | POA: Diagnosis not present

## 2017-10-22 DIAGNOSIS — M25511 Pain in right shoulder: Secondary | ICD-10-CM

## 2017-10-22 DIAGNOSIS — M899 Disorder of bone, unspecified: Secondary | ICD-10-CM | POA: Diagnosis not present

## 2017-10-22 NOTE — Patient Instructions (Signed)
____________________________________________________________________________________________  Appointment Policy Summary  It is our goal and responsibility to provide the medical community with assistance in the evaluation and management of patients with chronic pain. Unfortunately our resources are limited. Because we do not have an unlimited amount of time, or available appointments, we are required to closely monitor and manage their use. The following rules exist to maximize their use:  Patient's responsibilities: 1. Punctuality:  At what time should I arrive? You should be physically present in our office 30 minutes before your scheduled appointment. Your scheduled appointment is with your assigned healthcare provider. However, it takes 5-10 minutes to be "checked-in", and another 15 minutes for the nurses to do the admission. If you arrive to our office at the time you were given for your appointment, you will end up being at least 20-25 minutes late to your appointment with the provider. 2. Tardiness:  What happens if I arrive only a few minutes after my scheduled appointment time? You will need to reschedule your appointment. The cutoff is your appointment time. This is why it is so important that you arrive at least 30 minutes before that appointment. If you have an appointment scheduled for 10:00 AM and you arrive at 10:01, you will be required to reschedule your appointment.  3. Plan ahead:  Always assume that you will encounter traffic on your way in. Plan for it. If you are dependent on a driver, make sure they understand these rules and the need to arrive early. 4. Other appointments and responsibilities:  Avoid scheduling any other appointments before or after your pain clinic appointments.  5. Be prepared:  Write down everything that you need to discuss with your healthcare provider and give this information to the admitting nurse. Write down the medications that you will need  refilled. Bring your pills and bottles (even the empty ones), to all of your appointments, except for those where a procedure is scheduled. 6. No children or pets:  Find someone to take care of them. It is not appropriate to bring them in. 7. Scheduling changes:  We request "advanced notification" of any changes or cancellations. 8. Advanced notification:  Defined as a time period of more than 24 hours prior to the originally scheduled appointment. This allows for the appointment to be offered to other patients. 9. Rescheduling:  When a visit is rescheduled, it will require the cancellation of the original appointment. For this reason they both fall within the category of "Cancellations".  10. Cancellations:  They require advanced notification. Any cancellation less than 24 hours before the  appointment will be recorded as a "No Show". 11. No Show:  Defined as an unkept appointment where the patient failed to notify or declare to the practice their intention or inability to keep the appointment.  Corrective process for repeat offenders:  1. Tardiness: Three (3) episodes of rescheduling due to late arrivals will be recorded as one (1) "No Show". 2. Cancellation or reschedule: Three (3) cancellations or rescheduling will be recorded as one (1) "No Show". 3. "No Shows": Three (3) "No Shows" within a 12 month period will result in discharge from the practice. ____________________________________________________________________________________________  ____________________________________________________________________________________________  Pain Scale  Introduction: The pain score used by this practice is the Verbal Numerical Rating Scale (VNRS-11). This is an 11-point scale. It is for adults and children 10 years or older. There are significant differences in how the pain score is reported, used, and applied. Forget everything you learned in the past and learn  this scoring system.  General  Information: The scale should reflect your current level of pain. Unless you are specifically asked for the level of your worst pain, or your average pain. If you are asked for one of these two, then it should be understood that it is over the past 24 hours.  Basic Activities of Daily Living (ADL): Personal hygiene, dressing, eating, transferring, and using restroom.  Instructions: Most patients tend to report their level of pain as a combination of two factors, their physical pain and their psychosocial pain. This last one is also known as "suffering" and it is reflection of how physical pain affects you socially and psychologically. From now on, report them separately. From this point on, when asked to report your pain level, report only your physical pain. Use the following table for reference.  Pain Clinic Pain Levels (0-5/10)  Pain Level Score  Description  No Pain 0   Mild pain 1 Nagging, annoying, but does not interfere with basic activities of daily living (ADL). Patients are able to eat, bathe, get dressed, toileting (being able to get on and off the toilet and perform personal hygiene functions), transfer (move in and out of bed or a chair without assistance), and maintain continence (able to control bladder and bowel functions). Blood pressure and heart rate are unaffected. A normal heart rate for a healthy adult ranges from 60 to 100 bpm (beats per minute).   Mild to moderate pain 2 Noticeable and distracting. Impossible to hide from other people. More frequent flare-ups. Still possible to adapt and function close to normal. It can be very annoying and may have occasional stronger flare-ups. With discipline, patients may get used to it and adapt.   Moderate pain 3 Interferes significantly with activities of daily living (ADL). It becomes difficult to feed, bathe, get dressed, get on and off the toilet or to perform personal hygiene functions. Difficult to get in and out of bed or a chair  without assistance. Very distracting. With effort, it can be ignored when deeply involved in activities.   Moderately severe pain 4 Impossible to ignore for more than a few minutes. With effort, patients may still be able to manage work or participate in some social activities. Very difficult to concentrate. Signs of autonomic nervous system discharge are evident: dilated pupils (mydriasis); mild sweating (diaphoresis); sleep interference. Heart rate becomes elevated (>115 bpm). Diastolic blood pressure (lower number) rises above 100 mmHg. Patients find relief in laying down and not moving.   Severe pain 5 Intense and extremely unpleasant. Associated with frowning face and frequent crying. Pain overwhelms the senses.  Ability to do any activity or maintain social relationships becomes significantly limited. Conversation becomes difficult. Pacing back and forth is common, as getting into a comfortable position is nearly impossible. Pain wakes you up from deep sleep. Physical signs will be obvious: pupillary dilation; increased sweating; goosebumps; brisk reflexes; cold, clammy hands and feet; nausea, vomiting or dry heaves; loss of appetite; significant sleep disturbance with inability to fall asleep or to remain asleep. When persistent, significant weight loss is observed due to the complete loss of appetite and sleep deprivation.  Blood pressure and heart rate becomes significantly elevated. Caution: If elevated blood pressure triggers a pounding headache associated with blurred vision, then the patient should immediately seek attention at an urgent or emergency care unit, as these may be signs of an impending stroke.    Emergency Department Pain Levels (6-10/10)  Emergency Room Pain 6 Severely  limiting. Requires emergency care and should not be seen or managed at an outpatient pain management facility. Communication becomes difficult and requires great effort. Assistance to reach the emergency department  may be required. Facial flushing and profuse sweating along with potentially dangerous increases in heart rate and blood pressure will be evident.   Distressing pain 7 Self-care is very difficult. Assistance is required to transport, or use restroom. Assistance to reach the emergency department will be required. Tasks requiring coordination, such as bathing and getting dressed become very difficult.   Disabling pain 8 Self-care is no longer possible. At this level, pain is disabling. The individual is unable to do even the most "basic" activities such as walking, eating, bathing, dressing, transferring to a bed, or toileting. Fine motor skills are lost. It is difficult to think clearly.   Incapacitating pain 9 Pain becomes incapacitating. Thought processing is no longer possible. Difficult to remember your own name. Control of movement and coordination are lost.   The worst pain imaginable 10 At this level, most patients pass out from pain. When this level is reached, collapse of the autonomic nervous system occurs, leading to a sudden drop in blood pressure and heart rate. This in turn results in a temporary and dramatic drop in blood flow to the brain, leading to a loss of consciousness. Fainting is one of the body's self defense mechanisms. Passing out puts the brain in a calmed state and causes it to shut down for a while, in order to begin the healing process.    Summary: 1. Refer to this scale when providing [place] with your pain level. 2. Be accurate and careful when reporting your pain level. This will help with your care. 3. Over-reporting your pain level will lead to loss of credibility. 4. Even a level of 1/10 means that there is pain and will be treated at our facility. 5. High, inaccurate reporting will be documented as "Symptom Exaggeration", leading to loss of credibility and suspicions of possible secondary gains such as obtaining more narcotics, or wanting to appear disabled, for  fraudulent reasons. 6. Only pain levels of 5 or below will be seen at our facility. 7. Pain levels of 6 and above will be sent to the Emergency Department and the appointment cancelled. ____________________________________________________________________________________________

## 2017-10-22 NOTE — Progress Notes (Signed)
Patient's Name: William Bray  MRN: [id number]  Referring Provider: Juluis Pitch, MD  DOB: [birth year]  PCP: Juluis Pitch, MD  DOS: 10/22/2017  Note by: Dionisio David NP  Service setting: Ambulatory outpatient  Specialty: Interventional Pain Management  Location: [facility] (AMB) Pain Management Facility    Patient type: New Patient    Primary Reason(s) for Visit: Initial Patient Evaluation CC: Back Pain (lower)  HPI  Mr. William Bray is a 67 y.o. year old, male patient, who comes today for an initial evaluation. He has Acute on chronic systolic heart failure ([place]); Anxiety; Automatic implantable cardioverter-defibrillator in situ; CAD (coronary artery disease); Chronic anticoagulation; Chronic low back pain; Chronic systolic CHF (congestive heart failure) ([place]); Pulmonary emphysema (HCC); DDD (degenerative disc disease), lumbar; Depression; Presence of drug coated stent in left circumflex coronary artery; Encounter for fitting or adjustment of automatic implantable cardioverter-defibrillator; Essential hypertension; Epistaxis; H/O ventricular fibrillation; HOH (hard of hearing); Hypertriglyceridemia; Iron deficiency anemia due to chronic blood loss; Ischemic cardiomyopathy; Lumbar radiculitis; Lumbar stenosis with neurogenic claudication; LVAD (left ventricular assist device) present ([place]); 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; NSVT (nonsustained ventricular tachycardia) ([place]); OSA treated with BiPAP; Paroxysmal atrial fibrillation ([place]); Patient in clinical research study; PVC's (premature ventricular contractions); Steatohepatitis; Valvular regurgitation; Acute on chronic respiratory failure with hypercapnia ([place]); Chronic gouty arthritis; Aortic insufficiency; Complication involving left ventricular assist device (LVAD); Constipation; History of GI bleed; History of thrombocytopenia; Influenza A; Leukocytosis; On amiodarone therapy; Thyroid mass of unclear etiology; Type 2 diabetes mellitus without  complication, without long-term current use of insulin ([place]); Hyperlipidemia; Chronic bilateral low back pain without sciatica  (Primary Area of Pain) ; Chronic neck pain (Secondary Area of Pain) (Bilateral) (L>R); Chronic pain of both shoulders (Tertiary Area of Pain) (L>R); Chronic pain syndrome; Long term current use of opiate analgesic; Disorder of skeletal system; Problems influencing health status; and Pharmacologic therapy on their problem list.. His primarily concern today is the Back Pain (lower)  Pain Assessment: Location: Lower Back Radiating: Denies Onset: More than a month ago Duration: Chronic pain Quality: Sharp, Constant Severity: 7 /10 (subjective, self-reported pain score)  Note: Reported level is compatible with observation. Clinically the patient looks like a 2/10 A 2/10 is viewed as "Mild to Moderate" and described as noticeable and distracting. Impossible to hide from other people. More frequent flare-ups. Still possible to adapt and function close to normal. It can be very annoying and may have occasional stronger flare-ups. With discipline, patients may get used to it and adapt. Information on the proper use of the pain scale provided to the patient today. When using our objective Pain Scale, levels between 6 and 10/10 are said to belong in an emergency room, as it progressively worsens from a 6/10, described as severely limiting, requiring emergency care not usually available at an outpatient pain management facility. At a 6/10 level, communication becomes difficult and requires great effort. Assistance to reach the emergency department may be required. Facial flushing and profuse sweating along with potentially dangerous increases in heart rate and blood pressure will be evident. Effect on ADL: not much energy Timing: Constant Modifying factors: Tylenol, lay down and rest, heat and ice BP: (!) 97/53  HR: 77  Onset and Duration: Sudden and Present longer than 3 months Cause  of pain: Work related accident or event Severity: Getting worse Timing: During activity or exercise and After activity or exercise Aggravating Factors: Bending, Climbing, Kneeling, Lifiting, Motion, Squatting, Stooping , Twisting, Walking and Walking uphill Alleviating Factors: Lying down  Associated Problems: Constipation, Day-time cramps, Night-time cramps, Depression, Dizziness, Erectile dysfunction, Fatigue, Impotence, Numbness, Spasms, Tingling, Weakness, Pain that wakes patient up and Pain that does not allow patient to sleep Quality of Pain: Cruel and Sharp Previous Examinations or Tests: Cutaneous Pain Threshold Testing (CPT), Nerve block, X-rays and Psychiatric evaluation Previous Treatments: Narcotic medications, Physical Therapy, Pool exercises and Trigger point injections  The patient comes into the clinics today for the first time for a chronic pain management evaluation. Pertinent to the patient his primary area of pain is in his lower back. He states that the pain is midline. He denies the pain radiating. He admits the pain may have started in 1992 after cleaning his neighbors gutters.. He denies any previous surgeries. He admits that he has had epidural steroid injections by Dr. Lubertha Sayres. He did not feel like they were effective for more than 3-4 days. He did have physical therapy in 1990s it was effective for short period. He is currently in physical therapy. He denies any recent images.  His second area of pain is his neck. He admits that the left side is greater than the right.he denies any numbness or tingling in his arms. He denies any previous surgery, interventional therapy, physical therapy or recent images.  His third area of pain in his shoulders. He admits that the right is greater than the left. He denies any precipitating factors. He admits that he does have some weakness. He denies any numbness or tingling arms. He denies any previous surgery, interventional therapy,  physical therapy or recent.  He did admit that he was recently diagnosed with lung cancer but has declined any treatment.  Today I took the time to provide the patient with information regarding this pain practice. The patient was informed that the practice is divided into two sections: an interventional pain management section, as well as a completely separate and distinct medication management section. I explained that there are procedure days for interventional therapies, and evaluation days for follow-ups and medication management. Because of the amount of documentation required during both, they are kept separated. This means that there is the possibility that he may be scheduled for a procedure on one day, and medication management the next. I have also informed him that because of staffing and facility limitations, this practice will no longer take patients for medication management only. To illustrate the reasons for this, I gave the patient the example of surgeons, and how inappropriate it would be to refer a patient to his/her care, just to write for the post-surgical antibiotics on a surgery done by a different surgeon.   Because interventional pain management is part of the board-certified specialty for the doctors, the patient was informed that joining this practice means that they are open to any and all interventional therapies. I made it clear that this does not mean that they will be forced to have any procedures done. What this means is that I believe interventional therapies to be essential part of the diagnosis and proper management of chronic pain conditions. Therefore, patients not interested in these interventional alternatives will be better served under the care of a different practitioner.  The patient was also made aware of my Comprehensive Pain Management Safety Guidelines where by joining this practice, they limit all of their nerve blocks and joint injections to those done by  our practice, for as long as we are retained to manage their care. Historic Controlled Substance Pharmacotherapy Review  PMP and historical list of  controlled substances: alprazolam 0.5 mg, oxycodone 5 mg, Highest opioid analgesic regimen found: oxycodone 5 mg 4 times daily (last fill date 09/12/2017) oxycodone 20 mg per day Most recent opioid analgesic: oxycodone 5 mg 4 times daily (last fill date 09/12/2017) oxycodone 20 mg per day Current opioid analgesics: none Highest recorded MME/day: 30 day mg/day MME/day: 0 mg/day Medications: The patient did not bring the medication(s) to the appointment, as requested in our "New Patient Package" Pharmacodynamics: Desired effects: Analgesia: The patient reports >50% benefit. Reported improvement in function: The patient reports medication allows him to accomplish basic ADLs. Clinically meaningful improvement in function (CMIF): Sustained CMIF goals met Perceived effectiveness: Described as relatively effective, allowing for increase in activities of daily living (ADL) Undesirable effects: Side-effects or Adverse reactions: None reported Historical Monitoring: The patient  reports that he does not use drugs. List of all UDS Test(s): Lab Results  Component Value Date   MDMA NEGATIVE 05/29/2014   COCAINSCRNUR NEGATIVE 05/29/2014   PCPSCRNUR NEGATIVE 05/29/2014   THCU NEGATIVE 05/29/2014   List of all Serum Drug Screening Test(s):  No results found for: AMPHSCRSER, BARBSCRSER, BENZOSCRSER, COCAINSCRSER, PCPSCRSER, PCPQUANT, THCSCRSER, CANNABQUANT, OPIATESCRSER, OXYSCRSER, PROPOXSCRSER Historical Background Evaluation: [place] PDMP: Six (6) year initial data search conducted.             [place] Department of public safety, offender search: Editor, commissioning Information) Non-contributory Risk Assessment Profile: Aberrant behavior: None observed or detected today Risk factors for fatal opioid overdose: Benzodiazepine use, caucasian, COPD or asthma, male gender and  sleep apnea Fatal overdose hazard ratio (HR): Calculation deferred Non-fatal overdose hazard ratio (HR): Calculation deferred Risk of opioid abuse or dependence: 0.7-3.0% with doses ? 36 MME/day and 6.1-26% with doses ? 120 MME/day. Substance use disorder (SUD) risk level: Pending results of Medical Psychology Evaluation for SUD Opioid risk tool (ORT) (Total Score):    ORT Scoring interpretation table:  Score <3 = Low Risk for SUD  Score between 4-7 = Moderate Risk for SUD  Score >8 = High Risk for Opioid Abuse   PHQ-2 Depression Scale:  Total score: 4  PHQ-2 Scoring interpretation table: (Score and probability of major depressive disorder)  Score 0 = No depression  Score 1 = 15.4% Probability  Score 2 = 21.1% Probability  Score 3 = 38.4% Probability  Score 4 = 45.5% Probability  Score 5 = 56.4% Probability  Score 6 = 78.6% Probability   PHQ-9 Depression Scale:  Total score: 16  PHQ-9 Scoring interpretation table:  Score 0-4 = No depression  Score 5-9 = Mild depression  Score 10-14 = Moderate depression  Score 15-19 = Moderately severe depression  Score 20-27 = Severe depression (2.4 times higher risk of SUD and 2.89 times higher risk of overuse)   Pharmacologic Plan: Pending ordered tests and/or consults  Meds  The patient has a current medication list which includes the following prescription(s): albuterol, allopurinol, alprazolam, aspirin ec, budesonide, cyanocobalamin, ferrous sulfate, fluoxetine, gabapentin, magnesium oxide, mirtazapine, multivitamin with minerals, ondansetron, warfarin, warfarin, cetirizine, lisinopril, metoprolol tartrate, montelukast, pantoprazole, pravastatin, spironolactone, torsemide, umeclidinium-vilanterol, and warfarin.  Current Outpatient Medications on File Prior to Visit  Medication Sig  . albuterol (PROVENTIL) (2.5 MG/3ML) 0.083% nebulizer solution Inhale 3 mLs into the lungs every 6 (six) hours as needed.  Marland Kitchen allopurinol (ZYLOPRIM) 100 MG  tablet Take 100 mg by mouth daily.  Marland Kitchen ALPRAZolam (XANAX) 0.5 MG tablet Take 0.5 mg by mouth 2 (two) times daily as needed. ONE IN THE MORNING AND ONE AT BEDTIME  PRN  . aspirin EC 81 MG tablet Take 81 mg by mouth daily.  . budesonide (PULMICORT) 0.5 MG/2ML nebulizer solution Inhale 2 mLs into the lungs 2 (two) times daily.  . cyanocobalamin 100 MCG tablet Take 100 mcg by mouth daily.  . ferrous sulfate 324 (65 Fe) MG TBEC Take 324 mg by mouth every morning. WITH BREAKFAST  . FLUoxetine (PROZAC) 20 MG capsule Take 80 mg by mouth daily.  Marland Kitchen gabapentin (NEURONTIN) 300 MG capsule Take 600 mg by mouth 3 (three) times daily.   . magnesium oxide (MAG-OX) 400 MG tablet Take 400 mg by mouth daily.  . mirtazapine (REMERON) 30 MG tablet Take 0.5 tablets by mouth at bedtime.  . Multiple Vitamins-Minerals (MULTIVITAMIN WITH MINERALS) tablet Take 1 tablet by mouth daily.  . ondansetron (ZOFRAN) 4 MG tablet Take 4 mg by mouth every 8 (eight) hours as needed for nausea.  Marland Kitchen warfarin (COUMADIN) 3 MG tablet Take 3 mg by mouth 3 (three) times a week.  . warfarin (COUMADIN) 4 MG tablet Take 4 mg by mouth 4 (four) times a week.  . cetirizine (ZYRTEC) 10 MG tablet Take 10 mg by mouth daily.  Marland Kitchen lisinopril (PRINIVIL,ZESTRIL) 2.5 MG tablet Take 2.5 mg by mouth daily.  . metoprolol tartrate (LOPRESSOR) 25 MG tablet Take 25 mg by mouth 2 (two) times daily.  . montelukast (SINGULAIR) 10 MG tablet Take 10 mg by mouth at bedtime.  . pantoprazole (PROTONIX) 40 MG tablet Take 40 mg by mouth daily.  . pravastatin (PRAVACHOL) 40 MG tablet Take 40 mg by mouth at bedtime.  Marland Kitchen spironolactone (ALDACTONE) 25 MG tablet Take 25 mg by mouth daily.  Marland Kitchen torsemide (DEMADEX) 20 MG tablet Take 2 tablets by mouth daily.  Marland Kitchen umeclidinium-vilanterol (ANORO ELLIPTA) 62.5-25 MCG/INH AEPB Inhale 1 puff into the lungs daily.  Marland Kitchen warfarin (COUMADIN) 5 MG tablet Take 5 mg by mouth as directed. Taking 5 mg M, W, F and 4 mg T, Th, Sat, Sun.   No current  facility-administered medications on file prior to visit.    Imaging Review    Note: No new results found.        ROS  Cardiovascular History: Heart trouble, Abnormal heart rhythm, Daily Aspirin intake, High blood pressure, Chest pain, Heart attack ( Date: not noted), Heart surgery, Pacemaker or defibrillator, Heart failure, Weak heart (CHF), Heart valve problems, Heart catheterization and Blood thinners:  Antiplatelet and Anticoagulant Pulmonary or Respiratory History: Lung problems, Difficulty blowing air out (Emphysema), Shortness of breath, Smoking and Temporary stoppage of breathing during sleep Neurological History: Abnormal skin sensations (Peripheral Neuropathy) Review of Past Neurological Studies: No results found for this or any previous visit. Psychological-Psychiatric History: Anxiousness, Depressed, Prone to panicking and Difficulty sleeping and or falling asleep Gastrointestinal History: Reflux or heatburn and Irregular, infrequent bowel movements (Constipation) Genitourinary History: No reported renal or genitourinary signs or symptoms such as difficulty voiding or producing urine, peeing blood, non-functioning kidney, kidney stones, difficulty emptying the bladder, difficulty controlling the flow of urine, or chronic kidney disease Hematological History: Brusing easily and Bleeding easily Endocrine History: No reported endocrine signs or symptoms such as high or low blood sugar, rapid heart rate due to high thyroid levels, obesity or weight gain due to slow thyroid or thyroid disease Rheumatologic History: No reported rheumatological signs and symptoms such as fatigue, joint pain, tenderness, swelling, redness, heat, stiffness, decreased range of motion, with or without associated rash Musculoskeletal History: Negative for myasthenia gravis, muscular dystrophy, multiple sclerosis  or malignant hyperthermia Work History: Retired and Disabled  Allergies  Mr. Pilz is allergic to  bee venom and coreg [carvedilol].  Laboratory Chemistry  Inflammation Markers  (CRP: Acute Phase) (ESR: Chronic Phase) Renal Function Markers  Hepatic Function Markers  Electrolytes  Neuropathy Markers  Bone Pathology Markers  Coagulation Parameters Lab Results  Component Value Date   INR 1.81 01/28/2017   LABPROT 20.8 (H) 01/28/2017   APTT 32.1 06/21/2014   PLT 112 (L) 01/28/2017   Cardiovascular Markers Lab Results  Component Value Date   BNP 10,778 (H) 06/20/2014   HGB 9.1 (L) 01/28/2017   HCT 27.9 (L) 01/28/2017   Note: Lab results reviewed.  PFSH  Drug: Mr. Vondrasek  reports that he does not use drugs. Alcohol:  reports that he does not drink alcohol. Tobacco:  reports that he quit smoking about 12 years ago. His smoking use included cigarettes. He has a 45.00 pack-year smoking history. He has never used smokeless tobacco. Medical:  has a past medical history of Arthritis, Chronic back pain, Diabetes mellitus without complication ([place]), Hypertension, and Neuropathy. Family: family history includes Heart disease in his mother.  Past Surgical History:  Procedure Laterality Date  . ICD IMPLANT  2016  . LVAP  2016  . PAIN PUMP REVISION     Active Ambulatory Problems    Diagnosis Date Noted  . Acute on chronic systolic heart failure ([place]) 10/26/2015  . Anxiety 03/02/2015  . Automatic implantable cardioverter-defibrillator in situ 07/03/2011  . CAD (coronary artery disease) 02/20/1990  . Chronic anticoagulation 10/26/2014  . Chronic low back pain 09/11/2012  . Chronic systolic CHF (congestive heart failure) ([place]) 02/04/2014  . Pulmonary emphysema ([place]) 09/26/2014  . DDD (degenerative disc disease), lumbar 12/10/2013  . Depression 09/25/2013  . Presence of drug coated stent in left circumflex coronary artery 02/17/2013  . Encounter for fitting or adjustment of automatic implantable cardioverter-defibrillator 01/04/2014  . Essential hypertension 09/11/2012  .  Epistaxis 10/28/2015  . H/O ventricular fibrillation 03/27/2011  . HOH (hard of hearing) 06/15/2015  . Hypertriglyceridemia 10/30/2013  . Iron deficiency anemia due to chronic blood loss 07/23/2014  . Ischemic cardiomyopathy 04/07/2013  . Lumbar radiculitis 12/10/2013  . Lumbar stenosis with neurogenic claudication 03/14/2015  . LVAD (left ventricular assist device) present ([place]) 09/21/2014  . 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 NSVT (nonsustained ventricular tachycardia) ([place]) 10/23/2015  . OSA treated with BiPAP 10/30/2013  . Paroxysmal atrial fibrillation ([place]) 03/27/2011  . Patient in clinical research study 04/25/2015  . PVC's (premature ventricular contractions) 10/30/2013  . Steatohepatitis 03/05/2011  . Valvular regurgitation 07/04/2013  . Acute on chronic respiratory failure with hypercapnia ([place]) 05/13/2016  . Chronic gouty arthritis 02/21/2016  . Aortic insufficiency 01/17/2017  . 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 Constipation 01/08/2017  . History of GI bleed 10/28/2015  . History of thrombocytopenia 05/15/2016  . Influenza A 08/24/2017  . Leukocytosis 09/23/2014  . On amiodarone therapy 07/10/2017  . Thyroid mass of unclear etiology 08/20/2017  . Type 2 diabetes mellitus without complication, without long-term current use of insulin ([place]) 06/01/2016  . Hyperlipidemia 10/22/2017  . Chronic bilateral low back pain without sciatica  (Primary Area of Pain)  10/22/2017  . Chronic neck pain (Secondary Area of Pain) (Bilateral) (L>R) 10/22/2017  . Chronic pain of both shoulders [facility] Area of Pain) (L>R) 10/22/2017  . Chronic pain syndrome 10/22/2017  . Long term current use of opiate analgesic 10/22/2017  . Disorder  of skeletal system 10/22/2017  . Problems influencing health status 10/22/2017  . Pharmacologic therapy 10/22/2017   Resolved Ambulatory Problems    Diagnosis Date Noted  . Hyperlipidemia  01/02/2016   Past Medical History:  Diagnosis Date  . Arthritis   . Chronic back pain   . Diabetes mellitus without complication ([place])   . Hypertension   . Neuropathy    Constitutional Exam  General appearance: Well nourished, well developed, and well hydrated. In no apparent acute distress Vitals:   10/22/17 1248  BP: (!) 97/53  Pulse: 77  Temp: 98.3 F (36.8 C)  SpO2: 97%  Weight: 180 lb (81.6 kg)  Height: 5' 10"  (1.778 m)   BMI Assessment: Estimated body mass index is 25.83 kg/m as calculated from the following:   Height as of this encounter: 5' 10"  (1.778 m).   Weight as of this encounter: 180 lb (81.6 kg).  BMI interpretation table: BMI level Category Range association with higher incidence of chronic pain  <18 kg/m2 Underweight   18.5-24.9 kg/m2 Ideal body weight   25-29.9 kg/m2 Overweight Increased incidence by 20%  30-34.9 kg/m2 Obese (Class I) Increased incidence by 68%  35-39.9 kg/m2 Severe obesity (Class II) Increased incidence by 136%  >40 kg/m2 Extreme obesity (Class III) Increased incidence by 254%   BMI Readings from Last 4 Encounters:  10/22/17 25.83 kg/m  09/30/17 27.01 kg/m  01/28/17 26.54 kg/m  08/28/16 29.71 kg/m   Wt Readings from Last 4 Encounters:  10/22/17 180 lb (81.6 kg)  09/30/17 181 lb 4.8 oz (82.2 kg)  01/28/17 185 lb (83.9 kg)  08/28/16 199 lb 11.2 oz (90.6 kg)  Psych/Mental status: Alert, oriented x 3 (person, place, & time)       Eyes: PERLA Respiratory: Oxygen-dependent COPD  Cervical Spine Exam  Inspection: No masses, redness, or swelling Alignment: Symmetrical Functional ROM: Pain restricted ROM      Stability: No instability detected Muscle strength & Tone: Functionally intact Sensory: Unimpaired Palpation: Complains of area being tender to palpation              Upper Extremity (UE) Exam    Side: Right upper extremity  Side: Left upper extremity  Inspection: No masses, redness, swelling, or asymmetry. No  contractures  Inspection: No masses, redness, swelling, or asymmetry. No contractures  Functional ROM: Full ROM          Functional ROM: Full ROM          Muscle strength & Tone: Normal strength (5/5)  Muscle strength & Tone: Normal strength (5/5)  Sensory: Unimpaired  Sensory: Unimpaired  Palpation: No palpable anomalies              Palpation: No palpable anomalies              Specialized Test(s): Deferred         Specialized Test(s): Deferred          Thoracic Spine Exam  Inspection: No masses, redness, or swelling Alignment: Symmetrical Functional ROM: Unrestricted ROM Stability: No instability detected Sensory: Unimpaired Muscle strength & Tone: No palpable anomalies  Lumbar Spine Exam  Inspection: No masses, redness, or swelling Alignment: Symmetrical Functional ROM: Unrestricted ROM      Stability: No instability detected Muscle strength & Tone: Functionally intact Sensory: Unimpaired Palpation: Complains of area being tender to palpation       Provocative Tests: Lumbar Hyperextension and rotation test: Positive bilaterally for facet joint pain. Patrick's Maneuver: evaluation deferred today  Gait & Posture Assessment  Ambulation: Unassisted Gait: Relatively normal for age and body habitus Posture: WNL   Lower Extremity Exam    Side: Right lower extremity  Side: Left lower extremity  Inspection: No masses, redness, swelling, or asymmetry. No contractures  Inspection: No masses, redness, swelling, or asymmetry. No contractures  Functional ROM: Unrestricted ROM          Functional ROM: Unrestricted ROM          Muscle strength & Tone: Functionally intact  Muscle strength & Tone: Functionally intact  Sensory: Unimpaired  Sensory: Unimpaired  Palpation: No palpable anomalies  Palpation: No palpable anomalies   Assessment  Primary Diagnosis & Pertinent Problem List: Diagnoses of Chronic bilateral low back pain without sciatica, Chronic neck pain  (Secondary Area of Pain) (Bilateral) (L>R), Chronic pain of both shoulders (L>R), Chronic pain syndrome, Long term current use of opiate analgesic, Disorder of skeletal system, Problems influencing health status, and Pharmacologic therapy were pertinent to this visit.  Visit Diagnosis: 1. Chronic bilateral low back pain without sciatica   2. Chronic neck pain (Secondary Area of Pain) (Bilateral) (L>R)   3. Chronic pain of both shoulders (L>R)   4. Chronic pain syndrome   5. Long term current use of opiate analgesic   6. Disorder of skeletal system   7. Problems influencing health status   8. Pharmacologic therapy    Plan of Care  Initial treatment plan:  Please be advised that as per protocol, today's visit has been an evaluation only. We have not taken over the patient's controlled substance management.  Problem-specific plan: No problem-specific Assessment & Plan notes found for this encounter.  Ordered Lab-work, Procedure(s), Referral(s), & Consult(s): Orders Placed This Encounter  Procedures  . DG Lumbar Spine Complete W/Bend  . DG Cervical Spine With Flex & Extend  . DG Shoulder Right  . DG Shoulder Left  . Compliance Drug Analysis, Ur  . Comp. Metabolic Panel (12)  . Magnesium  . Vitamin B12  . Sedimentation rate  . 25-Hydroxyvitamin D Lcms D2+D3  . C-reactive protein  . Ambulatory referral to Psychology   Pharmacotherapy: Medications ordered:  No orders of the defined types were placed in this encounter.  Medications administered during this visit: Gari L. Brechtel had no medications administered during this visit.   Pharmacotherapy under consideration:  Opioid Analgesics: The patient was informed that there is no guarantee that he would be a candidate for opioid analgesics. The decision will be made following CDC guidelines. This decision will be based on the results of diagnostic studies, as well as Mr. Lukas risk profile. He admits that his UDS was negative  with medications so opioid therapy. By Dr. Lubertha Sayres office Membrane stabilizer: To be determined at a later time Muscle relaxant: To be determined at a later time NSAID: To be determined at a later time Other analgesic(s): To be determined at a later time   Interventional therapies under consideration: Mr. Requena was informed that there is no guarantee that he would be a candidate for interventional therapies. The decision will be based on the results of diagnostic studies, as well as Mr. Romey risk profile.  Possible procedure(s): Diagnostic bilateral lumbar facet nerve block Possible bilateral lumbar facet RFA Diagnostic bilateral cervical facet nerve block Possible bilateral cervical facet RFA Diagnostic bilateral intra-articular shoulder injections Diagnostic bilateral suprascapular nerve block Possible bilateral suprascapular nerve RFA   Provider-requested follow-up: Return for 2nd Visit, w/ Dr. Dossie Arbour, after [facility] eval.  Future Appointments  Date Time Provider [place]  10/23/2017 10:00 AM [facility] None  10/25/2017 10:00 AM [facility] None  10/28/2017 10:00 AM [facility] None  10/30/2017 10:00 AM [facility] None  11/01/2017 10:00 AM [facility] None  11/04/2017 10:00 AM [facility] None  11/06/2017 10:00 AM [facility] None  11/08/2017 10:00 AM [facility] None  11/11/2017 10:00 AM [facility] None  11/13/2017 10:00 AM [facility] None  11/15/2017 10:00 AM [facility] None  11/18/2017 10:00 AM [facility] None  11/20/2017 10:00 AM [facility] None  11/22/2017 10:00 AM [facility] None  11/25/2017 10:00 AM  [facility] None  11/27/2017 10:00 AM [facility] None  11/29/2017 10:00 AM [facility] None  12/02/2017 10:00 AM [facility] None  12/04/2017 10:00 AM [facility] None  12/06/2017 10:00 AM [facility] None  12/09/2017 10:00 AM [facility] None  12/11/2017 10:00 AM [facility] None  12/13/2017 10:00 AM [facility] None  12/16/2017 10:00 AM [facility] None  12/18/2017 10:00 AM [facility] None  12/20/2017 10:00 AM [facility] None  12/23/2017 10:00 AM [facility] None  12/25/2017 10:00 AM [facility] None  12/27/2017 10:00 AM [facility] None  12/30/2017 10:00 AM [facility] None    Primary Care Physician: Juluis Pitch, MD Location: [facility] Note by:  Date: 10/22/2017; Time: 9:34 AM  Pain Score Disclaimer: We use the NRS-11 scale. This is a self-reported, subjective measurement of pain severity with only modest accuracy. It is used primarily to identify changes within a particular patient. It must be understood that outpatient pain scales are significantly less accurate that those used for research, where they can be applied under ideal controlled circumstances with minimal exposure to variables. In reality, the score is likely to be a combination of pain intensity and pain affect, where pain affect describes the degree of emotional arousal or changes in action readiness caused by the sensory experience of pain. Factors such as social and work situation, setting, emotional state, anxiety levels, expectation, and prior pain experience may influence pain perception and  show large inter-individual differences that may also be affected by time variables.  Patient instructions provided during this appointment: Patient Instructions   ____________________________________________________________________________________________  Appointment Policy Summary  It is our goal and responsibility to provide the medical community with assistance in the evaluation and management of patients with chronic pain. Unfortunately our resources are limited. Because we do not have an unlimited amount of time, or available appointments, we are required to closely monitor and manage their use. The following rules exist to maximize their use:  Patient's responsibilities: 1. Punctuality:  At what time should I arrive? You should be physically present in our office 30 minutes before your scheduled appointment. Your scheduled appointment is with your assigned healthcare provider. However, it takes 5-10 minutes to be "checked-in", and another 15 minutes for the nurses to do the admission. If you arrive to our  office at the time you were given for your appointment, you will end up being at least 20-25 minutes late to your appointment with the provider. 2. Tardiness:  What happens if I arrive only a few minutes after my scheduled appointment time? You will need to reschedule your appointment. The cutoff is your appointment time. This is why it is so important that you arrive at least 30 minutes before that appointment. If you have an appointment scheduled for 10:00 AM and you arrive at 10:01, you will be required to reschedule your appointment.  3. Plan ahead:  Always assume that you will encounter traffic on your way in. Plan for it. If you are dependent on a driver, make sure they understand these rules and the need to arrive early. 4. Other appointments and responsibilities:  Avoid scheduling any other appointments before or after your pain clinic appointments.  5. Be prepared:  Write down  everything that you need to discuss with your healthcare provider and give this information to the admitting nurse. Write down the medications that you will need refilled. Bring your pills and bottles (even the empty ones), to all of your appointments, except for those where a procedure is scheduled. 6. No children or pets:  Find someone to take care of them. It is not appropriate to bring them in. 7. Scheduling changes:  We request "advanced notification" of any changes or cancellations. 8. Advanced notification:  Defined as a time period of more than 24 hours prior to the originally scheduled appointment. This allows for the appointment to be offered to other patients. 9. Rescheduling:  When a visit is rescheduled, it will require the cancellation of the original appointment. For this reason they both fall within the category of "Cancellations".  10. Cancellations:  They require advanced notification. Any cancellation less than 24 hours before the  appointment will be recorded as a "No Show". 11. No Show:  Defined as an unkept appointment where the patient failed to notify or declare to the practice their intention or inability to keep the appointment.  Corrective process for repeat offenders:  1. Tardiness: Three (3) episodes of rescheduling due to late arrivals will be recorded as one (1) "No Show". 2. Cancellation or reschedule: Three (3) cancellations or rescheduling will be recorded as one (1) "No Show". 3. "No Shows": Three (3) "No Shows" within a 12 month period will result in discharge from the practice. ____________________________________________________________________________________________  ____________________________________________________________________________________________  Pain Scale  Introduction: The pain score used by this practice is the Verbal Numerical Rating Scale (VNRS-11). This is an 11-point scale. It is for adults and children 10 years or older. There are  significant differences in how the pain score is reported, used, and applied. Forget everything you learned in the past and learn this scoring system.  General Information: The scale should reflect your current level of pain. Unless you are specifically asked for the level of your worst pain, or your average pain. If you are asked for one of these two, then it should be understood that it is over the past 24 hours.  Basic Activities of Daily Living (ADL): Personal hygiene, dressing, eating, transferring, and using restroom.  Instructions: Most patients tend to report their level of pain as a combination of two factors, their physical pain and their psychosocial pain. This last one is also known as "suffering" and it is reflection of how physical pain affects you socially and psychologically. From now on, report them separately. From this point on,  when asked to report your pain level, report only your physical pain. Use the following table for reference.  Pain Clinic Pain Levels (0-5/10)  Pain Level Score  Description  No Pain 0   Mild pain 1 Nagging, annoying, but does not interfere with basic activities of daily living (ADL). Patients are able to eat, bathe, get dressed, toileting (being able to get on and off the toilet and perform personal hygiene functions), transfer (move in and out of bed or a chair without assistance), and maintain continence (able to control bladder and bowel functions). Blood pressure and heart rate are unaffected. A normal heart rate for a healthy adult ranges from 60 to 100 bpm (beats per minute).   Mild to moderate pain 2 Noticeable and distracting. Impossible to hide from other people. More frequent flare-ups. Still possible to adapt and function close to normal. It can be very annoying and may have occasional stronger flare-ups. With discipline, patients may get used to it and adapt.   Moderate pain 3 Interferes significantly with activities of daily living (ADL). It  becomes difficult to feed, bathe, get dressed, get on and off the toilet or to perform personal hygiene functions. Difficult to get in and out of bed or a chair without assistance. Very distracting. With effort, it can be ignored when deeply involved in activities.   Moderately severe pain 4 Impossible to ignore for more than a few minutes. With effort, patients may still be able to manage work or participate in some social activities. Very difficult to concentrate. Signs of autonomic nervous system discharge are evident: dilated pupils (mydriasis); mild sweating (diaphoresis); sleep interference. Heart rate becomes elevated (>115 bpm). Diastolic blood pressure (lower number) rises above 100 mmHg. Patients find relief in laying down and not moving.   Severe pain 5 Intense and extremely unpleasant. Associated with frowning face and frequent crying. Pain overwhelms the senses.  Ability to do any activity or maintain social relationships becomes significantly limited. Conversation becomes difficult. Pacing back and forth is common, as getting into a comfortable position is nearly impossible. Pain wakes you up from deep sleep. Physical signs will be obvious: pupillary dilation; increased sweating; goosebumps; brisk reflexes; cold, clammy hands and feet; nausea, vomiting or dry heaves; loss of appetite; significant sleep disturbance with inability to fall asleep or to remain asleep. When persistent, significant weight loss is observed due to the complete loss of appetite and sleep deprivation.  Blood pressure and heart rate becomes significantly elevated. Caution: If elevated blood pressure triggers a pounding headache associated with blurred vision, then the patient should immediately seek attention at an urgent or emergency care unit, as these may be signs of an impending stroke.    Emergency Department Pain Levels (6-10/10)  Emergency Room Pain 6 Severely limiting. Requires emergency care and should not be  seen or managed at an outpatient pain management facility. Communication becomes difficult and requires great effort. Assistance to reach the emergency department may be required. Facial flushing and profuse sweating along with potentially dangerous increases in heart rate and blood pressure will be evident.   Distressing pain 7 Self-care is very difficult. Assistance is required to transport, or use restroom. Assistance to reach the emergency department will be required. Tasks requiring coordination, such as bathing and getting dressed become very difficult.   Disabling pain 8 Self-care is no longer possible. At this level, pain is disabling. The individual is unable to do even the most "basic" activities such as walking, eating, bathing, dressing,  transferring to a bed, or toileting. Fine motor skills are lost. It is difficult to think clearly.   Incapacitating pain 9 Pain becomes incapacitating. Thought processing is no longer possible. Difficult to remember your own name. Control of movement and coordination are lost.   The worst pain imaginable 10 At this level, most patients pass out from pain. When this level is reached, collapse of the autonomic nervous system occurs, leading to a sudden drop in blood pressure and heart rate. This in turn results in a temporary and dramatic drop in blood flow to the brain, leading to a loss of consciousness. Fainting is one of the body's self defense mechanisms. Passing out puts the brain in a calmed state and causes it to shut down for a while, in order to begin the healing process.    Summary: 1. Refer to this scale when providing [place] with your pain level. 2. Be accurate and careful when reporting your pain level. This will help with your care. 3. Over-reporting your pain level will lead to loss of credibility. 4. Even a level of 1/10 means that there is pain and will be treated at our facility. 5. High, inaccurate reporting will be documented as "Symptom  Exaggeration", leading to loss of credibility and suspicions of possible secondary gains such as obtaining more narcotics, or wanting to appear disabled, for fraudulent reasons. 6. Only pain levels of 5 or below will be seen at our facility. 7. Pain levels of 6 and above will be sent to the Emergency Department and the appointment cancelled. ____________________________________________________________________________________________

## 2017-10-23 ENCOUNTER — Ambulatory Visit
Admission: RE | Admit: 2017-10-23 | Discharge: 2017-10-23 | Disposition: A | Discharge status: Home / Self Care | Payer: Medicare Other | Source: Ambulatory Visit | Attending: Nurse Practitioner | Admitting: Nurse Practitioner

## 2017-10-23 ENCOUNTER — Other Ambulatory Visit: Payer: Self-pay | Admitting: Nurse Practitioner

## 2017-10-23 ENCOUNTER — Encounter: Payer: Self-pay | Admitting: Nurse Practitioner

## 2017-10-23 DIAGNOSIS — M542 Cervicalgia: Secondary | ICD-10-CM

## 2017-10-23 DIAGNOSIS — M25512 Pain in left shoulder: Secondary | ICD-10-CM | POA: Diagnosis present

## 2017-10-23 DIAGNOSIS — M25511 Pain in right shoulder: Secondary | ICD-10-CM

## 2017-10-23 DIAGNOSIS — G8929 Other chronic pain: Secondary | ICD-10-CM

## 2017-10-23 DIAGNOSIS — M50322 Other cervical disc degeneration at C5-C6 level: Secondary | ICD-10-CM | POA: Insufficient documentation

## 2017-10-23 DIAGNOSIS — R7982 Elevated C-reactive protein (CRP): Secondary | ICD-10-CM | POA: Insufficient documentation

## 2017-10-23 DIAGNOSIS — M545 Low back pain: Principal | ICD-10-CM

## 2017-10-23 DIAGNOSIS — R700 Elevated erythrocyte sedimentation rate: Secondary | ICD-10-CM

## 2017-10-23 DIAGNOSIS — J449 Chronic obstructive pulmonary disease, unspecified: Secondary | ICD-10-CM | POA: Diagnosis not present

## 2017-10-23 NOTE — Progress Notes (Signed)
Daily Session Note  Patient Details  Name: William Bray MRN: [id number] Date of Birth: 1950-11-26 Referring Provider:     Pulmonary Rehab from 09/30/2017 in [facility]  Referring Provider  [name] MD      Encounter Date: 10/23/2017  Check In: Session Check In - 10/23/17 1126      Check-In   Location  [facility]    Staff Present  Justin Mend Lorre Nick, MA, RCEP, CCRP, Exercise Physiologist;Amanda Oletta Darter, [place], ACSM CEP, Exercise Physiologist    Supervising physician immediately available to respond to emergencies  [name] immediately available ER MD    Physician(s)  Dr. Quentin Cornwall and Joni Fears    Medication changes reported      No    Fall or balance concerns reported     No    Tobacco Cessation  No Change    Warm-up and Cool-down  Performed as group-led instruction    Resistance Training Performed  Yes    VAD Patient?  Yes      VAD patient   Has back up controller?  Yes    Has spare charged batteries?  Yes    Has battery cables?  Yes    Has compatible battery clips?  Yes      Pain Assessment   Currently in Pain?  No/denies          Social History   Tobacco Use  Smoking Status Former Smoker  . Packs/day: 1.00  . Years: 45.00  . Pack years: 45.00  . Types: Cigarettes  . Last attempt to quit: 05/28/2005  . Years since quitting: 12.4  Smokeless Tobacco Never Used    Goals Met:  Independence with exercise equipment Exercise tolerated well No report of cardiac concerns or symptoms Strength training completed today  Goals Unmet:  Not Applicable  Comments: Pt able to follow exercise prescription today without complaint.  Will continue to monitor for progression.   Dr. Emily Filbert is Medical Director for [place] and [name].

## 2017-10-24 NOTE — Progress Notes (Signed)
Results were reviewed and found to be: mildly abnormal  No acute injury or pathology identified  Review would suggest interventional pain management techniques may be of benefit

## 2017-10-25 ENCOUNTER — Encounter: Payer: Medicare Other | Admitting: *Deleted

## 2017-10-25 DIAGNOSIS — Z95811 Presence of heart assist device: Secondary | ICD-10-CM

## 2017-10-25 DIAGNOSIS — J449 Chronic obstructive pulmonary disease, unspecified: Secondary | ICD-10-CM | POA: Diagnosis not present

## 2017-10-25 DIAGNOSIS — I5022 Chronic systolic (congestive) heart failure: Secondary | ICD-10-CM

## 2017-10-25 NOTE — Progress Notes (Signed)
Daily Session Note  Patient Details  Name: William Bray MRN: [id number] Date of Birth: 1950-12-26 Referring Provider:     Pulmonary Rehab from 09/30/2017 in [facility]  Referring Provider  [name] MD      Encounter Date: 10/25/2017  Check In: Session Check In - 10/25/17 1015      Check-In   Location  [facility]    Staff Present  Joellyn Rued, BS, PEC;Meredith Gargatha, RN Vickki Hearing, BA, ACSM CEP, Exercise Physiologist    Supervising physician immediately available to respond to emergencies  [name] immediately available ER MD    Physician(s)  Dr. Kerman Passey and Corky Downs    Medication changes reported      No    Fall or balance concerns reported     No    Tobacco Cessation  No Change    Warm-up and Cool-down  Performed as group-led instruction    Resistance Training Performed  Yes    VAD Patient?  Yes      VAD patient   Has back up controller?  Yes    Has spare charged batteries?  Yes    Has battery cables?  Yes    Has compatible battery clips?  Yes      Pain Assessment   Currently in Pain?  No/denies          Social History   Tobacco Use  Smoking Status Former Smoker  . Packs/day: 1.00  . Years: 45.00  . Pack years: 45.00  . Types: Cigarettes  . Last attempt to quit: 05/28/2005  . Years since quitting: 12.4  Smokeless Tobacco Never Used    Goals Met:  Proper associated with RPD/PD & O2 Sat Independence with exercise equipment Using PLB without cueing & demonstrates good technique Exercise tolerated well No report of cardiac concerns or symptoms Strength training completed today  Goals Unmet:  Not Applicable  Comments: Pt able to follow exercise prescription today without complaint.  Will continue to monitor for progression.    Dr. Emily Filbert is Medical Director for [place] and [name].

## 2017-10-26 LAB — COMP. METABOLIC PANEL (12)
A/G RATIO: 1.6 (ref 1.2–2.2)
AST: 26 IU/L (ref 0–40)
Albumin: 4.2 g/dL (ref 3.6–4.8)
Alkaline Phosphatase: 66 IU/L (ref 39–117)
BILIRUBIN TOTAL: 0.3 mg/dL (ref 0.0–1.2)
BUN / CREAT RATIO: 21 (ref 10–24)
BUN: 25 mg/dL (ref 8–27)
CHLORIDE: 92 mmol/L — AB (ref 96–106)
Calcium: 9.5 mg/dL (ref 8.6–10.2)
Creatinine, Ser: 1.2 mg/dL (ref 0.76–1.27)
GFR calc Af Amer: 72 mL/min/{1.73_m2} (ref >59)
GFR calc non Af Amer: 62 mL/min/{1.73_m2} (ref >59)
GLOBULIN, TOTAL: 2.7 g/dL (ref 1.5–4.5)
Glucose: 81 mg/dL (ref 65–99)
POTASSIUM: 5.1 mmol/L (ref 3.5–5.2)
SODIUM: 132 mmol/L — AB (ref 134–144)
TOTAL PROTEIN: 6.9 g/dL (ref 6.0–8.5)

## 2017-10-26 LAB — MAGNESIUM: MAGNESIUM: 2 mg/dL (ref 1.6–2.3)

## 2017-10-26 LAB — SEDIMENTATION RATE: Sed Rate: 32 mm/hr — ABNORMAL HIGH (ref 0–30)

## 2017-10-26 LAB — 25-HYDROXY VITAMIN D LCMS D2+D3
25-Hydroxy, Vitamin D-2: <1 ng/mL
25-Hydroxy, Vitamin D-3: 35 ng/mL

## 2017-10-26 LAB — 25-HYDROXYVITAMIN D LCMS D2+D3: 25-HYDROXY, VITAMIN D: 35 ng/mL

## 2017-10-26 LAB — VITAMIN B12: Vitamin B-12: 1565 pg/mL — ABNORMAL HIGH (ref 232–1245)

## 2017-10-26 LAB — C-REACTIVE PROTEIN: CRP: 5.9 mg/L — AB (ref 0.0–4.9)

## 2017-10-28 ENCOUNTER — Telehealth: Payer: Self-pay

## 2017-10-28 ENCOUNTER — Encounter: Payer: Medicare Other | Attending: Pulmonary Disease

## 2017-10-28 DIAGNOSIS — J449 Chronic obstructive pulmonary disease, unspecified: Secondary | ICD-10-CM

## 2017-10-28 DIAGNOSIS — E119 Type 2 diabetes mellitus without complications: Secondary | ICD-10-CM | POA: Insufficient documentation

## 2017-10-28 DIAGNOSIS — Z7982 Long term (current) use of aspirin: Secondary | ICD-10-CM | POA: Insufficient documentation

## 2017-10-28 DIAGNOSIS — Z7901 Long term (current) use of anticoagulants: Secondary | ICD-10-CM | POA: Insufficient documentation

## 2017-10-28 DIAGNOSIS — Z87891 Personal history of nicotine dependence: Secondary | ICD-10-CM | POA: Insufficient documentation

## 2017-10-28 DIAGNOSIS — Z79899 Other long term (current) drug therapy: Secondary | ICD-10-CM | POA: Insufficient documentation

## 2017-10-28 DIAGNOSIS — I10 Essential (primary) hypertension: Secondary | ICD-10-CM | POA: Insufficient documentation

## 2017-10-28 DIAGNOSIS — Z79891 Long term (current) use of opiate analgesic: Secondary | ICD-10-CM | POA: Insufficient documentation

## 2017-10-28 NOTE — Progress Notes (Signed)
Pulmonary Individual Treatment Plan  Patient Details  Name: William Bray MRN: [id number] Date of Birth: 1950-08-02 Referring Provider:     Pulmonary Rehab from 09/30/2017 in [facility] Cardiac and Pulmonary Rehab  Referring Provider  [name] MD      Initial Encounter Date:    Pulmonary Rehab from 09/30/2017 in [facility] Cardiac and Pulmonary Rehab  Date  09/30/17  Referring Provider  [name] MD      Visit Diagnosis: Chronic obstructive pulmonary disease, unspecified COPD type ([place])  Patient's Home Medications on Admission:  Current Outpatient Medications:  .  albuterol (PROVENTIL) (2.5 MG/3ML) 0.083% nebulizer solution, Inhale 3 mLs into the lungs every 6 (six) hours as needed., Disp: , Rfl:  .  allopurinol (ZYLOPRIM) 100 MG tablet, Take 100 mg by mouth daily., Disp: , Rfl:  .  ALPRAZolam (XANAX) 0.5 MG tablet, Take 0.5 mg by mouth 2 (two) times daily as needed. ONE IN THE MORNING AND ONE AT BEDTIME PRN, Disp: , Rfl:  .  aspirin EC 81 MG tablet, Take 81 mg by mouth daily., Disp: , Rfl:  .  budesonide (PULMICORT) 0.5 MG/2ML nebulizer solution, Inhale 2 mLs into the lungs 2 (two) times daily., Disp: , Rfl:  .  cetirizine (ZYRTEC) 10 MG tablet, Take 10 mg by mouth daily., Disp: , Rfl:  .  cyanocobalamin 100 MCG tablet, Take 100 mcg by mouth daily., Disp: , Rfl:  .  ferrous sulfate 324 (65 Fe) MG TBEC, Take 324 mg by mouth every morning. WITH BREAKFAST, Disp: , Rfl:  .  FLUoxetine (PROZAC) 20 MG capsule, Take 80 mg by mouth daily., Disp: , Rfl:  .  gabapentin (NEURONTIN) 300 MG capsule, Take 600 mg by mouth 3 (three) times daily. , Disp: , Rfl:  .  lisinopril (PRINIVIL,ZESTRIL) 2.5 MG tablet, Take 2.5 mg by mouth daily., Disp: , Rfl:  .  magnesium oxide (MAG-OX) 400 MG tablet, Take 400 mg by mouth daily., Disp: , Rfl:  .  metoprolol tartrate (LOPRESSOR) 25 MG tablet, Take 25 mg by mouth 2 (two) times daily., Disp: , Rfl:  .  mirtazapine (REMERON) 30 MG tablet, Take 0.5 tablets by mouth  at bedtime., Disp: , Rfl:  .  montelukast (SINGULAIR) 10 MG tablet, Take 10 mg by mouth at bedtime., Disp: , Rfl:  .  Multiple Vitamins-Minerals (MULTIVITAMIN WITH MINERALS) tablet, Take 1 tablet by mouth daily., Disp: , Rfl:  .  ondansetron (ZOFRAN) 4 MG tablet, Take 4 mg by mouth every 8 (eight) hours as needed for nausea., Disp: , Rfl:  .  pantoprazole (PROTONIX) 40 MG tablet, Take 40 mg by mouth daily., Disp: , Rfl:  .  pravastatin (PRAVACHOL) 40 MG tablet, Take 40 mg by mouth at bedtime., Disp: , Rfl:  .  spironolactone (ALDACTONE) 25 MG tablet, Take 25 mg by mouth daily., Disp: , Rfl:  .  torsemide (DEMADEX) 20 MG tablet, Take 2 tablets by mouth daily., Disp: , Rfl:  .  umeclidinium-vilanterol (ANORO ELLIPTA) 62.5-25 MCG/INH AEPB, Inhale 1 puff into the lungs daily., Disp: , Rfl:  .  warfarin (COUMADIN) 3 MG tablet, Take 3 mg by mouth 3 (three) times a week., Disp: , Rfl:  .  warfarin (COUMADIN) 4 MG tablet, Take 4 mg by mouth 4 (four) times a week., Disp: , Rfl:  .  warfarin (COUMADIN) 5 MG tablet, Take 5 mg by mouth as directed. Taking 5 mg M, W, F and 4 mg T, Th, Sat, Sun., Disp: , Rfl:  Past Medical History: Past Medical History:  Diagnosis Date  . Arthritis   . Chronic back pain   . Diabetes mellitus without complication ([place])   . Hypertension   . Neuropathy     Tobacco Use: Social History   Tobacco Use  Smoking Status Former Smoker  . Packs/day: 1.00  . Years: 45.00  . Pack years: 45.00  . Types: Cigarettes  . Last attempt to quit: 05/28/2005  . Years since quitting: 12.4  Smokeless Tobacco Never Used    Labs: Recent Review Flowsheet Data    Labs for ITP Cardiac and Pulmonary Rehab Latest Ref Rng & Units 08/08/2012 04/28/2013 11/05/2013 05/30/2014 06/02/2014   Cholestrol 0 - 200 mg/dL 105 - 127 64 -   LDLCALC 0 - 100 mg/dL 37 - 74 27 -   HDL 40 - 60 mg/dL 33(L) - 33(L) 15(L) -   Trlycerides 0 - 200 mg/dL 173 - 99 108 -   Hemoglobin A1c 4.2 - 6.3 % - 5.9 - - 5.3        Pulmonary Assessment Scores: Pulmonary Assessment Scores    Row Name 09/30/17 1428         ADL UCSD   ADL Phase  Entry     SOB Score total  73     Rest  1     Walk  2     Stairs  5     Bath  2     Dress  3     Shop  5       CAT Score   CAT Score  22       mMRC Score   mMRC Score  3        Pulmonary Function Assessment: Pulmonary Function Assessment - 09/30/17 1429      Initial Spirometry Results   FVC%  54 %    FEV1%  29 %    FEV1/FVC Ratio  39.67    Comments  test done on 08/28/16      Post Bronchodilator Spirometry Results   FVC%  64 %    FEV1%  37 %    FEV1/FVC Ratio  38.57    Comments  test done on 08/28/16      Breath   Shortness of Breath  Yes;Limiting activity;Fear of Shortness of Breath       Exercise Target Goals:    Exercise Program Goal: Individual exercise prescription set using results from initial 6 min walk test and THRR while considering  patient's activity barriers and safety.    Exercise Prescription Goal: Initial exercise prescription builds to 30-45 minutes a day of aerobic activity, 2-3 days per week.  Home exercise guidelines will be given to patient during program as part of exercise prescription that the participant will acknowledge.  Activity Barriers & Risk Stratification: Activity Barriers & Cardiac Risk Stratification - 09/30/17 1555      Activity Barriers & Cardiac Risk Stratification   Activity Barriers  Deconditioning;Muscular Weakness;Shortness of Breath;Decreased Ventricular Function;Balance Concerns;Back Problems chronic back pain       6 Minute Walk: 6 Minute Walk    Row Name 09/30/17 1550         6 Minute Walk   Phase  Initial     Distance  625 feet     Walk Time  3.9 minutes     # of Rest Breaks  4 15 sec, 44 sec, 49 sec, 20 sec     MPH  1.82  METS  2     RPE  17     Perceived Dyspnea   3     VO2 Peak  6.99     Symptoms  Yes (comment)     Comments  SOB, back pain 8/10     Resting HR  78 bpm      Resting BP  - 80 dopplar     Resting Oxygen Saturation   96 %     Exercise Oxygen Saturation  during 6 min walk  83 %     Max Ex. HR  121 bpm     Max Ex. BP  - 112 dopplar     2 Minute Post BP  - 114 dopplar, rck 106       Interval HR   4 Minute HR  117     5 Minute HR  121     6 Minute HR  117     2 Minute Post HR  103     Interval Heart Rate?  Yes       Interval Oxygen   Interval Oxygen?  Yes     Baseline Oxygen Saturation %  96 %     1 Minute Liters of Oxygen  4 L pulsed     2 Minute Liters of Oxygen  4 L     3 Minute Liters of Oxygen  4 L     4 Minute Oxygen Saturation %  86 %     4 Minute Liters of Oxygen  5 L pt increased himself     5 Minute Oxygen Saturation %  83 %     5 Minute Liters of Oxygen  5 L     6 Minute Oxygen Saturation %  89 % 85% once seated     6 Minute Liters of Oxygen  5 L     2 Minute Post Oxygen Saturation %  90 %     2 Minute Post Liters of Oxygen  4 L       Oxygen Initial Assessment: Oxygen Initial Assessment - 09/30/17 1436      Home Oxygen   Home Oxygen Device  Home Concentrator;Portable Concentrator;E-Tanks    Sleep Oxygen Prescription  Continuous;CPAP    Liters per minute  2    Home Exercise Oxygen Prescription  Continuous    Liters per minute  2    Home at Rest Exercise Oxygen Prescription  Continuous    Liters per minute  2    Compliance with Home Oxygen Use  Yes      Initial 6 min Walk   Oxygen Used  Portable Concentrator;Pulsed    Liters per minute  3      Program Oxygen Prescription   Program Oxygen Prescription  Continuous;E-Tanks    Liters per minute  3      Intervention   Short Term Goals  To learn and exhibit compliance with exercise, home and travel O2 prescription;To learn and understand importance of maintaining oxygen saturations>88%;To learn and demonstrate proper use of respiratory medications;To learn and demonstrate proper pursed lip breathing techniques or other breathing techniques.;To learn and understand  importance of monitoring SPO2 with pulse oximeter and demonstrate accurate use of the pulse oximeter.    Long  Term Goals  Exhibits compliance with exercise, home and travel O2 prescription;Verbalizes importance of monitoring SPO2 with pulse oximeter and return demonstration;Maintenance of O2 saturations>88%;Exhibits proper breathing techniques, such as pursed lip breathing or other method taught during program session;Compliance  with respiratory medication;Demonstrates proper use of MDI's       Oxygen Re-Evaluation: Oxygen Re-Evaluation    Row Name 10/04/17 1048             Goals/Expected Outcomes   Short Term Goals  To learn and exhibit compliance with exercise, home and travel O2 prescription;To learn and understand importance of maintaining oxygen saturations>88%;To learn and demonstrate proper use of respiratory medications;To learn and demonstrate proper pursed lip breathing techniques or other breathing techniques.;To learn and understand importance of monitoring SPO2 with pulse oximeter and demonstrate accurate use of the pulse oximeter.       Long  Term Goals  Exhibits compliance with exercise, home and travel O2 prescription;Verbalizes importance of monitoring SPO2 with pulse oximeter and return demonstration;Maintenance of O2 saturations>88%;Exhibits proper breathing techniques, such as pursed lip breathing or other method taught during program session;Compliance with respiratory medication;Demonstrates proper use of MDI's       Comments  Reviewed PLB technique with pt.  Talked about how it work and it's important to maintaining his exercise saturations.       Goals/Expected Outcomes  Short: Become more profiecient at using PLB.   Long: Become independent at using PLB.          Oxygen Discharge (Final Oxygen Re-Evaluation): Oxygen Re-Evaluation - 10/04/17 1048      Goals/Expected Outcomes   Short Term Goals  To learn and exhibit compliance with exercise, home and travel O2  prescription;To learn and understand importance of maintaining oxygen saturations>88%;To learn and demonstrate proper use of respiratory medications;To learn and demonstrate proper pursed lip breathing techniques or other breathing techniques.;To learn and understand importance of monitoring SPO2 with pulse oximeter and demonstrate accurate use of the pulse oximeter.    Long  Term Goals  Exhibits compliance with exercise, home and travel O2 prescription;Verbalizes importance of monitoring SPO2 with pulse oximeter and return demonstration;Maintenance of O2 saturations>88%;Exhibits proper breathing techniques, such as pursed lip breathing or other method taught during program session;Compliance with respiratory medication;Demonstrates proper use of MDI's    Comments  Reviewed PLB technique with pt.  Talked about how it work and it's important to maintaining his exercise saturations.    Goals/Expected Outcomes  Short: Become more profiecient at using PLB.   Long: Become independent at using PLB.       Initial Exercise Prescription: Initial Exercise Prescription - 09/30/17 1500      Date of Initial Exercise RX and Referring Provider   Date  09/30/17    Referring Provider  [name] MD      Oxygen   Oxygen  Continuous    Liters  4      Treadmill   MPH  1.3    Grade  0    Minutes  15    METs  2      NuStep   Level  2    SPM  80    Minutes  15    METs  2      REL-XR   Level  1    Speed  50    Minutes  15    METs  2      Prescription Details   Frequency (times per week)  3    Duration  Progress to 45 minutes of aerobic exercise without signs/symptoms of physical distress      Intensity   THRR 40-80% of Max Heartrate  108-138    Ratings of Perceived Exertion  11-13  Perceived Dyspnea  0-4      Progression   Progression  Continue to progress workloads to maintain intensity without signs/symptoms of physical distress.      Resistance Training   Training Prescription   Yes    Weight  4 lbs    Reps  10-15       Perform Capillary Blood Glucose checks as needed.  Exercise Prescription Changes: Exercise Prescription Changes    Row Name 09/30/17 1500 10/15/17 1200           Response to Exercise   Blood Pressure (Admit)  - 80 dopplar  - 84      Blood Pressure (Exercise)  - 112 dopplar  -      Blood Pressure (Exit)  - 114 dopplar, 106 dopplar recheck  - 84      Heart Rate (Admit)  78 bpm  84 bpm      Heart Rate (Exercise)  121 bpm  101 bpm      Heart Rate (Exit)  90 bpm  -      Oxygen Saturation (Admit)  96 %  89 %      Oxygen Saturation (Exercise)  83 %  97 %      Oxygen Saturation (Exit)  90 %  97 %      Rating of Perceived Exertion (Exercise)  17  12      Perceived Dyspnea (Exercise)  3  2      Symptoms  back pain 8/10, SOB  -      Comments  walk test results  -      Duration  -  Progress to 45 minutes of aerobic exercise without signs/symptoms of physical distress      Intensity  -  THRR unchanged        Progression   Progression  -  Continue to progress workloads to maintain intensity without signs/symptoms of physical distress.        Resistance Training   Training Prescription  -  Yes      Weight  -  3 lb      Reps  -  10-15        Oxygen   Oxygen  -  Continuous      Liters  -  3        NuStep   Level  -  1      SPM  -  80      Minutes  -  15         Exercise Comments: Exercise Comments    Row Name 10/04/17 1047           Exercise Comments   First full day of exercise!  Patient was oriented to gym and equipment including functions, settings, policies, and procedures.  Patient's individual exercise prescription and treatment plan were reviewed.  All starting workloads were established based on the results of the 6 minute walk test done at initial orientation visit.  The plan for exercise progression was also introduced and progression will be customized based on patient's performance and goals          Exercise Goals and  Review: Exercise Goals    Row Name 09/30/17 1600             Exercise Goals   Increase Physical Activity  Yes       Intervention  Provide advice, education, support and counseling about physical activity/exercise needs.;Develop an individualized exercise prescription for aerobic  and resistive training based on initial evaluation findings, risk stratification, comorbidities and participant's personal goals.       Expected Outcomes  Short Term: Attend rehab on a regular basis to increase amount of physical activity.;Long Term: Add in home exercise to make exercise part of routine and to increase amount of physical activity.;Long Term: Exercising regularly at least 3-5 days a week.       Increase Strength and Stamina  Yes       Intervention  Provide advice, education, support and counseling about physical activity/exercise needs.;Develop an individualized exercise prescription for aerobic and resistive training based on initial evaluation findings, risk stratification, comorbidities and participant's personal goals.       Expected Outcomes  Short Term: Increase workloads from initial exercise prescription for resistance, speed, and METs.;Short Term: Perform resistance training exercises routinely during rehab and add in resistance training at home;Long Term: Improve cardiorespiratory fitness, muscular endurance and strength as measured by increased METs and functional capacity (6MWT)       Able to understand and use rate of perceived exertion (RPE) scale  Yes       Intervention  Provide education and explanation on how to use RPE scale       Expected Outcomes  Long Term:  Able to use RPE to guide intensity level when exercising independently;Short Term: Able to use RPE daily in rehab to express subjective intensity level       Able to understand and use Dyspnea scale  Yes       Intervention  Provide education and explanation on how to use Dyspnea scale       Expected Outcomes  Long Term: Able to use  Dyspnea scale to guide intensity level when exercising independently;Short Term: Able to use Dyspnea scale daily in rehab to express subjective sense of shortness of breath during exertion       Knowledge and understanding of Target Heart Rate Range (THRR)  Yes       Intervention  Provide education and explanation of THRR including how the numbers were predicted and where they are located for reference       Expected Outcomes  Short Term: Able to state/look up THRR;Short Term: Able to use daily as guideline for intensity in rehab;Long Term: Able to use THRR to govern intensity when exercising independently       Able to check pulse independently  Yes       Intervention  Provide education and demonstration on how to check pulse in carotid and radial arteries.;Review the importance of being able to check your own pulse for safety during independent exercise       Expected Outcomes  Short Term: Able to explain why pulse checking is important during independent exercise;Long Term: Able to check pulse independently and accurately       Understanding of Exercise Prescription  Yes       Intervention  Provide education, explanation, and written materials on patient's individual exercise prescription       Expected Outcomes  Short Term: Able to explain program exercise prescription;Long Term: Able to explain home exercise prescription to exercise independently          Exercise Goals Re-Evaluation : Exercise Goals Re-Evaluation    William Bray Name 10/04/17 1048 10/15/17 1252           Exercise Goal Re-Evaluation   Exercise Goals Review  Increase Physical Activity;Increase Strength and Stamina;Able to understand and use rate of perceived exertion (RPE) scale;Able to  understand and use Dyspnea scale;Knowledge and understanding of Target Heart Rate Range (THRR);Understanding of Exercise Prescription  Increase Physical Activity;Able to understand and use rate of perceived exertion (RPE) scale;Increase Strength and  Stamina;Able to understand and use Dyspnea scale      Comments  Reviewed RPE scale, THR and program prescription with pt today.  Pt voiced understanding and was given a copy of goals to take home.   William Bray is just starting back to exercise.  He is tolerating exercise well and BG has been within acceptable range.        Expected Outcomes  Short: Use RPE daily to regulate intensity.  Long: Follow program prescription in THR.  Short - pt will attend class 3 days per week Long - Pt will improve MET level          Discharge Exercise Prescription (Final Exercise Prescription Changes): Exercise Prescription Changes - 10/15/17 1200      Response to Exercise   Blood Pressure (Admit)  -- 84    Blood Pressure (Exit)  -- 84    Heart Rate (Admit)  84 bpm    Heart Rate (Exercise)  101 bpm    Oxygen Saturation (Admit)  89 %    Oxygen Saturation (Exercise)  97 %    Oxygen Saturation (Exit)  97 %    Rating of Perceived Exertion (Exercise)  12    Perceived Dyspnea (Exercise)  2    Duration  Progress to 45 minutes of aerobic exercise without signs/symptoms of physical distress    Intensity  THRR unchanged      Progression   Progression  Continue to progress workloads to maintain intensity without signs/symptoms of physical distress.      Resistance Training   Training Prescription  Yes    Weight  3 lb    Reps  10-15      Oxygen   Oxygen  Continuous    Liters  3      NuStep   Level  1    SPM  80    Minutes  15       Nutrition:  Target Goals: Understanding of nutrition guidelines, daily intake of sodium <1538m, cholesterol <2016m calories 30% from fat and 7% or less from saturated fats, daily to have 5 or more servings of fruits and vegetables.  Biometrics: Pre Biometrics - 09/30/17 1601      Pre Biometrics   Height  5' 8.7" (1.745 m)    Weight  181 lb 4.8 oz (82.2 kg)    Waist Circumference  39 inches    Hip Circumference  38.5 inches    Waist to Hip Ratio  1.01 %    BMI (Calculated)   27.01    Single Leg Stand  5.15 seconds        Nutrition Therapy Plan and Nutrition Goals: Nutrition Therapy & Goals - 10/16/17 1021      Nutrition Therapy   Diet  DM / TLC    Drug/Food Interactions  Coumadin/Vit K    Protein (specify units)  12oz    Fiber  30 grams    Whole Grain Foods  3 servings    Saturated Fats  15 max. grams    Fruits and Vegetables  5 servings/day 8 ideal    Sodium  2000 grams      Personal Nutrition Goals   Nutrition Goal  Eat on a consistent schedule daily in order to stimulate appetite / hunger    Personal Goal #  2  If you continue to find yourself without an appetite, consuming a diet higher in fat can help you to consume more calories with a smaller volume of food    Comments  He would like to maintain his CBW but does not always feel hungry. He has had dietary education off and on since [place], educate and counsel regarding individualized specific dietary modifications aiming towards targeted core components such as weight, hypertension, lipid management, diabetes, heart failure and other comorbidities.    Expected Outcomes  Short Term Goal: Understand basic principles of dietary content, such as calories, fat, sodium, cholesterol and nutrients.;Short Term Goal: A plan has been developed with personal nutrition goals set during dietitian appointment.;Long Term Goal: Adherence to prescribed nutrition plan.       Nutrition Assessments: Nutrition Assessments - 09/30/17 1427      MEDFICTS Scores   Pre Score  30       Nutrition Goals Re-Evaluation: Nutrition Goals Re-Evaluation    [place] Name 10/16/17 1023 10/16/17 1024           Goals   Nutrition Goal  If you continue to find yourself without an appetite, consuming a diet higher in fat can help you to consume more calories with a smaller volume of food  -      Comment  He c/o decreased appetite  -      Expected Outcome  He will eat foods high in fat  around the same times each day in order to maximize his caloric intake  -        Personal Goal #2 Re-Evaluation   Personal Goal #2  -  Eat on a consistent schedule daily in order to stimulate hunger/ appetite         Nutrition Goals Discharge (Final Nutrition Goals Re-Evaluation): Nutrition Goals Re-Evaluation - 10/16/17 1024      Personal Goal #2 Re-Evaluation   Personal Goal #2  Eat on a consistent schedule daily in order to stimulate hunger/ appetite       Psychosocial: Target Goals: Acknowledge presence or absence of significant depression and/or stress, maximize coping skills, provide positive support system. Participant is able to verbalize types and ability to use techniques and skills needed for reducing stress and depression.   Initial Review & Psychosocial Screening: Initial Psych Review & Screening - 09/30/17 1425      Initial Review   Current issues with  History of Depression;Current Psychotropic Meds;Current Stress Concerns    Source of Stress Concerns  Chronic Illness;Unable to perform yard/household activities    Comments  He feels like his breathing is getting worse and wants to get better      [place]?  Yes    Comments  William Bray has great support from his wife, daughter, and son.       Barriers   Psychosocial barriers to participate in program  The patient should benefit from training in stress management and relaxation.      Screening Interventions   Interventions  Encouraged to exercise;Provide feedback about the scores to participant;Program counselor consult;To provide support and resources with identified psychosocial needs    Expected Outcomes  Short Term goal: Utilizing psychosocial counselor, staff and physician to assist with identification of specific Stressors or current issues interfering with healing process. Setting desired goal for each stressor or current issue identified.;Long Term Goal: Stressors or current issues  are controlled or eliminated.;Short Term goal: Identification and review with participant of any Quality of Life or Depression concerns found by scoring the questionnaire.;Long Term goal: The participant improves quality of Life and PHQ9 Scores as seen by post scores and/or verbalization of changes       Quality of Life Scores:  Scores of 19 and below usually indicate a poorer quality of life in these areas.  A difference of  2-3 points is a clinically meaningful difference.  A difference of 2-3 points in the total score of the Quality of Life Index has been associated with significant improvement in overall quality of life, self-image, physical symptoms, and general health in studies assessing change in quality of life.  PHQ-9: Recent Review Flowsheet Data    Depression screen [facility] 2/9 10/22/2017 09/30/2017 08/28/2016 01/02/2016   Decreased Interest 2 2 1  0   Down, Depressed, Hopeless 2 2 1 1    PHQ - 2 Score 4 4 2 1    Altered sleeping 2 2 1 2    Tired, decreased energy 2 2 1 1    Change in appetite 2 2 1  0   Feeling bad or failure about yourself  2 1 1 1    Trouble concentrating 2 1 1 1    Moving slowly or fidgety/restless 2 1 1 1    Suicidal thoughts 0 0 0 0   PHQ-9 Score 16 13 8 7    Difficult doing work/chores Somewhat difficult Very difficult Not difficult at all Somewhat difficult     Interpretation of Total Score  Total Score Depression Severity:  1-4 = Minimal depression, 5-9 = Mild depression, 10-14 = Moderate depression, 15-19 = Moderately severe depression, 20-27 = Severe depression   Psychosocial Evaluation and Intervention: Psychosocial Evaluation - 10/16/17 1023      Psychosocial Evaluation & Interventions   Comments  William Bray has returned to Pulmonary Rehab after being in the hospital for several months late December/early January.  He is a 67 year old who has multiple health issues with CHF and COPD primarily along with severe back problems.  William Bray has a strong support system with a  spouse; sisters and a sister-in-law; a son and a daughter locally.  He reports not sleeping great with maybe 5-6 hours of sleep per night.  His appetite is "fine" currently.  William Bray reports a history of anxiety with some symptoms present several times each day.  He is on medication for this that helps somewhat.  William Bray reports being in a positive mood most of the time and his health is his primary stressor.  He has goals to get stronger and breathe better overall.  Staff will follow with William Bray     Expected Outcomes  Short:  Iman will exercise to reduce his anxiety symptoms.   Long:  Ahman will exercise consistently to recover his stamina and strength and breathe better overall.      Continue Psychosocial Services   Follow up required by staff       Psychosocial Re-Evaluation: Psychosocial Re-Evaluation    [place] Name 10/14/17 1341             Psychosocial Re-Evaluation   Comments  William Bray has just joined the prgram after a year put. He had been in the Maintenance classes and his MD wanted him in Pulmonary Rehab. He is struggling with increased SOB today, his weight is up 9 pounds over a week.  He called his LVAD team and reviewed this info with them. He will keep them informed of how  his weight changes.  Zyshawn recently was placed on Prednisone, and the staff did explain how it can make him retain fluid, it was emphasized that he should be weiging himself daily and reporting 2 0r more weight gain to the LVAD team. He is upset that he is more SOB today and wants to feel better.  He was told to gain some weight and thought the weight gain was form eating.  Will continue to followup with William Bray and encourage him to closely watch his weight., as the more contol of keeping the excess fluid will help with the SOB and his ability to keep up the exercise, which wil help his stamina increase and overall feeling better-whichis his ultimate goal.         Interventions  Therapist referral;Encouraged to attend Pulmonary  Rehabilitation for the exercise;Relaxation education       Continue Psychosocial Services   Follow up required by counselor          Psychosocial Discharge (Final Psychosocial Re-Evaluation): Psychosocial Re-Evaluation - 10/14/17 1341      Psychosocial Re-Evaluation   Comments  William Bray has just joined the prgram after a year put. He had been in the Maintenance classes and his MD wanted him in Pulmonary Rehab. He is struggling with increased SOB today, his weight is up 9 pounds over a week.  He called his LVAD team and reviewed this info with them. He will keep them informed of how his weight changes.  William Bray recently was placed on Prednisone, and the staff did explain how it can make him retain fluid, it was emphasized that he should be weiging himself daily and reporting 2 0r more weight gain to the LVAD team. He is upset that he is more SOB today and wants to feel better.  He was told to gain some weight and thought the weight gain was form eating.  Will continue to followup with William Bray and encourage him to closely watch his weight., as the more contol of keeping the excess fluid will help with the SOB and his ability to keep up the exercise, which wil help his stamina increase and overall feeling better-whichis his ultimate goal.      Interventions  Therapist referral;Encouraged to attend Pulmonary Rehabilitation for the exercise;Relaxation education    Continue Psychosocial Services   Follow up required by counselor       Education: Education Goals: Education classes will be provided on a weekly basis, covering required topics. Participant will state understanding/return demonstration of topics presented.  Learning Barriers/Preferences: Learning Barriers/Preferences - 09/30/17 1436      Learning Barriers/Preferences   Learning Barriers  None    Learning Preferences  None       Education Topics:  Initial Evaluation Education: - Verbal, written and demonstration of respiratory meds, oximetry  and breathing techniques. Instruction on use of nebulizers and MDIs and importance of monitoring MDI activations.   Pulmonary Rehab from 10/23/2017 in [facility] Cardiac and Pulmonary Rehab  Date  09/30/17  Educator  [facility]  Instruction Review Code  1- Verbalizes Understanding      General Nutrition Guidelines/Fats and Fiber: -Group instruction provided by verbal, written material, models and posters to present the general guidelines for heart healthy nutrition. Gives an explanation and review of dietary fats and fiber.   Pulmonary Rehab from 10/23/2017 in [facility] Cardiac and Pulmonary Rehab  Date  10/14/17  Educator  CR  Instruction Review Code  1- Verbalizes Understanding      Controlling Sodium/Reading  Food Labels: -Group verbal and written material supporting the discussion of sodium use in heart healthy nutrition. Review and explanation with models, verbal and written materials for utilization of the food label.   Exercise Physiology & General Exercise Guidelines: - Group verbal and written instruction with models to review the exercise physiology of the cardiovascular system and associated critical values. Provides general exercise guidelines with specific guidelines to those with heart or lung disease.    Aerobic Exercise & Resistance Training: - Gives group verbal and written instruction on the various components of exercise. Focuses on aerobic and resistive training programs and the benefits of this training and how to safely progress through these programs.   Cardiac Rehab from 03/21/2016 in [facility] Cardiac and Pulmonary Rehab  Date  02/29/16  Educator  [facility]  Instruction Review Code (retired)  2- Statistician, Balance, Mind/Body Relaxation: Provides group verbal/written instruction on the benefits of flexibility and balance training, including mind/body exercise modes such as yoga, pilates and tai chi.  Demonstration and skill practice provided.   Cardiac Rehab from  03/21/2016 in [facility] Cardiac and Pulmonary Rehab  Date  01/09/16  Educator  [facility]  Instruction Review Code (retired)  2- meets goals/outcomes      Stress and Anxiety: - Provides group verbal and written instruction about the health risks of elevated stress and causes of high stress.  Discuss the correlation between heart/lung disease and anxiety and treatment options. Review healthy ways to manage with stress and anxiety.   Pulmonary Rehab from 10/23/2017 in [facility] Cardiac and Pulmonary Rehab  Date  10/23/17  Educator  [facility]  Instruction Review Code  1- Verbalizes Understanding      Depression: - Provides group verbal and written instruction on the correlation between heart/lung disease and depressed mood, treatment options, and the stigmas associated with seeking treatment.   Exercise & Equipment Safety: - Individual verbal instruction and demonstration of equipment use and safety with use of the equipment.   Pulmonary Rehab from 10/23/2017 in [facility] Cardiac and Pulmonary Rehab  Date  09/30/17  Educator  [facility]  Instruction Review Code  1- Verbalizes Understanding      Infection Prevention: - Provides verbal and written material to individual with discussion of infection control including proper hand washing and proper equipment cleaning during exercise session.   Pulmonary Rehab from 10/23/2017 in [facility] Cardiac and Pulmonary Rehab  Date  09/30/17  Educator  [facility]  Instruction Review Code  1- Verbalizes Understanding      Falls Prevention: - Provides verbal and written material to individual with discussion of falls prevention and safety.   Pulmonary Rehab from 10/23/2017 in [facility] Cardiac and Pulmonary Rehab  Date  09/30/17  Educator  [facility]  Instruction Review Code  1- Verbalizes Understanding      Diabetes: - Individual verbal and written instruction to review signs/symptoms of diabetes, desired ranges of glucose level fasting, after meals and with exercise. Advice that pre and post exercise  glucose checks will be done for 3 sessions at entry of program.   Cardiac Rehab from 03/21/2016 in [facility] Cardiac and Pulmonary Rehab  Date  01/02/16  Educator  D. Joya Gaskins, RN  Instruction Review Code (retired)  2- meets goals/outcomes      Chronic Lung Diseases: - Group verbal and written instruction to review updates, respiratory medications, advancements in procedures and treatments. Discuss use of supplemental oxygen including available portable oxygen systems, continuous and intermittent flow rates, concentrators, personal use  and safety guidelines. Review proper use of inhaler and spacers. Provide informative websites for self-education.    Energy Conservation: - Provide group verbal and written instruction for methods to conserve energy, plan and organize activities. Instruct on pacing techniques, use of adaptive equipment and posture/positioning to relieve shortness of breath.   Pulmonary Rehab from 10/23/2017 in [facility] Cardiac and Pulmonary Rehab  Date  10/16/17  Educator  [facility]  Instruction Review Code  1- Verbalizes Understanding      Triggers and Exacerbations: - Group verbal and written instruction to review types of environmental triggers and ways to prevent exacerbations. Discuss weather changes, air quality and the benefits of nasal washing. Review warning signs and symptoms to help prevent infections. Discuss techniques for effective airway clearance, coughing, and vibrations.   AED/CPR: - Group verbal and written instruction with the use of models to demonstrate the basic use of the AED with the basic ABC's of resuscitation.   Pulmonary Rehab from 10/23/2017 in [facility] Cardiac and Pulmonary Rehab  Date  10/18/17  Educator  [place]  Instruction Review Code  1- Verbalizes Understanding      Anatomy and Physiology of the Lungs: - Group verbal and written instruction with the use of models to provide basic lung anatomy and physiology related to function, structure and complications of  lung disease.   Anatomy & Physiology of the Heart: - Group verbal and written instruction and models provide basic cardiac anatomy and physiology, with the coronary electrical and arterial systems. Review of Valvular disease and Heart Failure   Cardiac Rehab from 03/21/2016 in [facility] Cardiac and Pulmonary Rehab  Date  01/16/16  Educator  [facility]  Instruction Review Code (retired)  2- meets goals/outcomes      Cardiac Medications: - Group verbal and written instruction to review commonly prescribed medications for heart disease. Reviews the medication, class of the drug, and side effects.   Pulmonary Rehab from 10/23/2017 in [facility] Cardiac and Pulmonary Rehab  Date  10/04/17  Educator  [facility]  Instruction Review Code  1- Verbalizes Understanding      Know Your Numbers and Risk Factors: -Group verbal and written instruction about important numbers in your health.  Discussion of what are risk factors and how they play a role in the disease process.  Review of Cholesterol, Blood Pressure, Diabetes, and BMI and the role they play in your overall health.   Sleep Hygiene: -Provides group verbal and written instruction about how sleep can affect your health.  Define sleep hygiene, discuss sleep cycles and impact of sleep habits. Review good sleep hygiene tips.    Other: -Provides group and verbal instruction on various topics (see comments)    Knowledge Questionnaire Score: Knowledge Questionnaire Score - 09/30/17 1436      Knowledge Questionnaire Score   Pre Score  15/18 reviewed with patient        Core Components/Risk Factors/Patient Goals at Admission: Personal Goals and Risk Factors at Admission - 09/30/17 1438      Core Components/Risk Factors/Patient Goals on Admission    Weight Management  Yes;Weight Maintenance;Weight Loss    Intervention  Weight Management: Develop a combined nutrition and exercise program designed to reach desired caloric intake, while maintaining appropriate  intake of nutrient and fiber, sodium and fats, and appropriate energy expenditure required for the weight goal.;Weight Management: Provide education and appropriate resources to help participant work on and attain dietary goals.;Weight Management/Obesity: Establish reasonable short term and long term weight goals.    Admit Weight  181 lb 4.8 oz (82.2 kg)    Goal Weight: Short Term  170 lb (77.1 kg)    Goal Weight: Long Term  170 lb (77.1 kg)    Expected Outcomes  Short Term: Continue to assess and modify interventions until short term weight is achieved;Long Term: Adherence to nutrition and physical activity/exercise program aimed toward attainment of established weight goal;Weight Maintenance: Understanding of the daily nutrition guidelines, which includes 25-35% calories from fat, 7% or less cal from saturated fats, less than 247m cholesterol, less than 1.5gm of sodium, & 5 or more servings of fruits and vegetables daily;Weight Loss: Understanding of general recommendations for a balanced deficit meal plan, which promotes 1-2 lb weight loss per week and includes a negative energy balance of [id number] kcal/d;Understanding recommendations for meals to include 15-35% energy as protein, 25-35% energy from fat, 35-60% energy from carbohydrates, less than 2089mof dietary cholesterol, 20-35 gm of total fiber daily;Understanding of distribution of calorie intake throughout the day with the consumption of 4-5 meals/snacks    Improve shortness of breath with ADL's  Yes    Intervention  Provide education, individualized exercise plan and daily activity instruction to help decrease symptoms of SOB with activities of daily living.    Expected Outcomes  Short Term: Improve cardiorespiratory fitness to achieve a reduction of symptoms when performing ADLs;Long Term: Be able to perform more ADLs without symptoms or delay the onset of symptoms    Diabetes  Yes    Intervention  Provide education about signs/symptoms and  action to take for hypo/hyperglycemia.;Provide education about proper nutrition, including hydration, and aerobic/resistive exercise prescription along with prescribed medications to achieve blood glucose in normal ranges: Fasting glucose 65-99 mg/dL    Expected Outcomes  Short Term: Participant verbalizes understanding of the signs/symptoms and immediate care of hyper/hypoglycemia, proper foot care and importance of medication, aerobic/resistive exercise and nutrition plan for blood glucose control.;Long Term: Attainment of HbA1C < 7%.    Heart Failure  Yes    Intervention  Provide a combined exercise and nutrition program that is supplemented with education, support and counseling about heart failure. Directed toward relieving symptoms such as shortness of breath, decreased exercise tolerance, and extremity edema.    Expected Outcomes  Improve functional capacity of life;Short term: Attendance in program 2-3 days a week with increased exercise capacity. Reported lower sodium intake. Reported increased fruit and vegetable intake. Reports medication compliance.;Short term: Daily weights obtained and reported for increase. Utilizing diuretic protocols set by physician.;Long term: Adoption of self-care skills and reduction of barriers for early signs and symptoms recognition and intervention leading to self-care maintenance.    Hypertension  Yes    Intervention  Provide education on lifestyle modifcations including regular physical activity/exercise, weight management, moderate sodium restriction and increased consumption of fresh fruit, vegetables, and low fat dairy, alcohol moderation, and smoking cessation.;Monitor prescription use compliance.    Expected Outcomes  Short Term: Continued assessment and intervention until BP is < 140/9061mG in hypertensive participants. < 130/34m67m in hypertensive participants with diabetes, heart failure or chronic kidney disease.;Long Term: Maintenance of blood pressure  at goal levels.       Core Components/Risk Factors/Patient Goals Review:  Goals and Risk Factor Review    Row Name 10/14/17 1350             Core Components/Risk Factors/Patient Goals Review   Personal Goals Review  Weight Management/Obesity;Heart Failure;Improve shortness of breath with ADL's;Diabetes;Hypertension       Review  William Bray has recently started the program. Today he was presenting with incresed SOB and weight gain.  After a call to his LVAD team , William Bray has a game plan with his diurectics to work on the fluid overload. He is taking all his meds and checking his blood sugar levels at home.   His AM fasting BS is 140-200 per William Bray.  HE will report this to his MD.         Expected Outcomes  ST: William Bray will improve the SOB symptoms as he gains control of excess fluid. He will speak to his MD about his blood Glucose elvels and he will continue to attend the program to work on buliding his stamina and strength. LT William Bray will be able to know when to call his MD about his weight gain and will continue to keep other risk factors under control.           Core Components/Risk Factors/Patient Goals at Discharge (Final Review):  Goals and Risk Factor Review - 10/14/17 1350      Core Components/Risk Factors/Patient Goals Review   Personal Goals Review  Weight Management/Obesity;Heart Failure;Improve shortness of breath with ADL's;Diabetes;Hypertension    Review  William Bray has recently started the program. Today he was presenting with incresed SOB and weight gain.  After a call to his LVAD team , William Bray has a game plan with his diurectics to work on the fluid overload. He is taking all his meds and checking his blood sugar levels at home.   His AM fasting BS is 140-200 per William Bray.  HE will report this to his MD.      Expected Outcomes  ST: William Bray will improve the SOB symptoms as he gains control of excess fluid. He will speak to his MD about his blood Glucose elvels and he will continue to attend the program to  work on buliding his stamina and strength. LT William Bray will be able to know when to call his MD about his weight gain and will continue to keep other risk factors under control.        ITP Comments: ITP Comments    Row Name 09/30/17 1400 10/14/17 1122 10/14/17 1339 10/18/17 1147 10/28/17 0812   ITP Comments  Medical Evaluation completed. Chart sent for review and changes to Dr. Emily Filbert Director of [place]. Diagnosis can be found in [facility] encounter 09/16/17  Patient arrived and had gained 9 lbs in 7 days and was short of breath. We was advised by staff not to exercise and to call his LVAD team at [facility]. He called his his team and they cleared him to exercise over the phone. He did exercise at a very light intensity and tolerated it well.   Patient arrived and had gained 9 lbs in 7 days and was short of breath. We was advised by staff not to exercise and to call his LVAD team at [facility]. He called his his team and they cleared him to exercise over the phone. He did exercise at a very light intensity and tolerated it well.    Pt's BP was 64. Patient reported lightheadedness and weakness. He reported that he usually takes lisinopril 7m daily at night. He recently refilled his lisinopril and realized that the dose was 241minstead of 1012mHe checked his medication list and noticed it is 60m39m he took 60mg22mt night but he is not sure when the dose was changed to 60mg.53mwas asked to call his PCP to verify the  right dose. PCP verbalized that dose is 42m not 216m Pt drunk 2 glasses of water. Will continue to monitor patient's status.    30 day review completed. ITP sent to Dr. MaEmily Filbertirector of [facility]Continue with ITP unless changes are made by physician      Comments: 30 day review

## 2017-10-28 NOTE — Telephone Encounter (Signed)
William Bray is having back issues and wont make class today

## 2017-10-29 LAB — COMPLIANCE DRUG ANALYSIS, UR

## 2017-11-01 DIAGNOSIS — E119 Type 2 diabetes mellitus without complications: Secondary | ICD-10-CM | POA: Diagnosis not present

## 2017-11-01 DIAGNOSIS — I10 Essential (primary) hypertension: Secondary | ICD-10-CM | POA: Diagnosis not present

## 2017-11-01 DIAGNOSIS — Z87891 Personal history of nicotine dependence: Secondary | ICD-10-CM | POA: Diagnosis not present

## 2017-11-01 DIAGNOSIS — J449 Chronic obstructive pulmonary disease, unspecified: Secondary | ICD-10-CM

## 2017-11-01 DIAGNOSIS — Z79891 Long term (current) use of opiate analgesic: Secondary | ICD-10-CM | POA: Diagnosis not present

## 2017-11-01 DIAGNOSIS — Z7901 Long term (current) use of anticoagulants: Secondary | ICD-10-CM | POA: Diagnosis not present

## 2017-11-01 DIAGNOSIS — Z7982 Long term (current) use of aspirin: Secondary | ICD-10-CM | POA: Diagnosis not present

## 2017-11-01 DIAGNOSIS — Z79899 Other long term (current) drug therapy: Secondary | ICD-10-CM | POA: Diagnosis not present

## 2017-11-01 NOTE — Progress Notes (Signed)
Daily Session Note  Patient Details  Name: William Bray MRN: [id number] Date of Birth: February 06, 1951 Referring Provider:     Pulmonary Rehab from 09/30/2017 in [facility] Cardiac and Pulmonary Rehab  Referring Provider  [name] MD      Encounter Date: 11/01/2017  Check In: Session Check In - 11/01/17 1011      Check-In   Location  [facility]-Cardiac & Pulmonary Rehab    Staff Present  Justin Mend RCP,RRT,BSRT;Amanda Oletta Darter, BA, ACSM CEP, Exercise Physiologist;Laureen Janell Quiet, RRT, Respiratory Therapist    Supervising physician immediately available to respond to emergencies  [name] immediately available ER MD    Physician(s)  Dr.Malinda and Schaevitz    Medication changes reported      No    Fall or balance concerns reported     No    Tobacco Cessation  No Change    Warm-up and Cool-down  Performed as group-led instruction    Resistance Training Performed  Yes    VAD Patient?  Yes      VAD patient   Has back up controller?  Yes    Has spare charged batteries?  Yes    Has battery cables?  Yes    Has compatible battery clips?  Yes      Pain Assessment   Currently in Pain?  No/denies          Social History   Tobacco Use  Smoking Status Former Smoker  . Packs/day: 1.00  . Years: 45.00  . Pack years: 45.00  . Types: Cigarettes  . Last attempt to quit: 05/28/2005  . Years since quitting: 12.4  Smokeless Tobacco Never Used    Goals Met:  Independence with exercise equipment Exercise tolerated well No report of cardiac concerns or symptoms Strength training completed today  Goals Unmet:  Not Applicable  Comments: Pt able to follow exercise prescription today without complaint.  Will continue to monitor for progression.   Dr. Emily Filbert is Medical Director for [place] and [name].

## 2017-11-04 DIAGNOSIS — J449 Chronic obstructive pulmonary disease, unspecified: Secondary | ICD-10-CM

## 2017-11-04 NOTE — Progress Notes (Signed)
Daily Session Note  Patient Details  Name: William Bray MRN: [id number] Date of Birth: 1950/07/29 Referring Provider:     Pulmonary Rehab from 09/30/2017 in [facility] Cardiac and Pulmonary Rehab  Referring Provider  William Breach MD      Encounter Date: 11/04/2017  Check In: Session Check In - 11/04/17 1017      Check-In   Location  [facility]-Cardiac & Pulmonary Rehab    Staff Present  William Bray William Bray, BS, ACSM CEP, Exercise Physiologist;William Bray, BS, [facility]    Supervising physician immediately available to respond to emergencies  [name] immediately available ER MD    Physician(s)  William Bray and William Bray    Medication changes reported      No    Fall or balance concerns reported     No    Tobacco Cessation  No Change    Warm-up and Cool-down  Performed as group-led instruction    Resistance Training Performed  Yes    VAD Patient?  Yes      VAD patient   Has back up controller?  Yes    Has spare charged batteries?  Yes    Has battery cables?  Yes    Has compatible battery clips?  Yes      Pain Assessment   Currently in Pain?  No/denies          Social History   Tobacco Use  Smoking Status Former Smoker  . Packs/day: 1.00  . Years: 45.00  . Pack years: 45.00  . Types: Cigarettes  . Last attempt to quit: 05/28/2005  . Years since quitting: 12.4  Smokeless Tobacco Never Used    Goals Met:  Independence with exercise equipment Exercise tolerated well No report of cardiac concerns or symptoms Strength training completed today  Goals Unmet:  Not Applicable  Comments: Pt able to follow exercise prescription today without complaint.  Will continue to monitor for progression.   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

## 2017-11-06 DIAGNOSIS — J449 Chronic obstructive pulmonary disease, unspecified: Secondary | ICD-10-CM

## 2017-11-06 NOTE — Progress Notes (Signed)
Daily Session Note  Patient Details  Name: [name] MALLIS MRN: [id number] Date of Birth: [birth year] Referring Provider:     Pulmonary Rehab from 09/30/2017 in [facility] Cardiac and Pulmonary Rehab  Referring Provider  [name] MD      Encounter Date: 11/06/2017  Check In: Session Check In - 11/06/17 1014      Check-In   Location  [facility]Cardiac & Pulmonary Rehab    Staff Present  Justin Mend Lorre Nick, MA, RCEP, CCRP, Exercise Physiologist;Amanda Oletta Darter, [place], ACSM CEP, Exercise Physiologist    Supervising physician immediately available to respond to emergencies  [name] immediately available ER MD    Physician(s)  Dr. Kerman Passey and Joni Fears    Medication changes reported      No    Fall or balance concerns reported     No    Tobacco Cessation  No Change    Warm-up and Cool-down  Performed as group-led instruction    Resistance Training Performed  No Late    VAD Patient?  Yes      VAD patient   Has back up controller?  Yes    Has spare charged batteries?  Yes    Has battery cables?  Yes    Has compatible battery clips?  Yes      Pain Assessment   Currently in Pain?  No/denies          Social History   Tobacco Use  Smoking Status Former Smoker  . Packs/day: 1.00  . Years: 45.00  . Pack years: 45.00  . Types: Cigarettes  . Last attempt to quit: 05/28/2005  . Years since quitting: 12.4  Smokeless Tobacco Never Used    Goals Met:  Independence with exercise equipment Exercise tolerated well Personal goals reviewed No report of cardiac concerns or symptoms Strength training completed today  Goals Unmet:  Not Applicable  Comments: Pt able to follow exercise prescription today without complaint.  Will continue to monitor for progression.   Dr. Emily Filbert is Medical Director for [place] and [name].

## 2017-11-08 DIAGNOSIS — J449 Chronic obstructive pulmonary disease, unspecified: Secondary | ICD-10-CM | POA: Diagnosis not present

## 2017-11-08 NOTE — Progress Notes (Signed)
Daily Session Note  Patient Details  Name: GERIK COBERLY MRN: [id number] Date of Birth: 1951/05/15 Referring Provider:     Pulmonary Rehab from 09/30/2017 in [facility] Cardiac and Pulmonary Rehab  Referring Provider  [name] MD      Encounter Date: 11/08/2017  Check In: Session Check In - 11/08/17 1001      Check-In   Location  [facility]-Cardiac & Pulmonary Rehab    Staff Present  Justin Mend RCP,RRT,BSRT;Amanda Oletta Darter, BA, ACSM CEP, Exercise Physiologist;Meredith Sherryll Burger, RN BSN    Supervising physician immediately available to respond to emergencies  [name] immediately available ER MD    Physician(s)  Dr. Corky Downs and Joni Fears    Medication changes reported      No    Fall or balance concerns reported     No    Tobacco Cessation  No Change    Warm-up and Cool-down  Performed as group-led instruction    Resistance Training Performed  Yes    VAD Patient?  No      VAD patient   Has back up controller?  Yes    Has spare charged batteries?  Yes    Has battery cables?  Yes    Has compatible battery clips?  Yes      Pain Assessment   Currently in Pain?  No/denies          Social History   Tobacco Use  Smoking Status Former Smoker  . Packs/day: 1.00  . Years: 45.00  . Pack years: 45.00  . Types: Cigarettes  . Last attempt to quit: 05/28/2005  . Years since quitting: 12.4  Smokeless Tobacco Never Used    Goals Met:  Independence with exercise equipment Exercise tolerated well No report of cardiac concerns or symptoms Strength training completed today  Goals Unmet:  Not Applicable  Comments: Pt able to follow exercise prescription today without complaint.  Will continue to monitor for progression.   Dr. Emily Filbert is Medical Director for [place] and [name].

## 2017-11-11 DIAGNOSIS — J449 Chronic obstructive pulmonary disease, unspecified: Secondary | ICD-10-CM

## 2017-11-11 NOTE — Progress Notes (Signed)
Daily Session Note  Patient Details  Name: William Bray MRN: [id number] Date of Birth: 03/03/1951 Referring Provider:     Pulmonary Rehab from 09/30/2017 in [facility] Cardiac and Pulmonary Rehab  Referring Provider  [name] MD      Encounter Date: 11/11/2017  Check In: Session Check In - 11/11/17 1011      Check-In   Location  [facility]-Cardiac & Pulmonary Rehab    Staff Present  Justin Mend RCP,RRT,BSRT;Amanda Oletta Darter, BA, ACSM CEP, Exercise Physiologist;Kelly Amedeo Plenty, BS, ACSM CEP, Exercise Physiologist    Supervising physician immediately available to respond to emergencies  [name] immediately available ER MD    Physician(s)  Dr. Corky Downs and Clearnce Hasten    Medication changes reported      No    Fall or balance concerns reported     No    Tobacco Cessation  No Change    Warm-up and Cool-down  Performed as group-led instruction    Resistance Training Performed  Yes    VAD Patient?  Yes      VAD patient   Has back up controller?  Yes    Has spare charged batteries?  Yes    Has battery cables?  Yes    Has compatible battery clips?  Yes      Pain Assessment   Currently in Pain?  No/denies          Social History   Tobacco Use  Smoking Status Former Smoker  . Packs/day: 1.00  . Years: 45.00  . Pack years: 45.00  . Types: Cigarettes  . Last attempt to quit: 05/28/2005  . Years since quitting: 12.4  Smokeless Tobacco Never Used    Goals Met:  Independence with exercise equipment Exercise tolerated well No report of cardiac concerns or symptoms Strength training completed today  Goals Unmet:  Not Applicable  Comments: Pt able to follow exercise prescription today without complaint.  Will continue to monitor for progression.   Dr. Emily Filbert is Medical Director for [place] and [name].

## 2017-11-15 ENCOUNTER — Telehealth: Payer: Self-pay

## 2017-11-15 NOTE — Telephone Encounter (Signed)
William Bray is having back pain and cannot attend class.  He plans to be back Monday - sees pain clinic Wednesday before LW class

## 2017-11-18 NOTE — Progress Notes (Signed)
Patient's Name: William Bray  MRN: [id number]  Referring Provider: Juluis Pitch, MD  DOB: July 17, 1950  PCP: Juluis Pitch, MD  DOS: 11/20/2017  Note by: Gaspar Cola, MD  Service setting: Ambulatory outpatient  Specialty: Interventional Pain Management  Location: [facility] (AMB) Pain Management Facility    Patient type: Established   Primary Reason(s) for Visit: Encounter for evaluation before starting new chronic pain management plan of care (Level of risk: moderate) CC: Back Pain (lower); Neck Pain; and Foot Pain (bilateral)  HPI  William Bray is a 67 y.o. year old, male patient, who comes today for a follow-up evaluation to review the test results and decide on a treatment plan. He has Acute on chronic systolic heart failure ([place]); Anxiety; Automatic implantable cardioverter-defibrillator in situ; CAD (coronary artery disease); Chronic anticoagulation (Coumadin); Chronic systolic CHF (congestive heart failure) ([place]); Pulmonary emphysema (HCC); DDD (degenerative disc disease), lumbar; Depression; Presence of drug coated stent in left circumflex coronary artery; Encounter for fitting or adjustment of automatic implantable cardioverter-defibrillator; Essential hypertension; Epistaxis; H/O ventricular fibrillation; HOH (hard of hearing); Hypertriglyceridemia; Iron deficiency anemia due to chronic blood loss; Ischemic cardiomyopathy; Lumbar radiculitis; Lumbar stenosis with neurogenic claudication; LVAD (left ventricular assist device) present ([place]); 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; NSVT (nonsustained ventricular tachycardia) ([place]); OSA treated with BiPAP; Paroxysmal atrial fibrillation ([place]); Patient in clinical research study; PVC's (premature ventricular contractions); Steatohepatitis; Valvular regurgitation; Acute on chronic respiratory failure with hypercapnia ([place]); Chronic gouty arthritis; Aortic insufficiency; Complication involving left ventricular assist device (LVAD); Constipation;  History of GI bleed; History of thrombocytopenia; Influenza A; Leukocytosis; On amiodarone therapy; Thyroid mass of unclear etiology; Type 2 diabetes mellitus without complication, without long-term current use of insulin ([place]); Hyperlipidemia; Chronic low back pain (Primary Area of Pain) (Bilateral); Chronic neck pain (Secondary Area of Pain) (Bilateral) (L>R); Chronic shoulder pain (Tertiary Area of Pain) (Bilateral) (L>R); Chronic pain syndrome; Long term current use of opiate analgesic; Disorder of skeletal system; Problems influencing health status; Pharmacologic therapy; Elevated C-reactive protein (CRP); Elevated sed rate; Neurogenic pain; Positive ANA (antinuclear antibody); Muscle weakness of lower extremity (Bilateral) (L>R); Chronic sacroiliac joint pain (Left); Lumbar facet joint syndrome (Bilateral) (L>R); Lumbar discogenic pain syndrome; DDD (degenerative disc disease), cervical; Radicular pain of shoulder (Bilateral); Cervical radiculitis; History of claustrophobia; Generalized anxiety disorder; Other intervertebral disc degeneration, lumbar region; Cervicalgia; and Other cervical disc degeneration, unspecified cervical region on their problem list. His primarily concern today is the Back Pain (lower); Neck Pain; and Foot Pain (bilateral)  Pain Assessment: Location: Lower Back Radiating: neck pain radiates to shoulders, back pain does not radiate Onset: More than a month ago Duration: Chronic pain Quality: Sharp Severity: 5 /10 (subjective, self-reported pain score)  Note: Reported level is compatible with observation. Clinically the patient looks like a 5/10 A 5/10 is viewed as "Severe" and described as intense and extremely unpleasant. Associated with frowning face and frequent crying. Pain overwhelms the senses.  Ability to do any activity or maintain social relationships becomes significantly limited. Conversation becomes difficult. Pacing back and forth is common, as getting into a  comfortable position is nearly impossible. Pain wakes you up from deep sleep. Physical signs will be obvious: pupillary dilation; increased sweating; goosebumps; brisk reflexes; cold, clammy hands and feet; nausea, vomiting or dry heaves; loss of appetite; significant sleep disturbance with inability to fall asleep or to remain asleep. When persistent, significant weight loss is observed due to the complete loss of appetite and sleep deprivation.  Blood pressure and heart  rate becomes significantly elevated.       When using our objective Pain Scale, levels between 6 and 10/10 are said to belong in an emergency room, as it progressively worsens from a 6/10, described as severely limiting, requiring emergency care not usually available at an outpatient pain management facility. At a 6/10 level, communication becomes difficult and requires great effort. Assistance to reach the emergency department may be required. Facial flushing and profuse sweating along with potentially dangerous increases in heart rate and blood pressure will be evident. Timing: Constant Modifying factors: Tylenol, lying down, rest, heat, ice, Oxycodone BP: (Unable to obtain BP, pt states usually requires a doppler to)  HR: 94  William Bray comes in today for a follow-up visit after his initial evaluation on 10/22/2017. Today we went over the results of his tests. These were explained in "Layman's terms". During today's appointment we went over my diagnostic impression, as well as the proposed treatment plan.  Pertinent to the patient his primary area of pain is in his lower back. He states that the pain is midline (B) (L>R). He denies the pain radiating. He admits the pain may have started in 1992 after cleaning his neighbors gutters.. He denies any previous surgeries. He admits that he has had epidural steroid injections by Dr. Lubertha Sayres (4-5 years ago). He did not feel like they were effective for more than 3-4 days. He did have physical  therapy in 1990s it was effective for short period. He is currently in pulmonary therapy. He denies any recent images. Indicates bilateral leg weakness.  His second area of pain is his neck. He admits that the left side is greater than the right (B) (L>R). He denies any numbness or tingling in his arms. He denies any previous surgery, interventional therapy, physical therapy or recent images.  His third area of pain in his shoulders. He admits that the left side is greater than the right (B) (L>R). He denies any precipitating factors. He admits that he does have some weakness. He denies any numbness or tingling arms. He denies any previous surgery, interventional therapy, physical therapy or recent.  He thought he was recently diagnosed with lung cancer but since, the lung nodules have resolved.  Has seen Dr. Precious Reel in the past for arthritis evaluation. Takes prednisone 10 mg daily.  In considering the treatment plan options, William Bray was reminded that I no longer take patients for medication management only. I asked him to let me know if he had no intention of taking advantage of the interventional therapies, so that we could make arrangements to provide this space to someone interested. I also made it clear that undergoing interventional therapies for the purpose of getting pain medications is very inappropriate on the part of a patient, and it will not be tolerated in this practice. This type of behavior would suggest true addiction and therefore it requires referral to an addiction specialist.   This patient has multiple medical problems that make him a very high risk for medication management and/or interventional therapies. At this point, we are unsure whether or not we can offer him anything.  Further details on both, my assessment(s), as well as the proposed treatment plan, please see below.  Controlled Substance Pharmacotherapy Assessment REMS (Risk Evaluation and Mitigation  Strategy)  Analgesic: none Highest recorded MME/day: 30 day mg/day MME/day: 0 mg/day Pill Count: None expected due to no prior prescriptions written by our practice. Landis Martins, RN  11/20/2017  8:32  AM  Sign at close encounter Safety precautions to be maintained throughout the outpatient stay will include: orient to surroundings, keep bed in low position, maintain call bell within reach at all times, provide assistance with transfer out of bed and ambulation.    Pharmacokinetics: Liberation and absorption (onset of action): WNL Distribution (time to peak effect): WNL Metabolism and excretion (duration of action): WNL         Pharmacodynamics: Desired effects: Analgesia: Mr. Gersten reports >50% benefit. Functional ability: Patient reports that medication allows him to accomplish basic ADLs Clinically meaningful improvement in function (CMIF): Sustained CMIF goals met Perceived effectiveness: Described as relatively effective, allowing for increase in activities of daily living (ADL) Undesirable effects: Side-effects or Adverse reactions: None reported Monitoring: [place] PMP: Online review of the past 28-monthperiod previously conducted. Not applicable at this point since we have not taken over the patient's medication management yet. List of other Serum/Urine Drug Screening Test(s):  Lab Results  Component Value Date   COCAINSCRNUR NEGATIVE 05/29/2014   THCU NEGATIVE 05/29/2014   List of all UDS test(s) done:  Lab Results  Component Value Date   SUMMARY FINAL 10/22/2017   Last UDS on record: Summary  Date Value Ref Range Status  10/22/2017 FINAL  Final    Comment:    ==================================================================== TOXASSURE COMP DRUG ANALYSIS,UR ==================================================================== Test                             Result       Flag       Units Drug Present and Declared for Prescription Verification   Gabapentin                      PRESENT      EXPECTED   Fluoxetine                     PRESENT      EXPECTED   Norfluoxetine                  PRESENT      EXPECTED    Norfluoxetine is an expected metabolite of fluoxetine.   Mirtazapine                    PRESENT      EXPECTED Drug Present not Declared for Prescription Verification   Trazodone                      PRESENT      UNEXPECTED   1,3 chlorophenyl piperazine    PRESENT      UNEXPECTED    1,3-chlorophenyl piperazine is an expected metabolite of    trazodone.   Dextromethorphan               PRESENT      UNEXPECTED   Dextrorphan/Levorphanol        PRESENT      UNEXPECTED    Dextrorphan is an expected metabolite of dextromethorphan, an    over-the-counter or prescription cough suppressant. Dextrorphan    cannot be distinguished from the scheduled prescription    medication levorphanol by the method used for analysis.   Guaifenesin                    PRESENT      UNEXPECTED    Guaifenesin may be administered as an over-the-counter or  prescription drug; it may also be present as a breakdown product    of methocarbamol. Drug Absent but Declared for Prescription Verification   Alprazolam                     Not Detected UNEXPECTED ng/mg creat   Salicylate                     Not Detected UNEXPECTED    Aspirin, as indicated in the declared medication list, is not    always detected even when used as directed.   Metoprolol                     Not Detected UNEXPECTED ==================================================================== Test                      Result    Flag   Units      Ref Range   Creatinine              38               mg/dL      >=20 ==================================================================== Declared Medications:  The flagging and interpretation on this report are based on the  following declared medications.  Unexpected results may arise from  inaccuracies in the declared medications.  **Note: The testing scope of this  panel includes these medications:  Alprazolam  Fluoxetine  Gabapentin  Metoprolol  Mirtazapine  **Note: The testing scope of this panel does not include small to  moderate amounts of these reported medications:  Aspirin (Aspirin 81)  **Note: The testing scope of this panel does not include following  reported medications:  Albuterol  Allopurinol  Budesonide  Cetirizine  Cyanocobalamin  Iron (Ferrous Sulfate)  Lisinopril  Magnesium Oxide  Montelukast  Multivitamin (MVI)  Ondansetron  Pantoprazole  Pravastatin  Spironolactone  Torsemide  Umeclidinium (Anoro Ellipta)  Vilanterol (Anoro Ellipta)  Warfarin ==================================================================== For clinical consultation, please call [phone]. ====================================================================    UDS interpretation: Unexpected findings not considered significantly abnormal.          Medication Assessment Form: Not applicable. Treatment compliance: Treatment may start today if patient agrees with proposed plan. Evaluation of compliance is not applicable at this point Risk Assessment Profile: Aberrant behavior: See initial evaluations. None observed or detected today Comorbid factors increasing risk of overdose: Benzodiazepine use, caucasian, concomitant use of Benzodiazepines, COPD or asthma, male gender, sleep apnea and severe COPD and hypercapnia Medical Psychology Evaluation: Please see scanned results in medical record.  ORT Scoring interpretation table:  Score <3 = Low Risk for SUD  Score between 4-7 = Moderate Risk for SUD  Score >8 = High Risk for Opioid Abuse   Risk Mitigation Strategies:  Patient opioid safety counseling: Completed today. Counseling provided to patient as per "Patient Counseling Document". Document signed by patient, attesting to counseling and understanding Patient-Prescriber Agreement (PPA): Obtained today.  Controlled substance notification  to other providers: Written and sent today.  Pharmacologic Plan: Today we may be taking over the patient's pharmacological regimen. See below.             Laboratory Chemistry  Inflammation Markers (CRP: Acute Phase) (ESR: Chronic Phase) Lab Results  Component Value Date   CRP 5.9 (H) 10/22/2017   ESRSEDRATE 32 (H) 10/22/2017                         Renal  Function Markers Lab Results  Component Value Date   BUN 25 10/22/2017   CREATININE 1.20 10/22/2017   BCR 21 10/22/2017   GFRAA 72 10/22/2017   GFRNONAA 62 10/22/2017                             Hepatic Function Markers Lab Results  Component Value Date   AST 26 10/22/2017   ALT 25 01/29/2015   ALBUMIN 4.2 10/22/2017   ALKPHOS 66 10/22/2017                        Electrolytes Lab Results  Component Value Date   NA 132 (L) 10/22/2017   K 5.1 10/22/2017   CL 92 (L) 10/22/2017   CALCIUM 9.5 10/22/2017   MG 2.0 10/22/2017   PHOS 4.4 05/24/2014                        Neuropathy Markers Lab Results  Component Value Date   VITAMINB12 1,565 (H) 10/22/2017   HGBA1C 5.3 06/02/2014                        Bone Pathology Markers Lab Results  Component Value Date   25OHVITD1 35 10/22/2017   25OHVITD2 <1.0 10/22/2017   25OHVITD3 35 10/22/2017                         Coagulation Parameters Lab Results  Component Value Date   INR 1.81 01/28/2017   LABPROT 20.8 (H) 01/28/2017   APTT 32.1 06/21/2014   PLT 112 (L) 01/28/2017                        Cardiovascular Markers Lab Results  Component Value Date   BNP 10,778 (H) 06/20/2014   CKTOTAL 111 06/20/2014   CKMB 29.4 (H) 06/20/2014   TROPONINI 13.00 (H) 06/20/2014   HGB 9.1 (L) 01/28/2017   HCT 27.9 (L) 01/28/2017                         Note: Lab results reviewed.  Recent Diagnostic Imaging Review  Cervical Imaging: Cervical DG Bending/F/E views:  Results for orders placed during the hospital encounter of 10/23/17  DG Cervical Spine With Flex & Extend    Narrative CLINICAL DATA:  Chronic lower back pain, chronic neck pain. Chronic bilateral shoulder pain;Pt has an external heart monitor (LVAP) that can't be moved;  EXAM: CERVICAL SPINE COMPLETE WITH FLEXION AND EXTENSION VIEWS  COMPARISON:  None.  FINDINGS: Normal alignment. No definite dynamic instability on lateral flexion/extension radiographs. No prevertebral soft tissue swelling. Negative for fracture. Moderate narrowing of the C5-6 interspace. Anterior endplate spurring L0-B8. Previous median sternotomy. Left subclavian AICD partially visualized. Probable bilateral carotid arterial calcifications.  IMPRESSION: 1. Negative for fracture or dynamic instability. 2. Degenerative disc disease C5-6.   Electronically Signed   By: Lucrezia Europe M.D.   On: 10/23/2017 15:31    Shoulder Imaging: Shoulder-R DG:  Results for orders placed during the hospital encounter of 10/23/17  DG Shoulder Right   Narrative CLINICAL DATA:  Chronic lower back pain, chronic neck pain. Chronic bilateral shoulder pain;Pt has an external heart monitor (LVAP) that can't be moved;  EXAM: RIGHT SHOULDER - 2+ VIEW  COMPARISON:  None.  FINDINGS: There is no  evidence of fracture or dislocation. There is no evidence of arthropathy or other focal bone abnormality. Soft tissues are unremarkable.  IMPRESSION: Negative.   Electronically Signed   By: Lucrezia Europe M.D.   On: 10/23/2017 15:32    Shoulder-L DG:  Results for orders placed during the hospital encounter of 10/23/17  DG Shoulder Left   Narrative CLINICAL DATA:  Chronic lower back pain, chronic neck pain. Chronic bilateral shoulder pain;Pt has an external heart monitor (LVAP) that can't be moved;  EXAM: LEFT SHOULDER - 2+ VIEW  COMPARISON:  None.  FINDINGS: There is no evidence of fracture or dislocation. There is no evidence of arthropathy or other focal bone abnormality. Soft tissues are unremarkable. Left subclavian AICD  partially visualized. Previous median sternotomy.  IMPRESSION: Negative.   Electronically Signed   By: Lucrezia Europe M.D.   On: 10/23/2017 15:32    Lumbosacral Imaging: Lumbar DG Bending views:  Results for orders placed during the hospital encounter of 10/23/17  DG Lumbar Spine Complete W/Bend   Narrative CLINICAL DATA:  Chronic lower back pain, chronic neck pain. Chronic bilateral shoulder pain;Pt has an external heart monitor (LVAP) that can't be moved;  EXAM: LUMBAR SPINE - COMPLETE WITH BENDING VIEWS  COMPARISON:  CT 05/06/2014  FINDINGS: There is no evidence of lumbar spine fracture. Alignment is normal. Intervertebral disc spaces are maintained. Bilateral facet DJD L5-S1, left worse than right. LVAD device partially visualized. 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  IMPRESSION: 1. Negative for fracture or other acute bone abnormality. 2. Facet DJD L5-S1 left greater than right. 3. 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 Recommend follow up by [place] in 2years. This recommendation follows ACR consensus guidelines: White Paper of the ACR Incidental Findings Committee II on Vascular Findings. Joellyn Rued [id number]; 50:[id number].   Electronically Signed   By: Lucrezia Europe M.D.   On: 10/23/2017 15:30    Foot Imaging: Foot-R DG Complete:  Results for orders placed during the hospital encounter of 08/13/16  DG Foot Complete Right   Narrative CLINICAL DATA:  Right foot pain for 3 weeks, dorsal contusion with oxygen tank 2 weeks ago  EXAM: RIGHT FOOT COMPLETE - 3+ VIEW  COMPARISON:  None.  FINDINGS: Three views of the right foot submitted. No acute fracture or subluxation. No radiopaque foreign body. There is soft tissue swelling dorsal metatarsal and tarsal region. Tiny plantar spur of calcaneus.  IMPRESSION: No acute fracture or subluxation. Soft tissue swelling  dorsal metatarsal and tarsal region. Tiny plantar spur of calcaneus.   Electronically Signed   By: Lahoma Crocker M.D.   On: 08/13/2016 15:36    Complexity Note: Imaging results reviewed. Results shared with William Bray, using Layman's terms.                         Meds   Current Outpatient Medications:  .  albuterol (PROVENTIL) (2.5 MG/3ML) 0.083% nebulizer solution, Inhale 3 mLs into the lungs every 6 (six) hours as needed., Disp: , Rfl:  .  allopurinol (ZYLOPRIM) 100 MG tablet, Take 100 mg by mouth daily., Disp: , Rfl:  .  ALPRAZolam (XANAX) 0.5 MG tablet, Take 0.5 mg by mouth 2 (two) times daily as needed. ONE IN THE MORNING AND ONE AT BEDTIME PRN, Disp: , Rfl:  .  aspirin EC 81 MG tablet, Take 81 mg by mouth daily., Disp: , Rfl:  .  budesonide (PULMICORT)  0.5 MG/2ML nebulizer solution, Inhale 2 mLs into the lungs 2 (two) times daily., Disp: , Rfl:  .  cyanocobalamin 100 MCG tablet, Take 100 mcg by mouth daily., Disp: , Rfl:  .  ferrous sulfate 324 (65 Fe) MG TBEC, Take 324 mg by mouth every morning. WITH BREAKFAST, Disp: , Rfl:  .  FLUoxetine (PROZAC) 20 MG capsule, Take 80 mg by mouth daily., Disp: , Rfl:  .  gabapentin (NEURONTIN) 300 MG capsule, Take 600 mg by mouth 3 (three) times daily. , Disp: , Rfl:  .  magnesium oxide (MAG-OX) 400 MG tablet, Take 400 mg by mouth daily., Disp: , Rfl:  .  mirtazapine (REMERON) 30 MG tablet, Take 0.5 tablets by mouth at bedtime., Disp: , Rfl:  .  Multiple Vitamins-Minerals (MULTIVITAMIN WITH MINERALS) tablet, Take 1 tablet by mouth daily., Disp: , Rfl:  .  ondansetron (ZOFRAN) 4 MG tablet, Take 4 mg by mouth every 8 (eight) hours as needed for nausea., Disp: , Rfl:  .  umeclidinium-vilanterol (ANORO ELLIPTA) 62.5-25 MCG/INH AEPB, Inhale 1 puff into the lungs daily., Disp: , Rfl:  .  warfarin (COUMADIN) 4 MG tablet, Take 4 mg by mouth 4 (four) times a week., Disp: , Rfl:  .  cetirizine (ZYRTEC) 10 MG tablet, Take 10 mg by mouth daily., Disp: ,  Rfl:  .  diazepam (VALIUM) 5 MG tablet, Take 1 tablet (5 mg total) by mouth as needed for up to 2 doses for anxiety (Take one tab 45 minutes before MRI. Take second tablet just prior to MRI scan). Do not take medication within 4 hours of taking opioid pain medications. Must have a driver. Do not drive or operate machinery x 24 hours after taking this medication., Disp: 2 tablet, Rfl: 0 .  lisinopril (PRINIVIL,ZESTRIL) 2.5 MG tablet, Take 2.5 mg by mouth daily., Disp: , Rfl:  .  metoprolol tartrate (LOPRESSOR) 25 MG tablet, Take 25 mg by mouth 2 (two) times daily., Disp: , Rfl:  .  montelukast (SINGULAIR) 10 MG tablet, Take 10 mg by mouth at bedtime., Disp: , Rfl:  .  pantoprazole (PROTONIX) 40 MG tablet, Take 40 mg by mouth daily., Disp: , Rfl:  .  pravastatin (PRAVACHOL) 40 MG tablet, Take 40 mg by mouth at bedtime., Disp: , Rfl:  .  spironolactone (ALDACTONE) 25 MG tablet, Take 25 mg by mouth daily., Disp: , Rfl:  .  torsemide (DEMADEX) 20 MG tablet, Take 2 tablets by mouth daily., Disp: , Rfl:  .  warfarin (COUMADIN) 3 MG tablet, Take 3 mg by mouth 3 (three) times a week., Disp: , Rfl:  .  warfarin (COUMADIN) 5 MG tablet, Take 5 mg by mouth as directed. Taking 5 mg M, W, F and 4 mg T, Th, Sat, Sun., Disp: , Rfl:   ROS  Constitutional: Denies any fever or chills Gastrointestinal: No reported hemesis, hematochezia, vomiting, or acute GI distress Musculoskeletal: Denies any acute onset joint swelling, redness, loss of ROM, or weakness Neurological: No reported episodes of acute onset apraxia, aphasia, dysarthria, agnosia, amnesia, paralysis, loss of coordination, or loss of consciousness  Allergies  William Bray is allergic to bee venom and coreg [carvedilol].  PFSH  Drug: William Bray  reports that he does not use drugs. Alcohol:  reports that he does not drink alcohol. Tobacco:  reports that he quit smoking about 12 years ago. His smoking use included cigarettes. He has a 45.00 pack-year  smoking history. He has never used smokeless tobacco. Medical:  has a past medical history of Arthritis, Chronic back pain, Diabetes mellitus without complication ([place]), Hypertension, and Neuropathy. Surgical: William Bray  has a past surgical history that includes Pain pump revision; LVAP (2016); and ICD IMPLANT (2016). Family: family history includes Heart disease in his mother.  Constitutional Exam  General appearance: alert, cooperative, slowed mentation, oriented, in mild distress, cachectic, well nourished, well hydrated and pale Vitals:   11/20/17 0818  Pulse: 94  Resp: 16  Temp: 97.7 F (36.5 C)  TempSrc: Oral  SpO2: (!) 85%  Weight: 180 lb (81.6 kg)  Height: _0  (1.778 m)   BMI Assessment: Estimated body mass index is 25.83 kg/m as calculated from the following:   Height as of this encounter: _1  (1.778 m).   Weight as of this encounter: 180 lb (81.6 kg).  BMI interpretation table: BMI level Category Range association with higher incidence of chronic pain  <18 kg/m2 Underweight   18.5-24.9 kg/m2 Ideal body weight   25-29.9 kg/m2 Overweight Increased incidence by 20%  30-34.9 kg/m2 Obese (Class I) Increased incidence by 68%  35-39.9 kg/m2 Severe obesity (Class II) Increased incidence by 136%  >40 kg/m2 Extreme obesity (Class III) Increased incidence by 254%   Patient's current BMI Ideal Body weight  Body mass index is 25.83 kg/m. Ideal body weight: 73 kg (160 lb 15 oz) Adjusted ideal body weight: 76.5 kg (168 lb 9 oz)   BMI Readings from Last 4 Encounters:  11/20/17 25.83 kg/m  10/22/17 25.83 kg/m  09/30/17 27.01 kg/m  01/28/17 26.54 kg/m   Wt Readings from Last 4 Encounters:  11/20/17 180 lb (81.6 kg)  10/22/17 180 lb (81.6 kg)  09/30/17 181 lb 4.8 oz (82.2 kg)  01/28/17 185 lb (83.9 kg)  Psych/Mental status: Alert, oriented x 3 (person, place, & time)       Eyes: PERLA Respiratory: Oxygen-dependent COPD  Cervical Spine Area Exam  Skin & Axial  Inspection: No masses, redness, edema, swelling, or associated skin lesions Alignment: Symmetrical Functional ROM: Decreased ROM      Stability: No instability detected Muscle Tone/Strength: Functionally intact. No obvious neuro-muscular anomalies detected. Sensory (Neurological): Movement-associated pain Palpation: No palpable anomalies              Upper Extremity (UE) Exam    Side: Right upper extremity  Side: Left upper extremity  Skin & Extremity Inspection: Skin color, temperature, and hair growth are WNL. No peripheral edema or cyanosis. No masses, redness, swelling, asymmetry, or associated skin lesions. No contractures.  Skin & Extremity Inspection: Skin color, temperature, and hair growth are WNL. No peripheral edema or cyanosis. No masses, redness, swelling, asymmetry, or associated skin lesions. No contractures.  Functional ROM: Decreased ROM for shoulder  Functional ROM: Decreased ROM for shoulder  Muscle Tone/Strength: Functionally intact. No obvious neuro-muscular anomalies detected.  Muscle Tone/Strength: Functionally intact. No obvious neuro-muscular anomalies detected.  Sensory (Neurological): Movement-associated discomfort affecting the shoulder  Sensory (Neurological): Movement-associated discomfort affecting the shoulder  Palpation: No palpable anomalies              Palpation: No palpable anomalies              Provocative Test(s):  Phalen's test: deferred Tinel's test: deferred Apley's scratch test (touch opposite shoulder):  Action 1 (Across chest): deferred Action 2 (Overhead): deferred Action 3 (LB reach): deferred   Provocative Test(s):  Phalen's test: deferred Tinel's test: deferred Apley's scratch test (touch opposite shoulder):  Action 1 (Across chest): deferred  Action 2 (Overhead): deferred Action 3 (LB reach): deferred    Thoracic Spine Area Exam  Skin & Axial Inspection: No masses, redness, or swelling Alignment: Symmetrical Functional ROM:  Unrestricted ROM Stability: No instability detected Muscle Tone/Strength: Functionally intact. No obvious neuro-muscular anomalies detected. Sensory (Neurological): Unimpaired Muscle strength & Tone: No palpable anomalies  Lumbar Spine Area Exam  Skin & Axial Inspection: No masses, redness, or swelling Alignment: Symmetrical Functional ROM: Decreased ROM       Stability: No instability detected Muscle Tone/Strength: Functionally intact. No obvious neuro-muscular anomalies detected. Sensory (Neurological): Movement-associated pain Palpation: Complains of area being tender to palpation       Provocative Tests: Lumbar Hyperextension/rotation test: (+) bilaterally for facet joint pain. Lumbar quadrant test (Kemp's test): (+) bilaterally for facet joint pain. Lumbar Lateral bending test: deferred today       Patrick's Maneuver: (+) for left-sided S-I arthralgia             FABER test: (+) for left-sided S-I arthralgia Thigh-thrust test: deferred today       S-I compression test: deferred today       S-I distraction test: deferred today        Gait & Posture Assessment  Ambulation: Patient ambulates using a cane Gait: Significantly limited. Dependent on assistive device to ambulate Posture: Antalgic   Lower Extremity Exam    Side: Right lower extremity  Side: Left lower extremity  Stability: No instability observed          Stability: No instability observed          Skin & Extremity Inspection: Skin color, temperature, and hair growth are WNL. No peripheral edema or cyanosis. No masses, redness, swelling, asymmetry, or associated skin lesions. No contractures.  Skin & Extremity Inspection: Skin color, temperature, and hair growth are WNL. No peripheral edema or cyanosis. No masses, redness, swelling, asymmetry, or associated skin lesions. No contractures.  Functional ROM: Decreased ROM for hip and knee joints          Functional ROM: Decreased ROM for hip and knee joints           Muscle Tone/Strength: Able to Toe-walk & Heel-walk without problems  Muscle Tone/Strength: Able to Toe-walk & Heel-walk without problems  Sensory (Neurological): Movement-associated discomfort  Sensory (Neurological): Movement-associated discomfort  Palpation: No palpable anomalies  Palpation: No palpable anomalies   Assessment & Plan  Primary Diagnosis & Pertinent Problem List: The primary encounter diagnosis was Chronic pain syndrome. Diagnoses of Chronic low back pain (Primary Area of Pain) (Bilateral), Lumbar discogenic pain syndrome, Lumbar facet joint syndrome (Bilateral) (L>R), Chronic sacroiliac joint pain (Left), DDD (degenerative disc disease), lumbar, Other intervertebral disc degeneration, lumbar region, Lumbar stenosis with neurogenic claudication, Lumbar radiculitis, Muscle weakness of lower extremity (Bilateral) (L>R), Chronic neck pain (Secondary Area of Pain) (Bilateral) (L>R), Cervicalgia, DDD (degenerative disc disease), cervical, Other cervical disc degeneration, unspecified cervical region, Chronic shoulder pain (Tertiary Area of Pain) (Bilateral) (L>R), Radicular pain of shoulder (Bilateral), Cervical radiculitis, 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  Visit Diagnosis: 1. Chronic pain syndrome   2. Chronic low back pain (Primary Area of Pain) (  Bilateral)   3. Lumbar discogenic pain syndrome   4. Lumbar facet joint syndrome (Bilateral) (L>R)   5. Chronic sacroiliac joint pain  (Left)   6. DDD (degenerative disc disease), lumbar   7. Other intervertebral disc degeneration, lumbar region   8. Lumbar stenosis with neurogenic claudication   9. Lumbar radiculitis   10. Muscle weakness of lower extremity (Bilateral) (L>R)   11. Chronic neck pain (Secondary Area of Pain) (Bilateral) (L>R)   12. Cervicalgia   13. DDD (degenerative disc disease), cervical   14. Other cervical disc degeneration, unspecified cervical region   15. Chronic shoulder pain (Tertiary Area of Pain) (Bilateral) (L>R)   16. Radicular pain of shoulder (Bilateral)   17. Cervical radiculitis   18. 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 Neurogenic pain   20. Chronic gouty arthritis   21. Elevated C-reactive protein (CRP)   22. Elevated sed rate   23. Positive ANA (antinuclear antibody)   24. Disorder of skeletal system   25. Pharmacologic therapy   26. Long term current use of opiate analgesic   27. Automatic implantable cardioverter-defibrillator in situ   28. Chronic anticoagulation (Coumadin)   29. History of thrombocytopenia   30. OSA treated with BiPAP   31. Problems influencing health status   32. History of claustrophobia   33. Generalized anxiety disorder    Problems updated and reviewed during this visit: Problem  Neurogenic Pain  Muscle weakness of lower extremity (Bilateral) (L>R)  Chronic sacroiliac joint pain (Left)  Lumbar facet joint syndrome (Bilateral) (L>R)  Lumbar Discogenic Pain Syndrome  Ddd (Degenerative Disc Disease), Cervical  Radicular pain of shoulder (Bilateral)  Cervical Radiculitis  Other Intervertebral Disc Degeneration, Lumbar Region  Cervicalgia  Other Cervical Disc Degeneration, Unspecified Cervical Region  Chronic low back pain (Primary Area of Pain) (Bilateral)  Chronic neck pain (Secondary Area of Pain) (Bilateral) (L>R)  Chronic shoulder pain (Tertiary Area of Pain) (Bilateral) (L>R)  Chronic Pain Syndrome  Chronic Gouty Arthritis   Mononeuropathy Due to Underlying Disease  Lumbar Stenosis With Neurogenic Claudication  Ddd (Degenerative Disc Disease), Lumbar  Lumbar Radiculitis  Positive Ana (Antinuclear Antibody)  History of Claustrophobia  Elevated C-Reactive Protein (Crp)  Elevated Sed Rate  Long Term Current Use of Opiate Analgesic  Disorder of Skeletal System  Problems Influencing Health Status  Pharmacologic Therapy  Thyroid Mass of Unclear Etiology  Aortic Insufficiency  Constipation  History of Thrombocytopenia  History of GI Bleed   Added automatically from request for surgery [id number]   Chronic anticoagulation (Coumadin)   Coumadin (warfarin) anticoagulation (stop for 5 days prior to procedures and restart 2 hours after intervention)   Pulmonary Emphysema (Hcc)  Lvad (Left Ventricular Assist Device) Present (Hcc)   HM2 LVAD implanted 09/21/14   Osa Treated With Bipap  Ischemic Cardiomyopathy   1. 03/2013 OSH echo: EF <15%. Global dysfunction. LVIDd 6.3 cm. Mod -severe MR and TR. RVSP 42 mmHg.  2.  [facility] echo 07/02/2013: moderately reduced LV systolic function with EF 35% associated with moferate LV enlargement and global hypokinesis as well as grade II diastolic dysfunction, moderate RV enlargement with normal overall contractility, bi-atrial enlargement, mild TR with peak RVSP of 33.   Automatic Implantable Cardioverter-Defibrillator in Situ   Medtronic Protecta [id number] defibrillator implanted 04/05/10 with Medtronic 5076 atrial lead, implanted 03/23/03 and Medtronic 6935 ventricular lead, implanted 04/05/10.   Medtronic Evera [id number] defibrillator, implanted 09/10/2016, with Medtronic 5076 atrial lead, implanted 03/23/2003, and Medtronic 6935 ventricular lead, implanted 04/05/2010.  FDA safety communication for potential cybersecurity vulnerabilities involving Medtronic Conexus telemetry and affecting Medtronic ICDs and CRT-Ds. Awaiting software update from Med[employer].   Steatohepatitis    Biopsy-proven   Generalized Anxiety Disorder  Hyperlipidemia   and hypertriglyceridemia 03/23/11:  Fenofibrate 168mm added.   Influenza A  On Amiodarone Therapy  Type 2 Diabetes Mellitus Without Complication, Without Long-Term Current Use of Insulin (Hcc)  Complication Involving Left Ventricular Assist Device (Lvad)  Acute On Chronic Respiratory Failure With Hypercapnia (Hcc)  Epistaxis   Added automatically from request for surgery [id number]  Acute On Chronic Systolic Heart Failure (Hcc)  Nsvt (Nonsustained Ventricular Tachycardia) (Hcc)  Hoh (Hard of Hearing)  Patient in Clinical Research Study  Anxiety  Leukocytosis  Iron Deficiency Anemia Due to Chronic Blood Loss  Chronic Systolic Chf (Congestive Heart Failure) (Hcc)  Encounter for Fitting Or Adjustment of Automatic Implantable Cardioverter-Defibrillator  Hypertriglyceridemia  Pvc's (Premature Ventricular Contractions)  Depression  Valvular Regurgitation  Presence of Drug Coated Stent in Left Circumflex Coronary Artery   02/17/2013:  Percutaneous coronary intervention performed of the 90% SVG to OM-2 lesion.  4.0 mm x 18 mm Xience Xpedition DES;  Results: 10%   Essential Hypertension  H/O Ventricular Fibrillation   History of ventricular fibrillation arrest during catheterization in 1993.   Paroxysmal Atrial Fibrillation (Hcc)   April 2009, hospitalized for afib/flutter.  DC cardioversion.  Discharged on Warfarin.        b.  Warfarin discontinued July 2012 at which time Plavix continues.   Cad (Coronary Artery Disease)   a.  Coronary artery bypass grafting 02/20/1990, SVG to OM1, SVG to PDA, SVG to D1, LIMA to LAD.  b.  PTCA of SVG to RPDA 12/17/1990.  c.  PTCA of SVG to RPDA 06/29/1991.  d.  PTCA with laser and stent of saphenous vein graft to OM2, 08/13/2002.  e.  Cardiac Cath 10/14/2002, 100% proximal RCA,100% mid left circ, 2 mid LAD lesions,100% 3 grafts patent.  f.  August 2006, Adenosine nuclear stress test:  infarct noted in the posterior basal inferior apical inferoseptal region.  EKG negative.  Post stress EF 33%.    g.  December 2008, exercise nuclear stress tests:  infart in posterior basal, inferior inferior apical and inferoseptal region, EKG  positive for 0.19 mV in leads 2, AVF, V5 and V6, post stress EF 23%, septal dyskinesis with anterior, inferior, apical and lateral severe hypokinesis.  h.  September 2012, exercise nuclear stress test:  (results pending).  I.  PCI 02/19/2011, Xience V DES 3.5x142m(SAdah Salvagein SVG-OM3 (large branch) J. 02/17/2013: Cardiac Cath for chest pain: Left main: 50%;  LAD 80% prox, 100% mid, 80% septal perforator;  LCX 70/90% prox, 100% mid;  RCA 100% prox. LIMA to LAD patent; SVG to OM-2: 90% body s/p Xience;  SVG to RCA: 100%;  SVG to D2:100%.    Hyperlipidemia (Resolved)   Overview:  and hypertriglyceridemia 03/23/11:  Fenofibrate 14511madded.    Time Note: Greater than 50% of the 40 minute(s) of face-to-face time spent with William Bray spent in counseling/coordination of care regarding: William Bray cause of pain, the results of his recent test(s), the significance of each one oth the test(s) anomalies and it's corresponding characteristic pain pattern(s), treatment alternatives, the risks and possible complications of proposed treatment, medication side effects, realistic expectations and the need to collect and read the AVS material. Plan of Care  Pharmacotherapy (Medications Ordered): Meds ordered this encounter  Medications  .  diazepam (VALIUM) 5 MG tablet    Sig: Take 1 tablet (5 mg total) by mouth as needed for up to 2 doses for anxiety (Take one tab 45 minutes before MRI. Take second tablet just prior to MRI scan). Do not take medication within 4 hours of taking opioid pain medications. Must have a driver. Do not drive or operate machinery x 24 hours after taking this medication.    Dispense:  2 tablet    Refill:  0    Must have a driver.  Do not drive or operate machinery x 24 hours after taking this medication.   Procedure Orders    No procedure(s) ordered today   Lab Orders  No laboratory test(s) ordered today    Imaging Orders     CT LUMBAR SPINE WO CONTRAST     CT CERVICAL SPINE WO CONTRAST  Referral Orders     Ambulatory referral to Physical Therapy  Pharmacological management options:  Opioid Analgesics: I will not be prescribing any opioids at this time Membrane stabilizer: Will not be prescribed. Muscle relaxant: Will not be prescribed. NSAID: Will not be prescribed. Other analgesic(s): The patient was given a single prescription for Valium to be use for his anxiety during the CT scans.   Interventional management options: Planned, scheduled, and/or pending:    Today we will order an EMG/PNCV to evaluate his bilateral lower extremity weakness.  CT of the cervical spine to evaluate his decreased range of motion, cervicalgia, and bilateral radicular shoulder pain.  CT of the lumbar spine to evaluate his low back pain and bilateral lower extremity weakness.  Not an ideal candidate for opioid therapy secondary to severe COPD, hypercapnia, oxygen dependence, and sleep apnea.  Not an ideal candidate for interventional therapies secondary to Coumadin anticoagulation. The patient's wife indicates that his unable to come off of the Coumadin for any amount of time, for any reason. We will send a request for clearance from the prescribing physician to see if this is accurate. In any case, today I have informed them of the increased risk of thromboembolism and vessel occlusion with possible death, whenever we stop his blood thinners.    Considering:   The list below is valid only if he is able to safely stop his coumadin x 5 days prior to procedures. Diagnostic bilateral lumbar facet nerve block  Possible bilateral lumbar facet RFA  Diagnostic bilateral cervical facet nerve block  Possible bilateral cervical facet RFA   Diagnostic bilateral intra-articular shoulder injections  Diagnostic bilateral suprascapular nerve block  Possible bilateral suprascapular nerve RFA    PRN Procedures:   None at this time   Provider-requested follow-up: Return for F/U eval (after test completion).  Future Appointments  Date Time Provider [place]  11/22/2017 10:00 AM [facility] None  11/25/2017 10:00 AM [facility] None  11/27/2017 10:00 AM [facility] None  11/29/2017 10:00 AM [facility] None  12/02/2017 10:00 AM [facility] None  12/04/2017 10:00 AM [facility] None  12/06/2017 10:00 AM [facility] None  12/09/2017 10:00 AM [facility] None  12/11/2017  9:15 AM Milinda Pointer, MD [facility] None  12/11/2017 10:00 AM [facility] None  12/13/2017 10:00 AM [facility] None  12/16/2017 10:00 AM [facility] None  12/18/2017 10:00 AM [facility]  [facility] None  12/20/2017 10:00 AM [facility] None  12/23/2017 10:00 AM [facility] None  12/25/2017 10:00 AM [facility] None  12/27/2017 10:00 AM [facility] None  12/30/2017 10:00 AM [facility] None    Primary Care Physician: Juluis Pitch, MD Location: [facility] Note by: Gaspar Cola, MD Date: 11/20/2017; Time: 10:25 AM

## 2017-11-20 ENCOUNTER — Ambulatory Visit: Payer: Medicare Other | Attending: Pain Medicine | Admitting: Pain Medicine

## 2017-11-20 ENCOUNTER — Telehealth: Payer: Self-pay

## 2017-11-20 ENCOUNTER — Other Ambulatory Visit: Payer: Self-pay

## 2017-11-20 ENCOUNTER — Encounter: Payer: Self-pay | Admitting: Pain Medicine

## 2017-11-20 VITALS — HR 94 | Temp 97.7°F | Resp 16 | Ht 70.0 in | Wt 180.0 lb

## 2017-11-20 DIAGNOSIS — M48062 Spinal stenosis, lumbar region with neurogenic claudication: Secondary | ICD-10-CM | POA: Diagnosis not present

## 2017-11-20 DIAGNOSIS — R7982 Elevated C-reactive protein (CRP): Secondary | ICD-10-CM

## 2017-11-20 DIAGNOSIS — E079 Disorder of thyroid, unspecified: Secondary | ICD-10-CM | POA: Insufficient documentation

## 2017-11-20 DIAGNOSIS — M5030 Other cervical disc degeneration, unspecified cervical region: Secondary | ICD-10-CM

## 2017-11-20 DIAGNOSIS — M533 Sacrococcygeal disorders, not elsewhere classified: Secondary | ICD-10-CM

## 2017-11-20 DIAGNOSIS — G59 Mononeuropathy in diseases classified elsewhere: Secondary | ICD-10-CM

## 2017-11-20 DIAGNOSIS — E1141 Type 2 diabetes mellitus with diabetic mononeuropathy: Secondary | ICD-10-CM | POA: Diagnosis not present

## 2017-11-20 DIAGNOSIS — D500 Iron deficiency anemia secondary to blood loss (chronic): Secondary | ICD-10-CM | POA: Insufficient documentation

## 2017-11-20 DIAGNOSIS — R760 Raised antibody titer: Secondary | ICD-10-CM | POA: Insufficient documentation

## 2017-11-20 DIAGNOSIS — Z79891 Long term (current) use of opiate analgesic: Secondary | ICD-10-CM

## 2017-11-20 DIAGNOSIS — M5116 Intervertebral disc disorders with radiculopathy, lumbar region: Secondary | ICD-10-CM | POA: Insufficient documentation

## 2017-11-20 DIAGNOSIS — M545 Low back pain: Secondary | ICD-10-CM | POA: Diagnosis present

## 2017-11-20 DIAGNOSIS — H9190 Unspecified hearing loss, unspecified ear: Secondary | ICD-10-CM | POA: Insufficient documentation

## 2017-11-20 DIAGNOSIS — G4733 Obstructive sleep apnea (adult) (pediatric): Secondary | ICD-10-CM

## 2017-11-20 DIAGNOSIS — I110 Hypertensive heart disease with heart failure: Secondary | ICD-10-CM | POA: Insufficient documentation

## 2017-11-20 DIAGNOSIS — M1A00X0 Idiopathic chronic gout, unspecified site, without tophus (tophi): Secondary | ICD-10-CM

## 2017-11-20 DIAGNOSIS — M51369 Other intervertebral disc degeneration, lumbar region without mention of lumbar back pain or lower extremity pain: Secondary | ICD-10-CM

## 2017-11-20 DIAGNOSIS — M1A9XX0 Chronic gout, unspecified, without tophus (tophi): Secondary | ICD-10-CM

## 2017-11-20 DIAGNOSIS — Z7982 Long term (current) use of aspirin: Secondary | ICD-10-CM | POA: Insufficient documentation

## 2017-11-20 DIAGNOSIS — Z87891 Personal history of nicotine dependence: Secondary | ICD-10-CM | POA: Insufficient documentation

## 2017-11-20 DIAGNOSIS — R7689 Other specified abnormal immunological findings in serum: Secondary | ICD-10-CM

## 2017-11-20 DIAGNOSIS — M5416 Radiculopathy, lumbar region: Secondary | ICD-10-CM

## 2017-11-20 DIAGNOSIS — M5412 Radiculopathy, cervical region: Secondary | ICD-10-CM

## 2017-11-20 DIAGNOSIS — M5136 Other intervertebral disc degeneration, lumbar region with discogenic back pain only: Secondary | ICD-10-CM

## 2017-11-20 DIAGNOSIS — M25512 Pain in left shoulder: Secondary | ICD-10-CM | POA: Insufficient documentation

## 2017-11-20 DIAGNOSIS — Z7951 Long term (current) use of inhaled steroids: Secondary | ICD-10-CM | POA: Insufficient documentation

## 2017-11-20 DIAGNOSIS — M792 Neuralgia and neuritis, unspecified: Secondary | ICD-10-CM

## 2017-11-20 DIAGNOSIS — E785 Hyperlipidemia, unspecified: Secondary | ICD-10-CM | POA: Insufficient documentation

## 2017-11-20 DIAGNOSIS — G894 Chronic pain syndrome: Secondary | ICD-10-CM

## 2017-11-20 DIAGNOSIS — I5022 Chronic systolic (congestive) heart failure: Secondary | ICD-10-CM | POA: Insufficient documentation

## 2017-11-20 DIAGNOSIS — Z8249 Family history of ischemic heart disease and other diseases of the circulatory system: Secondary | ICD-10-CM | POA: Diagnosis not present

## 2017-11-20 DIAGNOSIS — Z79899 Other long term (current) drug therapy: Secondary | ICD-10-CM | POA: Diagnosis not present

## 2017-11-20 DIAGNOSIS — G8929 Other chronic pain: Secondary | ICD-10-CM

## 2017-11-20 DIAGNOSIS — F40240 Claustrophobia: Secondary | ICD-10-CM | POA: Insufficient documentation

## 2017-11-20 DIAGNOSIS — F411 Generalized anxiety disorder: Secondary | ICD-10-CM | POA: Diagnosis not present

## 2017-11-20 DIAGNOSIS — Z91030 Bee allergy status: Secondary | ICD-10-CM | POA: Insufficient documentation

## 2017-11-20 DIAGNOSIS — Z888 Allergy status to other drugs, medicaments and biological substances status: Secondary | ICD-10-CM | POA: Insufficient documentation

## 2017-11-20 DIAGNOSIS — M47816 Spondylosis without myelopathy or radiculopathy, lumbar region: Secondary | ICD-10-CM

## 2017-11-20 DIAGNOSIS — M5126 Other intervertebral disc displacement, lumbar region: Secondary | ICD-10-CM | POA: Diagnosis not present

## 2017-11-20 DIAGNOSIS — R768 Other specified abnormal immunological findings in serum: Secondary | ICD-10-CM

## 2017-11-20 DIAGNOSIS — Z7901 Long term (current) use of anticoagulants: Secondary | ICD-10-CM

## 2017-11-20 DIAGNOSIS — M5010 Cervical disc disorder with radiculopathy, unspecified cervical region: Secondary | ICD-10-CM | POA: Diagnosis not present

## 2017-11-20 DIAGNOSIS — M79671 Pain in right foot: Secondary | ICD-10-CM | POA: Insufficient documentation

## 2017-11-20 DIAGNOSIS — M25511 Pain in right shoulder: Secondary | ICD-10-CM | POA: Diagnosis not present

## 2017-11-20 DIAGNOSIS — M542 Cervicalgia: Secondary | ICD-10-CM | POA: Diagnosis not present

## 2017-11-20 DIAGNOSIS — Z862 Personal history of diseases of the blood and blood-forming organs and certain disorders involving the immune mechanism: Secondary | ICD-10-CM

## 2017-11-20 DIAGNOSIS — M899 Disorder of bone, unspecified: Secondary | ICD-10-CM

## 2017-11-20 DIAGNOSIS — K5900 Constipation, unspecified: Secondary | ICD-10-CM | POA: Insufficient documentation

## 2017-11-20 DIAGNOSIS — M6281 Muscle weakness (generalized): Secondary | ICD-10-CM | POA: Diagnosis not present

## 2017-11-20 DIAGNOSIS — Z95811 Presence of heart assist device: Secondary | ICD-10-CM | POA: Insufficient documentation

## 2017-11-20 DIAGNOSIS — M79672 Pain in left foot: Secondary | ICD-10-CM | POA: Insufficient documentation

## 2017-11-20 DIAGNOSIS — Z95810 Presence of automatic (implantable) cardiac defibrillator: Secondary | ICD-10-CM

## 2017-11-20 DIAGNOSIS — I351 Nonrheumatic aortic (valve) insufficiency: Secondary | ICD-10-CM | POA: Insufficient documentation

## 2017-11-20 DIAGNOSIS — Z8659 Personal history of other mental and behavioral disorders: Secondary | ICD-10-CM | POA: Insufficient documentation

## 2017-11-20 DIAGNOSIS — Z789 Other specified health status: Secondary | ICD-10-CM

## 2017-11-20 DIAGNOSIS — Z955 Presence of coronary angioplasty implant and graft: Secondary | ICD-10-CM | POA: Insufficient documentation

## 2017-11-20 DIAGNOSIS — R700 Elevated erythrocyte sedimentation rate: Secondary | ICD-10-CM

## 2017-11-20 MED ORDER — DIAZEPAM 5 MG PO TABS: 5.0000 mg | ORAL_TABLET | ORAL | 0 refills | Status: DC | PRN

## 2017-11-20 NOTE — Progress Notes (Signed)
Safety precautions to be maintained throughout the outpatient stay will include: orient to surroundings, keep bed in low position, maintain call bell within reach at all times, provide assistance with transfer out of bed and ambulation.  

## 2017-11-20 NOTE — Patient Instructions (Signed)
____________________________________________________________________________________________  Blood Thinners  Recommended Time Interval Before and After Neuraxial Block or Catheter Removal  Drug (Generic) Brand Name Time Before Time After Comments  Abciximab Reopro 15 days 2 hours   Alteplase Activase 10 days 10 days   Apixaban Eliquis 3 days 6 hours   Aspirin > 325 mg Goody Powders/Excedrin 11 days  (Usually not stopped)  Aspirin ? 81 mg  7 days  (Usually not stopped)  Cholesterol Medication Lipitor 4 days    Cilostazol Pletal 3 days 5 hours   Clopidogrel Plavix 7-10 days 2 hours   Dabigatran Pradaxa 5 days 6 hours   Delteparin Fragmin 24 hours 4 hours   Dipyridamole + ASA Aggrenox 11days 2 hours   Enoxaparin  Lovenox 24 hours 4 hours   Eptifibatide Integrillin 8 hours 2 hours   Fish oil  4 days    Fondaparinux  Arixtra 72 hours 12 hours   Garlic supplements  7 days    Ginkgo biloba  36 hours    Ginseng  24 hours    Heparin (IV)  4 hours 2 hours   Heparin ([place])  12 hours 2 hours   Hydroxychloroquine Plaquenil 11 days    LMW Heparin  24 hours    LMWH  24 hours    NSAIDs  3 days  (Usually not stopped)  Prasugrel Effient 7-10 days 6 hours   Reteplase Retavase 10 days 10 days   Rivaroxaban Xarelto 3 days 6 hours   Streptokinase Streptase 10 days 10 days   Tenecteplase TNKase 10 days 10 days   Thrombolytics  10 days  10 days Avoid x 10 days after inj.  Ticagrelor Brilinta 5-7 days 6 hours   Ticlodipine Ticlid 10-14 days 2 hours   Tinzaparin Innohep 24 hours 4 hours   Tirofiban Aggrastat 8 hours 2 hours   Vitamin E  4 days    Warfarin Coumadin 5 days 2 hours   ____________________________________________________________________________________________

## 2017-11-20 NOTE — Telephone Encounter (Signed)
Patient was call to pick up his prescription for diazepam before his CT scan.

## 2017-11-21 ENCOUNTER — Telehealth: Payer: Self-pay | Admitting: *Deleted

## 2017-11-21 NOTE — Telephone Encounter (Signed)
Spoke with Rennie Natter at [facility].  States she received a form from [place] requesting medical clearance and clearace to stop blood thinner for 5 days.  States she needed more information.  Informed patient that there were no orders for a procedure and that we were getting approval as per our protocol.  She states that the patient has a mechanical heart pump and that unless we could resuscitate him then we should not be doing it.  Will inform Dr Dossie Arbour.

## 2017-11-25 ENCOUNTER — Encounter: Payer: Medicare Other | Attending: Pulmonary Disease

## 2017-11-25 DIAGNOSIS — Z79899 Other long term (current) drug therapy: Secondary | ICD-10-CM | POA: Insufficient documentation

## 2017-11-25 DIAGNOSIS — E119 Type 2 diabetes mellitus without complications: Secondary | ICD-10-CM | POA: Insufficient documentation

## 2017-11-25 DIAGNOSIS — Z79891 Long term (current) use of opiate analgesic: Secondary | ICD-10-CM | POA: Insufficient documentation

## 2017-11-25 DIAGNOSIS — Z7982 Long term (current) use of aspirin: Secondary | ICD-10-CM | POA: Insufficient documentation

## 2017-11-25 DIAGNOSIS — J449 Chronic obstructive pulmonary disease, unspecified: Secondary | ICD-10-CM | POA: Insufficient documentation

## 2017-11-25 DIAGNOSIS — Z87891 Personal history of nicotine dependence: Secondary | ICD-10-CM | POA: Insufficient documentation

## 2017-11-25 DIAGNOSIS — I10 Essential (primary) hypertension: Secondary | ICD-10-CM | POA: Insufficient documentation

## 2017-11-25 DIAGNOSIS — Z7901 Long term (current) use of anticoagulants: Secondary | ICD-10-CM | POA: Insufficient documentation

## 2017-11-25 NOTE — Progress Notes (Signed)
Pulmonary Individual Treatment Plan  Patient Details  Name: William Bray MRN: [id number] Date of Birth: [birth year] Referring Provider:     Pulmonary Rehab from 09/30/2017 in [facility] Cardiac and Pulmonary Rehab  Referring Provider  [name] MD      Initial Encounter Date:    Pulmonary Rehab from 09/30/2017 in [facility] Cardiac and Pulmonary Rehab  Date  09/30/17      Visit Diagnosis: Chronic obstructive pulmonary disease, unspecified COPD type ([place])  Patient's Home Medications on Admission:  Current Outpatient Medications:  .  albuterol (PROVENTIL) (2.5 MG/3ML) 0.083% nebulizer solution, Inhale 3 mLs into the lungs every 6 (six) hours as needed., Disp: , Rfl:  .  allopurinol (ZYLOPRIM) 100 MG tablet, Take 100 mg by mouth daily., Disp: , Rfl:  .  ALPRAZolam (XANAX) 0.5 MG tablet, Take 0.5 mg by mouth 2 (two) times daily as needed. ONE IN THE MORNING AND ONE AT BEDTIME PRN, Disp: , Rfl:  .  aspirin EC 81 MG tablet, Take 81 mg by mouth daily., Disp: , Rfl:  .  budesonide (PULMICORT) 0.5 MG/2ML nebulizer solution, Inhale 2 mLs into the lungs 2 (two) times daily., Disp: , Rfl:  .  cetirizine (ZYRTEC) 10 MG tablet, Take 10 mg by mouth daily., Disp: , Rfl:  .  cyanocobalamin 100 MCG tablet, Take 100 mcg by mouth daily., Disp: , Rfl:  .  diazepam (VALIUM) 5 MG tablet, Take 1 tablet (5 mg total) by mouth as needed for up to 2 doses for anxiety (Take one tab 45 minutes before MRI. Take second tablet just prior to MRI scan). Do not take medication within 4 hours of taking opioid pain medications. Must have a driver. Do not drive or operate machinery x 24 hours after taking this medication., Disp: 2 tablet, Rfl: 0 .  ferrous sulfate 324 (65 Fe) MG TBEC, Take 324 mg by mouth every morning. WITH BREAKFAST, Disp: , Rfl:  .  FLUoxetine (PROZAC) 20 MG capsule, Take 80 mg by mouth daily., Disp: , Rfl:  .  gabapentin (NEURONTIN) 300 MG capsule, Take 600 mg by mouth 3 (three) times daily. , Disp: , Rfl:  .   lisinopril (PRINIVIL,ZESTRIL) 2.5 MG tablet, Take 2.5 mg by mouth daily., Disp: , Rfl:  .  magnesium oxide (MAG-OX) 400 MG tablet, Take 400 mg by mouth daily., Disp: , Rfl:  .  metoprolol tartrate (LOPRESSOR) 25 MG tablet, Take 25 mg by mouth 2 (two) times daily., Disp: , Rfl:  .  mirtazapine (REMERON) 30 MG tablet, Take 0.5 tablets by mouth at bedtime., Disp: , Rfl:  .  montelukast (SINGULAIR) 10 MG tablet, Take 10 mg by mouth at bedtime., Disp: , Rfl:  .  Multiple Vitamins-Minerals (MULTIVITAMIN WITH MINERALS) tablet, Take 1 tablet by mouth daily., Disp: , Rfl:  .  ondansetron (ZOFRAN) 4 MG tablet, Take 4 mg by mouth every 8 (eight) hours as needed for nausea., Disp: , Rfl:  .  pantoprazole (PROTONIX) 40 MG tablet, Take 40 mg by mouth daily., Disp: , Rfl:  .  pravastatin (PRAVACHOL) 40 MG tablet, Take 40 mg by mouth at bedtime., Disp: , Rfl:  .  spironolactone (ALDACTONE) 25 MG tablet, Take 25 mg by mouth daily., Disp: , Rfl:  .  torsemide (DEMADEX) 20 MG tablet, Take 2 tablets by mouth daily., Disp: , Rfl:  .  umeclidinium-vilanterol (ANORO ELLIPTA) 62.5-25 MCG/INH AEPB, Inhale 1 puff into the lungs daily., Disp: , Rfl:  .  warfarin (COUMADIN) 3 MG tablet,  Take 3 mg by mouth 3 (three) times a week., Disp: , Rfl:  .  warfarin (COUMADIN) 4 MG tablet, Take 4 mg by mouth 4 (four) times a week., Disp: , Rfl:  .  warfarin (COUMADIN) 5 MG tablet, Take 5 mg by mouth as directed. Taking 5 mg M, W, F and 4 mg T, Th, Sat, Sun., Disp: , Rfl:   Past Medical History: Past Medical History:  Diagnosis Date  . Arthritis   . Chronic back pain   . Diabetes mellitus without complication ([place])   . Hypertension   . Neuropathy     Tobacco Use: Social History   Tobacco Use  Smoking Status Former Smoker  . Packs/day: 1.00  . Years: 45.00  . Pack years: 45.00  . Types: Cigarettes  . Last attempt to quit: 05/28/2005  . Years since quitting: 12.5  Smokeless Tobacco Never Used    Labs: Recent Review  Flowsheet Data    Labs for ITP Cardiac and Pulmonary Rehab Latest Ref Rng & Units 08/08/2012 04/28/2013 11/05/2013 05/30/2014 06/02/2014   Cholestrol 0 - 200 mg/dL 105 - 127 64 -   LDLCALC 0 - 100 mg/dL 37 - 74 27 -   HDL 40 - 60 mg/dL 33(L) - 33(L) 15(L) -   Trlycerides 0 - 200 mg/dL 173 - 99 108 -   Hemoglobin A1c 4.2 - 6.3 % - 5.9 - - 5.3       Pulmonary Assessment Scores: Pulmonary Assessment Scores    Row Name 09/30/17 1428         ADL UCSD   ADL Phase  Entry     SOB Score total  73     Rest  1     Walk  2     Stairs  5     Bath  2     Dress  3     Shop  5       CAT Score   CAT Score  22       mMRC Score   mMRC Score  3        Pulmonary Function Assessment: Pulmonary Function Assessment - 09/30/17 1429      Initial Spirometry Results   FVC%  54 %    FEV1%  29 %    FEV1/FVC Ratio  39.67    Comments  test done on 08/28/16      Post Bronchodilator Spirometry Results   FVC%  64 %    FEV1%  37 %    FEV1/FVC Ratio  38.57    Comments  test done on 08/28/16      Breath   Shortness of Breath  Yes;Limiting activity;Fear of Shortness of Breath       Exercise Target Goals:    Exercise Program Goal: Individual exercise prescription set using results from initial 6 min walk test and THRR while considering  patient's activity barriers and safety.    Exercise Prescription Goal: Initial exercise prescription builds to 30-45 minutes a day of aerobic activity, 2-3 days per week.  Home exercise guidelines will be given to patient during program as part of exercise prescription that the participant will acknowledge.  Activity Barriers & Risk Stratification: Activity Barriers & Cardiac Risk Stratification - 09/30/17 1555      Activity Barriers & Cardiac Risk Stratification   Activity Barriers  Deconditioning;Muscular Weakness;Shortness of Breath;Decreased Ventricular Function;Balance Concerns;Back Problems chronic back pain       6 Minute Walk: 6 Minute Walk  [place] Name  09/30/17 1550         6 Minute Walk   Phase  Initial     Distance  625 feet     Walk Time  3.9 minutes     # of Rest Breaks  4 15 sec, 44 sec, 49 sec, 20 sec     MPH  1.82     METS  2     RPE  17     Perceived Dyspnea   3     VO2 Peak  6.99     Symptoms  Yes (comment)     Comments  SOB, back pain 8/10     Resting HR  78 bpm     Resting BP  - 80 dopplar     Resting Oxygen Saturation   96 %     Exercise Oxygen Saturation  during 6 min walk  83 %     Max Ex. HR  121 bpm     Max Ex. BP  - 112 dopplar     2 Minute Post BP  - 114 dopplar, rck 106       Interval HR   4 Minute HR  117     5 Minute HR  121     6 Minute HR  117     2 Minute Post HR  103     Interval Heart Rate?  Yes       Interval Oxygen   Interval Oxygen?  Yes     Baseline Oxygen Saturation %  96 %     1 Minute Liters of Oxygen  4 L pulsed     2 Minute Liters of Oxygen  4 L     3 Minute Liters of Oxygen  4 L     4 Minute Oxygen Saturation %  86 %     4 Minute Liters of Oxygen  5 L pt increased himself     5 Minute Oxygen Saturation %  83 %     5 Minute Liters of Oxygen  5 L     6 Minute Oxygen Saturation %  89 % 85% once seated     6 Minute Liters of Oxygen  5 L     2 Minute Post Oxygen Saturation %  90 %     2 Minute Post Liters of Oxygen  4 L       Oxygen Initial Assessment: Oxygen Initial Assessment - 09/30/17 1436      Home Oxygen   Home Oxygen Device  Home Concentrator;Portable Concentrator;E-Tanks    Sleep Oxygen Prescription  Continuous;CPAP    Liters per minute  2    Home Exercise Oxygen Prescription  Continuous    Liters per minute  2    Home at Rest Exercise Oxygen Prescription  Continuous    Liters per minute  2    Compliance with Home Oxygen Use  Yes      Initial 6 min Walk   Oxygen Used  Portable Concentrator;Pulsed    Liters per minute  3      Program Oxygen Prescription   Program Oxygen Prescription  Continuous;E-Tanks    Liters per minute  3      Intervention   Short Term  Goals  To learn and exhibit compliance with exercise, home and travel O2 prescription;To learn and understand importance of maintaining oxygen saturations>88%;To learn and demonstrate proper use of respiratory medications;To learn and demonstrate proper pursed lip breathing techniques or  other breathing techniques.;To learn and understand importance of monitoring SPO2 with pulse oximeter and demonstrate accurate use of the pulse oximeter.    Long  Term Goals  Exhibits compliance with exercise, home and travel O2 prescription;Verbalizes importance of monitoring SPO2 with pulse oximeter and return demonstration;Maintenance of O2 saturations>88%;Exhibits proper breathing techniques, such as pursed lip breathing or other method taught during program session;Compliance with respiratory medication;Demonstrates proper use of MDI's       Oxygen Re-Evaluation: Oxygen Re-Evaluation    Row Name 10/04/17 1048 11/06/17 1028           Program Oxygen Prescription   Program Oxygen Prescription  -  Continuous;E-Tanks      Liters per minute  -  3        Home Oxygen   Home Oxygen Device  -  Home Concentrator;Portable Concentrator;E-Tanks      Sleep Oxygen Prescription  -  Continuous;CPAP      Liters per minute  -  2      Home Exercise Oxygen Prescription  -  Continuous      Liters per minute  -  2      Home at Rest Exercise Oxygen Prescription  -  Continuous      Liters per minute  -  2      Compliance with Home Oxygen Use  -  Yes        Goals/Expected Outcomes   Short Term Goals  To learn and exhibit compliance with exercise, home and travel O2 prescription;To learn and understand importance of maintaining oxygen saturations>88%;To learn and demonstrate proper use of respiratory medications;To learn and demonstrate proper pursed lip breathing techniques or other breathing techniques.;To learn and understand importance of monitoring SPO2 with pulse oximeter and demonstrate accurate use of the pulse  oximeter.  To learn and exhibit compliance with exercise, home and travel O2 prescription;To learn and understand importance of maintaining oxygen saturations>88%;To learn and demonstrate proper use of respiratory medications;To learn and demonstrate proper pursed lip breathing techniques or other breathing techniques.;To learn and understand importance of monitoring SPO2 with pulse oximeter and demonstrate accurate use of the pulse oximeter.      Long  Term Goals  Exhibits compliance with exercise, home and travel O2 prescription;Verbalizes importance of monitoring SPO2 with pulse oximeter and return demonstration;Maintenance of O2 saturations>88%;Exhibits proper breathing techniques, such as pursed lip breathing or other method taught during program session;Compliance with respiratory medication;Demonstrates proper use of MDI's  Exhibits compliance with exercise, home and travel O2 prescription;Verbalizes importance of monitoring SPO2 with pulse oximeter and return demonstration;Maintenance of O2 saturations>88%;Exhibits proper breathing techniques, such as pursed lip breathing or other method taught during program session;Compliance with respiratory medication;Demonstrates proper use of MDI's      Comments  Reviewed PLB technique with pt.  Talked about how it work and it's important to maintaining his exercise saturations.  William Bray is taking Pulmicort nebulizer and has an albuterol inhaler. He states when he is resting he uses 1.5-2 liters of oxygen. He checks his oxygen at home and he is 34-96 percent. He wants to be off oxygen all together. We will try to decrease his oxygen by 1 liter on some exercise machines, check his oxygen and start weaning.      Goals/Expected Outcomes  Short: Become more profiecient at using PLB.   Long: Become independent at using PLB.  Short: decrease oxygen by 1 liter while exercising. Long: Use no oxygen while at rest.  Oxygen Discharge (Final Oxygen  Re-Evaluation): Oxygen Re-Evaluation - 11/06/17 1028      Program Oxygen Prescription   Program Oxygen Prescription  Continuous;E-Tanks    Liters per minute  3      Home Oxygen   Home Oxygen Device  Home Concentrator;Portable Concentrator;E-Tanks    Sleep Oxygen Prescription  Continuous;CPAP    Liters per minute  2    Home Exercise Oxygen Prescription  Continuous    Liters per minute  2    Home at Rest Exercise Oxygen Prescription  Continuous    Liters per minute  2    Compliance with Home Oxygen Use  Yes      Goals/Expected Outcomes   Short Term Goals  To learn and exhibit compliance with exercise, home and travel O2 prescription;To learn and understand importance of maintaining oxygen saturations>88%;To learn and demonstrate proper use of respiratory medications;To learn and demonstrate proper pursed lip breathing techniques or other breathing techniques.;To learn and understand importance of monitoring SPO2 with pulse oximeter and demonstrate accurate use of the pulse oximeter.    Long  Term Goals  Exhibits compliance with exercise, home and travel O2 prescription;Verbalizes importance of monitoring SPO2 with pulse oximeter and return demonstration;Maintenance of O2 saturations>88%;Exhibits proper breathing techniques, such as pursed lip breathing or other method taught during program session;Compliance with respiratory medication;Demonstrates proper use of MDI's    Comments  William Bray is taking Pulmicort nebulizer and has an albuterol inhaler. He states when he is resting he uses 1.5-2 liters of oxygen. He checks his oxygen at home and he is 34-96 percent. He wants to be off oxygen all together. We will try to decrease his oxygen by 1 liter on some exercise machines, check his oxygen and start weaning.    Goals/Expected Outcomes  Short: decrease oxygen by 1 liter while exercising. Long: Use no oxygen while at rest.       Initial Exercise Prescription: Initial Exercise Prescription -  09/30/17 1500      Date of Initial Exercise RX and Referring Provider   Date  09/30/17    Referring Provider  [name] MD      Oxygen   Oxygen  Continuous    Liters  4      Treadmill   MPH  1.3    Grade  0    Minutes  15    METs  2      NuStep   Level  2    SPM  80    Minutes  15    METs  2      REL-XR   Level  1    Speed  50    Minutes  15    METs  2      Prescription Details   Frequency (times per week)  3    Duration  Progress to 45 minutes of aerobic exercise without signs/symptoms of physical distress      Intensity   THRR 40-80% of Max Heartrate  108-138    Ratings of Perceived Exertion  11-13    Perceived Dyspnea  0-4      Progression   Progression  Continue to progress workloads to maintain intensity without signs/symptoms of physical distress.      Resistance Training   Training Prescription  Yes    Weight  4 lbs    Reps  10-15       Perform Capillary Blood Glucose checks as needed.  Exercise Prescription Changes: Exercise Prescription Changes  Row Name 09/30/17 1500 10/15/17 1200 10/30/17 1400 11/06/17 1100 11/13/17 1500     Response to Exercise   Blood Pressure (Admit)  - 80 dopplar  - 84  - 88  - 88  - 90   Blood Pressure (Exercise)  - 112 dopplar  -  -  -  -   Blood Pressure (Exit)  - 114 dopplar, 106 dopplar recheck  - 84  - 82  - 82  - 72   Heart Rate (Admit)  78 bpm  84 bpm  96 bpm  96 bpm  80 bpm   Heart Rate (Exercise)  121 bpm  101 bpm  99 bpm  99 bpm  118 bpm   Heart Rate (Exit)  90 bpm  -  88 bpm  88 bpm  87 bpm   Oxygen Saturation (Admit)  96 %  89 %  98 %  98 %  96 %   Oxygen Saturation (Exercise)  83 %  97 %  96 %  96 %  96 %   Oxygen Saturation (Exit)  90 %  97 %  96 %  96 %  -   Rating of Perceived Exertion (Exercise)  _0 Perceived Dyspnea (Exercise)  _1 Symptoms  back pain 8/10, SOB  -  -  -  -   Comments  walk test results  -  -  -  -   Duration  -  Progress to 45 minutes of aerobic  exercise without signs/symptoms of physical distress  Progress to 45 minutes of aerobic exercise without signs/symptoms of physical distress  Progress to 45 minutes of aerobic exercise without signs/symptoms of physical distress  Progress to 45 minutes of aerobic exercise without signs/symptoms of physical distress   Intensity  -  THRR unchanged  THRR unchanged  THRR unchanged  THRR unchanged     Progression   Progression  -  Continue to progress workloads to maintain intensity without signs/symptoms of physical distress.  Continue to progress workloads to maintain intensity without signs/symptoms of physical distress.  Continue to progress workloads to maintain intensity without signs/symptoms of physical distress.  Continue to progress workloads to maintain intensity without signs/symptoms of physical distress.     Resistance Training   Training Prescription  -  Yes  Yes  Yes  Yes   Weight  -  3 lb  3 lb  3 lb  3 lb   Reps  -  10-15  10-15  10-15  10-15     Oxygen   Oxygen  -  Continuous  Continuous  Continuous  Continuous   Liters  -  _2 Treadmill   MPH  -  -  1.2  1.2  1.3   Grade  -  -  0  0  0   Minutes  -  -  _3 METs  -  -  _4 NuStep   Level  -  1  -  -  3   SPM  -  80  -  -  80   Minutes  -  15  -  -  15   METs  -  -  -  -  1.6     REL-XR  Level  -  -  _0 Speed  -  -  50  50  50   Minutes  -  -  _1 METs  -  -  -  -  1.7     Home Exercise Plan   Plans to continue exercise at  -  -  -  Home (comment) has 3 and 5 lb weights  Home (comment) has 3 and 5 lb weights   Frequency  -  -  -  Add 1 additional day to program exercise sessions.  Add 1 additional day to program exercise sessions.   Initial Home Exercises Provided  -  -  -  11/06/17  11/06/17      Exercise Comments: Exercise Comments    Row Name 10/04/17 1047           Exercise Comments   First full day of exercise!  Patient was oriented to gym and equipment  including functions, settings, policies, and procedures.  Patient's individual exercise prescription and treatment plan were reviewed.  All starting workloads were established based on the results of the 6 minute walk test done at initial orientation visit.  The plan for exercise progression was also introduced and progression will be customized based on patient's performance and goals          Exercise Goals and Review: Exercise Goals    Row Name 09/30/17 1600             Exercise Goals   Increase Physical Activity  Yes       Intervention  Provide advice, education, support and counseling about physical activity/exercise needs.;Develop an individualized exercise prescription for aerobic and resistive training based on initial evaluation findings, risk stratification, comorbidities and participant's personal goals.       Expected Outcomes  Short Term: Attend rehab on a regular basis to increase amount of physical activity.;Long Term: Add in home exercise to make exercise part of routine and to increase amount of physical activity.;Long Term: Exercising regularly at least 3-5 days a week.       Increase Strength and Stamina  Yes       Intervention  Provide advice, education, support and counseling about physical activity/exercise needs.;Develop an individualized exercise prescription for aerobic and resistive training based on initial evaluation findings, risk stratification, comorbidities and participant's personal goals.       Expected Outcomes  Short Term: Increase workloads from initial exercise prescription for resistance, speed, and METs.;Short Term: Perform resistance training exercises routinely during rehab and add in resistance training at home;Long Term: Improve cardiorespiratory fitness, muscular endurance and strength as measured by increased METs and functional capacity (6MWT)       Able to understand and use rate of perceived exertion (RPE) scale  Yes       Intervention  Provide  education and explanation on how to use RPE scale       Expected Outcomes  Long Term:  Able to use RPE to guide intensity level when exercising independently;Short Term: Able to use RPE daily in rehab to express subjective intensity level       Able to understand and use Dyspnea scale  Yes       Intervention  Provide education and explanation on how to use Dyspnea scale       Expected Outcomes  Long Term: Able to use Dyspnea scale to guide intensity level when exercising independently;Short Term:  Able to use Dyspnea scale daily in rehab to express subjective sense of shortness of breath during exertion       Knowledge and understanding of Target Heart Rate Range (THRR)  Yes       Intervention  Provide education and explanation of THRR including how the numbers were predicted and where they are located for reference       Expected Outcomes  Short Term: Able to state/look up THRR;Short Term: Able to use daily as guideline for intensity in rehab;Long Term: Able to use THRR to govern intensity when exercising independently       Able to check pulse independently  Yes       Intervention  Provide education and demonstration on how to check pulse in carotid and radial arteries.;Review the importance of being able to check your own pulse for safety during independent exercise       Expected Outcomes  Short Term: Able to explain why pulse checking is important during independent exercise;Long Term: Able to check pulse independently and accurately       Understanding of Exercise Prescription  Yes       Intervention  Provide education, explanation, and written materials on patient's individual exercise prescription       Expected Outcomes  Short Term: Able to explain program exercise prescription;Long Term: Able to explain home exercise prescription to exercise independently          Exercise Goals Re-Evaluation : Exercise Goals Re-Evaluation    William Bray Name 10/04/17 1048 10/15/17 1252 10/30/17 1449 11/06/17  1150       Exercise Goal Re-Evaluation   Exercise Goals Review  Increase Physical Activity;Increase Strength and Stamina;Able to understand and use rate of perceived exertion (RPE) scale;Able to understand and use Dyspnea scale;Knowledge and understanding of Target Heart Rate Range (THRR);Understanding of Exercise Prescription  Increase Physical Activity;Able to understand and use rate of perceived exertion (RPE) scale;Increase Strength and Stamina;Able to understand and use Dyspnea scale  Increase Physical Activity;Increase Strength and Stamina;Able to understand and use rate of perceived exertion (RPE) scale;Able to understand and use Dyspnea scale  Increase Physical Activity;Increase Strength and Stamina;Able to understand and use Dyspnea scale;Able to understand and use rate of perceived exertion (RPE) scale;Knowledge and understanding of Target Heart Rate Range (THRR);Able to check pulse independently    Comments  Reviewed RPE scale, THR and program prescription with pt today.  Pt voiced understanding and was given a copy of goals to take home.   William Bray is just starting back to exercise.  He is tolerating exercise well and BG has been within acceptable range.    William Bray does the best he can day to day with exercise.  He has missed some sessions due to back pain.  Staff will monitor progress.  Reviewed home exercise - William Bray has had some back pain so we reviewed stretches as well as the strength work to do seated.  Pt voiced understanding.  he has been doing weights at home already.    Expected Outcomes  Short: Use RPE daily to regulate intensity.  Long: Follow program prescription in THR.  Short - pt will attend class 3 days per week Long - Pt will improve MET level   Short - [place]len will be able to attend regularly Long - Suresh will maintain fitness level  Short - Vidit will do strength work on days not at [employer] will maintain exercise on his own       Discharge Exercise  Prescription (Final Exercise  Prescription Changes): Exercise Prescription Changes - 11/13/17 1500      Response to Exercise   Blood Pressure (Admit)  -- 90    Blood Pressure (Exit)  -- 72    Heart Rate (Admit)  80 bpm    Heart Rate (Exercise)  118 bpm    Heart Rate (Exit)  87 bpm    Oxygen Saturation (Admit)  96 %    Oxygen Saturation (Exercise)  96 %    Rating of Perceived Exertion (Exercise)  14    Perceived Dyspnea (Exercise)  3    Duration  Progress to 45 minutes of aerobic exercise without signs/symptoms of physical distress    Intensity  THRR unchanged      Progression   Progression  Continue to progress workloads to maintain intensity without signs/symptoms of physical distress.      Resistance Training   Training Prescription  Yes    Weight  3 lb    Reps  10-15      Oxygen   Oxygen  Continuous    Liters  3      Treadmill   MPH  1.3    Grade  0    Minutes  15    METs  2      NuStep   Level  3    SPM  80    Minutes  15    METs  1.6      REL-XR   Level  3    Speed  50    Minutes  15    METs  1.7      Home Exercise Plan   Plans to continue exercise at  Home (comment) has 3 and 5 lb weights    Frequency  Add 1 additional day to program exercise sessions.    Initial Home Exercises Provided  11/06/17       Nutrition:  Target Goals: Understanding of nutrition guidelines, daily intake of sodium <1559m, cholesterol <2070m calories 30% from fat and 7% or less from saturated fats, daily to have 5 or more servings of fruits and vegetables.  Biometrics: Pre Biometrics - 09/30/17 1601      Pre Biometrics   Height  5' 8.7" (1.745 m)    Weight  181 lb 4.8 oz (82.2 kg)    Waist Circumference  39 inches    Hip Circumference  38.5 inches    Waist to Hip Ratio  1.01 %    BMI (Calculated)  27.01    Single Leg Stand  5.15 seconds        Nutrition Therapy Plan and Nutrition Goals: Nutrition Therapy & Goals - 10/16/17 1021      Nutrition Therapy   Diet  DM / TLC    Drug/Food  Interactions  Coumadin/Vit K    Protein (specify units)  12oz    Fiber  30 grams    Whole Grain Foods  3 servings    Saturated Fats  15 max. grams    Fruits and Vegetables  5 servings/day 8 ideal    Sodium  2000 grams      Personal Nutrition Goals   Nutrition Goal  Eat on a consistent schedule daily in order to stimulate appetite / hunger    Personal Goal #2  If you continue to find yourself without an appetite, consuming a diet higher in fat can help you to consume more calories with a smaller volume of food    Comments  He would like to maintain his CBW but does not always feel hungry. He has had dietary education off and on since [place], educate and counsel regarding individualized specific dietary modifications aiming towards targeted core components such as weight, hypertension, lipid management, diabetes, heart failure and other comorbidities.    Expected Outcomes  Short Term Goal: Understand basic principles of dietary content, such as calories, fat, sodium, cholesterol and nutrients.;Short Term Goal: A plan has been developed with personal nutrition goals set during dietitian appointment.;Long Term Goal: Adherence to prescribed nutrition plan.       Nutrition Assessments: Nutrition Assessments - 09/30/17 1427      MEDFICTS Scores   Pre Score  30       Nutrition Goals Re-Evaluation: Nutrition Goals Re-Evaluation    Row Name 10/16/17 1023 10/16/17 1024 11/06/17 1035         Goals   Current Weight  -  -  189 lb (85.7 kg)     Nutrition Goal  If you continue to find yourself without an appetite, consuming a diet higher in fat can help you to consume more calories with a smaller volume of food  -  William Bray wants to weigh 180 pounds without his LVAD. Do not over eat.     Comment  He c/o decreased appetite  -  William Bray states he is eating too much food. He met with the dietician but states he eats too many vegatables. One doctor tells him  that he needs to lose weight and one that says he needs to gain weight.     Expected Outcome  He will eat foods high in fat around the same times each day in order to maximize his caloric intake  -  Short: Eat healthy food and not over eat. Long: Obtain a healthy weight.       Personal Goal #2 Re-Evaluation   Personal Goal #2  -  Eat on a consistent schedule daily in order to stimulate hunger/ appetite  -        Nutrition Goals Discharge (Final Nutrition Goals Re-Evaluation): Nutrition Goals Re-Evaluation - 11/06/17 1035      Goals   Current Weight  189 lb (85.7 kg)    Nutrition Goal  William Bray wants to weigh 180 pounds without his LVAD. Do not over eat.    Comment  Rexton states he is eating too much food. He met with the dietician but states he eats too many vegatables. One doctor tells him that he needs to lose weight and one that says he needs to gain weight.    Expected Outcome  Short: Eat healthy food and not over eat. Long: Obtain a healthy weight.       Psychosocial: Target Goals: Acknowledge presence or absence of significant depression and/or stress, maximize coping skills, provide positive support system. Participant is able to verbalize types and ability to use techniques and skills needed for reducing stress and depression.   Initial Review & Psychosocial Screening: Initial Psych Review & Screening - 09/30/17 1425      Initial Review   Current issues with  History of Depression;Current Psychotropic Meds;Current Stress Concerns    Source of Stress Concerns  Chronic Illness;Unable to perform yard/household activities    Comments  He feels like his breathing is getting worse and wants to get better      [place]?  Yes  Comments  William Bray has great support from his wife, daughter, and son.       Barriers   Psychosocial barriers to participate in program  The patient should benefit from training in stress management and relaxation.      Screening  Interventions   Interventions  Encouraged to exercise;Provide feedback about the scores to participant;Program counselor consult;To provide support and resources with identified psychosocial needs    Expected Outcomes  Short Term goal: Utilizing psychosocial counselor, staff and physician to assist with identification of specific Stressors or current issues interfering with healing process. Setting desired goal for each stressor or current issue identified.;Long Term Goal: Stressors or current issues are controlled or eliminated.;Short Term goal: Identification and review with participant of any Quality of Life or Depression concerns found by scoring the questionnaire.;Long Term goal: The participant improves quality of Life and PHQ9 Scores as seen by post scores and/or verbalization of changes       Quality of Life Scores:  Scores of 19 and below usually indicate a poorer quality of life in these areas.  A difference of  2-3 points is a clinically meaningful difference.  A difference of 2-3 points in the total score of the Quality of Life Index has been associated with significant improvement in overall quality of life, self-image, physical symptoms, and general health in studies assessing change in quality of life.  PHQ-9: Recent Review Flowsheet Data    Depression screen [facility] 2/9 11/20/2017 10/22/2017 09/30/2017 08/28/2016 01/02/2016   Decreased Interest 0 _0 0   Down, Depressed, Hopeless 0 _1 PHQ - 2 Score 0 _2 Altered sleeping - _3 Tired, decreased energy - _4 Change in appetite - _5 0   Feeling bad or failure about yourself  - _6 Trouble concentrating - _7 Moving slowly or fidgety/restless - _8 Suicidal thoughts - 0 0 0 0   PHQ-9 Score - _9 Difficult doing work/chores - Somewhat difficult Very difficult Not difficult at all Somewhat difficult     Interpretation of Total Score  Total Score Depression Severity:  1-4 = Minimal  depression, 5-9 = Mild depression, 10-14 = Moderate depression, 15-19 = Moderately severe depression, 20-27 = Severe depression   Psychosocial Evaluation and Intervention: Psychosocial Evaluation - 10/16/17 1023      Psychosocial Evaluation & Interventions   Comments  William Bray has returned to Pulmonary Rehab after being in the hospital for several months late December/early January.  He is a 67 year old who has multiple health issues with CHF and COPD primarily along with severe back problems.  William Bray has a strong support system with a spouse; sisters and a sister-in-law; a son and a daughter locally.  He reports not sleeping great with maybe 5-6 hours of sleep per night.  His appetite is "fine" currently.  Arshdeep reports a history of anxiety with some symptoms present several times each day.  He is on medication for this that helps somewhat.  William Bray reports being in a positive mood most of the time and his health is his primary stressor.  He has goals to get stronger and breathe better overall.  Staff will follow with William Bray     Expected Outcomes  Short:  Stevan will exercise to reduce his anxiety  symptoms.   Long:  Macguire will exercise consistently to recover his stamina and strength and breathe better overall.      Continue Psychosocial Services   Follow up required by staff       Psychosocial Re-Evaluation: Psychosocial Re-Evaluation    William Bray Name 10/14/17 1341 11/06/17 1046           Psychosocial Re-Evaluation   Current issues with  -  Current Sleep Concerns;Current Stress Concerns;Current Depression;History of Depression      Comments  William Bray has just joined the prgram after a year put. He had been in the Maintenance classes and his MD wanted him in Pulmonary Rehab. He is struggling with increased SOB today, his weight is up 9 pounds over a week.  He called his LVAD team and reviewed this info with them. He will keep them informed of how his weight changes.  William Bray recently was placed on Prednisone, and the  staff did explain how it can make him retain fluid, it was emphasized that he should be weiging himself daily and reporting 2 0r more weight gain to the LVAD team. He is upset that he is more SOB today and wants to feel better.  He was told to gain some weight and thought the weight gain was form eating.  Will continue to followup with William Bray and encourage him to closely watch his weight., as the more contol of keeping the excess fluid will help with the SOB and his ability to keep up the exercise, which wil help his stamina increase and overall feeling better-whichis his ultimate goal.    William Bray cant do what he wants to do, he does not like being handicapped. Ever since his health went down hill he has been more depressed. He states he is depressed about everything.       Expected Outcomes  -  Short: Attened [name] regularly to decrease stress. Long: graduate [name] to keep stress at a minimum.      Interventions  Therapist referral;Encouraged to attend Pulmonary Rehabilitation for the exercise;Relaxation education  Therapist referral;Encouraged to attend Pulmonary Rehabilitation for the exercise;Relaxation education      Continue Psychosocial Services   Follow up required by counselor  Follow up required by staff         Psychosocial Discharge (Final Psychosocial Re-Evaluation): Psychosocial Re-Evaluation - 11/06/17 1046      Psychosocial Re-Evaluation   Current issues with  Current Sleep Concerns;Current Stress Concerns;Current Depression;History of Depression    Comments  William Bray cant do what he wants to do, he does not like being handicapped. Ever since his health went down hill he has been more depressed. He states he is depressed about everything.     Expected Outcomes  Short: Attened [name] regularly to decrease stress. Long: graduate [name] to keep stress at a minimum.    Interventions  Therapist referral;Encouraged to attend Pulmonary Rehabilitation for the exercise;Relaxation education     Continue Psychosocial Services   Follow up required by staff       Education: Education Goals: Education classes will be provided on a weekly basis, covering required topics. Participant will state understanding/return demonstration of topics presented.  Learning Barriers/Preferences: Learning Barriers/Preferences - 09/30/17 1436      Learning Barriers/Preferences   Learning Barriers  None    Learning Preferences  None       Education Topics:  Initial Evaluation Education: - Verbal, written and demonstration of respiratory meds, oximetry and breathing techniques. Instruction on use of nebulizers and  MDIs and importance of monitoring MDI activations.   Pulmonary Rehab from 10/23/2017 in [facility] Cardiac and Pulmonary Rehab  Date  09/30/17  Educator  [facility]  Instruction Review Code  1- Verbalizes Understanding      General Nutrition Guidelines/Fats and Fiber: -Group instruction provided by verbal, written material, models and posters to present the general guidelines for heart healthy nutrition. Gives an explanation and review of dietary fats and fiber.   Pulmonary Rehab from 10/23/2017 in [facility] Cardiac and Pulmonary Rehab  Date  10/14/17  Educator  CR  Instruction Review Code  1- Verbalizes Understanding      Controlling Sodium/Reading Food Labels: -Group verbal and written material supporting the discussion of sodium use in heart healthy nutrition. Review and explanation with models, verbal and written materials for utilization of the food label.   Exercise Physiology & General Exercise Guidelines: - Group verbal and written instruction with models to review the exercise physiology of the cardiovascular system and associated critical values. Provides general exercise guidelines with specific guidelines to those with heart or lung disease.    Aerobic Exercise & Resistance Training: - Gives group verbal and written instruction on the various components of exercise. Focuses on  aerobic and resistive training programs and the benefits of this training and how to safely progress through these programs.   Cardiac Rehab from 03/21/2016 in [facility] Cardiac and Pulmonary Rehab  Date  02/29/16  Educator  [facility]  Instruction Review Code (retired)  2- Statistician, Balance, Mind/Body Relaxation: Provides group verbal/written instruction on the benefits of flexibility and balance training, including mind/body exercise modes such as yoga, pilates and tai chi.  Demonstration and skill practice provided.   Cardiac Rehab from 03/21/2016 in [facility] Cardiac and Pulmonary Rehab  Date  01/09/16  Educator  [facility]  Instruction Review Code (retired)  2- meets goals/outcomes      Stress and Anxiety: - Provides group verbal and written instruction about the health risks of elevated stress and causes of high stress.  Discuss the correlation between heart/lung disease and anxiety and treatment options. Review healthy ways to manage with stress and anxiety.   Pulmonary Rehab from 10/23/2017 in [facility] Cardiac and Pulmonary Rehab  Date  10/23/17  Educator  [facility]  Instruction Review Code  1- Verbalizes Understanding      Depression: - Provides group verbal and written instruction on the correlation between heart/lung disease and depressed mood, treatment options, and the stigmas associated with seeking treatment.   Exercise & Equipment Safety: - Individual verbal instruction and demonstration of equipment use and safety with use of the equipment.   Pulmonary Rehab from 10/23/2017 in [facility] Cardiac and Pulmonary Rehab  Date  09/30/17  Educator  [facility]  Instruction Review Code  1- Verbalizes Understanding      Infection Prevention: - Provides verbal and written material to individual with discussion of infection control including proper hand washing and proper equipment cleaning during exercise session.   Pulmonary Rehab from 10/23/2017 in [facility] Cardiac and Pulmonary Rehab  Date   09/30/17  Educator  [facility]  Instruction Review Code  1- Verbalizes Understanding      Falls Prevention: - Provides verbal and written material to individual with discussion of falls prevention and safety.   Pulmonary Rehab from 10/23/2017 in [facility] Cardiac and Pulmonary Rehab  Date  09/30/17  Educator  [facility]  Instruction Review Code  1- Verbalizes Understanding      Diabetes: - Individual verbal and  written instruction to review signs/symptoms of diabetes, desired ranges of glucose level fasting, after meals and with exercise. Advice that pre and post exercise glucose checks will be done for 3 sessions at entry of program.   Cardiac Rehab from 03/21/2016 in [facility] Cardiac and Pulmonary Rehab  Date  01/02/16  Educator  D. Joya Gaskins, RN  Instruction Review Code (retired)  2- meets goals/outcomes      Chronic Lung Diseases: - Group verbal and written instruction to review updates, respiratory medications, advancements in procedures and treatments. Discuss use of supplemental oxygen including available portable oxygen systems, continuous and intermittent flow rates, concentrators, personal use and safety guidelines. Review proper use of inhaler and spacers. Provide informative websites for self-education.    Energy Conservation: - Provide group verbal and written instruction for methods to conserve energy, plan and organize activities. Instruct on pacing techniques, use of adaptive equipment and posture/positioning to relieve shortness of breath.   Pulmonary Rehab from 10/23/2017 in [facility] Cardiac and Pulmonary Rehab  Date  10/16/17  Educator  [facility]  Instruction Review Code  1- Verbalizes Understanding      Triggers and Exacerbations: - Group verbal and written instruction to review types of environmental triggers and ways to prevent exacerbations. Discuss weather changes, air quality and the benefits of nasal washing. Review warning signs and symptoms to help prevent infections. Discuss techniques for  effective airway clearance, coughing, and vibrations.   AED/CPR: - Group verbal and written instruction with the use of models to demonstrate the basic use of the AED with the basic ABC's of resuscitation.   Pulmonary Rehab from 10/23/2017 in [facility] Cardiac and Pulmonary Rehab  Date  10/18/17  Educator  [place]  Instruction Review Code  1- Verbalizes Understanding      Anatomy and Physiology of the Lungs: - Group verbal and written instruction with the use of models to provide basic lung anatomy and physiology related to function, structure and complications of lung disease.   Anatomy & Physiology of the Heart: - Group verbal and written instruction and models provide basic cardiac anatomy and physiology, with the coronary electrical and arterial systems. Review of Valvular disease and Heart Failure   Cardiac Rehab from 03/21/2016 in [facility] Cardiac and Pulmonary Rehab  Date  01/16/16  Educator  [facility]  Instruction Review Code (retired)  2- meets goals/outcomes      Cardiac Medications: - Group verbal and written instruction to review commonly prescribed medications for heart disease. Reviews the medication, class of the drug, and side effects.   Pulmonary Rehab from 10/23/2017 in [facility] Cardiac and Pulmonary Rehab  Date  10/04/17  Educator  [facility]  Instruction Review Code  1- Verbalizes Understanding      Know Your Numbers and Risk Factors: -Group verbal and written instruction about important numbers in your health.  Discussion of what are risk factors and how they play a role in the disease process.  Review of Cholesterol, Blood Pressure, Diabetes, and BMI and the role they play in your overall health.   Sleep Hygiene: -Provides group verbal and written instruction about how sleep can affect your health.  Define sleep hygiene, discuss sleep cycles and impact of sleep habits. Review good sleep hygiene tips.    Other: -Provides group and verbal instruction on various topics (see  comments)    Knowledge Questionnaire Score: Knowledge Questionnaire Score - 09/30/17 1436      Knowledge Questionnaire Score   Pre Score  15/18 reviewed with patient  Core Components/Risk Factors/Patient Goals at Admission: Personal Goals and Risk Factors at Admission - 09/30/17 1438      Core Components/Risk Factors/Patient Goals on Admission    Weight Management  Yes;Weight Maintenance;Weight Loss    Intervention  Weight Management: Develop a combined nutrition and exercise program designed to reach desired caloric intake, while maintaining appropriate intake of nutrient and fiber, sodium and fats, and appropriate energy expenditure required for the weight goal.;Weight Management: Provide education and appropriate resources to help participant work on and attain dietary goals.;Weight Management/Obesity: Establish reasonable short term and long term weight goals.    Admit Weight  181 lb 4.8 oz (82.2 kg)    Goal Weight: Short Term  170 lb (77.1 kg)    Goal Weight: Long Term  170 lb (77.1 kg)    Expected Outcomes  Short Term: Continue to assess and modify interventions until short term weight is achieved;Long Term: Adherence to nutrition and physical activity/exercise program aimed toward attainment of established weight goal;Weight Maintenance: Understanding of the daily nutrition guidelines, which includes 25-35% calories from fat, 7% or less cal from saturated fats, less than 226m cholesterol, less than 1.5gm of sodium, & 5 or more servings of fruits and vegetables daily;Weight Loss: Understanding of general recommendations for a balanced deficit meal plan, which promotes 1-2 lb weight loss per week and includes a negative energy balance of [phone] kcal/d;Understanding recommendations for meals to include 15-35% energy as protein, 25-35% energy from fat, 35-60% energy from carbohydrates, less than 2055mof dietary cholesterol, 20-35 gm of total fiber daily;Understanding of distribution  of calorie intake throughout the day with the consumption of 4-5 meals/snacks    Improve shortness of breath with ADL's  Yes    Intervention  Provide education, individualized exercise plan and daily activity instruction to help decrease symptoms of SOB with activities of daily living.    Expected Outcomes  Short Term: Improve cardiorespiratory fitness to achieve a reduction of symptoms when performing ADLs;Long Term: Be able to perform more ADLs without symptoms or delay the onset of symptoms    Diabetes  Yes    Intervention  Provide education about signs/symptoms and action to take for hypo/hyperglycemia.;Provide education about proper nutrition, including hydration, and aerobic/resistive exercise prescription along with prescribed medications to achieve blood glucose in normal ranges: Fasting glucose 65-99 mg/dL    Expected Outcomes  Short Term: Participant verbalizes understanding of the signs/symptoms and immediate care of hyper/hypoglycemia, proper foot care and importance of medication, aerobic/resistive exercise and nutrition plan for blood glucose control.;Long Term: Attainment of HbA1C < 7%.    Heart Failure  Yes    Intervention  Provide a combined exercise and nutrition program that is supplemented with education, support and counseling about heart failure. Directed toward relieving symptoms such as shortness of breath, decreased exercise tolerance, and extremity edema.    Expected Outcomes  Improve functional capacity of life;Short term: Attendance in program 2-3 days a week with increased exercise capacity. Reported lower sodium intake. Reported increased fruit and vegetable intake. Reports medication compliance.;Short term: Daily weights obtained and reported for increase. Utilizing diuretic protocols set by physician.;Long term: Adoption of self-care skills and reduction of barriers for early signs and symptoms recognition and intervention leading to self-care maintenance.    Hypertension   Yes    Intervention  Provide education on lifestyle modifcations including regular physical activity/exercise, weight management, moderate sodium restriction and increased consumption of fresh fruit, vegetables, and low fat dairy, alcohol moderation, and smoking cessation.;Monitor prescription  use compliance.    Expected Outcomes  Short Term: Continued assessment and intervention until BP is < 140/43m HG in hypertensive participants. < 130/880mHG in hypertensive participants with diabetes, heart failure or chronic kidney disease.;Long Term: Maintenance of blood pressure at goal levels.       Core Components/Risk Factors/Patient Goals Review:  Goals and Risk Factor Review    Row Name 10/14/17 1350 11/06/17 1020           Core Components/Risk Factors/Patient Goals Review   Personal Goals Review  Weight Management/Obesity;Heart Failure;Improve shortness of breath with ADL's;Diabetes;Hypertension  Weight Management/Obesity;Heart Failure;Improve shortness of breath with ADL's;Diabetes;Hypertension      Review  PAWeymanas recently started the program. Today he was presenting with incresed SOB and weight gain.  After a call to his LVAD team , PAUdayas a game plan with his diurectics to work on the fluid overload. He is taking all his meds and checking his blood sugar levels at home.   His AM fasting BS is 140-200 per PaEddie Bray HE will report this to his MD.    PaStanislawas been checking his blood sugar at home and has been in the 140s-150-s fasting. His weight goal is to be 180 pounds. He is able to take a shower by himself, dress himself and do house work without any issues. His blood pressure has been 70s-80s and stable. 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 His back and his artheritis have been giving him issues. He is trying to get into the pain clinic and had his first appointment last week or so.      Expected Outcomes  ST: PaJhalilill improve the SOB symptoms as he gains control of excess fluid. He will speak to  his MD about his blood Glucose elvels and he will continue to attend the program to work on buliding his stamina and strength. LT PaFerminill be able to know when to call his MD about his weight gain and will continue to keep other risk factors under control.   Short: work with the pain clinis to manage his back pain. Long: obtain treatment to help with his back and artheritis.         Core Components/Risk Factors/Patient Goals at Discharge (Final Review):  Goals and Risk Factor Review - 11/06/17 1020      Core Components/Risk Factors/Patient Goals Review   Personal Goals Review  Weight Management/Obesity;Heart Failure;Improve shortness of breath with ADL's;Diabetes;Hypertension    Review  PaBradleeas been checking his blood sugar at home and has been in the 140s-150-s fasting. His weight goal is to be 180 pounds. He is able to take a shower by himself, dress himself and do house work without any issues. His blood pressure has been 70s-80s and stable. 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 His back and his artheritis have been giving him issues. He is trying to get into the pain clinic and had his first appointment last week or so.    Expected Outcomes  Short: work with the pain clinis to manage his back pain. Long: obtain treatment to help with his back and artheritis.       ITP Comments: ITP Comments    Row Name 09/30/17 1400 10/14/17 1122 10/14/17 1339 10/18/17 1147 10/28/17 0812   ITP Comments  Medical Evaluation completed. Chart sent for review and changes to Dr. MaEmily Filbertirector of [facility]Diagnosis can be found in [facility]  Patient arrived and had gained  9 lbs in 7 days and was short of breath. We was advised by staff not to exercise and to call his LVAD team at [facility]. He called his his team and they cleared him to exercise over the phone. He did exercise at a very light intensity and tolerated it well.   Patient arrived and had gained 9 lbs in 7 days and was short of breath. We was  advised by staff not to exercise and to call his LVAD team at [facility]. He called his his team and they cleared him to exercise over the phone. He did exercise at a very light intensity and tolerated it well.    Pt's BP was 64. Patient reported lightheadedness and weakness. He reported that he usually takes lisinopril 64m daily at night. He recently refilled his lisinopril and realized that the dose was 225minstead of 1077mHe checked his medication list and noticed it is 51m1m he took 51mg79mt night but he is not sure when the dose was changed to 51mg.76mwas asked to call his PCP to verify the right dose. PCP verbalized that dose is 10mg n13m0mg. [id number] 2 glasses of water. Will continue to monitor patient's status.    30 day review completed. ITP sent to Dr. Mark MilEmily Filbertr of [facility] with ITP unless changes are made by physician   [place]/01/19 0941           ITP Comments  30 day review completed. ITP sent to Dr. Mark MilEmily Filbertr of [facility] with ITP unless changes are made by physician          Comments: 30 day review

## 2017-11-27 ENCOUNTER — Telehealth: Payer: Self-pay

## 2017-11-27 NOTE — Telephone Encounter (Signed)
Called William Bray to see how he is doing and why he has been out of [place]. Left message for patient.

## 2017-12-04 ENCOUNTER — Ambulatory Visit
Admission: RE | Admit: 2017-12-04 | Discharge: 2017-12-04 | Disposition: A | Discharge status: Home / Self Care | Payer: Medicare Other | Source: Ambulatory Visit | Attending: Pain Medicine | Admitting: Pain Medicine

## 2017-12-04 DIAGNOSIS — M48062 Spinal stenosis, lumbar region with neurogenic claudication: Secondary | ICD-10-CM | POA: Insufficient documentation

## 2017-12-04 DIAGNOSIS — M5136 Other intervertebral disc degeneration, lumbar region: Secondary | ICD-10-CM

## 2017-12-04 DIAGNOSIS — M5116 Intervertebral disc disorders with radiculopathy, lumbar region: Secondary | ICD-10-CM | POA: Diagnosis not present

## 2017-12-04 DIAGNOSIS — M5117 Intervertebral disc disorders with radiculopathy, lumbosacral region: Secondary | ICD-10-CM | POA: Insufficient documentation

## 2017-12-04 DIAGNOSIS — G8929 Other chronic pain: Secondary | ICD-10-CM | POA: Insufficient documentation

## 2017-12-04 DIAGNOSIS — M4722 Other spondylosis with radiculopathy, cervical region: Secondary | ICD-10-CM | POA: Diagnosis not present

## 2017-12-04 DIAGNOSIS — M5010 Cervical disc disorder with radiculopathy, unspecified cervical region: Secondary | ICD-10-CM | POA: Diagnosis not present

## 2017-12-04 DIAGNOSIS — M5416 Radiculopathy, lumbar region: Secondary | ICD-10-CM

## 2017-12-04 DIAGNOSIS — M5030 Other cervical disc degeneration, unspecified cervical region: Secondary | ICD-10-CM

## 2017-12-04 DIAGNOSIS — M5126 Other intervertebral disc displacement, lumbar region: Secondary | ICD-10-CM

## 2017-12-04 DIAGNOSIS — M545 Low back pain: Secondary | ICD-10-CM

## 2017-12-04 DIAGNOSIS — M4328 Fusion of spine, sacral and sacrococcygeal region: Secondary | ICD-10-CM | POA: Diagnosis not present

## 2017-12-04 DIAGNOSIS — I6521 Occlusion and stenosis of right carotid artery: Secondary | ICD-10-CM | POA: Diagnosis not present

## 2017-12-04 DIAGNOSIS — M25512 Pain in left shoulder: Secondary | ICD-10-CM | POA: Diagnosis not present

## 2017-12-04 DIAGNOSIS — I700 Atherosclerosis of aorta: Secondary | ICD-10-CM | POA: Diagnosis not present

## 2017-12-04 DIAGNOSIS — M5412 Radiculopathy, cervical region: Secondary | ICD-10-CM

## 2017-12-04 DIAGNOSIS — M4802 Spinal stenosis, cervical region: Secondary | ICD-10-CM | POA: Insufficient documentation

## 2017-12-04 DIAGNOSIS — M25511 Pain in right shoulder: Secondary | ICD-10-CM

## 2017-12-04 DIAGNOSIS — M542 Cervicalgia: Secondary | ICD-10-CM

## 2017-12-10 NOTE — Progress Notes (Signed)
Patient's Name: William Bray  MRN: [id number]  Referring Provider: Juluis Pitch, MD  DOB: Nov 23, 1950  PCP: Juluis Pitch, MD  DOS: 12/11/2017  Note by: Gaspar Cola, MD  Service setting: Ambulatory outpatient  Specialty: Interventional Pain Management  Location: [facility] (AMB) Pain Management Facility    Patient type: Established   Primary Reason(s) for Visit: Evaluation of chronic illnesses with exacerbation, or progression (Level of risk: moderate) CC: Back Pain (lower/mid)  HPI  [name] is a 67 y.o. year old, male patient, who comes today for a follow-up evaluation. He has Acute on chronic systolic heart failure ([place]); Anxiety; Automatic implantable cardioverter-defibrillator in situ; CAD (coronary artery disease); Chronic anticoagulation (Coumadin); Chronic systolic CHF (congestive heart failure) ([place]); Pulmonary emphysema (HCC); DDD (degenerative disc disease), lumbar; Depression; Presence of drug coated stent in left circumflex coronary artery; Encounter for fitting or adjustment of automatic implantable cardioverter-defibrillator; Essential hypertension; Epistaxis; H/O ventricular fibrillation; HOH (hard of hearing); Hypertriglyceridemia; Iron deficiency anemia due to chronic blood loss; Ischemic cardiomyopathy; Lumbar radiculitis; Lumbar stenosis with neurogenic claudication; LVAD (left ventricular assist device) present ([place]); 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; NSVT (nonsustained ventricular tachycardia) ([place]); OSA treated with BiPAP; Paroxysmal atrial fibrillation ([place]); Patient in clinical research study; PVC's (premature ventricular contractions); Steatohepatitis; Valvular regurgitation; Acute on chronic respiratory failure with hypercapnia ([place]); Chronic gouty arthritis; Aortic insufficiency; Complication involving left ventricular assist device (LVAD); Constipation; History of GI bleed; History of thrombocytopenia; Influenza A; Leukocytosis; On amiodarone therapy; Thyroid  mass of unclear etiology; Type 2 diabetes mellitus without complication, without long-term current use of insulin ([place]); Hyperlipidemia; Chronic low back pain (Primary Area of Pain) (Bilateral); Chronic neck pain (Secondary Area of Pain) (Bilateral) (L>R); Chronic shoulder pain (Tertiary Area of Pain) (Bilateral) (L>R); Chronic pain syndrome; Long term current use of opiate analgesic; Disorder of skeletal system; Problems influencing health status; Pharmacologic therapy; Elevated C-reactive protein (CRP); Elevated sed rate; Neurogenic pain; Positive ANA (antinuclear antibody); Muscle weakness of lower extremity (Bilateral) (L>R); Chronic sacroiliac joint pain (Left); Lumbar facet joint syndrome (Bilateral) (L>R); Lumbar discogenic pain syndrome; DDD (degenerative disc disease), cervical; Radicular pain of shoulder (Bilateral); Cervical radiculitis; History of claustrophobia; Generalized anxiety disorder; Other intervertebral disc degeneration, lumbar region; Cervicalgia; Other cervical disc degeneration, unspecified cervical region; Spondylosis without myelopathy or radiculopathy, lumbosacral region; Long term prescription benzodiazepine use; Cervical Facet Arthropathy (Bilateral); Cervical facet syndrome; Cervical Foraminal Stenosis (C3-4) (Right); Lumbar facet arthropathy; and Lumbar foraminal stenosis (L5-S1) (Bilateral) on their problem list. Mr. Stucky was last seen on 11/20/2017. His primarily concern today is the Back Pain (lower/mid)  Pain Assessment: Location: Mid, Lower Back Radiating: pain radiaties up to neck Onset: More than a month ago Duration: Chronic pain Quality: Sharp Severity: 5 /10 (subjective, self-reported pain score)  Note: Reported level is compatible with observation.                         When using our objective Pain Scale, levels between 6 and 10/10 are said to belong in an emergency room, as it progressively worsens from a 6/10, described as severely limiting, requiring  emergency care not usually available at an outpatient pain management facility. At a 6/10 level, communication becomes difficult and requires great effort. Assistance to reach the emergency department may be required. Facial flushing and profuse sweating along with potentially dangerous increases in heart rate and blood pressure will be evident. Effect on ADL: limits daily activites Timing: Constant Modifying factors: lay down, medications BP:  HR: 83  The patient was last seen on 11/20/2017, at which time the plan was to: 1. Order an EMG/PNCV to evaluate his bilateral lower extremity weakness.  2. CT of the cervical spine to evaluate his decreased range of motion, cervicalgia, and bilateral radicular shoulder pain.  3. CT of the lumbar spine to evaluate his low back pain and bilateral lower extremity weakness.  4. Send a request for clearance from the prescribing physician to see if this is accurate.   At the time of my evaluation revealed that the patient was: 1. Not an ideal candidate for opioid therapy secondary to severe COPD, hypercapnia, oxygen dependence, and sleep apnea.  2. Not an ideal candidate for interventional therapies secondary to Coumadin anticoagulation. The patient's wife indicates that his unable to come off of the Coumadin for any amount of time, for any reason.   Further details on both, my assessment(s), as well as the proposed treatment plan, please see below.  Laboratory Chemistry  Inflammation Markers (CRP: Acute Phase) (ESR: Chronic Phase) Lab Results  Component Value Date   CRP 5.9 (H) 10/22/2017   ESRSEDRATE 32 (H) 10/22/2017                         Rheumatology Markers No results found.  Renal Function Markers Lab Results  Component Value Date   BUN 25 10/22/2017   CREATININE 1.20 10/22/2017   BCR 21 10/22/2017   GFRAA 72 10/22/2017   GFRNONAA 62 10/22/2017                             Hepatic Function Markers Lab Results  Component Value  Date   AST 26 10/22/2017   ALT 25 01/29/2015   ALBUMIN 4.2 10/22/2017   ALKPHOS 66 10/22/2017                        Electrolytes Lab Results  Component Value Date   NA 132 (L) 10/22/2017   K 5.1 10/22/2017   CL 92 (L) 10/22/2017   CALCIUM 9.5 10/22/2017   MG 2.0 10/22/2017   PHOS 4.4 05/24/2014                        Neuropathy Markers Lab Results  Component Value Date   VITAMINB12 1,565 (H) 10/22/2017   HGBA1C 5.3 06/02/2014                        Bone Pathology Markers Lab Results  Component Value Date   25OHVITD1 35 10/22/2017   25OHVITD2 <1.0 10/22/2017   25OHVITD3 35 10/22/2017                         Coagulation Parameters Lab Results  Component Value Date   INR 1.81 01/28/2017   LABPROT 20.8 (H) 01/28/2017   APTT 32.1 06/21/2014   PLT 112 (L) 01/28/2017                        Cardiovascular Markers Lab Results  Component Value Date   BNP 10,778 (H) 06/20/2014   CKTOTAL 111 06/20/2014   CKMB 29.4 (H) 06/20/2014   TROPONINI 13.00 (H) 06/20/2014   HGB 9.1 (L) 01/28/2017   HCT 27.9 (L) 01/28/2017  CA Markers No results found.  Note: Lab results reviewed.  Recent Diagnostic Imaging Review  Cervical Imaging: Cervical CT wo contrast:  Results for orders placed during the hospital encounter of 12/04/17  CT CERVICAL SPINE WO CONTRAST   Narrative CLINICAL DATA:  Chronic neck pain.  Cervicalgia.  EXAM: CT CERVICAL SPINE WITHOUT CONTRAST  TECHNIQUE: Multidetector CT imaging of the cervical spine was performed without intravenous contrast. Multiplanar CT image reconstructions were also generated.  COMPARISON:  Radiographs dated 10/23/2017  FINDINGS: Alignment: Normal.  Skull base and vertebrae: No fracture or bone destruction. Severe left facet arthritis at C2-3 and severe right facet arthritis at C3-4.  Soft tissues and spinal canal: Bilateral carotid artery calcifications, left more than right. There appears to be  a fairly severe stenosis of the origin of the left internal carotid artery on image 40 of series 3. Carotid Doppler examination may be useful for further evaluation.  C1-2: Moderate degenerative changes between the anterior arch of C1 and the odontoid process of C2.  Disc levels: C2-3: No significant disc bulging or protrusion. Severe left facet arthritis. Slight left foraminal narrowing.  C3-4: No disc bulging or protrusion. Severe right facet arthritis with uncinate spurs creating severe right foraminal stenosis.  C4-5: No significant abnormality of the disc. Slight narrowing of the right neural foramen.  C5-6: Disc space narrowing. No disc bulging or protrusion. Moderate bilateral foraminal stenosis, left greater than right.  C6-7: No significant abnormality of the disc. Widely patent neural foramina.  C7-T1: Disc space narrowing. No disc protrusion or disc bulging. No foraminal stenosis.  Upper chest: Negative.  Other: None  IMPRESSION: 1. Severe right facet arthritis at C3-4 and severe left facet arthritis at C2-3. 2. Right foraminal stenosis at C3-4. 3. Carotid artery calcification with possible proximal right internal carotid artery stenosis at the carotid bifurcation. Chronic Doppler examination recommended if not recently performed.  Electronically Signed   By: Lorriane Shire M.D.   On: 12/04/2017 09:38    Cervical DG Bending/F/E views:  Results for orders placed during the hospital encounter of 10/23/17  DG Cervical Spine With Flex & Extend   Narrative CLINICAL DATA:  Chronic lower back pain, chronic neck pain. Chronic bilateral shoulder pain;Pt has an external heart monitor (LVAP) that can't be moved;  EXAM: CERVICAL SPINE COMPLETE WITH FLEXION AND EXTENSION VIEWS  COMPARISON:  None.  FINDINGS: Normal alignment. No definite dynamic instability on lateral flexion/extension radiographs. No prevertebral soft tissue swelling. Negative for fracture.  Moderate narrowing of the C5-6 interspace. Anterior endplate spurring N4-O2. Previous median sternotomy. Left subclavian AICD partially visualized. Probable bilateral carotid arterial calcifications.  IMPRESSION: 1. Negative for fracture or dynamic instability. 2. Degenerative disc disease C5-6.  Electronically Signed   By: Lucrezia Europe M.D.   On: 10/23/2017 15:31    Shoulder Imaging: Shoulder-R DG:  Results for orders placed during the hospital encounter of 10/23/17  DG Shoulder Right   Narrative CLINICAL DATA:  Chronic lower back pain, chronic neck pain. Chronic bilateral shoulder pain;Pt has an external heart monitor (LVAP) that can't be moved;  EXAM: RIGHT SHOULDER - 2+ VIEW  COMPARISON:  None.  FINDINGS: There is no evidence of fracture or dislocation. There is no evidence of arthropathy or other focal bone abnormality. Soft tissues are unremarkable.  IMPRESSION: Negative.  Electronically Signed   By: Lucrezia Europe M.D.   On: 10/23/2017 15:32    Shoulder-L DG:  Results for orders placed during the hospital encounter of 10/23/17  DG  Shoulder Left   Narrative CLINICAL DATA:  Chronic lower back pain, chronic neck pain. Chronic bilateral shoulder pain;Pt has an external heart monitor (LVAP) that can't be moved;  EXAM: LEFT SHOULDER - 2+ VIEW  COMPARISON:  None.  FINDINGS: There is no evidence of fracture or dislocation. There is no evidence of arthropathy or other focal bone abnormality. Soft tissues are unremarkable. Left subclavian AICD partially visualized. Previous median sternotomy.  IMPRESSION: Negative.  Electronically Signed   By: Lucrezia Europe M.D.   On: 10/23/2017 15:32    Lumbosacral Imaging: Lumbar CT wo contrast:  Results for orders placed during the hospital encounter of 12/04/17  CT LUMBAR SPINE WO CONTRAST   Narrative CLINICAL DATA:  Low back pain.  Bilateral foot pain.  EXAM: CT LUMBAR SPINE WITHOUT  CONTRAST  TECHNIQUE: Multidetector CT imaging of the lumbar spine was performed without intravenous contrast administration. Multiplanar CT image reconstructions were also generated.  COMPARISON:  Radiographs dated 10/23/2017 and CT scan dated 06/20/2011  FINDINGS: Segmentation: 5 lumbar type vertebrae.  Alignment: Normal.  Vertebrae: No fractures or bone destruction. Severe left facet arthritis at L5-S1. Osteophytes fuse both sacroiliac joints anteriorly.  Paraspinal and other soft tissues: Aortic atherosclerosis.  Disc levels: T11-12: Tiny Schmorl's node in the inferior endplate of X73. Otherwise negative.  T12-L1: Tiny Schmorl's node in the inferior endplate of Z32. Otherwise negative.  L1-2: Tiny broad-based disc bulge with no neural impingement. Tiny Schmorl's nodes in the endplates.  L2-3: Tiny Schmorl's node in the inferior endplate of L2. Slight bilateral facet arthritis. No disc bulging or protrusion.  L3-4: Minimal broad-based disc bulge with no neural impingement. Mild bilateral facet arthritis.  L4-5: Tiny broad-based disc bulge with no neural impingement. Slight degenerative changes of the facet joints.  L5-S1: Slight disc space narrowing. Chronic broad-based calcified disc protrusion extending into both neural foramina, with severe bilateral foraminal stenosis secondary to the chronic calcified disc protrusions, unchanged on the left and slightly increased foraminal stenosis on the right. Increased severe left and moderate right facet arthritis with chronic bilateral lateral recess impingement.  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  IMPRESSION: 1. Chronic severe bilateral foraminal stenosis at L5-S1, progressed on the right due to chronic calcified disc protrusions in the neural foramina. 2. Progressive severe left facet arthritis at L5-S1. 3. Diffuse mild  degenerative changes in the remainder of the lumbar spine, essentially unchanged. 4. Chronic fusion of the sacroiliac joints. 5.  Aortic Atherosclerosis (ICD10-I70.0). 6. Maximum diameter of the abdominal aorta is 2.5 cm. The appearance on the prior lumbar radiographs dated 10/23/2017 suggested a 3.5 cm aneurysm but there is no evidence of an aneurysm of that size. The appearance on radiographs is felt to be due to magnification affect. Therefore, follow-up ultrasound of the abdominal aorta is not suggested at this time.   Electronically Signed   By: Lorriane Shire M.D.   On: 12/04/2017 09:25    Lumbar DG Bending views:  Results for orders placed during the hospital encounter of 10/23/17  DG Lumbar Spine Complete W/Bend   Narrative CLINICAL DATA:  Chronic lower back pain, chronic neck pain. Chronic bilateral shoulder pain;Pt has an external heart monitor (LVAP) that can't be moved;  EXAM: LUMBAR SPINE - COMPLETE WITH BENDING VIEWS  COMPARISON:  CT 05/06/2014  FINDINGS: There is no evidence of lumbar spine fracture. Alignment is normal. Intervertebral disc spaces are maintained. Bilateral facet  DJD L5-S1, left worse than right. LVAD device partially visualized. 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  IMPRESSION: 1. Negative for fracture or other acute bone abnormality. 2. Facet DJD L5-S1 left greater than right. 3. 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 Recommend follow up by [place] in 2years. This recommendation follows ACR consensus guidelines: White Paper of the ACR Incidental Findings Committee II on Vascular Findings. Joellyn Rued [id number]; 32:[id number].   Electronically Signed   By: Lucrezia Europe M.D.   On: 10/23/2017 15:30    Foot Imaging: Foot-R DG Complete:  Results for orders placed during the hospital encounter of 08/13/16  DG Foot Complete Right   Narrative CLINICAL DATA:   Right foot pain for 3 weeks, dorsal contusion with oxygen tank 2 weeks ago  EXAM: RIGHT FOOT COMPLETE - 3+ VIEW  COMPARISON:  None.  FINDINGS: Three views of the right foot submitted. No acute fracture or subluxation. No radiopaque foreign body. There is soft tissue swelling dorsal metatarsal and tarsal region. Tiny plantar spur of calcaneus.  IMPRESSION: No acute fracture or subluxation. Soft tissue swelling dorsal metatarsal and tarsal region. Tiny plantar spur of calcaneus.   Electronically Signed   By: Lahoma Crocker M.D.   On: 08/13/2016 15:36    Complexity Note: Imaging results reviewed. Results shared with Mr. Dasch, using Layman's terms.                         Meds   Current Outpatient Medications:  .  albuterol (PROVENTIL) (2.5 MG/3ML) 0.083% nebulizer solution, Inhale 3 mLs into the lungs every 6 (six) hours as needed., Disp: , Rfl:  .  allopurinol (ZYLOPRIM) 100 MG tablet, Take 100 mg by mouth daily., Disp: , Rfl:  .  ALPRAZolam (XANAX) 0.5 MG tablet, Take 0.5 mg by mouth 2 (two) times daily as needed. ONE IN THE MORNING AND ONE AT BEDTIME PRN, Disp: , Rfl:  .  aspirin EC 81 MG tablet, Take 81 mg by mouth daily., Disp: , Rfl:  .  budesonide (PULMICORT) 0.5 MG/2ML nebulizer solution, Inhale 2 mLs into the lungs 2 (two) times daily., Disp: , Rfl:  .  cyanocobalamin 100 MCG tablet, Take 100 mcg by mouth daily., Disp: , Rfl:  .  ferrous sulfate 324 (65 Fe) MG TBEC, Take 324 mg by mouth every morning. WITH BREAKFAST, Disp: , Rfl:  .  FLUoxetine (PROZAC) 20 MG capsule, Take 80 mg by mouth daily., Disp: , Rfl:  .  gabapentin (NEURONTIN) 300 MG capsule, Take 600 mg by mouth 3 (three) times daily. , Disp: , Rfl:  .  magnesium oxide (MAG-OX) 400 MG tablet, Take 400 mg by mouth daily., Disp: , Rfl:  .  mirtazapine (REMERON) 30 MG tablet, Take 0.5 tablets by mouth at bedtime., Disp: , Rfl:  .  Multiple Vitamins-Minerals (MULTIVITAMIN WITH MINERALS) tablet, Take 1 tablet by  mouth daily., Disp: , Rfl:  .  ondansetron (ZOFRAN) 4 MG tablet, Take 4 mg by mouth every 8 (eight) hours as needed for nausea., Disp: , Rfl:  .  oxycodone (OXY-IR) 5 MG capsule, Take 5 mg by mouth every 4 (four) hours as needed., Disp: , Rfl:  .  umeclidinium-vilanterol (ANORO ELLIPTA) 62.5-25 MCG/INH AEPB, Inhale 1 puff into the lungs daily., Disp: , Rfl:  .  warfarin (COUMADIN) 3 MG tablet, Take 3 mg by mouth 3 (three) times a week., Disp: , Rfl:  .  warfarin (COUMADIN) 4 MG tablet, Take 4 mg by mouth 4 (four) times a week., Disp: , Rfl:  .  cetirizine (ZYRTEC) 10 MG tablet, Take 10 mg by mouth daily., Disp: , Rfl:  .  lisinopril (PRINIVIL,ZESTRIL) 2.5 MG tablet, Take 2.5 mg by mouth daily., Disp: , Rfl:  .  metoprolol tartrate (LOPRESSOR) 25 MG tablet, Take 25 mg by mouth 2 (two) times daily., Disp: , Rfl:  .  montelukast (SINGULAIR) 10 MG tablet, Take 10 mg by mouth at bedtime., Disp: , Rfl:  .  pantoprazole (PROTONIX) 40 MG tablet, Take 40 mg by mouth daily., Disp: , Rfl:  .  pravastatin (PRAVACHOL) 40 MG tablet, Take 40 mg by mouth at bedtime., Disp: , Rfl:  .  spironolactone (ALDACTONE) 25 MG tablet, Take 25 mg by mouth daily., Disp: , Rfl:  .  torsemide (DEMADEX) 20 MG tablet, Take 2 tablets by mouth daily., Disp: , Rfl:  .  warfarin (COUMADIN) 5 MG tablet, Take 5 mg by mouth as directed. Taking 5 mg M, W, F and 4 mg T, Th, Sat, Sun., Disp: , Rfl:   ROS  Constitutional: Denies any fever or chills Gastrointestinal: No reported hemesis, hematochezia, vomiting, or acute GI distress Musculoskeletal: Denies any acute onset joint swelling, redness, loss of ROM, or weakness Neurological: No reported episodes of acute onset apraxia, aphasia, dysarthria, agnosia, amnesia, paralysis, loss of coordination, or loss of consciousness  Allergies  Mr. Ruderman is allergic to bee venom and coreg [carvedilol].  PFSH  Drug: Mr. Judice  reports that he does not use drugs. Alcohol:  reports that he  does not drink alcohol. Tobacco:  reports that he quit smoking about 12 years ago. His smoking use included cigarettes. He has a 45.00 pack-year smoking history. He has never used smokeless tobacco. Medical:  has a past medical history of Arthritis, Chronic back pain, Diabetes mellitus without complication ([place]), Hypertension, and Neuropathy. Surgical: Mr. Hartog  has a past surgical history that includes Pain pump revision; LVAP (2016); and ICD IMPLANT (2016). Family: family history includes Heart disease in his mother.  Constitutional Exam  General appearance: Well nourished, well developed, and well hydrated. In no apparent acute distress Vitals:   12/11/17 0907  Pulse: 83  Temp: 98.4 F (36.9 C)  SpO2: 95%  Weight: 180 lb (81.6 kg)  Height: 5' 10"  (1.778 m)   BMI Assessment: Estimated body mass index is 25.83 kg/m as calculated from the following:   Height as of this encounter: 5' 10"  (1.778 m).   Weight as of this encounter: 180 lb (81.6 kg).  BMI interpretation table: BMI level Category Range association with higher incidence of chronic pain  <18 kg/m2 Underweight   18.5-24.9 kg/m2 Ideal body weight   25-29.9 kg/m2 Overweight Increased incidence by 20%  30-34.9 kg/m2 Obese (Class I) Increased incidence by 68%  35-39.9 kg/m2 Severe obesity (Class II) Increased incidence by 136%  >40 kg/m2 Extreme obesity (Class III) Increased incidence by 254%   Patient's current BMI Ideal Body weight  Body mass index is 25.83 kg/m. Ideal body weight: 73 kg (160 lb 15 oz) Adjusted ideal body weight: 76.5 kg (168 lb 9 oz)   BMI Readings from Last 4 Encounters:  12/11/17 25.83 kg/m  11/20/17 25.83 kg/m  10/22/17 25.83 kg/m  09/30/17 27.01 kg/m   Wt Readings from Last 4 Encounters:  12/11/17 180 lb (81.6 kg)  11/20/17 180 lb (81.6 kg)  10/22/17 180 lb (81.6 kg)  09/30/17 181 lb 4.8  oz (82.2 kg)  Psych/Mental status: Alert, oriented x 3 (person, place, & time)       Eyes:  PERLA Respiratory: No evidence of acute respiratory distress  Cervical Spine Area Exam  Skin & Axial Inspection: No masses, redness, edema, swelling, or associated skin lesions Alignment: Symmetrical Functional ROM: Unrestricted ROM      Stability: No instability detected Muscle Tone/Strength: Functionally intact. No obvious neuro-muscular anomalies detected. Sensory (Neurological): Unimpaired Palpation: No palpable anomalies              Upper Extremity (UE) Exam    Side: Right upper extremity  Side: Left upper extremity  Skin & Extremity Inspection: Skin color, temperature, and hair growth are WNL. No peripheral edema or cyanosis. No masses, redness, swelling, asymmetry, or associated skin lesions. No contractures.  Skin & Extremity Inspection: Skin color, temperature, and hair growth are WNL. No peripheral edema or cyanosis. No masses, redness, swelling, asymmetry, or associated skin lesions. No contractures.  Functional ROM: Unrestricted ROM          Functional ROM: Unrestricted ROM          Muscle Tone/Strength: Functionally intact. No obvious neuro-muscular anomalies detected.  Muscle Tone/Strength: Functionally intact. No obvious neuro-muscular anomalies detected.  Sensory (Neurological): Unimpaired          Sensory (Neurological): Unimpaired          Palpation: No palpable anomalies              Palpation: No palpable anomalies              Provocative Test(s):  Phalen's test: deferred Tinel's test: deferred Apley's scratch test (touch opposite shoulder):  Action 1 (Across chest): deferred Action 2 (Overhead): deferred Action 3 (LB reach): deferred   Provocative Test(s):  Phalen's test: deferred Tinel's test: deferred Apley's scratch test (touch opposite shoulder):  Action 1 (Across chest): deferred Action 2 (Overhead): deferred Action 3 (LB reach): deferred    Thoracic Spine Area Exam  Skin & Axial Inspection: No masses, redness, or swelling Alignment:  Symmetrical Functional ROM: Unrestricted ROM Stability: No instability detected Muscle Tone/Strength: Functionally intact. No obvious neuro-muscular anomalies detected. Sensory (Neurological): Unimpaired Muscle strength & Tone: No palpable anomalies  Lumbar Spine Area Exam  Skin & Axial Inspection: No masses, redness, or swelling Alignment: Symmetrical Functional ROM: Decreased ROM       Stability: No instability detected Muscle Tone/Strength: Increased muscle tone over affected area Sensory (Neurological): Movement-associated pain Palpation: Complains of area being tender to palpation       Provocative Tests: Lumbar Hyperextension/rotation test: (+) bilaterally for facet joint pain. Lumbar quadrant test (Kemp's test): (+) bilaterally for facet joint pain. Lumbar Lateral bending test: deferred today       Patrick's Maneuver: deferred today                   FABER test: deferred today                   Thigh-thrust test: deferred today       S-I compression test: deferred today       S-I distraction test: deferred today        Gait & Posture Assessment  Ambulation: Unassisted Gait: Relatively normal for age and body habitus Posture: Antalgic   Lower Extremity Exam    Side: Right lower extremity  Side: Left lower extremity  Stability: No instability observed          Stability: No instability  observed          Skin & Extremity Inspection: Skin color, temperature, and hair growth are WNL. No peripheral edema or cyanosis. No masses, redness, swelling, asymmetry, or associated skin lesions. No contractures.  Skin & Extremity Inspection: Skin color, temperature, and hair growth are WNL. No peripheral edema or cyanosis. No masses, redness, swelling, asymmetry, or associated skin lesions. No contractures.  Functional ROM: Unrestricted ROM                  Functional ROM: Unrestricted ROM                  Muscle Tone/Strength: Functionally intact. No obvious neuro-muscular anomalies  detected.  Muscle Tone/Strength: Functionally intact. No obvious neuro-muscular anomalies detected.  Sensory (Neurological): Unimpaired  Sensory (Neurological): Unimpaired  Palpation: No palpable anomalies  Palpation: No palpable anomalies   Assessment  Primary Diagnosis & Pertinent Problem List: The primary encounter diagnosis was Chronic pain syndrome. 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  Status Diagnosis  Persistent Persistent Persistent 1. Chronic pain syndrome   2. Chronic low back pain (Primary Area of Pain) (Bilateral)   3. 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 Lumbar facet arthropathy   5. Spondylosis without myelopathy or radiculopathy, lumbosacral region   6. Chronic sacroiliac joint pain (Left)   7. Chronic neck pain (Secondary Area of Pain) (Bilateral) (L>R)   8. Cervical facet syndrome   9. Cervical Facet Arthropathy (Bilateral)   10. Chronic shoulder pain (Tertiary Area of Pain) (Bilateral) (L>R)   11. 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 Lumbar foraminal stenosis (L5-S1) (Bilateral)   13. Chronic anticoagulation (Coumadin)   14. 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   Chronic sacroiliac joint pain (Left)   Chronic fusion of the sacroiliac joints.   Long Term Prescription Benzodiazepine Use   Plan of Care  Pharmacotherapy (Medications Ordered): No orders of the defined types were placed in this encounter.  Medications administered today: Burlin L. Ruda had no medications administered during this visit.   Procedure Orders     LUMBAR FACET(MEDIAL BRANCH NERVE BLOCK) [facility] Lab Orders  No laboratory test(s) ordered today   Imaging Orders  No imaging studies ordered today   Referral Orders  No referral(s) requested today   Interventional management options: Planned, scheduled, and/or pending:   Diagnostic bilateral lumbar facet nerve block #1 (PRN - he wants to think about it.)(Pending clearance to temporarily stop anticoagulants.)   Considering:   The  list below is valid only if he is able to safely stop his coumadin x 5 days prior to procedures. Diagnostic bilateral lumbar facet nerve block  Possiblebilateral lumbar facet RFA  Diagnosticbilateral cervical facet nerve block  Possiblebilateral cervical facet RFA  Diagnosticbilateral intra-articular shoulder injections  Diagnosticbilateral suprascapular nerve block  Possiblebilateral suprascapular nerve RFA    Palliative PRN treatment(s):   None at this time   Provider-requested follow-up: Return for PRN Procedure: (B) L-FCT BLK #1, (Blood-thinner Protocol).  Future Appointments  Date Time Provider [place]  12/27/2017 10:00 AM [facility] None  12/30/2017 10:00 AM  [facility] None   Primary Care Physician: Juluis Pitch, MD Location: [facility] Note by: Gaspar Cola, MD Date: 12/11/2017; Time: 10:13 AM

## 2017-12-11 ENCOUNTER — Other Ambulatory Visit: Payer: Self-pay

## 2017-12-11 ENCOUNTER — Encounter: Payer: Self-pay | Admitting: Pain Medicine

## 2017-12-11 ENCOUNTER — Ambulatory Visit: Payer: Medicare Other | Attending: Pain Medicine | Admitting: Pain Medicine

## 2017-12-11 VITALS — HR 83 | Temp 98.4°F | Ht 70.0 in | Wt 180.0 lb

## 2017-12-11 DIAGNOSIS — M545 Low back pain, unspecified: Secondary | ICD-10-CM

## 2017-12-11 DIAGNOSIS — F418 Other specified anxiety disorders: Secondary | ICD-10-CM | POA: Diagnosis not present

## 2017-12-11 DIAGNOSIS — J439 Emphysema, unspecified: Secondary | ICD-10-CM | POA: Diagnosis not present

## 2017-12-11 DIAGNOSIS — M4802 Spinal stenosis, cervical region: Secondary | ICD-10-CM

## 2017-12-11 DIAGNOSIS — M4692 Unspecified inflammatory spondylopathy, cervical region: Secondary | ICD-10-CM | POA: Insufficient documentation

## 2017-12-11 DIAGNOSIS — R700 Elevated erythrocyte sedimentation rate: Secondary | ICD-10-CM | POA: Insufficient documentation

## 2017-12-11 DIAGNOSIS — M5136 Other intervertebral disc degeneration, lumbar region: Secondary | ICD-10-CM | POA: Diagnosis not present

## 2017-12-11 DIAGNOSIS — E119 Type 2 diabetes mellitus without complications: Secondary | ICD-10-CM | POA: Insufficient documentation

## 2017-12-11 DIAGNOSIS — Z09 Encounter for follow-up examination after completed treatment for conditions other than malignant neoplasm: Secondary | ICD-10-CM | POA: Insufficient documentation

## 2017-12-11 DIAGNOSIS — M47816 Spondylosis without myelopathy or radiculopathy, lumbar region: Secondary | ICD-10-CM | POA: Diagnosis not present

## 2017-12-11 DIAGNOSIS — M47817 Spondylosis without myelopathy or radiculopathy, lumbosacral region: Secondary | ICD-10-CM

## 2017-12-11 DIAGNOSIS — M5416 Radiculopathy, lumbar region: Secondary | ICD-10-CM | POA: Diagnosis not present

## 2017-12-11 DIAGNOSIS — M533 Sacrococcygeal disorders, not elsewhere classified: Secondary | ICD-10-CM

## 2017-12-11 DIAGNOSIS — I110 Hypertensive heart disease with heart failure: Secondary | ICD-10-CM | POA: Diagnosis not present

## 2017-12-11 DIAGNOSIS — F411 Generalized anxiety disorder: Secondary | ICD-10-CM | POA: Insufficient documentation

## 2017-12-11 DIAGNOSIS — M48062 Spinal stenosis, lumbar region with neurogenic claudication: Secondary | ICD-10-CM | POA: Insufficient documentation

## 2017-12-11 DIAGNOSIS — I38 Endocarditis, valve unspecified: Secondary | ICD-10-CM | POA: Insufficient documentation

## 2017-12-11 DIAGNOSIS — I5023 Acute on chronic systolic (congestive) heart failure: Secondary | ICD-10-CM | POA: Insufficient documentation

## 2017-12-11 DIAGNOSIS — M5412 Radiculopathy, cervical region: Secondary | ICD-10-CM | POA: Insufficient documentation

## 2017-12-11 DIAGNOSIS — I255 Ischemic cardiomyopathy: Secondary | ICD-10-CM | POA: Diagnosis not present

## 2017-12-11 DIAGNOSIS — K922 Gastrointestinal hemorrhage, unspecified: Secondary | ICD-10-CM | POA: Insufficient documentation

## 2017-12-11 DIAGNOSIS — D696 Thrombocytopenia, unspecified: Secondary | ICD-10-CM | POA: Insufficient documentation

## 2017-12-11 DIAGNOSIS — T82897A Other specified complication of cardiac prosthetic devices, implants and grafts, initial encounter: Secondary | ICD-10-CM | POA: Insufficient documentation

## 2017-12-11 DIAGNOSIS — F40240 Claustrophobia: Secondary | ICD-10-CM | POA: Insufficient documentation

## 2017-12-11 DIAGNOSIS — H9190 Unspecified hearing loss, unspecified ear: Secondary | ICD-10-CM | POA: Diagnosis not present

## 2017-12-11 DIAGNOSIS — E1140 Type 2 diabetes mellitus with diabetic neuropathy, unspecified: Secondary | ICD-10-CM | POA: Insufficient documentation

## 2017-12-11 DIAGNOSIS — M4697 Unspecified inflammatory spondylopathy, lumbosacral region: Secondary | ICD-10-CM | POA: Insufficient documentation

## 2017-12-11 DIAGNOSIS — G894 Chronic pain syndrome: Secondary | ICD-10-CM | POA: Diagnosis not present

## 2017-12-11 DIAGNOSIS — M48061 Spinal stenosis, lumbar region without neurogenic claudication: Secondary | ICD-10-CM

## 2017-12-11 DIAGNOSIS — K5900 Constipation, unspecified: Secondary | ICD-10-CM | POA: Insufficient documentation

## 2017-12-11 DIAGNOSIS — I480 Paroxysmal atrial fibrillation: Secondary | ICD-10-CM | POA: Insufficient documentation

## 2017-12-11 DIAGNOSIS — E781 Pure hyperglyceridemia: Secondary | ICD-10-CM | POA: Insufficient documentation

## 2017-12-11 DIAGNOSIS — M129 Arthropathy, unspecified: Secondary | ICD-10-CM | POA: Insufficient documentation

## 2017-12-11 DIAGNOSIS — E079 Disorder of thyroid, unspecified: Secondary | ICD-10-CM | POA: Insufficient documentation

## 2017-12-11 DIAGNOSIS — Z87891 Personal history of nicotine dependence: Secondary | ICD-10-CM | POA: Insufficient documentation

## 2017-12-11 DIAGNOSIS — Z79891 Long term (current) use of opiate analgesic: Secondary | ICD-10-CM | POA: Insufficient documentation

## 2017-12-11 DIAGNOSIS — R7982 Elevated C-reactive protein (CRP): Secondary | ICD-10-CM | POA: Insufficient documentation

## 2017-12-11 DIAGNOSIS — K7581 Nonalcoholic steatohepatitis (NASH): Secondary | ICD-10-CM | POA: Insufficient documentation

## 2017-12-11 DIAGNOSIS — Z7901 Long term (current) use of anticoagulants: Secondary | ICD-10-CM

## 2017-12-11 DIAGNOSIS — Z955 Presence of coronary angioplasty implant and graft: Secondary | ICD-10-CM | POA: Insufficient documentation

## 2017-12-11 DIAGNOSIS — I2510 Atherosclerotic heart disease of native coronary artery without angina pectoris: Secondary | ICD-10-CM | POA: Insufficient documentation

## 2017-12-11 DIAGNOSIS — G8929 Other chronic pain: Secondary | ICD-10-CM

## 2017-12-11 DIAGNOSIS — D72829 Elevated white blood cell count, unspecified: Secondary | ICD-10-CM | POA: Insufficient documentation

## 2017-12-11 DIAGNOSIS — I4901 Ventricular fibrillation: Secondary | ICD-10-CM | POA: Insufficient documentation

## 2017-12-11 DIAGNOSIS — M25512 Pain in left shoulder: Secondary | ICD-10-CM | POA: Diagnosis not present

## 2017-12-11 DIAGNOSIS — M9983 Other biomechanical lesions of lumbar region: Secondary | ICD-10-CM

## 2017-12-11 DIAGNOSIS — M25511 Pain in right shoulder: Secondary | ICD-10-CM | POA: Insufficient documentation

## 2017-12-11 DIAGNOSIS — I472 Ventricular tachycardia: Secondary | ICD-10-CM | POA: Insufficient documentation

## 2017-12-11 DIAGNOSIS — J09X2 Influenza due to identified novel influenza A virus with other respiratory manifestations: Secondary | ICD-10-CM | POA: Insufficient documentation

## 2017-12-11 DIAGNOSIS — I351 Nonrheumatic aortic (valve) insufficiency: Secondary | ICD-10-CM | POA: Insufficient documentation

## 2017-12-11 DIAGNOSIS — Z791 Long term (current) use of non-steroidal anti-inflammatories (NSAID): Secondary | ICD-10-CM | POA: Insufficient documentation

## 2017-12-11 DIAGNOSIS — R040 Epistaxis: Secondary | ICD-10-CM | POA: Insufficient documentation

## 2017-12-11 DIAGNOSIS — D500 Iron deficiency anemia secondary to blood loss (chronic): Secondary | ICD-10-CM | POA: Insufficient documentation

## 2017-12-11 DIAGNOSIS — M1A9XX0 Chronic gout, unspecified, without tophus (tophi): Secondary | ICD-10-CM | POA: Insufficient documentation

## 2017-12-11 DIAGNOSIS — Z8249 Family history of ischemic heart disease and other diseases of the circulatory system: Secondary | ICD-10-CM | POA: Insufficient documentation

## 2017-12-11 DIAGNOSIS — M9981 Other biomechanical lesions of cervical region: Secondary | ICD-10-CM

## 2017-12-11 DIAGNOSIS — J9622 Acute and chronic respiratory failure with hypercapnia: Secondary | ICD-10-CM | POA: Insufficient documentation

## 2017-12-11 DIAGNOSIS — Z7952 Long term (current) use of systemic steroids: Secondary | ICD-10-CM | POA: Insufficient documentation

## 2017-12-11 DIAGNOSIS — Z7982 Long term (current) use of aspirin: Secondary | ICD-10-CM | POA: Insufficient documentation

## 2017-12-11 DIAGNOSIS — G4733 Obstructive sleep apnea (adult) (pediatric): Secondary | ICD-10-CM | POA: Insufficient documentation

## 2017-12-11 DIAGNOSIS — M542 Cervicalgia: Secondary | ICD-10-CM

## 2017-12-11 DIAGNOSIS — M47812 Spondylosis without myelopathy or radiculopathy, cervical region: Secondary | ICD-10-CM

## 2017-12-11 DIAGNOSIS — Z79899 Other long term (current) drug therapy: Secondary | ICD-10-CM

## 2017-12-11 DIAGNOSIS — I493 Ventricular premature depolarization: Secondary | ICD-10-CM | POA: Insufficient documentation

## 2017-12-11 DIAGNOSIS — E785 Hyperlipidemia, unspecified: Secondary | ICD-10-CM | POA: Insufficient documentation

## 2017-12-11 NOTE — Patient Instructions (Signed)
____________________________________________________________________________________________  Preparing for Procedure with Sedation  Instructions: . Oral Intake: Do not eat or drink anything for at least 8 hours prior to your procedure. . Transportation: Public transportation is not allowed. Bring an adult driver. The driver must be physically present in our waiting room before any procedure can be started. Marland Kitchen Physical Assistance: Bring an adult physically capable of assisting you, in the event you need help. This adult should keep you company at home for at least 6 hours after the procedure. . Blood Pressure Medicine: Take your blood pressure medicine with a sip of water the morning of the procedure. . Blood thinners:  . Diabetics on insulin: Notify the staff so that you can be scheduled 1st case in the morning. If your diabetes requires high dose insulin, take only  of your normal insulin dose the morning of the procedure and notify the staff that you have done so. . Preventing infections: Shower with an antibacterial soap the morning of your procedure. . Build-up your immune system: Take 1000 mg of Vitamin C with every meal (3 times a day) the day prior to your procedure. Marland Kitchen Antibiotics: Inform the staff if you have a condition or reason that requires you to take antibiotics before dental procedures. . Pregnancy: If you are pregnant, call and cancel the procedure. . Sickness: If you have a cold, fever, or any active infections, call and cancel the procedure. . Arrival: You must be in the facility at least 30 minutes prior to your scheduled procedure. . Children: Do not bring children with you. . Dress appropriately: Bring dark clothing that you would not mind if they get stained. . Valuables: Do not bring any jewelry or valuables.  Procedure appointments are reserved for interventional treatments only. Marland Kitchen No Prescription Refills. . No medication changes will be discussed during procedure  appointments. . No disability issues will be discussed.  Remember:  Regular Business hours are:  Monday to Thursday 8:00 AM to 4:00 PM  Provider's Schedule: Milinda Pointer, MD:  Procedure days: Tuesday and Thursday 7:30 AM to 4:00 PM  Gillis Santa, MD:  Procedure days: Monday and Wednesday 7:30 AM to 4:00 PM ____________________________________________________________________________________________   ____________________________________________________________________________________________  Blood Thinners  Recommended Time Interval Before and After Neuraxial Block or Catheter Removal  Drug (Generic) Brand Name Time Before Time After Comments  Abciximab Reopro 15 days 2 hours   Alteplase Activase 10 days 10 days   Apixaban Eliquis 3 days 6 hours   Aspirin > 325 mg Goody Powders/Excedrin 11 days  (Usually not stopped)  Aspirin ? 81 mg  7 days  (Usually not stopped)  Cholesterol Medication Lipitor 4 days    Cilostazol Pletal 3 days 5 hours   Clopidogrel Plavix 7-10 days 2 hours   Dabigatran Pradaxa 5 days 6 hours   Delteparin Fragmin 24 hours 4 hours   Dipyridamole + ASA Aggrenox 11days 2 hours   Enoxaparin  Lovenox 24 hours 4 hours   Eptifibatide Integrillin 8 hours 2 hours   Fish oil  4 days    Fondaparinux  Arixtra 72 hours 12 hours   Garlic supplements  7 days    Ginkgo biloba  36 hours    Ginseng  24 hours    Heparin (IV)  4 hours 2 hours   Heparin ([place])  12 hours 2 hours   Hydroxychloroquine Plaquenil 11 days    LMW Heparin  24 hours    LMWH  24 hours    NSAIDs  3 days  (  Usually not stopped)  Prasugrel Effient 7-10 days 6 hours   Reteplase Retavase 10 days 10 days   Rivaroxaban Xarelto 3 days 6 hours   Streptokinase Streptase 10 days 10 days   Tenecteplase TNKase 10 days 10 days   Thrombolytics  10 days  10 days Avoid x 10 days after inj.  Ticagrelor Brilinta 5-7 days 6 hours   Ticlodipine Ticlid 10-14 days 2 hours   Tinzaparin Innohep 24 hours 4 hours    Tirofiban Aggrastat 8 hours 2 hours   Vitamin E  4 days    Warfarin Coumadin 5 days 2 hours   ____________________________________________________________________________________________

## 2017-12-12 ENCOUNTER — Telehealth: Payer: Self-pay | Admitting: Pain Medicine

## 2017-12-12 NOTE — Telephone Encounter (Signed)
Patients daughter Vanessa Kick called stating patient went to physician that prescribed blood thinners. That physician said patient could not have procedure because he cannot stop meds. Is there anything else Dr. Dossie Arbour can do for this patient ? Please call and let patient know what is next step

## 2017-12-13 NOTE — Telephone Encounter (Signed)
Ms. Owens Shark contacted, informed her that Mr. Pound need appointment with Dr. Dossie Arbour to discuss other options that may be available with consideration of Mr. Biagini's complicated medical history.

## 2017-12-18 DIAGNOSIS — J449 Chronic obstructive pulmonary disease, unspecified: Secondary | ICD-10-CM

## 2017-12-23 DIAGNOSIS — J449 Chronic obstructive pulmonary disease, unspecified: Secondary | ICD-10-CM

## 2017-12-23 NOTE — Progress Notes (Signed)
Pulmonary Individual Treatment Plan  Patient Details  Name: William Bray MRN: [id number] Date of Birth: [birth year] Referring Provider:     Pulmonary Rehab from 09/30/2017 in [facility] Cardiac and Pulmonary Rehab  Referring Provider  [name] MD      Initial Encounter Date:    Pulmonary Rehab from 09/30/2017 in [facility] Cardiac and Pulmonary Rehab  Date  09/30/17      Visit Diagnosis: Chronic obstructive pulmonary disease, unspecified COPD type ([place])  Patient's Home Medications on Admission:  Current Outpatient Medications:  .  albuterol (PROVENTIL) (2.5 MG/3ML) 0.083% nebulizer solution, Inhale 3 mLs into the lungs every 6 (six) hours as needed., Disp: , Rfl:  .  allopurinol (ZYLOPRIM) 100 MG tablet, Take 100 mg by mouth daily., Disp: , Rfl:  .  ALPRAZolam (XANAX) 0.5 MG tablet, Take 0.5 mg by mouth 2 (two) times daily as needed. ONE IN THE MORNING AND ONE AT BEDTIME PRN, Disp: , Rfl:  .  aspirin EC 81 MG tablet, Take 81 mg by mouth daily., Disp: , Rfl:  .  budesonide (PULMICORT) 0.5 MG/2ML nebulizer solution, Inhale 2 mLs into the lungs 2 (two) times daily., Disp: , Rfl:  .  cetirizine (ZYRTEC) 10 MG tablet, Take 10 mg by mouth daily., Disp: , Rfl:  .  cyanocobalamin 100 MCG tablet, Take 100 mcg by mouth daily., Disp: , Rfl:  .  ferrous sulfate 324 (65 Fe) MG TBEC, Take 324 mg by mouth every morning. WITH BREAKFAST, Disp: , Rfl:  .  FLUoxetine (PROZAC) 20 MG capsule, Take 80 mg by mouth daily., Disp: , Rfl:  .  gabapentin (NEURONTIN) 300 MG capsule, Take 600 mg by mouth 3 (three) times daily. , Disp: , Rfl:  .  lisinopril (PRINIVIL,ZESTRIL) 2.5 MG tablet, Take 2.5 mg by mouth daily., Disp: , Rfl:  .  magnesium oxide (MAG-OX) 400 MG tablet, Take 400 mg by mouth daily., Disp: , Rfl:  .  metoprolol tartrate (LOPRESSOR) 25 MG tablet, Take 25 mg by mouth 2 (two) times daily., Disp: , Rfl:  .  mirtazapine (REMERON) 30 MG tablet, Take 0.5 tablets by mouth at bedtime., Disp: , Rfl:  .   montelukast (SINGULAIR) 10 MG tablet, Take 10 mg by mouth at bedtime., Disp: , Rfl:  .  Multiple Vitamins-Minerals (MULTIVITAMIN WITH MINERALS) tablet, Take 1 tablet by mouth daily., Disp: , Rfl:  .  ondansetron (ZOFRAN) 4 MG tablet, Take 4 mg by mouth every 8 (eight) hours as needed for nausea., Disp: , Rfl:  .  oxycodone (OXY-IR) 5 MG capsule, Take 5 mg by mouth every 4 (four) hours as needed., Disp: , Rfl:  .  pantoprazole (PROTONIX) 40 MG tablet, Take 40 mg by mouth daily., Disp: , Rfl:  .  pravastatin (PRAVACHOL) 40 MG tablet, Take 40 mg by mouth at bedtime., Disp: , Rfl:  .  spironolactone (ALDACTONE) 25 MG tablet, Take 25 mg by mouth daily., Disp: , Rfl:  .  torsemide (DEMADEX) 20 MG tablet, Take 2 tablets by mouth daily., Disp: , Rfl:  .  umeclidinium-vilanterol (ANORO ELLIPTA) 62.5-25 MCG/INH AEPB, Inhale 1 puff into the lungs daily., Disp: , Rfl:  .  warfarin (COUMADIN) 3 MG tablet, Take 3 mg by mouth 3 (three) times a week., Disp: , Rfl:  .  warfarin (COUMADIN) 4 MG tablet, Take 4 mg by mouth 4 (four) times a week., Disp: , Rfl:  .  warfarin (COUMADIN) 5 MG tablet, Take 5 mg by mouth as directed. Taking 5  mg M, W, F and 4 mg T, Th, Sat, Sun., Disp: , Rfl:   Past Medical History: Past Medical History:  Diagnosis Date  . Arthritis   . Chronic back pain   . Diabetes mellitus without complication ([place])   . Hypertension   . Neuropathy     Tobacco Use: Social History   Tobacco Use  Smoking Status Former Smoker  . Packs/day: 1.00  . Years: 45.00  . Pack years: 45.00  . Types: Cigarettes  . Last attempt to quit: 05/28/2005  . Years since quitting: 12.5  Smokeless Tobacco Never Used    Labs: Recent Review Flowsheet Data    Labs for ITP Cardiac and Pulmonary Rehab Latest Ref Rng & Units 08/08/2012 04/28/2013 11/05/2013 05/30/2014 06/02/2014   Cholestrol 0 - 200 mg/dL 105 - 127 64 -   LDLCALC 0 - 100 mg/dL 37 - 74 27 -   HDL 40 - 60 mg/dL 33(L) - 33(L) 15(L) -   Trlycerides 0 - 200  mg/dL 173 - 99 108 -   Hemoglobin A1c 4.2 - 6.3 % - 5.9 - - 5.3       Pulmonary Assessment Scores: Pulmonary Assessment Scores    Row Name 09/30/17 1428         ADL UCSD   ADL Phase  Entry     SOB Score total  73     Rest  1     Walk  2     Stairs  5     Bath  2     Dress  3     Shop  5       CAT Score   CAT Score  22       mMRC Score   mMRC Score  3        Pulmonary Function Assessment: Pulmonary Function Assessment - 09/30/17 1429      Initial Spirometry Results   FVC%  54 %    FEV1%  29 %    FEV1/FVC Ratio  39.67    Comments  test done on 08/28/16      Post Bronchodilator Spirometry Results   FVC%  64 %    FEV1%  37 %    FEV1/FVC Ratio  38.57    Comments  test done on 08/28/16      Breath   Shortness of Breath  Yes;Limiting activity;Fear of Shortness of Breath       Exercise Target Goals:    Exercise Program Goal: Individual exercise prescription set using results from initial 6 min walk test and THRR while considering  patient's activity barriers and safety.    Exercise Prescription Goal: Initial exercise prescription builds to 30-45 minutes a day of aerobic activity, 2-3 days per week.  Home exercise guidelines will be given to patient during program as part of exercise prescription that the participant will acknowledge.  Activity Barriers & Risk Stratification: Activity Barriers & Cardiac Risk Stratification - 09/30/17 1555      Activity Barriers & Cardiac Risk Stratification   Activity Barriers  Deconditioning;Muscular Weakness;Shortness of Breath;Decreased Ventricular Function;Balance Concerns;Back Problems chronic back pain       6 Minute Walk: 6 Minute Walk    Row Name 09/30/17 1550         6 Minute Walk   Phase  Initial     Distance  625 feet     Walk Time  3.9 minutes     # of Rest Breaks  4 15  sec, 44 sec, 49 sec, 20 sec     MPH  1.82     METS  2     RPE  17     Perceived Dyspnea   3     VO2 Peak  6.99     Symptoms  Yes  (comment)     Comments  SOB, back pain 8/10     Resting HR  78 bpm     Resting BP  - 80 dopplar     Resting Oxygen Saturation   96 %     Exercise Oxygen Saturation  during 6 min walk  83 %     Max Ex. HR  121 bpm     Max Ex. BP  - 112 dopplar     2 Minute Post BP  - 114 dopplar, rck 106       Interval HR   4 Minute HR  117     5 Minute HR  121     6 Minute HR  117     2 Minute Post HR  103     Interval Heart Rate?  Yes       Interval Oxygen   Interval Oxygen?  Yes     Baseline Oxygen Saturation %  96 %     1 Minute Liters of Oxygen  4 L pulsed     2 Minute Liters of Oxygen  4 L     3 Minute Liters of Oxygen  4 L     4 Minute Oxygen Saturation %  86 %     4 Minute Liters of Oxygen  5 L pt increased himself     5 Minute Oxygen Saturation %  83 %     5 Minute Liters of Oxygen  5 L     6 Minute Oxygen Saturation %  89 % 85% once seated     6 Minute Liters of Oxygen  5 L     2 Minute Post Oxygen Saturation %  90 %     2 Minute Post Liters of Oxygen  4 L       Oxygen Initial Assessment: Oxygen Initial Assessment - 09/30/17 1436      Home Oxygen   Home Oxygen Device  Home Concentrator;Portable Concentrator;E-Tanks    Sleep Oxygen Prescription  Continuous;CPAP    Liters per minute  2    Home Exercise Oxygen Prescription  Continuous    Liters per minute  2    Home at Rest Exercise Oxygen Prescription  Continuous    Liters per minute  2    Compliance with Home Oxygen Use  Yes      Initial 6 min Walk   Oxygen Used  Portable Concentrator;Pulsed    Liters per minute  3      Program Oxygen Prescription   Program Oxygen Prescription  Continuous;E-Tanks    Liters per minute  3      Intervention   Short Term Goals  To learn and exhibit compliance with exercise, home and travel O2 prescription;To learn and understand importance of maintaining oxygen saturations>88%;To learn and demonstrate proper use of respiratory medications;To learn and demonstrate proper pursed lip breathing  techniques or other breathing techniques.;To learn and understand importance of monitoring SPO2 with pulse oximeter and demonstrate accurate use of the pulse oximeter.    Long  Term Goals  Exhibits compliance with exercise, home and travel O2 prescription;Verbalizes importance of monitoring SPO2 with pulse oximeter and return demonstration;Maintenance  of O2 saturations>88%;Exhibits proper breathing techniques, such as pursed lip breathing or other method taught during program session;Compliance with respiratory medication;Demonstrates proper use of MDI's       Oxygen Re-Evaluation: Oxygen Re-Evaluation    Row Name 10/04/17 1048 11/06/17 1028           Program Oxygen Prescription   Program Oxygen Prescription  -  Continuous;E-Tanks      Liters per minute  -  3        Home Oxygen   Home Oxygen Device  -  Home Concentrator;Portable Concentrator;E-Tanks      Sleep Oxygen Prescription  -  Continuous;CPAP      Liters per minute  -  2      Home Exercise Oxygen Prescription  -  Continuous      Liters per minute  -  2      Home at Rest Exercise Oxygen Prescription  -  Continuous      Liters per minute  -  2      Compliance with Home Oxygen Use  -  Yes        Goals/Expected Outcomes   Short Term Goals  To learn and exhibit compliance with exercise, home and travel O2 prescription;To learn and understand importance of maintaining oxygen saturations>88%;To learn and demonstrate proper use of respiratory medications;To learn and demonstrate proper pursed lip breathing techniques or other breathing techniques.;To learn and understand importance of monitoring SPO2 with pulse oximeter and demonstrate accurate use of the pulse oximeter.  To learn and exhibit compliance with exercise, home and travel O2 prescription;To learn and understand importance of maintaining oxygen saturations>88%;To learn and demonstrate proper use of respiratory medications;To learn and demonstrate proper pursed lip breathing  techniques or other breathing techniques.;To learn and understand importance of monitoring SPO2 with pulse oximeter and demonstrate accurate use of the pulse oximeter.      Long  Term Goals  Exhibits compliance with exercise, home and travel O2 prescription;Verbalizes importance of monitoring SPO2 with pulse oximeter and return demonstration;Maintenance of O2 saturations>88%;Exhibits proper breathing techniques, such as pursed lip breathing or other method taught during program session;Compliance with respiratory medication;Demonstrates proper use of MDI's  Exhibits compliance with exercise, home and travel O2 prescription;Verbalizes importance of monitoring SPO2 with pulse oximeter and return demonstration;Maintenance of O2 saturations>88%;Exhibits proper breathing techniques, such as pursed lip breathing or other method taught during program session;Compliance with respiratory medication;Demonstrates proper use of MDI's      Comments  Reviewed PLB technique with pt.  Talked about how it work and it's important to maintaining his exercise saturations.  William Bray is taking Pulmicort nebulizer and has an albuterol inhaler. He states when he is resting he uses 1.5-2 liters of oxygen. He checks his oxygen at home and he is 34-96 percent. He wants to be off oxygen all together. We will try to decrease his oxygen by 1 liter on some exercise machines, check his oxygen and start weaning.      Goals/Expected Outcomes  Short: Become more profiecient at using PLB.   Long: Become independent at using PLB.  Short: decrease oxygen by 1 liter while exercising. Long: Use no oxygen while at rest.         Oxygen Discharge (Final Oxygen Re-Evaluation): Oxygen Re-Evaluation - 11/06/17 1028      Program Oxygen Prescription   Program Oxygen Prescription  Continuous;E-Tanks    Liters per minute  3      Home Oxygen   Home Oxygen Device  Home Concentrator;Portable Concentrator;E-Tanks    Sleep Oxygen Prescription   Continuous;CPAP    Liters per minute  2    Home Exercise Oxygen Prescription  Continuous    Liters per minute  2    Home at Rest Exercise Oxygen Prescription  Continuous    Liters per minute  2    Compliance with Home Oxygen Use  Yes      Goals/Expected Outcomes   Short Term Goals  To learn and exhibit compliance with exercise, home and travel O2 prescription;To learn and understand importance of maintaining oxygen saturations>88%;To learn and demonstrate proper use of respiratory medications;To learn and demonstrate proper pursed lip breathing techniques or other breathing techniques.;To learn and understand importance of monitoring SPO2 with pulse oximeter and demonstrate accurate use of the pulse oximeter.    Long  Term Goals  Exhibits compliance with exercise, home and travel O2 prescription;Verbalizes importance of monitoring SPO2 with pulse oximeter and return demonstration;Maintenance of O2 saturations>88%;Exhibits proper breathing techniques, such as pursed lip breathing or other method taught during program session;Compliance with respiratory medication;Demonstrates proper use of MDI's    Comments  William Bray is taking Pulmicort nebulizer and has an albuterol inhaler. He states when he is resting he uses 1.5-2 liters of oxygen. He checks his oxygen at home and he is 34-96 percent. He wants to be off oxygen all together. We will try to decrease his oxygen by 1 liter on some exercise machines, check his oxygen and start weaning.    Goals/Expected Outcomes  Short: decrease oxygen by 1 liter while exercising. Long: Use no oxygen while at rest.       Initial Exercise Prescription: Initial Exercise Prescription - 09/30/17 1500      Date of Initial Exercise RX and Referring Provider   Date  09/30/17    Referring Provider  [name] MD      Oxygen   Oxygen  Continuous    Liters  4      Treadmill   MPH  1.3    Grade  0    Minutes  15    METs  2      NuStep   Level  2    SPM  80     Minutes  15    METs  2      REL-XR   Level  1    Speed  50    Minutes  15    METs  2      Prescription Details   Frequency (times per week)  3    Duration  Progress to 45 minutes of aerobic exercise without signs/symptoms of physical distress      Intensity   THRR 40-80% of Max Heartrate  108-138    Ratings of Perceived Exertion  11-13    Perceived Dyspnea  0-4      Progression   Progression  Continue to progress workloads to maintain intensity without signs/symptoms of physical distress.      Resistance Training   Training Prescription  Yes    Weight  4 lbs    Reps  10-15       Perform Capillary Blood Glucose checks as needed.  Exercise Prescription Changes: Exercise Prescription Changes    Row Name 09/30/17 1500 10/15/17 1200 10/30/17 1400 11/06/17 1100 11/13/17 1500     Response to Exercise   Blood Pressure (Admit)  - 80 dopplar  - 84  - 88  - 88  - 90   Blood  Pressure (Exercise)  - 112 dopplar  -  -  -  -   Blood Pressure (Exit)  - 114 dopplar, 106 dopplar recheck  - 84  - 82  - 82  - 72   Heart Rate (Admit)  78 bpm  84 bpm  96 bpm  96 bpm  80 bpm   Heart Rate (Exercise)  121 bpm  101 bpm  99 bpm  99 bpm  118 bpm   Heart Rate (Exit)  90 bpm  -  88 bpm  88 bpm  87 bpm   Oxygen Saturation (Admit)  96 %  89 %  98 %  98 %  96 %   Oxygen Saturation (Exercise)  83 %  97 %  96 %  96 %  96 %   Oxygen Saturation (Exit)  90 %  97 %  96 %  96 %  -   Rating of Perceived Exertion (Exercise)  17  12  13  13  14    Perceived Dyspnea (Exercise)  3  2  3  3  3    Symptoms  back pain 8/10, SOB  -  -  -  -   Comments  walk test results  -  -  -  -   Duration  -  Progress to 45 minutes of aerobic exercise without signs/symptoms of physical distress  Progress to 45 minutes of aerobic exercise without signs/symptoms of physical distress  Progress to 45 minutes of aerobic exercise without signs/symptoms of physical distress  Progress to 45 minutes of aerobic exercise without  signs/symptoms of physical distress   Intensity  -  THRR unchanged  THRR unchanged  THRR unchanged  THRR unchanged     Progression   Progression  -  Continue to progress workloads to maintain intensity without signs/symptoms of physical distress.  Continue to progress workloads to maintain intensity without signs/symptoms of physical distress.  Continue to progress workloads to maintain intensity without signs/symptoms of physical distress.  Continue to progress workloads to maintain intensity without signs/symptoms of physical distress.     Resistance Training   Training Prescription  -  Yes  Yes  Yes  Yes   Weight  -  3 lb  3 lb  3 lb  3 lb   Reps  -  10-15  10-15  10-15  10-15     Oxygen   Oxygen  -  Continuous  Continuous  Continuous  Continuous   Liters  -  3  3  3  3      Treadmill   MPH  -  -  1.2  1.2  1.3   Grade  -  -  0  0  0   Minutes  -  -  15  15  15    METs  -  -  2  2  2      NuStep   Level  -  1  -  -  3   SPM  -  80  -  -  80   Minutes  -  15  -  -  15   METs  -  -  -  -  1.6     REL-XR   Level  -  -  1  1  3    Speed  -  -  50  50  50   Minutes  -  -  15  15  15    METs  -  -  -  -  1.7     Home Exercise Plan   Plans to continue exercise at  -  -  -  Home (comment) has 3 and 5 lb weights  Home (comment) has 3 and 5 lb weights   Frequency  -  -  -  Add 1 additional day to program exercise sessions.  Add 1 additional day to program exercise sessions.   Initial Home Exercises Provided  -  -  -  11/06/17  11/06/17      Exercise Comments: Exercise Comments    Row Name 10/04/17 1047           Exercise Comments   First full day of exercise!  Patient was oriented to gym and equipment including functions, settings, policies, and procedures.  Patient's individual exercise prescription and treatment plan were reviewed.  All starting workloads were established based on the results of the 6 minute walk test done at initial orientation visit.  The plan for exercise  progression was also introduced and progression will be customized based on patient's performance and goals          Exercise Goals and Review: Exercise Goals    Row Name 09/30/17 1600             Exercise Goals   Increase Physical Activity  Yes       Intervention  Provide advice, education, support and counseling about physical activity/exercise needs.;Develop an individualized exercise prescription for aerobic and resistive training based on initial evaluation findings, risk stratification, comorbidities and participant's personal goals.       Expected Outcomes  Short Term: Attend rehab on a regular basis to increase amount of physical activity.;Long Term: Add in home exercise to make exercise part of routine and to increase amount of physical activity.;Long Term: Exercising regularly at least 3-5 days a week.       Increase Strength and Stamina  Yes       Intervention  Provide advice, education, support and counseling about physical activity/exercise needs.;Develop an individualized exercise prescription for aerobic and resistive training based on initial evaluation findings, risk stratification, comorbidities and participant's personal goals.       Expected Outcomes  Short Term: Increase workloads from initial exercise prescription for resistance, speed, and METs.;Short Term: Perform resistance training exercises routinely during rehab and add in resistance training at home;Long Term: Improve cardiorespiratory fitness, muscular endurance and strength as measured by increased METs and functional capacity (6MWT)       Able to understand and use rate of perceived exertion (RPE) scale  Yes       Intervention  Provide education and explanation on how to use RPE scale       Expected Outcomes  Long Term:  Able to use RPE to guide intensity level when exercising independently;Short Term: Able to use RPE daily in rehab to express subjective intensity level       Able to understand and use Dyspnea  scale  Yes       Intervention  Provide education and explanation on how to use Dyspnea scale       Expected Outcomes  Long Term: Able to use Dyspnea scale to guide intensity level when exercising independently;Short Term: Able to use Dyspnea scale daily in rehab to express subjective sense of shortness of breath during exertion       Knowledge and understanding of Target Heart Rate Range (THRR)  Yes       Intervention  Provide education and explanation of THRR  including how the numbers were predicted and where they are located for reference       Expected Outcomes  Short Term: Able to state/look up THRR;Short Term: Able to use daily as guideline for intensity in rehab;Long Term: Able to use THRR to govern intensity when exercising independently       Able to check pulse independently  Yes       Intervention  Provide education and demonstration on how to check pulse in carotid and radial arteries.;Review the importance of being able to check your own pulse for safety during independent exercise       Expected Outcomes  Short Term: Able to explain why pulse checking is important during independent exercise;Long Term: Able to check pulse independently and accurately       Understanding of Exercise Prescription  Yes       Intervention  Provide education, explanation, and written materials on patient's individual exercise prescription       Expected Outcomes  Short Term: Able to explain program exercise prescription;Long Term: Able to explain home exercise prescription to exercise independently          Exercise Goals Re-Evaluation : Exercise Goals Re-Evaluation    William Bray Name 10/04/17 1048 10/15/17 1252 10/30/17 1449 11/06/17 1150 11/26/17 1542     Exercise Goal Re-Evaluation   Exercise Goals Review  Increase Physical Activity;Increase Strength and Stamina;Able to understand and use rate of perceived exertion (RPE) scale;Able to understand and use Dyspnea scale;Knowledge and understanding of Target  Heart Rate Range (THRR);Understanding of Exercise Prescription  Increase Physical Activity;Able to understand and use rate of perceived exertion (RPE) scale;Increase Strength and Stamina;Able to understand and use Dyspnea scale  Increase Physical Activity;Increase Strength and Stamina;Able to understand and use rate of perceived exertion (RPE) scale;Able to understand and use Dyspnea scale  Increase Physical Activity;Increase Strength and Stamina;Able to understand and use Dyspnea scale;Able to understand and use rate of perceived exertion (RPE) scale;Knowledge and understanding of Target Heart Rate Range (THRR);Able to check pulse independently  -   Comments  Reviewed RPE scale, THR and program prescription with pt today.  Pt voiced understanding and was given a copy of goals to take home.   William Bray is just starting back to exercise.  He is tolerating exercise well and BG has been within acceptable range.    William Bray does the best he can day to day with exercise.  He has missed some sessions due to back pain.  Staff will monitor progress.  Reviewed home exercise - William Bray has had some back pain so we reviewed stretches as well as the strength work to Bray seated.  Pt voiced understanding.  he has been doing weights at home already.  Out since last review   Expected Outcomes  Short: Use RPE daily to regulate intensity.  Long: Follow program prescription in THR.  Short - pt will attend class 3 days per week Long - Pt will improve MET level   Short - William Bray will be able to attend regularly Long - Brok will maintain fitness level  Short - Dajion will Bray strength work on days not at [employer] will maintain exercise on his own  -      Discharge Exercise Prescription (Final Exercise Prescription Changes): Exercise Prescription Changes - 11/13/17 1500      Response to Exercise   Blood Pressure (Admit)  -- 90    Blood Pressure (Exit)  -- 72    Heart Rate (Admit)  80 bpm    Heart Rate (Exercise)  118 bpm    Heart Rate  (Exit)  87 bpm    Oxygen Saturation (Admit)  96 %    Oxygen Saturation (Exercise)  96 %    Rating of Perceived Exertion (Exercise)  14    Perceived Dyspnea (Exercise)  3    Duration  Progress to 45 minutes of aerobic exercise without signs/symptoms of physical distress    Intensity  THRR unchanged      Progression   Progression  Continue to progress workloads to maintain intensity without signs/symptoms of physical distress.      Resistance Training   Training Prescription  Yes    Weight  3 lb    Reps  10-15      Oxygen   Oxygen  Continuous    Liters  3      Treadmill   MPH  1.3    Grade  0    Minutes  15    METs  2      NuStep   Level  3    SPM  80    Minutes  15    METs  1.6      REL-XR   Level  3    Speed  50    Minutes  15    METs  1.7      Home Exercise Plan   Plans to continue exercise at  Home (comment) has 3 and 5 lb weights    Frequency  Add 1 additional day to program exercise sessions.    Initial Home Exercises Provided  11/06/17       Nutrition:  Target Goals: Understanding of nutrition guidelines, daily intake of sodium <1578m, cholesterol <2016m calories 30% from fat and 7% or less from saturated fats, daily to have 5 or more servings of fruits and vegetables.  Biometrics: Pre Biometrics - 09/30/17 1601      Pre Biometrics   Height  5' 8.7" (1.745 m)    Weight  181 lb 4.8 oz (82.2 kg)    Waist Circumference  39 inches    Hip Circumference  38.5 inches    Waist to Hip Ratio  1.01 %    BMI (Calculated)  27.01    Single Leg Stand  5.15 seconds        Nutrition Therapy Plan and Nutrition Goals: Nutrition Therapy & Goals - 10/16/17 1021      Nutrition Therapy   Diet  DM / TLC    Drug/Food Interactions  Coumadin/Vit K    Protein (specify units)  12oz    Fiber  30 grams    Whole Grain Foods  3 servings    Saturated Fats  15 max. grams    Fruits and Vegetables  5 servings/day 8 ideal    Sodium  2000 grams      Personal Nutrition  Goals   Nutrition Goal  Eat on a consistent schedule daily in order to stimulate appetite / hunger    Personal Goal #2  If you continue to find yourself without an appetite, consuming a diet higher in fat can help you to consume more calories with a smaller volume of food    Comments  He would like to maintain his CBW but does not always feel hungry. He has had dietary education off and on since [place] and counsel regarding individualized specific dietary modifications  aiming towards targeted core components such as weight, hypertension, lipid management, diabetes, heart failure and other comorbidities.    Expected Outcomes  Short Term Goal: Understand basic principles of dietary content, such as calories, fat, sodium, cholesterol and nutrients.;Short Term Goal: A plan has been developed with personal nutrition goals set during dietitian appointment.;Long Term Goal: Adherence to prescribed nutrition plan.       Nutrition Assessments: Nutrition Assessments - 09/30/17 1427      MEDFICTS Scores   Pre Score  30       Nutrition Goals Re-Evaluation: Nutrition Goals Re-Evaluation    Row Name 10/16/17 1023 10/16/17 1024 11/06/17 1035         Goals   Current Weight  -  -  189 lb (85.7 kg)     Nutrition Goal  If you continue to find yourself without an appetite, consuming a diet higher in fat can help you to consume more calories with a smaller volume of food  -  William Bray wants to weigh 180 pounds without his LVAD. Bray not over eat.     Comment  He c/o decreased appetite  -  William Bray states he is eating too much food. He met with the dietician but states he eats too many vegatables. One doctor tells him that he needs to lose weight and one that says he needs to gain weight.     Expected Outcome  He will eat foods high in fat around the same times each day in order to maximize his caloric intake  -  Short: Eat healthy food and not over eat. Long:  Obtain a healthy weight.       Personal Goal #2 Re-Evaluation   Personal Goal #2  -  Eat on a consistent schedule daily in order to stimulate hunger/ appetite  -        Nutrition Goals Discharge (Final Nutrition Goals Re-Evaluation): Nutrition Goals Re-Evaluation - 11/06/17 1035      Goals   Current Weight  189 lb (85.7 kg)    Nutrition Goal  William Bray wants to weigh 180 pounds without his LVAD. Bray not over eat.    Comment  Kayan states he is eating too much food. He met with the dietician but states he eats too many vegatables. One doctor tells him that he needs to lose weight and one that says he needs to gain weight.    Expected Outcome  Short: Eat healthy food and not over eat. Long: Obtain a healthy weight.       Psychosocial: Target Goals: Acknowledge presence or absence of significant depression and/or stress, maximize coping skills, provide positive support system. Participant is able to verbalize types and ability to use techniques and skills needed for reducing stress and depression.   Initial Review & Psychosocial Screening: Initial Psych Review & Screening - 09/30/17 1425      Initial Review   Current issues with  History of Depression;Current Psychotropic Meds;Current Stress Concerns    Source of Stress Concerns  Chronic Illness;Unable to perform yard/household activities    Comments  He feels like his breathing is getting worse and wants to get better      [place]?  Yes    Comments  Mr Costabile has great support from his wife, daughter, and son.       Barriers   Psychosocial barriers to participate in program  The patient should benefit from training in stress management and relaxation.  Screening Interventions   Interventions  Encouraged to exercise;Provide feedback about the scores to participant;Program counselor consult;To provide support and resources with identified psychosocial needs    Expected Outcomes  Short Term goal: Utilizing  psychosocial counselor, staff and physician to assist with identification of specific Stressors or current issues interfering with healing process. Setting desired goal for each stressor or current issue identified.;Long Term Goal: Stressors or current issues are controlled or eliminated.;Short Term goal: Identification and review with participant of any Quality of Life or Depression concerns found by scoring the questionnaire.;Long Term goal: The participant improves quality of Life and PHQ9 Scores as seen by post scores and/or verbalization of changes       Quality of Life Scores:  Scores of 19 and below usually indicate a poorer quality of life in these areas.  A difference of  2-3 points is a clinically meaningful difference.  A difference of 2-3 points in the total score of the Quality of Life Index has been associated with significant improvement in overall quality of life, self-image, physical symptoms, and general health in studies assessing change in quality of life.  PHQ-9: Recent Review Flowsheet Data    Depression screen [facility] 2/9 12/11/2017 11/20/2017 10/22/2017 09/30/2017 08/28/2016   Decreased Interest 0 0 2 2 1    Down, Depressed, Hopeless 1 0 2 2 1    PHQ - 2 Score 1 0 4 4 2    Altered sleeping 0 - 2 2 1    Tired, decreased energy 1 - 2 2 1    Change in appetite 1 - 2 2 1    Feeling bad or failure about yourself  0 - 2 1 1    Trouble concentrating 0 - 2 1 1    Moving slowly or fidgety/restless 0 - 2 1 1    Suicidal thoughts 0 - 0 0 0   PHQ-9 Score 3 - 16 13 8    Difficult doing work/chores Not difficult at all - Somewhat difficult Very difficult Not difficult at all     Interpretation of Total Score  Total Score Depression Severity:  1-4 = Minimal depression, 5-9 = Mild depression, 10-14 = Moderate depression, 15-19 = Moderately severe depression, 20-27 = Severe depression   Psychosocial Evaluation and Intervention: Psychosocial Evaluation - 10/16/17 1023      Psychosocial Evaluation &  Interventions   Comments  Artist has returned to Pulmonary Rehab after being in the hospital for several months late December/early January.  He is a 67 year old who has multiple health issues with CHF and COPD primarily along with severe back problems.  William Bray has a strong support system with a spouse; sisters and a sister-in-law; a son and a daughter locally.  He reports not sleeping great with maybe 5-6 hours of sleep per night.  His appetite is "fine" currently.  William Bray reports a history of anxiety with some symptoms present several times each day.  He is on medication for this that helps somewhat.  Kire reports being in a positive mood most of the time and his health is his primary stressor.  He has goals to get stronger and breathe better overall.  Staff will follow with William Bray     Expected Outcomes  Short:  William Bray will exercise to reduce his anxiety symptoms.   Long:  Vane will exercise consistently to recover his stamina and strength and breathe better overall.      Continue Psychosocial Services   Follow up required by staff       Psychosocial Re-Evaluation: Psychosocial  Re-Evaluation    Row Name 10/14/17 1341 11/06/17 1046           Psychosocial Re-Evaluation   Current issues with  -  Current Sleep Concerns;Current Stress Concerns;Current Depression;History of Depression      Comments  Dequavion has just joined the prgram after a year put. He had been in the Maintenance classes and his MD wanted him in Pulmonary Rehab. He is struggling with increased SOB today, his weight is up 9 pounds over a week.  He called his LVAD team and reviewed this info with them. He will keep them informed of how his weight changes.  Nayquan recently was placed on Prednisone, and the staff did explain how it can make him retain fluid, it was emphasized that he should be weiging himself daily and reporting 2 0r more weight gain to the LVAD team. He is upset that he is more SOB today and wants to feel better.  He was told to gain  some weight and thought the weight gain was form eating.  Will continue to followup with William Bray and encourage him to closely watch his weight., as the more contol of keeping the excess fluid will help with the SOB and his ability to keep up the exercise, which wil help his stamina increase and overall feeling better-whichis his ultimate goal.    William Bray what he wants to Bray, he does not like being handicapped. Ever since his health went down hill he has been more depressed. He states he is depressed about everything.       Expected Outcomes  -  Short: Attened [name] regularly to decrease stress. Long: graduate [name] to keep stress at a minimum.      Interventions  Therapist referral;Encouraged to attend Pulmonary Rehabilitation for the exercise;Relaxation education  Therapist referral;Encouraged to attend Pulmonary Rehabilitation for the exercise;Relaxation education      Continue Psychosocial Services   Follow up required by counselor  Follow up required by staff         Psychosocial Discharge (Final Psychosocial Re-Evaluation): Psychosocial Re-Evaluation - 11/06/17 1046      Psychosocial Re-Evaluation   Current issues with  Current Sleep Concerns;Current Stress Concerns;Current Depression;History of Depression    Comments  William Bray cant Bray what he wants to Bray, he does not like being handicapped. Ever since his health went down hill he has been more depressed. He states he is depressed about everything.     Expected Outcomes  Short: Attened [name] regularly to decrease stress. Long: graduate [name] to keep stress at a minimum.    Interventions  Therapist referral;Encouraged to attend Pulmonary Rehabilitation for the exercise;Relaxation education    Continue Psychosocial Services   Follow up required by staff       Education: Education Goals: Education classes will be provided on a weekly basis, covering required topics. Participant will state understanding/return demonstration of  topics presented.  Learning Barriers/Preferences: Learning Barriers/Preferences - 09/30/17 1436      Learning Barriers/Preferences   Learning Barriers  None    Learning Preferences  None       Education Topics:  Initial Evaluation Education: - Verbal, written and demonstration of respiratory meds, oximetry and breathing techniques. Instruction on use of nebulizers and MDIs and importance of monitoring MDI activations.   Pulmonary Rehab from 10/23/2017 in [facility] Cardiac and Pulmonary Rehab  Date  09/30/17  Educator  [facility]  Instruction Review Code  1- Verbalizes Understanding      General Nutrition Guidelines/Fats  and Fiber: -Group instruction provided by verbal, written material, models and posters to present the general guidelines for heart healthy nutrition. Gives an explanation and review of dietary fats and fiber.   Pulmonary Rehab from 10/23/2017 in [facility] Cardiac and Pulmonary Rehab  Date  10/14/17  Educator  CR  Instruction Review Code  1- Verbalizes Understanding      Controlling Sodium/Reading Food Labels: -Group verbal and written material supporting the discussion of sodium use in heart healthy nutrition. Review and explanation with models, verbal and written materials for utilization of the food label.   Exercise Physiology & General Exercise Guidelines: - Group verbal and written instruction with models to review the exercise physiology of the cardiovascular system and associated critical values. Provides general exercise guidelines with specific guidelines to those with heart or lung disease.    Aerobic Exercise & Resistance Training: - Gives group verbal and written instruction on the various components of exercise. Focuses on aerobic and resistive training programs and the benefits of this training and how to safely progress through these programs.   Cardiac Rehab from 03/21/2016 in [place] Cardiac and Pulmonary Rehab  Date  02/29/16  Educator  [facility]  Instruction Review  Code (retired)  2- Statistician, Balance, Mind/Body Relaxation: Provides group verbal/written instruction on the benefits of flexibility and balance training, including mind/body exercise modes such as yoga, pilates and tai chi.  Demonstration and skill practice provided.   Cardiac Rehab from 03/21/2016 in [facility] Cardiac and Pulmonary Rehab  Date  01/09/16  Educator  [facility]  Instruction Review Code (retired)  2- meets goals/outcomes      Stress and Anxiety: - Provides group verbal and written instruction about the health risks of elevated stress and causes of high stress.  Discuss the correlation between heart/lung disease and anxiety and treatment options. Review healthy ways to manage with stress and anxiety.   Pulmonary Rehab from 10/23/2017 in [facility] Cardiac and Pulmonary Rehab  Date  10/23/17  Educator  [facility]  Instruction Review Code  1- Verbalizes Understanding      Depression: - Provides group verbal and written instruction on the correlation between heart/lung disease and depressed mood, treatment options, and the stigmas associated with seeking treatment.   Exercise & Equipment Safety: - Individual verbal instruction and demonstration of equipment use and safety with use of the equipment.   Pulmonary Rehab from 10/23/2017 in [facility] Cardiac and Pulmonary Rehab  Date  09/30/17  Educator  [facility]  Instruction Review Code  1- Verbalizes Understanding      Infection Prevention: - Provides verbal and written material to individual with discussion of infection control including proper hand washing and proper equipment cleaning during exercise session.   Pulmonary Rehab from 10/23/2017 in [facility] Cardiac and Pulmonary Rehab  Date  09/30/17  Educator  [facility]  Instruction Review Code  1- Verbalizes Understanding      Falls Prevention: - Provides verbal and written material to individual with discussion of falls prevention and safety.   Pulmonary Rehab from 10/23/2017 in [facility]  Cardiac and Pulmonary Rehab  Date  09/30/17  Educator  [facility]  Instruction Review Code  1- Verbalizes Understanding      Diabetes: - Individual verbal and written instruction to review signs/symptoms of diabetes, desired ranges of glucose level fasting, after meals and with exercise. Advice that pre and post exercise glucose checks will be done for 3 sessions at entry of program.   Cardiac Rehab from 03/21/2016 in  [place] Cardiac and Pulmonary Rehab  Date  01/02/16  Educator  D. Joya Gaskins, RN  Instruction Review Code (retired)  2- meets goals/outcomes      Chronic Lung Diseases: - Group verbal and written instruction to review updates, respiratory medications, advancements in procedures and treatments. Discuss use of supplemental oxygen including available portable oxygen systems, continuous and intermittent flow rates, concentrators, personal use and safety guidelines. Review proper use of inhaler and spacers. Provide informative websites for self-education.    Energy Conservation: - Provide group verbal and written instruction for methods to conserve energy, plan and organize activities. Instruct on pacing techniques, use of adaptive equipment and posture/positioning to relieve shortness of breath.   Pulmonary Rehab from 10/23/2017 in [facility] Cardiac and Pulmonary Rehab  Date  10/16/17  Educator  [facility]  Instruction Review Code  1- Verbalizes Understanding      Triggers and Exacerbations: - Group verbal and written instruction to review types of environmental triggers and ways to prevent exacerbations. Discuss weather changes, air quality and the benefits of nasal washing. Review warning signs and symptoms to help prevent infections. Discuss techniques for effective airway clearance, coughing, and vibrations.   AED/CPR: - Group verbal and written instruction with the use of models to demonstrate the basic use of the AED with the basic ABC's of resuscitation.   Pulmonary Rehab from 10/23/2017 in  [facility] Cardiac and Pulmonary Rehab  Date  10/18/17  Educator  [place]  Instruction Review Code  1- Verbalizes Understanding      Anatomy and Physiology of the Lungs: - Group verbal and written instruction with the use of models to provide basic lung anatomy and physiology related to function, structure and complications of lung disease.   Anatomy & Physiology of the Heart: - Group verbal and written instruction and models provide basic cardiac anatomy and physiology, with the coronary electrical and arterial systems. Review of Valvular disease and Heart Failure   Cardiac Rehab from 03/21/2016 in [facility] Cardiac and Pulmonary Rehab  Date  01/16/16  Educator  SB  Instruction Review Code (retired)  2- meets goals/outcomes      Cardiac Medications: - Group verbal and written instruction to review commonly prescribed medications for heart disease. Reviews the medication, class of the drug, and side effects.   Pulmonary Rehab from 10/23/2017 in [facility] Cardiac and Pulmonary Rehab  Date  10/04/17  Educator  [facility]  Instruction Review Code  1- Verbalizes Understanding      Know Your Numbers and Risk Factors: -Group verbal and written instruction about important numbers in your health.  Discussion of what are risk factors and how they play a role in the disease process.  Review of Cholesterol, Blood Pressure, Diabetes, and BMI and the role they play in your overall health.   Sleep Hygiene: -Provides group verbal and written instruction about how sleep can affect your health.  Define sleep hygiene, discuss sleep cycles and impact of sleep habits. Review good sleep hygiene tips.    Other: -Provides group and verbal instruction on various topics (see comments)    Knowledge Questionnaire Score: Knowledge Questionnaire Score - 09/30/17 1436      Knowledge Questionnaire Score   Pre Score  15/18 reviewed with patient        Core Components/Risk Factors/Patient Goals at Admission: Personal Goals and  Risk Factors at Admission - 09/30/17 1438      Core Components/Risk Factors/Patient Goals on Admission    Weight Management  Yes;Weight Maintenance;Weight Loss    Intervention  Weight Management: Develop a combined nutrition and exercise program designed to reach desired caloric intake, while maintaining appropriate intake of nutrient and fiber, sodium and fats, and appropriate energy expenditure required for the weight goal.;Weight Management: Provide education and appropriate resources to help participant work on and attain dietary goals.;Weight Management/Obesity: Establish reasonable short term and long term weight goals.    Admit Weight  181 lb 4.8 oz (82.2 kg)    Goal Weight: Short Term  170 lb (77.1 kg)    Goal Weight: Long Term  170 lb (77.1 kg)    Expected Outcomes  Short Term: Continue to assess and modify interventions until short term weight is achieved;Long Term: Adherence to nutrition and physical activity/exercise program aimed toward attainment of established weight goal;Weight Maintenance: Understanding of the daily nutrition guidelines, which includes 25-35% calories from fat, 7% or less cal from saturated fats, less than 221m cholesterol, less than 1.5gm of sodium, & 5 or more servings of fruits and vegetables daily;Weight Loss: Understanding of general recommendations for a balanced deficit meal plan, which promotes 1-2 lb weight loss per week and includes a negative energy balance of [phone] kcal/d;Understanding recommendations for meals to include 15-35% energy as protein, 25-35% energy from fat, 35-60% energy from carbohydrates, less than 2043mof dietary cholesterol, 20-35 gm of total fiber daily;Understanding of distribution of calorie intake throughout the day with the consumption of 4-5 meals/snacks    Improve shortness of breath with ADL's  Yes    Intervention  Provide education, individualized exercise plan and daily activity instruction to help decrease symptoms of SOB with  activities of daily living.    Expected Outcomes  Short Term: Improve cardiorespiratory fitness to achieve a reduction of symptoms when performing ADLs;Long Term: Be able to perform more ADLs without symptoms or delay the onset of symptoms    Diabetes  Yes    Intervention  Provide education about signs/symptoms and action to take for hypo/hyperglycemia.;Provide education about proper nutrition, including hydration, and aerobic/resistive exercise prescription along with prescribed medications to achieve blood glucose in normal ranges: Fasting glucose 65-99 mg/dL    Expected Outcomes  Short Term: Participant verbalizes understanding of the signs/symptoms and immediate care of hyper/hypoglycemia, proper foot care and importance of medication, aerobic/resistive exercise and nutrition plan for blood glucose control.;Long Term: Attainment of HbA1C < 7%.    Heart Failure  Yes    Intervention  Provide a combined exercise and nutrition program that is supplemented with education, support and counseling about heart failure. Directed toward relieving symptoms such as shortness of breath, decreased exercise tolerance, and extremity edema.    Expected Outcomes  Improve functional capacity of life;Short term: Attendance in program 2-3 days a week with increased exercise capacity. Reported lower sodium intake. Reported increased fruit and vegetable intake. Reports medication compliance.;Short term: Daily weights obtained and reported for increase. Utilizing diuretic protocols set by physician.;Long term: Adoption of self-care skills and reduction of barriers for early signs and symptoms recognition and intervention leading to self-care maintenance.    Hypertension  Yes    Intervention  Provide education on lifestyle modifcations including regular physical activity/exercise, weight management, moderate sodium restriction and increased consumption of fresh fruit, vegetables, and low fat dairy, alcohol moderation, and  smoking cessation.;Monitor prescription use compliance.    Expected Outcomes  Short Term: Continued assessment and intervention until BP is < 140/9039mG in hypertensive participants. < 130/79m13m in hypertensive participants with diabetes, heart failure or chronic kidney disease.;Long Term: Maintenance of blood  pressure at goal levels.       Core Components/Risk Factors/Patient Goals Review:  Goals and Risk Factor Review    Row Name 10/14/17 1350 11/06/17 1020           Core Components/Risk Factors/Patient Goals Review   Personal Goals Review  Weight Management/Obesity;Heart Failure;Improve shortness of breath with ADL's;Diabetes;Hypertension  Weight Management/Obesity;Heart Failure;Improve shortness of breath with ADL's;Diabetes;Hypertension      Review  William Bray has recently started the program. Today he was presenting with incresed SOB and weight gain.  After a call to his LVAD team , William Bray has a game plan with his diurectics to work on the fluid overload. He is taking all his meds and checking his blood sugar levels at home.   His AM fasting BS is 140-200 per William Bray.  HE will report this to his MD.    William Bray has been checking his blood sugar at home and has been in the 140s-150-s fasting. His weight goal is to be 180 pounds. He is able to take a shower by himself, dress himself and [place] house work without any issues. His blood pressure has been 70s-80s and stable. 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 His back and his artheritis have been giving him issues. He is trying to get into the pain clinic and had his first appointment last week or so.      Expected Outcomes  ST: William Bray will improve the SOB symptoms as he gains control of excess fluid. He will speak to his MD about his blood Glucose elvels and he will continue to attend the program to work on buliding his stamina and strength. LT William Bray will be able to know when to call his MD about his weight gain and will continue to keep other risk factors under control.    Short: work with the pain clinis to manage his back pain. Long: obtain treatment to help with his back and artheritis.         Core Components/Risk Factors/Patient Goals at Discharge (Final Review):  Goals and Risk Factor Review - 11/06/17 1020      Core Components/Risk Factors/Patient Goals Review   Personal Goals Review  Weight Management/Obesity;Heart Failure;Improve shortness of breath with ADL's;Diabetes;Hypertension    Review  William Bray has been checking his blood sugar at home and has been in the 140s-150-s fasting. His weight goal is to be 180 pounds. He is able to take a shower by himself, dress himself and [place] house work without any issues. His blood pressure has been 70s-80s and stable. 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 His back and his artheritis have been giving him issues. He is trying to get into the pain clinic and had his first appointment last week or so.    Expected Outcomes  Short: work with the pain clinis to manage his back pain. Long: obtain treatment to help with his back and artheritis.       ITP Comments: ITP Comments    Row Name 09/30/17 1400 10/14/17 1122 10/14/17 1339 10/18/17 1147 10/28/17 0812   ITP Comments  Medical Evaluation completed. Chart sent for review and changes to Dr. Emily Filbert Director of [place]. Diagnosis can be found in [facility] encounter 09/16/17  Patient arrived and had gained 9 lbs in 7 days and was short of breath. We was advised by staff not to exercise and to call his LVAD team at [facility]. He called his his team and they cleared him to exercise over the phone. He  did exercise at a very light intensity and tolerated it well.   Patient arrived and had gained 9 lbs in 7 days and was short of breath. We was advised by staff not to exercise and to call his LVAD team at [facility]. He called his his team and they cleared him to exercise over the phone. He did exercise at a very light intensity and tolerated it well.    Pt's BP was 64. Patient reported lightheadedness and  weakness. He reported that he usually takes lisinopril 74m daily at night. He recently refilled his lisinopril and realized that the dose was 283minstead of 1015mHe checked his medication list and noticed it is 58m1m he took 58mg53mt night but he is not sure when the dose was changed to 58mg.71mwas asked to call his PCP to verify the right dose. PCP verbalized that dose is 10mg n24m0mg. P51munk 2 glasses of water. Will continue to monitor patient's status.    30 day review completed. ITP sent to Dr. Mark MilEmily Filbertr of [facility] with ITP unless changes are made by physician   Row Name 11/25/17 0941 12/11/17 1149 12/18/17 1450 12/23/17 0835     ITP Comments  30 day review completed. ITP sent to Dr. Mark MilEmily Filbertr of [facility] with ITP unless changes are made by physician  Kacee hasDiandre attended since 11/09/17 due to a back injury.  He is in PT and plans to return to [facility].  [facility] has had a back injury. Patient hopes to return soon.  30 day review completed. ITP sent to Dr. Mark MilEmily Filbertr of [facility] with ITP unless changes are made by physician       Comments: 30 day review

## 2017-12-27 ENCOUNTER — Encounter: Payer: Medicare Other | Attending: Pulmonary Disease

## 2017-12-27 DIAGNOSIS — Z79899 Other long term (current) drug therapy: Secondary | ICD-10-CM | POA: Insufficient documentation

## 2017-12-27 DIAGNOSIS — M4802 Spinal stenosis, cervical region: Secondary | ICD-10-CM | POA: Insufficient documentation

## 2017-12-27 DIAGNOSIS — J449 Chronic obstructive pulmonary disease, unspecified: Secondary | ICD-10-CM | POA: Insufficient documentation

## 2017-12-27 DIAGNOSIS — Z7901 Long term (current) use of anticoagulants: Secondary | ICD-10-CM | POA: Insufficient documentation

## 2017-12-27 DIAGNOSIS — I10 Essential (primary) hypertension: Secondary | ICD-10-CM | POA: Insufficient documentation

## 2017-12-27 DIAGNOSIS — Z79891 Long term (current) use of opiate analgesic: Secondary | ICD-10-CM | POA: Insufficient documentation

## 2017-12-27 DIAGNOSIS — M47812 Spondylosis without myelopathy or radiculopathy, cervical region: Secondary | ICD-10-CM | POA: Insufficient documentation

## 2017-12-27 DIAGNOSIS — Z87891 Personal history of nicotine dependence: Secondary | ICD-10-CM | POA: Insufficient documentation

## 2017-12-27 DIAGNOSIS — M47816 Spondylosis without myelopathy or radiculopathy, lumbar region: Secondary | ICD-10-CM | POA: Insufficient documentation

## 2017-12-27 DIAGNOSIS — Z7982 Long term (current) use of aspirin: Secondary | ICD-10-CM | POA: Insufficient documentation

## 2017-12-27 DIAGNOSIS — E119 Type 2 diabetes mellitus without complications: Secondary | ICD-10-CM | POA: Insufficient documentation

## 2017-12-27 DIAGNOSIS — M48061 Spinal stenosis, lumbar region without neurogenic claudication: Secondary | ICD-10-CM | POA: Insufficient documentation

## 2018-01-17 ENCOUNTER — Telehealth: Payer: Self-pay

## 2018-01-17 DIAGNOSIS — J449 Chronic obstructive pulmonary disease, unspecified: Secondary | ICD-10-CM

## 2018-01-17 NOTE — Telephone Encounter (Signed)
Patient has not attended [name] in over a month due to being sick. He states he is in the hospital now with Pneumonia. Informed patient of discharge and that if he wants to return he will need a new referral from physician. Patient verbalizes understanding.

## 2018-01-17 NOTE — Progress Notes (Signed)
Discharge Progress Report  Patient Details  Name: William Bray MRN: [id number] Date of Birth: 03/16/1951 Referring Provider:     Pulmonary Rehab from 09/30/2017 in [facility]  Referring Provider  [name] MD       Number of Visits: 14/36  Reason for Discharge:  Early Exit:  Personal  Smoking History:  Social History   Tobacco Use  Smoking Status Former Smoker  . Packs/day: 1.00  . Years: 45.00  . Pack years: 45.00  . Types: Cigarettes  . Last attempt to quit: 05/28/2005  . Years since quitting: 12.6  Smokeless Tobacco Never Used    Diagnosis:  Chronic obstructive pulmonary disease, unspecified COPD type ([place])  ADL UCSD: Pulmonary Assessment Scores    Row Name 09/30/17 1428         ADL UCSD   ADL Phase  Entry     SOB Score total  73     Rest  1     Walk  2     Stairs  5     Bath  2     Dress  3     Shop  5       CAT Score   CAT Score  22       mMRC Score   mMRC Score  3        Initial Exercise Prescription: Initial Exercise Prescription - 09/30/17 1500      Date of Initial Exercise RX and Referring Provider   Date  09/30/17    Referring Provider  [name] MD      Oxygen   Oxygen  Continuous    Liters  4      Treadmill   MPH  1.3    Grade  0    Minutes  15    METs  2      NuStep   Level  2    SPM  80    Minutes  15    METs  2      REL-XR   Level  1    Speed  50    Minutes  15    METs  2      Prescription Details   Frequency (times per week)  3    Duration  Progress to 45 minutes of aerobic exercise without signs/symptoms of physical distress      Intensity   THRR 40-80% of Max Heartrate  108-138    Ratings of Perceived Exertion  11-13    Perceived Dyspnea  0-4      Progression   Progression  Continue to progress workloads to maintain intensity without signs/symptoms of physical distress.      Resistance Training   Training Prescription  Yes    Weight  4 lbs    Reps  10-15        Discharge Exercise Prescription (Final Exercise Prescription Changes): Exercise Prescription Changes - 11/13/17 1500      Response to Exercise   Blood Pressure (Admit)  --   90   Blood Pressure (Exit)  --   72   Heart Rate (Admit)  80 bpm    Heart Rate (Exercise)  118 bpm    Heart Rate (Exit)  87 bpm    Oxygen Saturation (Admit)  96 %    Oxygen Saturation (Exercise)  96 %    Rating of Perceived Exertion (Exercise)  14    Perceived Dyspnea (Exercise)  3  Duration  Progress to 45 minutes of aerobic exercise without signs/symptoms of physical distress    Intensity  THRR unchanged      Progression   Progression  Continue to progress workloads to maintain intensity without signs/symptoms of physical distress.      Resistance Training   Training Prescription  Yes    Weight  3 lb    Reps  10-15      Oxygen   Oxygen  Continuous    Liters  3      Treadmill   MPH  1.3    Grade  0    Minutes  15    METs  2      NuStep   Level  3    SPM  80    Minutes  15    METs  1.6      REL-XR   Level  3    Speed  50    Minutes  15    METs  1.7      Home Exercise Plan   Plans to continue exercise at  Home (comment)   has 3 and 5 lb weights   Frequency  Add 1 additional day to program exercise sessions.    Initial Home Exercises Provided  11/06/17       Functional Capacity: 6 Minute Walk    Row Name 09/30/17 1550         6 Minute Walk   Phase  Initial     Distance  625 feet     Walk Time  3.9 minutes     # of Rest Breaks  4 15 sec, 44 sec, 49 sec, 20 sec     MPH  1.82     METS  2     RPE  17     Perceived Dyspnea   3     VO2 Peak  6.99     Symptoms  Yes (comment)     Comments  SOB, back pain 8/10     Resting HR  78 bpm     Resting BP  - 80 dopplar     Resting Oxygen Saturation   96 %     Exercise Oxygen Saturation  during 6 min walk  83 %     Max Ex. HR  121 bpm     Max Ex. BP  - 112 dopplar     2 Minute Post BP  - 114 dopplar, rck 106       Interval HR    4 Minute HR  117     5 Minute HR  121     6 Minute HR  117     2 Minute Post HR  103     Interval Heart Rate?  Yes       Interval Oxygen   Interval Oxygen?  Yes     Baseline Oxygen Saturation %  96 %     1 Minute Liters of Oxygen  4 L pulsed     2 Minute Liters of Oxygen  4 L     3 Minute Liters of Oxygen  4 L     4 Minute Oxygen Saturation %  86 %     4 Minute Liters of Oxygen  5 L pt increased himself     5 Minute Oxygen Saturation %  83 %     5 Minute Liters of Oxygen  5 L     6 Minute Oxygen Saturation %  89 % 85% once seated     6 Minute Liters of Oxygen  5 L     2 Minute Post Oxygen Saturation %  90 %     2 Minute Post Liters of Oxygen  4 L        Psychological, QOL, Others - Outcomes: PHQ 2/9: Depression screen [facility] 2/9 12/11/2017 11/20/2017 10/22/2017 09/30/2017 08/28/2016  Decreased Interest 0 0 2 2 1   Down, Depressed, Hopeless 1 0 2 2 1   PHQ - 2 Score 1 0 4 4 2   Altered sleeping 0 - 2 2 1   Tired, decreased energy 1 - 2 2 1   Change in appetite 1 - 2 2 1   Feeling bad or failure about yourself  0 - 2 1 1   Trouble concentrating 0 - 2 1 1   Moving slowly or fidgety/restless 0 - 2 1 1   Suicidal thoughts 0 - 0 0 0  PHQ-9 Score 3 - 16 13 8   Difficult doing work/chores Not difficult at all - Somewhat difficult Very difficult Not difficult at all    Quality of Life:   Personal Goals: Goals established at orientation with interventions provided to work toward goal. Personal Goals and Risk Factors at Admission - 09/30/17 1438      Core Components/Risk Factors/Patient Goals on Admission    Weight Management  Yes;Weight Maintenance;Weight Loss    Intervention  Weight Management: Develop a combined nutrition and exercise program designed to reach desired caloric intake, while maintaining appropriate intake of nutrient and fiber, sodium and fats, and appropriate energy expenditure required for the weight goal.;Weight Management: Provide education and appropriate resources to help  participant work on and attain dietary goals.;Weight Management/Obesity: Establish reasonable short term and long term weight goals.    Admit Weight  181 lb 4.8 oz (82.2 kg)    Goal Weight: Short Term  170 lb (77.1 kg)    Goal Weight: Long Term  170 lb (77.1 kg)    Expected Outcomes  Short Term: Continue to assess and modify interventions until short term weight is achieved;Long Term: Adherence to nutrition and physical activity/exercise program aimed toward attainment of established weight goal;Weight Maintenance: Understanding of the daily nutrition guidelines, which includes 25-35% calories from fat, 7% or less cal from saturated fats, less than 258m cholesterol, less than 1.5gm of sodium, & 5 or more servings of fruits and vegetables daily;Weight Loss: Understanding of general recommendations for a balanced deficit meal plan, which promotes 1-2 lb weight loss per week and includes a negative energy balance of [phone] kcal/d;Understanding recommendations for meals to include 15-35% energy as protein, 25-35% energy from fat, 35-60% energy from carbohydrates, less than 207mof dietary cholesterol, 20-35 gm of total fiber daily;Understanding of distribution of calorie intake throughout the day with the consumption of 4-5 meals/snacks    Improve shortness of breath with ADL's  Yes    Intervention  Provide education, individualized exercise plan and daily activity instruction to help decrease symptoms of SOB with activities of daily living.    Expected Outcomes  Short Term: Improve cardiorespiratory fitness to achieve a reduction of symptoms when performing ADLs;Long Term: Be able to perform more ADLs without symptoms or delay the onset of symptoms    Diabetes  Yes    Intervention  Provide education about signs/symptoms and action to take for hypo/hyperglycemia.;Provide education about proper nutrition, including hydration, and aerobic/resistive exercise prescription along with prescribed medications to  achieve blood glucose in normal ranges: Fasting glucose 65-99 mg/dL  Expected Outcomes  Short Term: Participant verbalizes understanding of the signs/symptoms and immediate care of hyper/hypoglycemia, proper foot care and importance of medication, aerobic/resistive exercise and nutrition plan for blood glucose control.;Long Term: Attainment of HbA1C < 7%.    Heart Failure  Yes    Intervention  Provide a combined exercise and nutrition program that is supplemented with education, support and counseling about heart failure. Directed toward relieving symptoms such as shortness of breath, decreased exercise tolerance, and extremity edema.    Expected Outcomes  Improve functional capacity of life;Short term: Attendance in program 2-3 days a week with increased exercise capacity. Reported lower sodium intake. Reported increased fruit and vegetable intake. Reports medication compliance.;Short term: Daily weights obtained and reported for increase. Utilizing diuretic protocols set by physician.;Long term: Adoption of self-care skills and reduction of barriers for early signs and symptoms recognition and intervention leading to self-care maintenance.    Hypertension  Yes    Intervention  Provide education on lifestyle modifcations including regular physical activity/exercise, weight management, moderate sodium restriction and increased consumption of fresh fruit, vegetables, and low fat dairy, alcohol moderation, and smoking cessation.;Monitor prescription use compliance.    Expected Outcomes  Short Term: Continued assessment and intervention until BP is < 140/97m HG in hypertensive participants. < 130/881mHG in hypertensive participants with diabetes, heart failure or chronic kidney disease.;Long Term: Maintenance of blood pressure at goal levels.        Personal Goals Discharge: Goals and Risk Factor Review    Row Name 10/14/17 1350 11/06/17 1020           Core Components/Risk Factors/Patient Goals  Review   Personal Goals Review  Weight Management/Obesity;Heart Failure;Improve shortness of breath with ADL's;Diabetes;Hypertension  Weight Management/Obesity;Heart Failure;Improve shortness of breath with ADL's;Diabetes;Hypertension      Review  William Bray recently started the program. Today he was presenting with incresed SOB and weight gain.  After a call to his LVAD team , William Bray a game plan with his diurectics to work on the fluid overload. He is taking all his meds and checking his blood sugar levels at home.   His AM fasting BS is 140-200 per William Bray HE will report this to his MD.    William Bray been checking his blood sugar at home and has been in the 140s-150-s fasting. His weight goal is to be 180 pounds. He is able to take a shower by himself, dress himself and do house work without any issues. His blood pressure has been 70s-80s and stable. 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 His back and his artheritis have been giving him issues. He is trying to get into the pain clinic and had his first appointment last week or so.      Expected Outcomes  ST: William Bray improve the SOB symptoms as he gains control of excess fluid. He will speak to his MD about his blood Glucose elvels and he will continue to attend the program to work on buliding his stamina and strength. LT William Bray be able to know when to call his MD about his weight gain and will continue to keep other risk factors under control.   Short: work with the pain clinis to manage his back pain. Long: obtain treatment to help with his back and artheritis.         Exercise Goals and Review: Exercise Goals    Row Name 09/30/17 1600  Exercise Goals   Increase Physical Activity  Yes       Intervention  Provide advice, education, support and counseling about physical activity/exercise needs.;Develop an individualized exercise prescription for aerobic and resistive training based on initial evaluation findings, risk stratification,  comorbidities and participant's personal goals.       Expected Outcomes  Short Term: Attend rehab on a regular basis to increase amount of physical activity.;Long Term: Add in home exercise to make exercise part of routine and to increase amount of physical activity.;Long Term: Exercising regularly at least 3-5 days a week.       Increase Strength and Stamina  Yes       Intervention  Provide advice, education, support and counseling about physical activity/exercise needs.;Develop an individualized exercise prescription for aerobic and resistive training based on initial evaluation findings, risk stratification, comorbidities and participant's personal goals.       Expected Outcomes  Short Term: Increase workloads from initial exercise prescription for resistance, speed, and METs.;Short Term: Perform resistance training exercises routinely during rehab and add in resistance training at home;Long Term: Improve cardiorespiratory fitness, muscular endurance and strength as measured by increased METs and functional capacity (6MWT)       Able to understand and use rate of perceived exertion (RPE) scale  Yes       Intervention  Provide education and explanation on how to use RPE scale       Expected Outcomes  Long Term:  Able to use RPE to guide intensity level when exercising independently;Short Term: Able to use RPE daily in rehab to express subjective intensity level       Able to understand and use Dyspnea scale  Yes       Intervention  Provide education and explanation on how to use Dyspnea scale       Expected Outcomes  Long Term: Able to use Dyspnea scale to guide intensity level when exercising independently;Short Term: Able to use Dyspnea scale daily in rehab to express subjective sense of shortness of breath during exertion       Knowledge and understanding of Target Heart Rate Range (THRR)  Yes       Intervention  Provide education and explanation of THRR including how the numbers were predicted and  where they are located for reference       Expected Outcomes  Short Term: Able to state/look up THRR;Short Term: Able to use daily as guideline for intensity in rehab;Long Term: Able to use THRR to govern intensity when exercising independently       Able to check pulse independently  Yes       Intervention  Provide education and demonstration on how to check pulse in carotid and radial arteries.;Review the importance of being able to check your own pulse for safety during independent exercise       Expected Outcomes  Short Term: Able to explain why pulse checking is important during independent exercise;Long Term: Able to check pulse independently and accurately       Understanding of Exercise Prescription  Yes       Intervention  Provide education, explanation, and written materials on patient's individual exercise prescription       Expected Outcomes  Short Term: Able to explain program exercise prescription;Long Term: Able to explain home exercise prescription to exercise independently          Nutrition & Weight - Outcomes: Pre Biometrics - 09/30/17 1601      Pre Biometrics  Height  5' 8.7" (1.745 m)    Weight  181 lb 4.8 oz (82.2 kg)    Waist Circumference  39 inches    Hip Circumference  38.5 inches    Waist to Hip Ratio  1.01 %    BMI (Calculated)  27.01    Single Leg Stand  5.15 seconds        Nutrition: Nutrition Therapy & Goals - 10/16/17 1021      Nutrition Therapy   Diet  DM / TLC    Drug/Food Interactions  Coumadin/Vit K    Protein (specify units)  12oz    Fiber  30 grams    Whole Grain Foods  3 servings    Saturated Fats  15 max. grams    Fruits and Vegetables  5 servings/day   8 ideal   Sodium  2000 grams      Personal Nutrition Goals   Nutrition Goal  Eat on a consistent schedule daily in order to stimulate appetite / hunger    Personal Goal #2  If you continue to find yourself without an appetite, consuming a diet higher in fat can help you to consume more  calories with a smaller volume of food    Comments  He would like to maintain his CBW but does not always feel hungry. He has had dietary education off and on since [place], educate and counsel regarding individualized specific dietary modifications aiming towards targeted core components such as weight, hypertension, lipid management, diabetes, heart failure and other comorbidities.    Expected Outcomes  Short Term Goal: Understand basic principles of dietary content, such as calories, fat, sodium, cholesterol and nutrients.;Short Term Goal: A plan has been developed with personal nutrition goals set during dietitian appointment.;Long Term Goal: Adherence to prescribed nutrition plan.       Nutrition Discharge: Nutrition Assessments - 09/30/17 1427      MEDFICTS Scores   Pre Score  30       Education Questionnaire Score: Knowledge Questionnaire Score - 09/30/17 1436      Knowledge Questionnaire Score   Pre Score  15/18   reviewed with patient      Goals reviewed with patient; copy given to patient.

## 2018-01-17 NOTE — Progress Notes (Signed)
Pulmonary Individual Treatment Plan  Patient Details  Name: William Bray MRN: [id number] Date of Birth: [birth year] Referring Provider:     Pulmonary Rehab from 09/30/2017 in [facility]  Referring Provider  [name] MD      Initial Encounter Date:    Pulmonary Rehab from 09/30/2017 in [facility] Pulmonary Rehab  Date  09/30/17      Visit Diagnosis: Chronic obstructive pulmonary disease, unspecified COPD type ([place])  Patient's Home Medications on Admission:  Current Outpatient Medications:  .  albuterol (PROVENTIL) (2.5 MG/3ML) 0.083% nebulizer solution, Inhale 3 mLs into the lungs every 6 (six) hours as needed., Disp: , Rfl:  .  allopurinol (ZYLOPRIM) 100 MG tablet, Take 100 mg by mouth daily., Disp: , Rfl:  .  ALPRAZolam (XANAX) 0.5 MG tablet, Take 0.5 mg by mouth 2 (two) times daily as needed. ONE IN THE MORNING AND ONE AT BEDTIME PRN, Disp: , Rfl:  .  aspirin EC 81 MG tablet, Take 81 mg by mouth daily., Disp: , Rfl:  .  budesonide (PULMICORT) 0.5 MG/2ML nebulizer solution, Inhale 2 mLs into the lungs 2 (two) times daily., Disp: , Rfl:  .  cetirizine (ZYRTEC) 10 MG tablet, Take 10 mg by mouth daily., Disp: , Rfl:  .  cyanocobalamin 100 MCG tablet, Take 100 mcg by mouth daily., Disp: , Rfl:  .  ferrous sulfate 324 (65 Fe) MG TBEC, Take 324 mg by mouth every morning. WITH BREAKFAST, Disp: , Rfl:  .  FLUoxetine (PROZAC) 20 MG capsule, Take 80 mg by mouth daily., Disp: , Rfl:  .  gabapentin (NEURONTIN) 300 MG capsule, Take 600 mg by mouth 3 (three) times daily. , Disp: , Rfl:  .  lisinopril (PRINIVIL,ZESTRIL) 2.5 MG tablet, Take 2.5 mg by mouth daily., Disp: , Rfl:  .  magnesium oxide (MAG-OX) 400 MG tablet, Take 400 mg by mouth daily., Disp: , Rfl:  .  metoprolol tartrate (LOPRESSOR) 25 MG tablet, Take 25 mg by mouth 2 (two) times daily., Disp: , Rfl:  .  mirtazapine (REMERON) 30 MG tablet, Take 0.5 tablets by mouth at bedtime., Disp: , Rfl:  .   montelukast (SINGULAIR) 10 MG tablet, Take 10 mg by mouth at bedtime., Disp: , Rfl:  .  Multiple Vitamins-Minerals (MULTIVITAMIN WITH MINERALS) tablet, Take 1 tablet by mouth daily., Disp: , Rfl:  .  ondansetron (ZOFRAN) 4 MG tablet, Take 4 mg by mouth every 8 (eight) hours as needed for nausea., Disp: , Rfl:  .  oxycodone (OXY-IR) 5 MG capsule, Take 5 mg by mouth every 4 (four) hours as needed., Disp: , Rfl:  .  pantoprazole (PROTONIX) 40 MG tablet, Take 40 mg by mouth daily., Disp: , Rfl:  .  pravastatin (PRAVACHOL) 40 MG tablet, Take 40 mg by mouth at bedtime., Disp: , Rfl:  .  spironolactone (ALDACTONE) 25 MG tablet, Take 25 mg by mouth daily., Disp: , Rfl:  .  torsemide (DEMADEX) 20 MG tablet, Take 2 tablets by mouth daily., Disp: , Rfl:  .  umeclidinium-vilanterol (ANORO ELLIPTA) 62.5-25 MCG/INH AEPB, Inhale 1 puff into the lungs daily., Disp: , Rfl:  .  warfarin (COUMADIN) 3 MG tablet, Take 3 mg by mouth 3 (three) times a week., Disp: , Rfl:  .  warfarin (COUMADIN) 4 MG tablet, Take 4 mg by mouth 4 (four) times a week., Disp: , Rfl:  .  warfarin (COUMADIN) 5 MG tablet, Take 5 mg by mouth as directed. Taking 5  mg M, W, F and 4 mg T, Th, Sat, Sun., Disp: , Rfl:   Past Medical History: Past Medical History:  Diagnosis Date  . Arthritis   . Chronic back pain   . Diabetes mellitus without complication ([place])   . Hypertension   . Neuropathy     Tobacco Use: Social History   Tobacco Use  Smoking Status Former Smoker  . Packs/day: 1.00  . Years: 45.00  . Pack years: 45.00  . Types: Cigarettes  . Last attempt to quit: 05/28/2005  . Years since quitting: 12.6  Smokeless Tobacco Never Used    Labs: Recent Review Flowsheet Data    Labs for ITP Cardiac and Pulmonary Rehab Latest Ref Rng & Units 08/08/2012 04/28/2013 11/05/2013 05/30/2014 06/02/2014   Cholestrol 0 - 200 mg/dL 105 - 127 64 -   LDLCALC 0 - 100 mg/dL 37 - 74 27 -   HDL 40 - 60 mg/dL 33(L) - 33(L) 15(L) -   Trlycerides 0 - 200  mg/dL 173 - 99 108 -   Hemoglobin A1c 4.2 - 6.3 % - 5.9 - - 5.3       Pulmonary Assessment Scores: Pulmonary Assessment Scores    Row Name 09/30/17 1428         ADL UCSD   ADL Phase  Entry     SOB Score total  73     Rest  1     Walk  2     Stairs  5     Bath  2     Dress  3     Shop  5       CAT Score   CAT Score  22       mMRC Score   mMRC Score  3        Pulmonary Function Assessment: Pulmonary Function Assessment - 09/30/17 1429      Initial Spirometry Results   FVC%  54 %    FEV1%  29 %    FEV1/FVC Ratio  39.67    Comments  test done on 08/28/16      Post Bronchodilator Spirometry Results   FVC%  64 %    FEV1%  37 %    FEV1/FVC Ratio  38.57    Comments  test done on 08/28/16      Breath   Shortness of Breath  Yes;Limiting activity;Fear of Shortness of Breath       Exercise Target Goals: Exercise Program Goal: Individual exercise prescription set using results from initial 6 min walk test and THRR while considering  patient's activity barriers and safety.   Exercise Prescription Goal: Initial exercise prescription builds to 30-45 minutes a day of aerobic activity, 2-3 days per week.  Home exercise guidelines will be given to patient during program as part of exercise prescription that the participant will acknowledge.  Activity Barriers & Risk Stratification: Activity Barriers & Cardiac Risk Stratification - 09/30/17 1555      Activity Barriers & Cardiac Risk Stratification   Activity Barriers  Deconditioning;Muscular Weakness;Shortness of Breath;Decreased Ventricular Function;Balance Concerns;Back Problems   chronic back pain      6 Minute Walk: 6 Minute Walk    Row Name 09/30/17 1550         6 Minute Walk   Phase  Initial     Distance  625 feet     Walk Time  3.9 minutes     # of Rest Breaks  4 15 sec, 44 sec,  49 sec, 20 sec     MPH  1.82     METS  2     RPE  17     Perceived Dyspnea   3     VO2 Peak  6.99     Symptoms  Yes (comment)      Comments  SOB, back pain 8/10     Resting HR  78 bpm     Resting BP  - 80 dopplar     Resting Oxygen Saturation   96 %     Exercise Oxygen Saturation  during 6 min walk  83 %     Max Ex. HR  121 bpm     Max Ex. BP  - 112 dopplar     2 Minute Post BP  - 114 dopplar, rck 106       Interval HR   4 Minute HR  117     5 Minute HR  121     6 Minute HR  117     2 Minute Post HR  103     Interval Heart Rate?  Yes       Interval Oxygen   Interval Oxygen?  Yes     Baseline Oxygen Saturation %  96 %     1 Minute Liters of Oxygen  4 L pulsed     2 Minute Liters of Oxygen  4 L     3 Minute Liters of Oxygen  4 L     4 Minute Oxygen Saturation %  86 %     4 Minute Liters of Oxygen  5 L pt increased himself     5 Minute Oxygen Saturation %  83 %     5 Minute Liters of Oxygen  5 L     6 Minute Oxygen Saturation %  89 % 85% once seated     6 Minute Liters of Oxygen  5 L     2 Minute Post Oxygen Saturation %  90 %     2 Minute Post Liters of Oxygen  4 L       Oxygen Initial Assessment: Oxygen Initial Assessment - 09/30/17 1436      Home Oxygen   Home Oxygen Device  Home Concentrator;Portable Concentrator;E-Tanks    Sleep Oxygen Prescription  Continuous;CPAP    Liters per minute  2    Home Exercise Oxygen Prescription  Continuous    Liters per minute  2    Home at Rest Exercise Oxygen Prescription  Continuous    Liters per minute  2    Compliance with Home Oxygen Use  Yes      Initial 6 min Walk   Oxygen Used  Portable Concentrator;Pulsed    Liters per minute  3      Program Oxygen Prescription   Program Oxygen Prescription  Continuous;E-Tanks    Liters per minute  3      Intervention   Short Term Goals  To learn and exhibit compliance with exercise, home and travel O2 prescription;To learn and understand importance of maintaining oxygen saturations>88%;To learn and demonstrate proper use of respiratory medications;To learn and demonstrate proper pursed lip breathing techniques  or other breathing techniques.;To learn and understand importance of monitoring SPO2 with pulse oximeter and demonstrate accurate use of the pulse oximeter.    Long  Term Goals  Exhibits compliance with exercise, home and travel O2 prescription;Verbalizes importance of monitoring SPO2 with pulse oximeter and return demonstration;Maintenance of O2 saturations>88%;Exhibits  proper breathing techniques, such as pursed lip breathing or other method taught during program session;Compliance with respiratory medication;Demonstrates proper use of MDI's       Oxygen Re-Evaluation: Oxygen Re-Evaluation    Row Name 10/04/17 1048 11/06/17 1028           Program Oxygen Prescription   Program Oxygen Prescription  -  Continuous;E-Tanks      Liters per minute  -  3        Home Oxygen   Home Oxygen Device  -  Home Concentrator;Portable Concentrator;E-Tanks      Sleep Oxygen Prescription  -  Continuous;CPAP      Liters per minute  -  2      Home Exercise Oxygen Prescription  -  Continuous      Liters per minute  -  2      Home at Rest Exercise Oxygen Prescription  -  Continuous      Liters per minute  -  2      Compliance with Home Oxygen Use  -  Yes        Goals/Expected Outcomes   Short Term Goals  To learn and exhibit compliance with exercise, home and travel O2 prescription;To learn and understand importance of maintaining oxygen saturations>88%;To learn and demonstrate proper use of respiratory medications;To learn and demonstrate proper pursed lip breathing techniques or other breathing techniques.;To learn and understand importance of monitoring SPO2 with pulse oximeter and demonstrate accurate use of the pulse oximeter.  To learn and exhibit compliance with exercise, home and travel O2 prescription;To learn and understand importance of maintaining oxygen saturations>88%;To learn and demonstrate proper use of respiratory medications;To learn and demonstrate proper pursed lip breathing techniques or  other breathing techniques.;To learn and understand importance of monitoring SPO2 with pulse oximeter and demonstrate accurate use of the pulse oximeter.      Long  Term Goals  Exhibits compliance with exercise, home and travel O2 prescription;Verbalizes importance of monitoring SPO2 with pulse oximeter and return demonstration;Maintenance of O2 saturations>88%;Exhibits proper breathing techniques, such as pursed lip breathing or other method taught during program session;Compliance with respiratory medication;Demonstrates proper use of MDI's  Exhibits compliance with exercise, home and travel O2 prescription;Verbalizes importance of monitoring SPO2 with pulse oximeter and return demonstration;Maintenance of O2 saturations>88%;Exhibits proper breathing techniques, such as pursed lip breathing or other method taught during program session;Compliance with respiratory medication;Demonstrates proper use of MDI's      Comments  Reviewed PLB technique with pt.  Talked about how it work and it's important to maintaining his exercise saturations.  William Bray is taking Pulmicort nebulizer and has an albuterol inhaler. He states when he is resting he uses 1.5-2 liters of oxygen. He checks his oxygen at home and he is 34-96 percent. He wants to be off oxygen all together. We will try to decrease his oxygen by 1 liter on some exercise machines, check his oxygen and start weaning.      Goals/Expected Outcomes  Short: Become more profiecient at using PLB.   Long: Become independent at using PLB.  Short: decrease oxygen by 1 liter while exercising. Long: Use no oxygen while at rest.         Oxygen Discharge (Final Oxygen Re-Evaluation): Oxygen Re-Evaluation - 11/06/17 1028      Program Oxygen Prescription   Program Oxygen Prescription  Continuous;E-Tanks    Liters per minute  3      Home Oxygen   Home Oxygen Device  Home Concentrator;Portable  Concentrator;E-Tanks    Sleep Oxygen Prescription  Continuous;CPAP     Liters per minute  2    Home Exercise Oxygen Prescription  Continuous    Liters per minute  2    Home at Rest Exercise Oxygen Prescription  Continuous    Liters per minute  2    Compliance with Home Oxygen Use  Yes      Goals/Expected Outcomes   Short Term Goals  To learn and exhibit compliance with exercise, home and travel O2 prescription;To learn and understand importance of maintaining oxygen saturations>88%;To learn and demonstrate proper use of respiratory medications;To learn and demonstrate proper pursed lip breathing techniques or other breathing techniques.;To learn and understand importance of monitoring SPO2 with pulse oximeter and demonstrate accurate use of the pulse oximeter.    Long  Term Goals  Exhibits compliance with exercise, home and travel O2 prescription;Verbalizes importance of monitoring SPO2 with pulse oximeter and return demonstration;Maintenance of O2 saturations>88%;Exhibits proper breathing techniques, such as pursed lip breathing or other method taught during program session;Compliance with respiratory medication;Demonstrates proper use of MDI's    Comments  William Bray is taking Pulmicort nebulizer and has an albuterol inhaler. He states when he is resting he uses 1.5-2 liters of oxygen. He checks his oxygen at home and he is 34-96 percent. He wants to be off oxygen all together. We will try to decrease his oxygen by 1 liter on some exercise machines, check his oxygen and start weaning.    Goals/Expected Outcomes  Short: decrease oxygen by 1 liter while exercising. Long: Use no oxygen while at rest.       Initial Exercise Prescription: Initial Exercise Prescription - 09/30/17 1500      Date of Initial Exercise RX and Referring Provider   Date  09/30/17    Referring Provider  [name] MD      Oxygen   Oxygen  Continuous    Liters  4      Treadmill   MPH  1.3    Grade  0    Minutes  15    METs  2      NuStep   Level  2    SPM  80    Minutes  15     METs  2      REL-XR   Level  1    Speed  50    Minutes  15    METs  2      Prescription Details   Frequency (times per week)  3    Duration  Progress to 45 minutes of aerobic exercise without signs/symptoms of physical distress      Intensity   THRR 40-80% of Max Heartrate  108-138    Ratings of Perceived Exertion  11-13    Perceived Dyspnea  0-4      Progression   Progression  Continue to progress workloads to maintain intensity without signs/symptoms of physical distress.      Resistance Training   Training Prescription  Yes    Weight  4 lbs    Reps  10-15       Perform Capillary Blood Glucose checks as needed.  Exercise Prescription Changes: Exercise Prescription Changes    Row Name 09/30/17 1500 10/15/17 1200 10/30/17 1400 11/06/17 1100 11/13/17 1500     Response to Exercise   Blood Pressure (Admit)  - 80 dopplar  - 84  - 88  - 88  - 90   Blood Pressure (Exercise)  -  112 dopplar  -  -  -  -   Blood Pressure (Exit)  - 114 dopplar, 106 dopplar recheck  - 84  - 82  - 82  - 72   Heart Rate (Admit)  78 bpm  84 bpm  96 bpm  96 bpm  80 bpm   Heart Rate (Exercise)  121 bpm  101 bpm  99 bpm  99 bpm  118 bpm   Heart Rate (Exit)  90 bpm  -  88 bpm  88 bpm  87 bpm   Oxygen Saturation (Admit)  96 %  89 %  98 %  98 %  96 %   Oxygen Saturation (Exercise)  83 %  97 %  96 %  96 %  96 %   Oxygen Saturation (Exit)  90 %  97 %  96 %  96 %  -   Rating of Perceived Exertion (Exercise)  17  12  13  13  14    Perceived Dyspnea (Exercise)  3  2  3  3  3    Symptoms  back pain 8/10, SOB  -  -  -  -   Comments  walk test results  -  -  -  -   Duration  -  Progress to 45 minutes of aerobic exercise without signs/symptoms of physical distress  Progress to 45 minutes of aerobic exercise without signs/symptoms of physical distress  Progress to 45 minutes of aerobic exercise without signs/symptoms of physical distress  Progress to 45 minutes of aerobic exercise without signs/symptoms of physical  distress   Intensity  -  THRR unchanged  THRR unchanged  THRR unchanged  THRR unchanged     Progression   Progression  -  Continue to progress workloads to maintain intensity without signs/symptoms of physical distress.  Continue to progress workloads to maintain intensity without signs/symptoms of physical distress.  Continue to progress workloads to maintain intensity without signs/symptoms of physical distress.  Continue to progress workloads to maintain intensity without signs/symptoms of physical distress.     Resistance Training   Training Prescription  -  Yes  Yes  Yes  Yes   Weight  -  3 lb  3 lb  3 lb  3 lb   Reps  -  10-15  10-15  10-15  10-15     Oxygen   Oxygen  -  Continuous  Continuous  Continuous  Continuous   Liters  -  3  3  3  3      Treadmill   MPH  -  -  1.2  1.2  1.3   Grade  -  -  0  0  0   Minutes  -  -  15  15  15    METs  -  -  2  2  2      NuStep   Level  -  1  -  -  3   SPM  -  80  -  -  80   Minutes  -  15  -  -  15   METs  -  -  -  -  1.6     REL-XR   Level  -  -  1  1  3    Speed  -  -  50  50  50   Minutes  -  -  15  15  15    METs  -  -  -  -  1.7     Home Exercise Plan   Plans to continue exercise at  -  -  -  Home (comment) has 3 and 5 lb weights  Home (comment) has 3 and 5 lb weights   Frequency  -  -  -  Add 1 additional day to program exercise sessions.  Add 1 additional day to program exercise sessions.   Initial Home Exercises Provided  -  -  -  11/06/17  11/06/17      Exercise Comments: Exercise Comments    Row Name 10/04/17 1047           Exercise Comments   First full day of exercise!  Patient was oriented to gym and equipment including functions, settings, policies, and procedures.  Patient's individual exercise prescription and treatment plan were reviewed.  All starting workloads were established based on the results of the 6 minute walk test done at initial orientation visit.  The plan for exercise progression was also introduced  and progression will be customized based on patient's performance and goals          Exercise Goals and Review: Exercise Goals    Row Name 09/30/17 1600             Exercise Goals   Increase Physical Activity  Yes       Intervention  Provide advice, education, support and counseling about physical activity/exercise needs.;Develop an individualized exercise prescription for aerobic and resistive training based on initial evaluation findings, risk stratification, comorbidities and participant's personal goals.       Expected Outcomes  Short Term: Attend rehab on a regular basis to increase amount of physical activity.;Long Term: Add in home exercise to make exercise part of routine and to increase amount of physical activity.;Long Term: Exercising regularly at least 3-5 days a week.       Increase Strength and Stamina  Yes       Intervention  Provide advice, education, support and counseling about physical activity/exercise needs.;Develop an individualized exercise prescription for aerobic and resistive training based on initial evaluation findings, risk stratification, comorbidities and participant's personal goals.       Expected Outcomes  Short Term: Increase workloads from initial exercise prescription for resistance, speed, and METs.;Short Term: Perform resistance training exercises routinely during rehab and add in resistance training at home;Long Term: Improve cardiorespiratory fitness, muscular endurance and strength as measured by increased METs and functional capacity (6MWT)       Able to understand and use rate of perceived exertion (RPE) scale  Yes       Intervention  Provide education and explanation on how to use RPE scale       Expected Outcomes  Long Term:  Able to use RPE to guide intensity level when exercising independently;Short Term: Able to use RPE daily in rehab to express subjective intensity level       Able to understand and use Dyspnea scale  Yes       Intervention   Provide education and explanation on how to use Dyspnea scale       Expected Outcomes  Long Term: Able to use Dyspnea scale to guide intensity level when exercising independently;Short Term: Able to use Dyspnea scale daily in rehab to express subjective sense of shortness of breath during exertion       Knowledge and understanding of Target Heart Rate Range (THRR)  Yes       Intervention  Provide education and explanation of THRR  including how the numbers were predicted and where they are located for reference       Expected Outcomes  Short Term: Able to state/look up THRR;Short Term: Able to use daily as guideline for intensity in rehab;Long Term: Able to use THRR to govern intensity when exercising independently       Able to check pulse independently  Yes       Intervention  Provide education and demonstration on how to check pulse in carotid and radial arteries.;Review the importance of being able to check your own pulse for safety during independent exercise       Expected Outcomes  Short Term: Able to explain why pulse checking is important during independent exercise;Long Term: Able to check pulse independently and accurately       Understanding of Exercise Prescription  Yes       Intervention  Provide education, explanation, and written materials on patient's individual exercise prescription       Expected Outcomes  Short Term: Able to explain program exercise prescription;Long Term: Able to explain home exercise prescription to exercise independently          Exercise Goals Re-Evaluation : Exercise Goals Re-Evaluation    [place] Name 10/04/17 1048 10/15/17 1252 10/30/17 1449 11/06/17 1150 11/26/17 1542     Exercise Goal Re-Evaluation   Exercise Goals Review  Increase Physical Activity;Increase Strength and Stamina;Able to understand and use rate of perceived exertion (RPE) scale;Able to understand and use Dyspnea scale;Knowledge and understanding of Target Heart Rate Range  (THRR);Understanding of Exercise Prescription  Increase Physical Activity;Able to understand and use rate of perceived exertion (RPE) scale;Increase Strength and Stamina;Able to understand and use Dyspnea scale  Increase Physical Activity;Increase Strength and Stamina;Able to understand and use rate of perceived exertion (RPE) scale;Able to understand and use Dyspnea scale  Increase Physical Activity;Increase Strength and Stamina;Able to understand and use Dyspnea scale;Able to understand and use rate of perceived exertion (RPE) scale;Knowledge and understanding of Target Heart Rate Range (THRR);Able to check pulse independently  -   Comments  Reviewed RPE scale, THR and program prescription with pt today.  Pt voiced understanding and was given a copy of goals to take home.   William Bray is just starting back to exercise.  He is tolerating exercise well and BG has been within acceptable range.    William Bray does the best he can day to day with exercise.  He has missed some sessions due to back pain.  Staff will monitor progress.  Reviewed home exercise - William Bray has had some back pain so we reviewed stretches as well as the strength work to do seated.  Pt voiced understanding.  he has been doing weights at home already.  Out since last review   Expected Outcomes  Short: Use RPE daily to regulate intensity.  Long: Follow program prescription in THR.  Short - pt will attend class 3 days per week Long - Pt will improve MET level   Short - [place] will be able to attend regularly Long - [place] will maintain fitness level  Short - Kyree will do strength work on days not at [employer] will maintain exercise on his own  -      Discharge Exercise Prescription (Final Exercise Prescription Changes): Exercise Prescription Changes - 11/13/17 1500      Response to Exercise   Blood Pressure (Admit)  --   90   Blood Pressure (Exit)  --   72   Heart Rate (  Admit)  80 bpm    Heart Rate (Exercise)  118 bpm    Heart Rate (Exit)  87 bpm     Oxygen Saturation (Admit)  96 %    Oxygen Saturation (Exercise)  96 %    Rating of Perceived Exertion (Exercise)  14    Perceived Dyspnea (Exercise)  3    Duration  Progress to 45 minutes of aerobic exercise without signs/symptoms of physical distress    Intensity  THRR unchanged      Progression   Progression  Continue to progress workloads to maintain intensity without signs/symptoms of physical distress.      Resistance Training   Training Prescription  Yes    Weight  3 lb    Reps  10-15      Oxygen   Oxygen  Continuous    Liters  3      Treadmill   MPH  1.3    Grade  0    Minutes  15    METs  2      NuStep   Level  3    SPM  80    Minutes  15    METs  1.6      REL-XR   Level  3    Speed  50    Minutes  15    METs  1.7      Home Exercise Plan   Plans to continue exercise at  Home (comment)   has 3 and 5 lb weights   Frequency  Add 1 additional day to program exercise sessions.    Initial Home Exercises Provided  11/06/17       Nutrition:  Target Goals: Understanding of nutrition guidelines, daily intake of sodium <1549m, cholesterol <2062m calories 30% from fat and 7% or less from saturated fats, daily to have 5 or more servings of fruits and vegetables.  Biometrics: Pre Biometrics - 09/30/17 1601      Pre Biometrics   Height  5' 8.7" (1.745 m)    Weight  181 lb 4.8 oz (82.2 kg)    Waist Circumference  39 inches    Hip Circumference  38.5 inches    Waist to Hip Ratio  1.01 %    BMI (Calculated)  27.01    Single Leg Stand  5.15 seconds        Nutrition Therapy Plan and Nutrition Goals: Nutrition Therapy & Goals - 10/16/17 1021      Nutrition Therapy   Diet  DM / TLC    Drug/Food Interactions  Coumadin/Vit K    Protein (specify units)  12oz    Fiber  30 grams    Whole Grain Foods  3 servings    Saturated Fats  15 max. grams    Fruits and Vegetables  5 servings/day   8 ideal   Sodium  2000 grams      Personal Nutrition Goals    Nutrition Goal  Eat on a consistent schedule daily in order to stimulate appetite / hunger    Personal Goal #2  If you continue to find yourself without an appetite, consuming a diet higher in fat can help you to consume more calories with a smaller volume of food    Comments  He would like to maintain his CBW but does not always feel hungry. He has had dietary education off and on since [place] and counsel regarding  individualized specific dietary modifications aiming towards targeted core components such as weight, hypertension, lipid management, diabetes, heart failure and other comorbidities.    Expected Outcomes  Short Term Goal: Understand basic principles of dietary content, such as calories, fat, sodium, cholesterol and nutrients.;Short Term Goal: A plan has been developed with personal nutrition goals set during dietitian appointment.;Long Term Goal: Adherence to prescribed nutrition plan.       Nutrition Assessments: Nutrition Assessments - 09/30/17 1427      MEDFICTS Scores   Pre Score  30       Nutrition Goals Re-Evaluation: Nutrition Goals Re-Evaluation    Row Name 10/16/17 1023 10/16/17 1024 11/06/17 1035         Goals   Current Weight  -  -  189 lb (85.7 kg)     Nutrition Goal  If you continue to find yourself without an appetite, consuming a diet higher in fat can help you to consume more calories with a smaller volume of food  -  William Bray wants to weigh 180 pounds without his LVAD. Do not over eat.     Comment  He c/o decreased appetite  -  William Bray states he is eating too much food. He met with the dietician but states he eats too many vegatables. One doctor tells him that he needs to lose weight and one that says he needs to gain weight.     Expected Outcome  He will eat foods high in fat around the same times each day in order to maximize his caloric intake  -  Short: Eat healthy food and not over eat. Long: Obtain a  healthy weight.       Personal Goal #2 Re-Evaluation   Personal Goal #2  -  Eat on a consistent schedule daily in order to stimulate hunger/ appetite  -        Nutrition Goals Discharge (Final Nutrition Goals Re-Evaluation): Nutrition Goals Re-Evaluation - 11/06/17 1035      Goals   Current Weight  189 lb (85.7 kg)    Nutrition Goal  Ty wants to weigh 180 pounds without his LVAD. Do not over eat.    Comment  Maddock states he is eating too much food. He met with the dietician but states he eats too many vegatables. One doctor tells him that he needs to lose weight and one that says he needs to gain weight.    Expected Outcome  Short: Eat healthy food and not over eat. Long: Obtain a healthy weight.       Psychosocial: Target Goals: Acknowledge presence or absence of significant depression and/or stress, maximize coping skills, provide positive support system. Participant is able to verbalize types and ability to use techniques and skills needed for reducing stress and depression.   Initial Review & Psychosocial Screening: Initial Psych Review & Screening - 09/30/17 1425      Initial Review   Current issues with  History of Depression;Current Psychotropic Meds;Current Stress Concerns    Source of Stress Concerns  Chronic Illness;Unable to perform yard/household activities    Comments  He feels like his breathing is getting worse and wants to get better      [place]?  Yes    Comments  William Bray has great support from his wife, daughter, and son.       Barriers   Psychosocial barriers to participate in program  The patient should benefit from training in stress management and  relaxation.      Screening Interventions   Interventions  Encouraged to exercise;Provide feedback about the scores to participant;Program counselor consult;To provide support and resources with identified psychosocial needs    Expected Outcomes  Short Term goal: Utilizing  psychosocial counselor, staff and physician to assist with identification of specific Stressors or current issues interfering with healing process. Setting desired goal for each stressor or current issue identified.;Long Term Goal: Stressors or current issues are controlled or eliminated.;Short Term goal: Identification and review with participant of any Quality of Life or Depression concerns found by scoring the questionnaire.;Long Term goal: The participant improves quality of Life and PHQ9 Scores as seen by post scores and/or verbalization of changes       Quality of Life Scores:  Scores of 19 and below usually indicate a poorer quality of life in these areas.  A difference of  2-3 points is a clinically meaningful difference.  A difference of 2-3 points in the total score of the Quality of Life Index has been associated with significant improvement in overall quality of life, self-image, physical symptoms, and general health in studies assessing change in quality of life.  PHQ-9: Recent Review Flowsheet Data    Depression screen [facility] 2/9 12/11/2017 11/20/2017 10/22/2017 09/30/2017 08/28/2016   Decreased Interest 0 0 2 2 1    Down, Depressed, Hopeless 1 0 2 2 1    PHQ - 2 Score 1 0 4 4 2    Altered sleeping 0 - 2 2 1    Tired, decreased energy 1 - 2 2 1    Change in appetite 1 - 2 2 1    Feeling bad or failure about yourself  0 - 2 1 1    Trouble concentrating 0 - 2 1 1    Moving slowly or fidgety/restless 0 - 2 1 1    Suicidal thoughts 0 - 0 0 0   PHQ-9 Score 3 - 16 13 8    Difficult doing work/chores Not difficult at all - Somewhat difficult Very difficult Not difficult at all     Interpretation of Total Score  Total Score Depression Severity:  1-4 = Minimal depression, 5-9 = Mild depression, 10-14 = Moderate depression, 15-19 = Moderately severe depression, 20-27 = Severe depression   Psychosocial Evaluation and Intervention: Psychosocial Evaluation - 10/16/17 1023      Psychosocial Evaluation &  Interventions   Comments  William Bray has returned to Pulmonary Rehab after being in the hospital for several months late December/early January.  He is a 67 year old who has multiple health issues with CHF and COPD primarily along with severe back problems.  Bayani has a strong support system with a spouse; sisters and a sister-in-law; a son and a daughter locally.  He reports not sleeping great with maybe 5-6 hours of sleep per night.  His appetite is "fine" currently.  Ferris reports a history of anxiety with some symptoms present several times each day.  He is on medication for this that helps somewhat.  Leshon reports being in a positive mood most of the time and his health is his primary stressor.  He has goals to get stronger and breathe better overall.  Staff will follow with William Bray     Expected Outcomes  Short:  William Bray will exercise to reduce his anxiety symptoms.   Long:  Aimar will exercise consistently to recover his stamina and strength and breathe better overall.      Continue Psychosocial Services   Follow up required by staff  Psychosocial Re-Evaluation: Psychosocial Re-Evaluation    [place] Name 10/14/17 1341 11/06/17 1046           Psychosocial Re-Evaluation   Current issues with  -  Current Sleep Concerns;Current Stress Concerns;Current Depression;History of Depression      Comments  William Bray has just joined the prgram after a year put. He had been in the Maintenance classes and his MD wanted him in Pulmonary Rehab. He is struggling with increased SOB today, his weight is up 9 pounds over a week.  He called his LVAD team and reviewed this info with them. He will keep them informed of how his weight changes.  William Bray recently was placed on Prednisone, and the staff did explain how it can make him retain fluid, it was emphasized that he should be weiging himself daily and reporting 2 0r more weight gain to the LVAD team. He is upset that he is more SOB today and wants to feel better.  He was told to gain  some weight and thought the weight gain was form eating.  Will continue to followup with William Bray and encourage him to closely watch his weight., as the more contol of keeping the excess fluid will help with the SOB and his ability to keep up the exercise, which wil help his stamina increase and overall feeling better-whichis his ultimate goal.    William Bray cant do what he wants to do, he does not like being handicapped. Ever since his health went down hill he has been more depressed. He states he is depressed about everything.       Expected Outcomes  -  Short: Attened [name] regularly to decrease stress. Long: graduate [name] to keep stress at a minimum.      Interventions  Therapist referral;Encouraged to attend Pulmonary Rehabilitation for the exercise;Relaxation education  Therapist referral;Encouraged to attend Pulmonary Rehabilitation for the exercise;Relaxation education      Continue Psychosocial Services   Follow up required by counselor  Follow up required by staff         Psychosocial Discharge (Final Psychosocial Re-Evaluation): Psychosocial Re-Evaluation - 11/06/17 1046      Psychosocial Re-Evaluation   Current issues with  Current Sleep Concerns;Current Stress Concerns;Current Depression;History of Depression    Comments  Marl cant do what he wants to do, he does not like being handicapped. Ever since his health went down hill he has been more depressed. He states he is depressed about everything.     Expected Outcomes  Short: Attened [name] regularly to decrease stress. Long: graduate [name] to keep stress at a minimum.    Interventions  Therapist referral;Encouraged to attend Pulmonary Rehabilitation for the exercise;Relaxation education    Continue Psychosocial Services   Follow up required by staff       Education: Education Goals: Education classes will be provided on a weekly basis, covering required topics. Participant will state understanding/return demonstration of  topics presented.  Learning Barriers/Preferences: Learning Barriers/Preferences - 09/30/17 1436      Learning Barriers/Preferences   Learning Barriers  None    Learning Preferences  None       Education Topics:  Initial Evaluation Education: - Verbal, written and demonstration of respiratory meds, oximetry and breathing techniques. Instruction on use of nebulizers and MDIs and importance of monitoring MDI activations.   Pulmonary Rehab from 10/23/2017 in [facility] Cardiac and Pulmonary Rehab  Date  09/30/17  Educator  [facility]  Instruction Review Code  1- Verbalizes Understanding  General Nutrition Guidelines/Fats and Fiber: -Group instruction provided by verbal, written material, models and posters to present the general guidelines for heart healthy nutrition. Gives an explanation and review of dietary fats and fiber.   Pulmonary Rehab from 10/23/2017 in [facility] Cardiac and Pulmonary Rehab  Date  10/14/17  Educator  CR  Instruction Review Code  1- Verbalizes Understanding      Controlling Sodium/Reading Food Labels: -Group verbal and written material supporting the discussion of sodium use in heart healthy nutrition. Review and explanation with models, verbal and written materials for utilization of the food label.   Exercise Physiology & General Exercise Guidelines: - Group verbal and written instruction with models to review the exercise physiology of the cardiovascular system and associated critical values. Provides general exercise guidelines with specific guidelines to those with heart or lung disease.    Aerobic Exercise & Resistance Training: - Gives group verbal and written instruction on the various components of exercise. Focuses on aerobic and resistive training programs and the benefits of this training and how to safely progress through these programs.   Cardiac Rehab from 03/21/2016 in [facility] Cardiac and Pulmonary Rehab  Date  02/29/16  Educator  [facility]  Instruction Review  Code (retired)  2- Statistician, Balance, Mind/Body Relaxation: Provides group verbal/written instruction on the benefits of flexibility and balance training, including mind/body exercise modes such as yoga, pilates and tai chi.  Demonstration and skill practice provided.   Cardiac Rehab from 03/21/2016 in [facility] Cardiac and Pulmonary Rehab  Date  01/09/16  Educator  [facility]  Instruction Review Code (retired)  2- meets goals/outcomes      Stress and Anxiety: - Provides group verbal and written instruction about the health risks of elevated stress and causes of high stress.  Discuss the correlation between heart/lung disease and anxiety and treatment options. Review healthy ways to manage with stress and anxiety.   Pulmonary Rehab from 10/23/2017 in [facility] Cardiac and Pulmonary Rehab  Date  10/23/17  Educator  [facility]  Instruction Review Code  1- Verbalizes Understanding      Depression: - Provides group verbal and written instruction on the correlation between heart/lung disease and depressed mood, treatment options, and the stigmas associated with seeking treatment.   Exercise & Equipment Safety: - Individual verbal instruction and demonstration of equipment use and safety with use of the equipment.   Pulmonary Rehab from 10/23/2017 in [facility] Cardiac and Pulmonary Rehab  Date  09/30/17  Educator  [facility]  Instruction Review Code  1- Verbalizes Understanding      Infection Prevention: - Provides verbal and written material to individual with discussion of infection control including proper hand washing and proper equipment cleaning during exercise session.   Pulmonary Rehab from 10/23/2017 in [facility] Cardiac and Pulmonary Rehab  Date  09/30/17  Educator  [facility]  Instruction Review Code  1- Verbalizes Understanding      Falls Prevention: - Provides verbal and written material to individual with discussion of falls prevention and safety.   Pulmonary Rehab from 10/23/2017 in [facility]  Cardiac and Pulmonary Rehab  Date  09/30/17  Educator  [facility]  Instruction Review Code  1- Verbalizes Understanding      Diabetes: - Individual verbal and written instruction to review signs/symptoms of diabetes, desired ranges of glucose level fasting, after meals and with exercise. Advice that pre and post exercise glucose checks will be done for 3 sessions at entry of program.   Cardiac Rehab  from 03/21/2016 in [facility] Cardiac and Pulmonary Rehab  Date  01/02/16  Educator  D. Joya Gaskins, RN  Instruction Review Code (retired)  2- meets goals/outcomes      Chronic Lung Diseases: - Group verbal and written instruction to review updates, respiratory medications, advancements in procedures and treatments. Discuss use of supplemental oxygen including available portable oxygen systems, continuous and intermittent flow rates, concentrators, personal use and safety guidelines. Review proper use of inhaler and spacers. Provide informative websites for self-education.    Energy Conservation: - Provide group verbal and written instruction for methods to conserve energy, plan and organize activities. Instruct on pacing techniques, use of adaptive equipment and posture/positioning to relieve shortness of breath.   Pulmonary Rehab from 10/23/2017 in [facility] Cardiac and Pulmonary Rehab  Date  10/16/17  Educator  [facility]  Instruction Review Code  1- Verbalizes Understanding      Triggers and Exacerbations: - Group verbal and written instruction to review types of environmental triggers and ways to prevent exacerbations. Discuss weather changes, air quality and the benefits of nasal washing. Review warning signs and symptoms to help prevent infections. Discuss techniques for effective airway clearance, coughing, and vibrations.   AED/CPR: - Group verbal and written instruction with the use of models to demonstrate the basic use of the AED with the basic ABC's of resuscitation.   Pulmonary Rehab from 10/23/2017 in  [facility] Cardiac and Pulmonary Rehab  Date  10/18/17  Educator  [place]  Instruction Review Code  1- Verbalizes Understanding      Anatomy and Physiology of the Lungs: - Group verbal and written instruction with the use of models to provide basic lung anatomy and physiology related to function, structure and complications of lung disease.   Anatomy & Physiology of the Heart: - Group verbal and written instruction and models provide basic cardiac anatomy and physiology, with the coronary electrical and arterial systems. Review of Valvular disease and Heart Failure   Cardiac Rehab from 03/21/2016 in [facility] Cardiac and Pulmonary Rehab  Date  01/16/16  Educator  [facility]  Instruction Review Code (retired)  2- meets goals/outcomes      Cardiac Medications: - Group verbal and written instruction to review commonly prescribed medications for heart disease. Reviews the medication, class of the drug, and side effects.   Pulmonary Rehab from 10/23/2017 in [facility] Cardiac and Pulmonary Rehab  Date  10/04/17  Educator  [facility]  Instruction Review Code  1- Verbalizes Understanding      Know Your Numbers and Risk Factors: -Group verbal and written instruction about important numbers in your health.  Discussion of what are risk factors and how they play a role in the disease process.  Review of Cholesterol, Blood Pressure, Diabetes, and BMI and the role they play in your overall health.   Sleep Hygiene: -Provides group verbal and written instruction about how sleep can affect your health.  Define sleep hygiene, discuss sleep cycles and impact of sleep habits. Review good sleep hygiene tips.    Other: -Provides group and verbal instruction on various topics (see comments)    Knowledge Questionnaire Score: Knowledge Questionnaire Score - 09/30/17 1436      Knowledge Questionnaire Score   Pre Score  15/18   reviewed with patient       Core Components/Risk Factors/Patient Goals at Admission: Personal Goals  and Risk Factors at Admission - 09/30/17 1438      Core Components/Risk Factors/Patient Goals on Admission    Weight Management  Yes;Weight Maintenance;Weight Loss  Intervention  Weight Management: Develop a combined nutrition and exercise program designed to reach desired caloric intake, while maintaining appropriate intake of nutrient and fiber, sodium and fats, and appropriate energy expenditure required for the weight goal.;Weight Management: Provide education and appropriate resources to help participant work on and attain dietary goals.;Weight Management/Obesity: Establish reasonable short term and long term weight goals.    Admit Weight  181 lb 4.8 oz (82.2 kg)    Goal Weight: Short Term  170 lb (77.1 kg)    Goal Weight: Long Term  170 lb (77.1 kg)    Expected Outcomes  Short Term: Continue to assess and modify interventions until short term weight is achieved;Long Term: Adherence to nutrition and physical activity/exercise program aimed toward attainment of established weight goal;Weight Maintenance: Understanding of the daily nutrition guidelines, which includes 25-35% calories from fat, 7% or less cal from saturated fats, less than 293m cholesterol, less than 1.5gm of sodium, & 5 or more servings of fruits and vegetables daily;Weight Loss: Understanding of general recommendations for a balanced deficit meal plan, which promotes 1-2 lb weight loss per week and includes a negative energy balance of [phone] kcal/d;Understanding recommendations for meals to include 15-35% energy as protein, 25-35% energy from fat, 35-60% energy from carbohydrates, less than 2082mof dietary cholesterol, 20-35 gm of total fiber daily;Understanding of distribution of calorie intake throughout the day with the consumption of 4-5 meals/snacks    Improve shortness of breath with ADL's  Yes    Intervention  Provide education, individualized exercise plan and daily activity instruction to help decrease symptoms of SOB  with activities of daily living.    Expected Outcomes  Short Term: Improve cardiorespiratory fitness to achieve a reduction of symptoms when performing ADLs;Long Term: Be able to perform more ADLs without symptoms or delay the onset of symptoms    Diabetes  Yes    Intervention  Provide education about signs/symptoms and action to take for hypo/hyperglycemia.;Provide education about proper nutrition, including hydration, and aerobic/resistive exercise prescription along with prescribed medications to achieve blood glucose in normal ranges: Fasting glucose 65-99 mg/dL    Expected Outcomes  Short Term: Participant verbalizes understanding of the signs/symptoms and immediate care of hyper/hypoglycemia, proper foot care and importance of medication, aerobic/resistive exercise and nutrition plan for blood glucose control.;Long Term: Attainment of HbA1C < 7%.    Heart Failure  Yes    Intervention  Provide a combined exercise and nutrition program that is supplemented with education, support and counseling about heart failure. Directed toward relieving symptoms such as shortness of breath, decreased exercise tolerance, and extremity edema.    Expected Outcomes  Improve functional capacity of life;Short term: Attendance in program 2-3 days a week with increased exercise capacity. Reported lower sodium intake. Reported increased fruit and vegetable intake. Reports medication compliance.;Short term: Daily weights obtained and reported for increase. Utilizing diuretic protocols set by physician.;Long term: Adoption of self-care skills and reduction of barriers for early signs and symptoms recognition and intervention leading to self-care maintenance.    Hypertension  Yes    Intervention  Provide education on lifestyle modifcations including regular physical activity/exercise, weight management, moderate sodium restriction and increased consumption of fresh fruit, vegetables, and low fat dairy, alcohol moderation, and  smoking cessation.;Monitor prescription use compliance.    Expected Outcomes  Short Term: Continued assessment and intervention until BP is < 140/9071mG in hypertensive participants. < 130/63m19m in hypertensive participants with diabetes, heart failure or chronic kidney disease.;Long Term: Maintenance  of blood pressure at goal levels.       Core Components/Risk Factors/Patient Goals Review:  Goals and Risk Factor Review    Row Name 10/14/17 1350 11/06/17 1020           Core Components/Risk Factors/Patient Goals Review   Personal Goals Review  Weight Management/Obesity;Heart Failure;Improve shortness of breath with ADL's;Diabetes;Hypertension  Weight Management/Obesity;Heart Failure;Improve shortness of breath with ADL's;Diabetes;Hypertension      Review  Hicks has recently started the program. Today he was presenting with incresed SOB and weight gain.  After a call to his LVAD team , Erman has a game plan with his diurectics to work on the fluid overload. He is taking all his meds and checking his blood sugar levels at home.   His AM fasting BS is 140-200 per William Bray.  HE will report this to his MD.    Machai has been checking his blood sugar at home and has been in the 140s-150-s fasting. His weight goal is to be 180 pounds. He is able to take a shower by himself, dress himself and do house work without any issues. His blood pressure has been 70s-80s and stable. 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 His back and his artheritis have been giving him issues. He is trying to get into the pain clinic and had his first appointment last week or so.      Expected Outcomes  ST: Fount will improve the SOB symptoms as he gains control of excess fluid. He will speak to his MD about his blood Glucose elvels and he will continue to attend the program to work on buliding his stamina and strength. LT Conrado will be able to know when to call his MD about his weight gain and will continue to keep other risk factors under control.    Short: work with the pain clinis to manage his back pain. Long: obtain treatment to help with his back and artheritis.         Core Components/Risk Factors/Patient Goals at Discharge (Final Review):  Goals and Risk Factor Review - 11/06/17 1020      Core Components/Risk Factors/Patient Goals Review   Personal Goals Review  Weight Management/Obesity;Heart Failure;Improve shortness of breath with ADL's;Diabetes;Hypertension    Review  Marrell has been checking his blood sugar at home and has been in the 140s-150-s fasting. His weight goal is to be 180 pounds. He is able to take a shower by himself, dress himself and do house work without any issues. His blood pressure has been 70s-80s and stable. 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 His back and his artheritis have been giving him issues. He is trying to get into the pain clinic and had his first appointment last week or so.    Expected Outcomes  Short: work with the pain clinis to manage his back pain. Long: obtain treatment to help with his back and artheritis.       ITP Comments: ITP Comments    Row Name 09/30/17 1400 10/14/17 1122 10/14/17 1339 10/18/17 1147 10/28/17 0812   ITP Comments  Medical Evaluation completed. Chart sent for review and changes to Dr. Emily Bray Director of [place]. Diagnosis can be found in [facility] encounter 09/16/17  Patient arrived and had gained 9 lbs in 7 days and was short of breath. We was advised by staff not to exercise and to call his LVAD team at [facility]. He called his his team and they cleared him to exercise over the  phone. He did exercise at a very light intensity and tolerated it well.   Patient arrived and had gained 9 lbs in 7 days and was short of breath. We was advised by staff not to exercise and to call his LVAD team at [facility]. He called his his team and they cleared him to exercise over the phone. He did exercise at a very light intensity and tolerated it well.    Pt's BP was 64. Patient reported lightheadedness and  weakness. He reported that he usually takes lisinopril 64m daily at night. He recently refilled his lisinopril and realized that the dose was 231minstead of 1055mHe checked his medication list and noticed it is 76m53m he took 76mg76mt night but he is not sure when the dose was changed to 76mg.53mwas asked to call his PCP to verify the right dose. PCP verbalized that dose is 10mg n58m0mg. P60munk 2 glasses of water. Will continue to monitor patient's status.    30 day review completed. ITP sent to Dr. Mark MilEmily Filbertr of [facility] with ITP unless changes are made by physician   Row Name 11/25/17 0941 12/11/17 1149 12/18/17 1450 12/23/17 0835 01/17/18 1037   ITP Comments  30 day review completed. ITP sent to Dr. Mark [facility]ily Filbertr of [facility] with ITP unless changes are made by physician  Alistair hasFahd attended since 11/09/17 due to a back injury.  He is in PT and plans to return to [facility].  [facility] has had a back [facility]. Patient hopes to return soon.  30 day review completed. ITP sent to Dr. Mark MilEmily Filbertr of [facility] with ITP unless changes are made by physician  Patient has not attended [name] in over a month due to being sick. He states he is in the hospital now with Pneumonia. Informed patient of discharge and that if he wants to return he will need a new referral from physician. Patient verbalizes understanding.   Row NamePajaro/23/19 1201           ITP Comments  Discharge ITP sent and signed by Dr. Miller. Sabra Heckarge Summary routed to PCP and pulmonologist.          Comments: Discharge ITP

## 2018-03-24 ENCOUNTER — Encounter: Payer: Medicare Other | Attending: Pulmonary Disease | Admitting: *Deleted

## 2018-03-24 VITALS — Ht 69.0 in | Wt 200.2 lb

## 2018-03-24 DIAGNOSIS — Z7901 Long term (current) use of anticoagulants: Secondary | ICD-10-CM | POA: Diagnosis not present

## 2018-03-24 DIAGNOSIS — E119 Type 2 diabetes mellitus without complications: Secondary | ICD-10-CM | POA: Diagnosis not present

## 2018-03-24 DIAGNOSIS — Z79899 Other long term (current) drug therapy: Secondary | ICD-10-CM | POA: Insufficient documentation

## 2018-03-24 DIAGNOSIS — Z79891 Long term (current) use of opiate analgesic: Secondary | ICD-10-CM | POA: Insufficient documentation

## 2018-03-24 DIAGNOSIS — J449 Chronic obstructive pulmonary disease, unspecified: Secondary | ICD-10-CM | POA: Insufficient documentation

## 2018-03-24 DIAGNOSIS — I10 Essential (primary) hypertension: Secondary | ICD-10-CM | POA: Diagnosis not present

## 2018-03-24 DIAGNOSIS — Z7982 Long term (current) use of aspirin: Secondary | ICD-10-CM | POA: Insufficient documentation

## 2018-03-24 DIAGNOSIS — Z87891 Personal history of nicotine dependence: Secondary | ICD-10-CM | POA: Insufficient documentation

## 2018-03-24 NOTE — Patient Instructions (Signed)
Patient Instructions  Patient Details  Name: William Bray MRN: [id number] Date of Birth: [birth year] Referring Provider:  Julienne Kass, MD  Below are your personal goals for exercise, nutrition, and risk factors. Our goal is to help you stay on track towards obtaining and maintaining these goals. We will be discussing your progress on these goals with you throughout the program.  Initial Exercise Prescription: Initial Exercise Prescription - 03/24/18 1500      Date of Initial Exercise RX and Referring Provider   Date  03/24/18    Referring Provider  Kelle Darting MD      Oxygen   Oxygen  Continuous    Liters  3      Treadmill   MPH  1.8    Grade  0    Minutes  15    METs  2.38      REL-XR   Level  1    Speed  50    Minutes  15    METs  2      T5 Nustep   Level  1    SPM  80    Minutes  15    METs  2      Prescription Details   Frequency (times per week)  3    Duration  Progress to 45 minutes of aerobic exercise without signs/symptoms of physical distress      Intensity   THRR 40-80% of Max Heartrate  116-141    Ratings of Perceived Exertion  11-13    Perceived Dyspnea  0-4      Progression   Progression  Continue to progress workloads to maintain intensity without signs/symptoms of physical distress.      Resistance Training   Training Prescription  Yes    Weight  3 lbs    Reps  10-15       Exercise Goals: Frequency: Be able to perform aerobic exercise two to three times per week in program working toward 2-5 days per week of home exercise.  Intensity: Work with a perceived exertion of 11 (fairly light) - 15 (hard) while following your exercise prescription.  We will make changes to your prescription with you as you progress through the program.   Duration: Be able to do 30 to 45 minutes of continuous aerobic exercise in addition to a 5 minute warm-up and a 5 minute cool-down routine.   Nutrition Goals: Your personal nutrition goals will be  established when you do your nutrition analysis with the dietician.  The following are general nutrition guidelines to follow: Cholesterol < 270m/day Sodium < 15055mday Fiber: Men over 50 yrs - 30 grams per day  Personal Goals: Personal Goals and Risk Factors at Admission - 03/24/18 1448      Core Components/Risk Factors/Patient Goals on Admission    Weight Management  Yes;Weight Maintenance;Weight Loss    Intervention  Weight Management: Develop a combined nutrition and exercise program designed to reach desired caloric intake, while maintaining appropriate intake of nutrient and fiber, sodium and fats, and appropriate energy expenditure required for the weight goal.;Weight Management: Provide education and appropriate resources to help participant work on and attain dietary goals.;Weight Management/Obesity: Establish reasonable short term and long term weight goals.;Obesity: Provide education and appropriate resources to help participant work on and attain dietary goals.    Admit Weight  200 lb 3.2 oz (90.8 kg)    Goal Weight: Short Term  195 lb (88.5 kg)    Goal Weight: Long  Term  185 lb (83.9 kg)    Expected Outcomes  Short Term: Continue to assess and modify interventions until short term weight is achieved;Long Term: Adherence to nutrition and physical activity/exercise program aimed toward attainment of established weight goal;Weight Loss: Understanding of general recommendations for a balanced deficit meal plan, which promotes 1-2 lb weight loss per week and includes a negative energy balance of [id number] kcal/d;Understanding recommendations for meals to include 15-35% energy as protein, 25-35% energy from fat, 35-60% energy from carbohydrates, less than 285m of dietary cholesterol, 20-35 gm of total fiber daily;Understanding of distribution of calorie intake throughout the day with the consumption of 4-5 meals/snacks    Improve shortness of breath with ADL's  Yes    Intervention  Provide  education, individualized exercise plan and daily activity instruction to help decrease symptoms of SOB with activities of daily living.    Expected Outcomes  Short Term: Improve cardiorespiratory fitness to achieve a reduction of symptoms when performing ADLs;Long Term: Be able to perform more ADLs without symptoms or delay the onset of symptoms    Heart Failure  Yes    Intervention  Provide a combined exercise and nutrition program that is supplemented with education, support and counseling about heart failure. Directed toward relieving symptoms such as shortness of breath, decreased exercise tolerance, and extremity edema.    Expected Outcomes  Improve functional capacity of life;Short term: Attendance in program 2-3 days a week with increased exercise capacity. Reported lower sodium intake. Reported increased fruit and vegetable intake. Reports medication compliance.;Short term: Daily weights obtained and reported for increase. Utilizing diuretic protocols set by physician.;Long term: Adoption of self-care skills and reduction of barriers for early signs and symptoms recognition and intervention leading to self-care maintenance.    Hypertension  Yes    Intervention  Provide education on lifestyle modifcations including regular physical activity/exercise, weight management, moderate sodium restriction and increased consumption of fresh fruit, vegetables, and low fat dairy, alcohol moderation, and smoking cessation.;Monitor prescription use compliance.    Expected Outcomes  Short Term: Continued assessment and intervention until BP is < 140/98mHG in hypertensive participants. < 130/8028mG in hypertensive participants with diabetes, heart failure or chronic kidney disease.;Long Term: Maintenance of blood pressure at goal levels.    Lipids  Yes    Intervention  Provide education and support for participant on nutrition & aerobic/resistive exercise along with prescribed medications to achieve LDL <52m6mHDL >40mg60m Expected Outcomes  Short Term: Participant states understanding of desired cholesterol values and is compliant with medications prescribed. Participant is following exercise prescription and nutrition guidelines.;Long Term: Cholesterol controlled with medications as prescribed, with individualized exercise RX and with personalized nutrition plan. Value goals: LDL < 52mg,71m > 40 mg.       Tobacco Use Initial Evaluation: Social History   Tobacco Use  Smoking Status Former Smoker  . Packs/day: 1.00  . Years: 45.00  . Pack years: 45.00  . Types: Cigarettes  . Last attempt to quit: 05/28/2005  . Years since quitting: 12.8  Smokeless Tobacco Never Used    Exercise Goals and Review: Exercise Goals    Row Name 09/30/17 1600 03/24/18 1552           Exercise Goals   Increase Physical Activity  Yes  Yes      Intervention  Provide advice, education, support and counseling about physical activity/exercise needs.;Develop an individualized exercise prescription for aerobic and resistive training based on initial evaluation findings,  risk stratification, comorbidities and participant's personal goals.  Provide advice, education, support and counseling about physical activity/exercise needs.;Develop an individualized exercise prescription for aerobic and resistive training based on initial evaluation findings, risk stratification, comorbidities and participant's personal goals.      Expected Outcomes  Short Term: Attend rehab on a regular basis to increase amount of physical activity.;Long Term: Add in home exercise to make exercise part of routine and to increase amount of physical activity.;Long Term: Exercising regularly at least 3-5 days a week.  Short Term: Attend rehab on a regular basis to increase amount of physical activity.;Long Term: Add in home exercise to make exercise part of routine and to increase amount of physical activity.;Long Term: Exercising regularly at least 3-5 days  a week.      Increase Strength and Stamina  Yes  Yes      Intervention  Provide advice, education, support and counseling about physical activity/exercise needs.;Develop an individualized exercise prescription for aerobic and resistive training based on initial evaluation findings, risk stratification, comorbidities and participant's personal goals.  Provide advice, education, support and counseling about physical activity/exercise needs.;Develop an individualized exercise prescription for aerobic and resistive training based on initial evaluation findings, risk stratification, comorbidities and participant's personal goals.      Expected Outcomes  Short Term: Increase workloads from initial exercise prescription for resistance, speed, and METs.;Short Term: Perform resistance training exercises routinely during rehab and add in resistance training at home;Long Term: Improve cardiorespiratory fitness, muscular endurance and strength as measured by increased METs and functional capacity (6MWT)  Short Term: Increase workloads from initial exercise prescription for resistance, speed, and METs.;Short Term: Perform resistance training exercises routinely during rehab and add in resistance training at home;Long Term: Improve cardiorespiratory fitness, muscular endurance and strength as measured by increased METs and functional capacity (6MWT)      Able to understand and use rate of perceived exertion (RPE) scale  Yes  Yes      Intervention  Provide education and explanation on how to use RPE scale  Provide education and explanation on how to use RPE scale      Expected Outcomes  Long Term:  Able to use RPE to guide intensity level when exercising independently;Short Term: Able to use RPE daily in rehab to express subjective intensity level  Long Term:  Able to use RPE to guide intensity level when exercising independently;Short Term: Able to use RPE daily in rehab to express subjective intensity level      Able to  understand and use Dyspnea scale  Yes  Yes      Intervention  Provide education and explanation on how to use Dyspnea scale  Provide education and explanation on how to use Dyspnea scale      Expected Outcomes  Long Term: Able to use Dyspnea scale to guide intensity level when exercising independently;Short Term: Able to use Dyspnea scale daily in rehab to express subjective sense of shortness of breath during exertion  Long Term: Able to use Dyspnea scale to guide intensity level when exercising independently;Short Term: Able to use Dyspnea scale daily in rehab to express subjective sense of shortness of breath during exertion      Knowledge and understanding of Target Heart Rate Range (THRR)  Yes  Yes      Intervention  Provide education and explanation of THRR including how the numbers were predicted and where they are located for reference  Provide education and explanation of THRR including how the numbers were  predicted and where they are located for reference      Expected Outcomes  Short Term: Able to state/look up THRR;Short Term: Able to use daily as guideline for intensity in rehab;Long Term: Able to use THRR to govern intensity when exercising independently  Short Term: Able to state/look up THRR;Short Term: Able to use daily as guideline for intensity in rehab;Long Term: Able to use THRR to govern intensity when exercising independently      Able to check pulse independently  Yes  Yes      Intervention  Provide education and demonstration on how to check pulse in carotid and radial arteries.;Review the importance of being able to check your own pulse for safety during independent exercise  Provide education and demonstration on how to check pulse in carotid and radial arteries.;Review the importance of being able to check your own pulse for safety during independent exercise      Expected Outcomes  Short Term: Able to explain why pulse checking is important during independent exercise;Long Term:  Able to check pulse independently and accurately  Short Term: Able to explain why pulse checking is important during independent exercise;Long Term: Able to check pulse independently and accurately      Understanding of Exercise Prescription  Yes  Yes      Intervention  Provide education, explanation, and written materials on patient's individual exercise prescription  Provide education, explanation, and written materials on patient's individual exercise prescription      Expected Outcomes  Short Term: Able to explain program exercise prescription;Long Term: Able to explain home exercise prescription to exercise independently  Short Term: Able to explain program exercise prescription;Long Term: Able to explain home exercise prescription to exercise independently         Copy of goals given to participant.

## 2018-03-24 NOTE — Progress Notes (Signed)
Pulmonary Individual Treatment Plan  Patient Details  Name: William Bray MRN: [id number] Date of Birth: 09/16/1950 Referring Provider:     Pulmonary Rehab from 03/24/2018 in [facility] Cardiac and Pulmonary Rehab  Referring Provider  Kelle Darting MD      Initial Encounter Date:    Pulmonary Rehab from 03/24/2018 in [facility] Cardiac and Pulmonary Rehab  Date  03/24/18      Visit Diagnosis: Chronic obstructive pulmonary disease, unspecified COPD type ([place])  Patient's Home Medications on Admission:  Current Outpatient Medications:  .  albuterol (PROVENTIL) (2.5 MG/3ML) 0.083% nebulizer solution, Inhale 3 mLs into the lungs every 6 (six) hours as needed., Disp: , Rfl:  .  allopurinol (ZYLOPRIM) 100 MG tablet, Take 100 mg by mouth daily., Disp: , Rfl:  .  ALPRAZolam (XANAX) 0.5 MG tablet, Take 0.5 mg by mouth 2 (two) times daily as needed. ONE IN THE MORNING AND ONE AT BEDTIME PRN, Disp: , Rfl:  .  aspirin EC 81 MG tablet, Take 81 mg by mouth daily., Disp: , Rfl:  .  budesonide (PULMICORT) 0.5 MG/2ML nebulizer solution, Inhale 2 mLs into the lungs 2 (two) times daily., Disp: , Rfl:  .  cyanocobalamin 100 MCG tablet, Take 100 mcg by mouth daily., Disp: , Rfl:  .  ferrous sulfate 324 (65 Fe) MG TBEC, Take 324 mg by mouth every morning. WITH BREAKFAST, Disp: , Rfl:  .  gabapentin (NEURONTIN) 300 MG capsule, Take 600 mg by mouth 3 (three) times daily. , Disp: , Rfl:  .  magnesium oxide (MAG-OX) 400 MG tablet, Take 400 mg by mouth daily., Disp: , Rfl:  .  mirtazapine (REMERON) 30 MG tablet, Take 0.5 tablets by mouth at bedtime., Disp: , Rfl:  .  Multiple Vitamins-Minerals (MULTIVITAMIN WITH MINERALS) tablet, Take 1 tablet by mouth daily., Disp: , Rfl:  .  ondansetron (ZOFRAN) 4 MG tablet, Take 4 mg by mouth every 8 (eight) hours as needed for nausea., Disp: , Rfl:  .  umeclidinium-vilanterol (ANORO ELLIPTA) 62.5-25 MCG/INH AEPB, Inhale 1 puff into the lungs daily., Disp: , Rfl:  .  warfarin  (COUMADIN) 4 MG tablet, Take 4 mg by mouth 4 (four) times a week., Disp: , Rfl:  .  cetirizine (ZYRTEC) 10 MG tablet, Take 10 mg by mouth daily., Disp: , Rfl:  .  FLUoxetine (PROZAC) 20 MG capsule, Take 80 mg by mouth daily., Disp: , Rfl:  .  lisinopril (PRINIVIL,ZESTRIL) 2.5 MG tablet, Take 2.5 mg by mouth daily., Disp: , Rfl:  .  metoprolol tartrate (LOPRESSOR) 25 MG tablet, Take 25 mg by mouth 2 (two) times daily., Disp: , Rfl:  .  montelukast (SINGULAIR) 10 MG tablet, Take 10 mg by mouth at bedtime., Disp: , Rfl:  .  oxycodone (OXY-IR) 5 MG capsule, Take 5 mg by mouth every 4 (four) hours as needed., Disp: , Rfl:  .  pantoprazole (PROTONIX) 40 MG tablet, Take 40 mg by mouth daily., Disp: , Rfl:  .  pravastatin (PRAVACHOL) 40 MG tablet, Take 40 mg by mouth at bedtime., Disp: , Rfl:  .  spironolactone (ALDACTONE) 25 MG tablet, Take 25 mg by mouth daily., Disp: , Rfl:  .  torsemide (DEMADEX) 20 MG tablet, Take 2 tablets by mouth daily., Disp: , Rfl:  .  warfarin (COUMADIN) 3 MG tablet, Take 3 mg by mouth 3 (three) times a week., Disp: , Rfl:  .  warfarin (COUMADIN) 5 MG tablet, Take 5 mg by mouth as directed. Taking 5  mg M, W, F and 4 mg T, Th, Sat, Sun., Disp: , Rfl:   Past Medical History: Past Medical History:  Diagnosis Date  . Arthritis   . Chronic back pain   . Diabetes mellitus without complication ([place])   . Hypertension   . Neuropathy     Tobacco Use: Social History   Tobacco Use  Smoking Status Former Smoker  . Packs/day: 1.00  . Years: 45.00  . Pack years: 45.00  . Types: Cigarettes  . Last attempt to quit: 05/28/2005  . Years since quitting: 12.8  Smokeless Tobacco Never Used    Labs: Recent Review Flowsheet Data    Labs for ITP Cardiac and Pulmonary Rehab Latest Ref Rng & Units 08/08/2012 04/28/2013 11/05/2013 05/30/2014 06/02/2014   Cholestrol 0 - 200 mg/dL 105 - 127 64 -   LDLCALC 0 - 100 mg/dL 37 - 74 27 -   HDL 40 - 60 mg/dL 33(L) - 33(L) 15(L) -   Trlycerides 0 -  200 mg/dL 173 - 99 108 -   Hemoglobin A1c 4.2 - 6.3 % - 5.9 - - 5.3       Pulmonary Assessment Scores: Pulmonary Assessment Scores    Row Name 09/30/17 1428 03/24/18 1530       ADL UCSD   ADL Phase  Entry  Entry    SOB Score total  73  -    Rest  1  -    Walk  2  -    Stairs  5  -    Bath  2  -    Dress  3  -    Shop  5  -      CAT Score   CAT Score  22  -      mMRC Score   mMRC Score  3  3       Pulmonary Function Assessment: Pulmonary Function Assessment - 09/30/17 1429      Initial Spirometry Results   FVC%  54 %    FEV1%  29 %    FEV1/FVC Ratio  39.67    Comments  test done on 08/28/16      Post Bronchodilator Spirometry Results   FVC%  64 %    FEV1%  37 %    FEV1/FVC Ratio  38.57    Comments  test done on 08/28/16      Breath   Shortness of Breath  Yes;Limiting activity;Fear of Shortness of Breath       Exercise Target Goals: Exercise Program Goal: Individual exercise prescription set using results from initial 6 min walk test and THRR while considering  patient's activity barriers and safety.   Exercise Prescription Goal: Initial exercise prescription builds to 30-45 minutes a day of aerobic activity, 2-3 days per week.  Home exercise guidelines will be given to patient during program as part of exercise prescription that the participant will acknowledge.  Activity Barriers & Risk Stratification: Activity Barriers & Cardiac Risk Stratification - 03/24/18 1548      Activity Barriers & Cardiac Risk Stratification   Activity Barriers  Deconditioning;Muscular Weakness;Shortness of Breath;Decreased Ventricular Function;Balance Concerns;Back Problems       6 Minute Walk: 6 Minute Walk    Row Name 09/30/17 1550 03/24/18 1544       6 Minute Walk   Phase  Initial  Initial    Distance  625 feet  760 feet    Walk Time  3.9 minutes  4.6 minutes    #  of Rest Breaks  4 15 sec, 44 sec, 49 sec, 20 sec  3 15 sec, 9 sec, 1 min    MPH  1.82  1.88    METS  2   2.39    RPE  17  13    Perceived Dyspnea   3  2    VO2 Peak  6.99  8.36    Symptoms  Yes (comment)  Yes (comment)    Comments  SOB, back pain 8/10  SOB, back and leg pain 8/10    Resting HR  78 bpm  92 bpm    Resting BP  - 80 dopplar  132/64    Resting Oxygen Saturation   96 %  95 %    Exercise Oxygen Saturation  during 6 min walk  83 %  86 %    Max Ex. HR  121 bpm  132 bpm    Max Ex. BP  - 112 dopplar  136/64    2 Minute Post BP  - 114 dopplar, rck 106  124/62      Interval HR   1 Minute HR  -  83    2 Minute HR  -  81    3 Minute HR  -  94    4 Minute HR  117  98    5 Minute HR  121  117    6 Minute HR  117  132    2 Minute Post HR  103  108    Interval Heart Rate?  Yes  Yes      Interval Oxygen   Interval Oxygen?  Yes  Yes    Baseline Oxygen Saturation %  96 %  95 %    1 Minute Oxygen Saturation %  -  92 %    1 Minute Liters of Oxygen  4 L pulsed  3 L    2 Minute Oxygen Saturation %  -  88 % rest break 2:20-2:35    2 Minute Liters of Oxygen  4 L  3 L    3 Minute Oxygen Saturation %  -  89 % rest break 3:14-3:23    3 Minute Liters of Oxygen  4 L  3 L    4 Minute Oxygen Saturation %  86 %  90 % rest break 4:13-5:13    4 Minute Liters of Oxygen  5 L pt increased himself  3 L    5 Minute Oxygen Saturation %  83 %  86 %    5 Minute Liters of Oxygen  5 L  3 L    6 Minute Oxygen Saturation %  89 % 85% once seated  88 %    6 Minute Liters of Oxygen  5 L  3 L    2 Minute Post Oxygen Saturation %  90 %  90 %    2 Minute Post Liters of Oxygen  4 L  3 L      Oxygen Initial Assessment: Oxygen Initial Assessment - 03/24/18 1445      Home Oxygen   Home Oxygen Device  Home Concentrator;Portable Concentrator;E-Tanks    Sleep Oxygen Prescription  Continuous   Now using Trilogy machine versus CPAP   Liters per minute  2    Home Exercise Oxygen Prescription  Continuous    Liters per minute  3    Home at Rest Exercise Oxygen Prescription  Continuous    Liters per minute  2  Compliance with Home Oxygen Use  Yes      Initial 6 min Walk   Oxygen Used  Portable Concentrator;Pulsed      Program Oxygen Prescription   Program Oxygen Prescription  Continuous;E-Tanks    Liters per minute  3      Intervention   Short Term Goals  To learn and exhibit compliance with exercise, home and travel O2 prescription;To learn and understand importance of maintaining oxygen saturations>88%;To learn and demonstrate proper use of respiratory medications;To learn and demonstrate proper pursed lip breathing techniques or other breathing techniques.;To learn and understand importance of monitoring SPO2 with pulse oximeter and demonstrate accurate use of the pulse oximeter.    Long  Term Goals  Exhibits compliance with exercise, home and travel O2 prescription;Verbalizes importance of monitoring SPO2 with pulse oximeter and return demonstration;Maintenance of O2 saturations>88%;Exhibits proper breathing techniques, such as pursed lip breathing or other method taught during program session;Compliance with respiratory medication;Demonstrates proper use of MDI's       Oxygen Re-Evaluation: Oxygen Re-Evaluation    Row Name 10/04/17 1048 11/06/17 1028           Program Oxygen Prescription   Program Oxygen Prescription  -  Continuous;E-Tanks      Liters per minute  -  3        Home Oxygen   Home Oxygen Device  -  Home Concentrator;Portable Concentrator;E-Tanks      Sleep Oxygen Prescription  -  Continuous;CPAP      Liters per minute  -  2      Home Exercise Oxygen Prescription  -  Continuous      Liters per minute  -  2      Home at Rest Exercise Oxygen Prescription  -  Continuous      Liters per minute  -  2      Compliance with Home Oxygen Use  -  Yes        Goals/Expected Outcomes   Short Term Goals  To learn and exhibit compliance with exercise, home and travel O2 prescription;To learn and understand importance of maintaining oxygen saturations>88%;To learn and demonstrate  proper use of respiratory medications;To learn and demonstrate proper pursed lip breathing techniques or other breathing techniques.;To learn and understand importance of monitoring SPO2 with pulse oximeter and demonstrate accurate use of the pulse oximeter.  To learn and exhibit compliance with exercise, home and travel O2 prescription;To learn and understand importance of maintaining oxygen saturations>88%;To learn and demonstrate proper use of respiratory medications;To learn and demonstrate proper pursed lip breathing techniques or other breathing techniques.;To learn and understand importance of monitoring SPO2 with pulse oximeter and demonstrate accurate use of the pulse oximeter.      Long  Term Goals  Exhibits compliance with exercise, home and travel O2 prescription;Verbalizes importance of monitoring SPO2 with pulse oximeter and return demonstration;Maintenance of O2 saturations>88%;Exhibits proper breathing techniques, such as pursed lip breathing or other method taught during program session;Compliance with respiratory medication;Demonstrates proper use of MDI's  Exhibits compliance with exercise, home and travel O2 prescription;Verbalizes importance of monitoring SPO2 with pulse oximeter and return demonstration;Maintenance of O2 saturations>88%;Exhibits proper breathing techniques, such as pursed lip breathing or other method taught during program session;Compliance with respiratory medication;Demonstrates proper use of MDI's      Comments  Reviewed PLB technique with pt.  Talked about how it work and it's important to maintaining his exercise saturations.  William Bray is taking Pulmicort nebulizer and has an albuterol inhaler.  He states when he is resting he uses 1.5-2 liters of oxygen. He checks his oxygen at home and he is 34-96 percent. He wants to be off oxygen all together. We will try to decrease his oxygen by 1 liter on some exercise machines, check his oxygen and start weaning.       Goals/Expected Outcomes  Short: Become more profiecient at using PLB.   Long: Become independent at using PLB.  Short: decrease oxygen by 1 liter while exercising. Long: Use no oxygen while at rest.         Oxygen Discharge (Final Oxygen Re-Evaluation): Oxygen Re-Evaluation - 11/06/17 1028      Program Oxygen Prescription   Program Oxygen Prescription  Continuous;E-Tanks    Liters per minute  3      Home Oxygen   Home Oxygen Device  Home Concentrator;Portable Concentrator;E-Tanks    Sleep Oxygen Prescription  Continuous;CPAP    Liters per minute  2    Home Exercise Oxygen Prescription  Continuous    Liters per minute  2    Home at Rest Exercise Oxygen Prescription  Continuous    Liters per minute  2    Compliance with Home Oxygen Use  Yes      Goals/Expected Outcomes   Short Term Goals  To learn and exhibit compliance with exercise, home and travel O2 prescription;To learn and understand importance of maintaining oxygen saturations>88%;To learn and demonstrate proper use of respiratory medications;To learn and demonstrate proper pursed lip breathing techniques or other breathing techniques.;To learn and understand importance of monitoring SPO2 with pulse oximeter and demonstrate accurate use of the pulse oximeter.    Long  Term Goals  Exhibits compliance with exercise, home and travel O2 prescription;Verbalizes importance of monitoring SPO2 with pulse oximeter and return demonstration;Maintenance of O2 saturations>88%;Exhibits proper breathing techniques, such as pursed lip breathing or other method taught during program session;Compliance with respiratory medication;Demonstrates proper use of MDI's    Comments  William Bray is taking Pulmicort nebulizer and has an albuterol inhaler. He states when he is resting he uses 1.5-2 liters of oxygen. He checks his oxygen at home and he is 34-96 percent. He wants to be off oxygen all together. We will try to decrease his oxygen by 1 liter on some exercise  machines, check his oxygen and start weaning.    Goals/Expected Outcomes  Short: decrease oxygen by 1 liter while exercising. Long: Use no oxygen while at rest.       Initial Exercise Prescription: Initial Exercise Prescription - 03/24/18 1500      Date of Initial Exercise RX and Referring Provider   Date  03/24/18    Referring Provider  Kelle Darting MD      Oxygen   Oxygen  Continuous    Liters  3      Treadmill   MPH  1.8    Grade  0    Minutes  15    METs  2.38      REL-XR   Level  1    Speed  50    Minutes  15    METs  2      T5 Nustep   Level  1    SPM  80    Minutes  15    METs  2      Prescription Details   Frequency (times per week)  3    Duration  Progress to 45 minutes of aerobic exercise without signs/symptoms of physical distress  Intensity   THRR 40-80% of Max Heartrate  116-141    Ratings of Perceived Exertion  11-13    Perceived Dyspnea  0-4      Progression   Progression  Continue to progress workloads to maintain intensity without signs/symptoms of physical distress.      Resistance Training   Training Prescription  Yes    Weight  3 lbs    Reps  10-15       Perform Capillary Blood Glucose checks as needed.  Exercise Prescription Changes: Exercise Prescription Changes    Row Name 09/30/17 1500 10/15/17 1200 10/30/17 1400 11/06/17 1100 11/13/17 1500     Response to Exercise   Blood Pressure (Admit)  - 80 dopplar  - 84  - 88  - 88  - 90   Blood Pressure (Exercise)  - 112 dopplar  -  -  -  -   Blood Pressure (Exit)  - 114 dopplar, 106 dopplar recheck  - 84  - 82  - 82  - 72   Heart Rate (Admit)  78 bpm  84 bpm  96 bpm  96 bpm  80 bpm   Heart Rate (Exercise)  121 bpm  101 bpm  99 bpm  99 bpm  118 bpm   Heart Rate (Exit)  90 bpm  -  88 bpm  88 bpm  87 bpm   Oxygen Saturation (Admit)  96 %  89 %  98 %  98 %  96 %   Oxygen Saturation (Exercise)  83 %  97 %  96 %  96 %  96 %   Oxygen Saturation (Exit)  90 %  97 %  96 %  96 %  -    Rating of Perceived Exertion (Exercise)  17  12  13  13  14    Perceived Dyspnea (Exercise)  3  2  3  3  3    Symptoms  back pain 8/10, SOB  -  -  -  -   Comments  walk test results  -  -  -  -   Duration  -  Progress to 45 minutes of aerobic exercise without signs/symptoms of physical distress  Progress to 45 minutes of aerobic exercise without signs/symptoms of physical distress  Progress to 45 minutes of aerobic exercise without signs/symptoms of physical distress  Progress to 45 minutes of aerobic exercise without signs/symptoms of physical distress   Intensity  -  THRR unchanged  THRR unchanged  THRR unchanged  THRR unchanged     Progression   Progression  -  Continue to progress workloads to maintain intensity without signs/symptoms of physical distress.  Continue to progress workloads to maintain intensity without signs/symptoms of physical distress.  Continue to progress workloads to maintain intensity without signs/symptoms of physical distress.  Continue to progress workloads to maintain intensity without signs/symptoms of physical distress.     Resistance Training   Training Prescription  -  Yes  Yes  Yes  Yes   Weight  -  3 lb  3 lb  3 lb  3 lb   Reps  -  10-15  10-15  10-15  10-15     Oxygen   Oxygen  -  Continuous  Continuous  Continuous  Continuous   Liters  -  3  3  3  3      Treadmill   MPH  -  -  1.2  1.2  1.3   Grade  -  -  0  0  0   Minutes  -  -  15  15  15    METs  -  -  2  2  2      NuStep   Level  -  1  -  -  3   SPM  -  80  -  -  80   Minutes  -  15  -  -  15   METs  -  -  -  -  1.6     REL-XR   Level  -  -  1  1  3    Speed  -  -  50  50  50   Minutes  -  -  15  15  15    METs  -  -  -  -  1.7     Home Exercise Plan   Plans to continue exercise at  -  -  -  Home (comment) has 3 and 5 lb weights  Home (comment) has 3 and 5 lb weights   Frequency  -  -  -  Add 1 additional day to program exercise sessions.  Add 1 additional day to program exercise sessions.    Initial Home Exercises Provided  -  -  -  11/06/17  11/06/17   Row Name 03/24/18 1500             Response to Exercise   Blood Pressure (Admit)  132/64       Blood Pressure (Exercise)  136/64       Blood Pressure (Exit)  124/62       Heart Rate (Admit)  92 bpm       Heart Rate (Exercise)  132 bpm       Heart Rate (Exit)  108 bpm       Oxygen Saturation (Admit)  95 %       Oxygen Saturation (Exercise)  86 %       Oxygen Saturation (Exit)  90 %       Rating of Perceived Exertion (Exercise)  13       Perceived Dyspnea (Exercise)  2       Symptoms  back and leg pain 8/10, SOB       Comments  walk test results          Exercise Comments: Exercise Comments    Row Name 10/04/17 1047           Exercise Comments   First full day of exercise!  Patient was oriented to gym and equipment including functions, settings, policies, and procedures.  Patient's individual exercise prescription and treatment plan were reviewed.  All starting workloads were established based on the results of the 6 minute walk test done at initial orientation visit.  The plan for exercise progression was also introduced and progression will be customized based on patient's performance and goals          Exercise Goals and Review: Exercise Goals    Row Name 09/30/17 1600 03/24/18 1552           Exercise Goals   Increase Physical Activity  Yes  Yes      Intervention  Provide advice, education, support and counseling about physical activity/exercise needs.;Develop an individualized exercise prescription for aerobic and resistive training based on initial evaluation findings, risk stratification, comorbidities and participant's personal goals.  Provide advice, education, support and counseling about physical activity/exercise needs.;Develop an individualized exercise prescription  for aerobic and resistive training based on initial evaluation findings, risk stratification, comorbidities and participant's personal  goals.      Expected Outcomes  Short Term: Attend rehab on a regular basis to increase amount of physical activity.;Long Term: Add in home exercise to make exercise part of routine and to increase amount of physical activity.;Long Term: Exercising regularly at least 3-5 days a week.  Short Term: Attend rehab on a regular basis to increase amount of physical activity.;Long Term: Add in home exercise to make exercise part of routine and to increase amount of physical activity.;Long Term: Exercising regularly at least 3-5 days a week.      Increase Strength and Stamina  Yes  Yes      Intervention  Provide advice, education, support and counseling about physical activity/exercise needs.;Develop an individualized exercise prescription for aerobic and resistive training based on initial evaluation findings, risk stratification, comorbidities and participant's personal goals.  Provide advice, education, support and counseling about physical activity/exercise needs.;Develop an individualized exercise prescription for aerobic and resistive training based on initial evaluation findings, risk stratification, comorbidities and participant's personal goals.      Expected Outcomes  Short Term: Increase workloads from initial exercise prescription for resistance, speed, and METs.;Short Term: Perform resistance training exercises routinely during rehab and add in resistance training at home;Long Term: Improve cardiorespiratory fitness, muscular endurance and strength as measured by increased METs and functional capacity (6MWT)  Short Term: Increase workloads from initial exercise prescription for resistance, speed, and METs.;Short Term: Perform resistance training exercises routinely during rehab and add in resistance training at home;Long Term: Improve cardiorespiratory fitness, muscular endurance and strength as measured by increased METs and functional capacity (6MWT)      Able to understand and use rate of perceived  exertion (RPE) scale  Yes  Yes      Intervention  Provide education and explanation on how to use RPE scale  Provide education and explanation on how to use RPE scale      Expected Outcomes  Long Term:  Able to use RPE to guide intensity level when exercising independently;Short Term: Able to use RPE daily in rehab to express subjective intensity level  Long Term:  Able to use RPE to guide intensity level when exercising independently;Short Term: Able to use RPE daily in rehab to express subjective intensity level      Able to understand and use Dyspnea scale  Yes  Yes      Intervention  Provide education and explanation on how to use Dyspnea scale  Provide education and explanation on how to use Dyspnea scale      Expected Outcomes  Long Term: Able to use Dyspnea scale to guide intensity level when exercising independently;Short Term: Able to use Dyspnea scale daily in rehab to express subjective sense of shortness of breath during exertion  Long Term: Able to use Dyspnea scale to guide intensity level when exercising independently;Short Term: Able to use Dyspnea scale daily in rehab to express subjective sense of shortness of breath during exertion      Knowledge and understanding of Target Heart Rate Range (THRR)  Yes  Yes      Intervention  Provide education and explanation of THRR including how the numbers were predicted and where they are located for reference  Provide education and explanation of THRR including how the numbers were predicted and where they are located for reference      Expected Outcomes  Short Term: Able to state/look up  THRR;Short Term: Able to use daily as guideline for intensity in rehab;Long Term: Able to use THRR to govern intensity when exercising independently  Short Term: Able to state/look up THRR;Short Term: Able to use daily as guideline for intensity in rehab;Long Term: Able to use THRR to govern intensity when exercising independently      Able to check pulse  independently  Yes  Yes      Intervention  Provide education and demonstration on how to check pulse in carotid and radial arteries.;Review the importance of being able to check your own pulse for safety during independent exercise  Provide education and demonstration on how to check pulse in carotid and radial arteries.;Review the importance of being able to check your own pulse for safety during independent exercise      Expected Outcomes  Short Term: Able to explain why pulse checking is important during independent exercise;Long Term: Able to check pulse independently and accurately  Short Term: Able to explain why pulse checking is important during independent exercise;Long Term: Able to check pulse independently and accurately      Understanding of Exercise Prescription  Yes  Yes      Intervention  Provide education, explanation, and written materials on patient's individual exercise prescription  Provide education, explanation, and written materials on patient's individual exercise prescription      Expected Outcomes  Short Term: Able to explain program exercise prescription;Long Term: Able to explain home exercise prescription to exercise independently  Short Term: Able to explain program exercise prescription;Long Term: Able to explain home exercise prescription to exercise independently         Exercise Goals Re-Evaluation : Exercise Goals Re-Evaluation    [place] Name 10/04/17 1048 10/15/17 1252 10/30/17 1449 11/06/17 1150       Exercise Goal Re-Evaluation   Exercise Goals Review  Increase Physical Activity;Increase Strength and Stamina;Able to understand and use rate of perceived exertion (RPE) scale;Able to understand and use Dyspnea scale;Knowledge and understanding of Target Heart Rate Range (THRR);Understanding of Exercise Prescription  Increase Physical Activity;Able to understand and use rate of perceived exertion (RPE) scale;Increase Strength and Stamina;Able to understand and use  Dyspnea scale  Increase Physical Activity;Increase Strength and Stamina;Able to understand and use rate of perceived exertion (RPE) scale;Able to understand and use Dyspnea scale  Increase Physical Activity;Increase Strength and Stamina;Able to understand and use Dyspnea scale;Able to understand and use rate of perceived exertion (RPE) scale;Knowledge and understanding of Target Heart Rate Range (THRR);Able to check pulse independently    Comments  Reviewed RPE scale, THR and program prescription with pt today.  Pt voiced understanding and was given a copy of goals to take home.   Dylyn is just starting back to exercise.  He is tolerating exercise well and BG has been within acceptable range.    Sloan does the best he can day to day with exercise.  He has missed some sessions due to back pain.  Staff will monitor progress.  Reviewed home exercise - William Bray has had some back pain so we reviewed stretches as well as the strength work to do seated.  Pt voiced understanding.  he has been doing weights at home already.    Expected Outcomes  Short: Use RPE daily to regulate intensity.  Long: Follow program prescription in THR.  Short - pt will attend class 3 days per week Long - Pt will improve MET level   Short - Vashawn will be able to attend regularly [place]  will maintain fitness level  Short - Cash will do strength work on days not at [employer] will maintain exercise on his own       Discharge Exercise Prescription (Final Exercise Prescription Changes): Exercise Prescription Changes - 03/24/18 1500      Response to Exercise   Blood Pressure (Admit)  132/64    Blood Pressure (Exercise)  136/64    Blood Pressure (Exit)  124/62    Heart Rate (Admit)  92 bpm    Heart Rate (Exercise)  132 bpm    Heart Rate (Exit)  108 bpm    Oxygen Saturation (Admit)  95 %    Oxygen Saturation (Exercise)  86 %    Oxygen Saturation (Exit)  90 %    Rating of Perceived Exertion (Exercise)  13    Perceived Dyspnea  (Exercise)  2    Symptoms  back and leg pain 8/10, SOB    Comments  walk test results       Nutrition:  Target Goals: Understanding of nutrition guidelines, daily intake of sodium <1549m, cholesterol <2053m calories 30% from fat and 7% or less from saturated fats, daily to have 5 or more servings of fruits and vegetables.  Biometrics: Pre Biometrics - 03/24/18 1553      Pre Biometrics   Height  5' 9"  (1.753 m)    Weight  200 lb 3.2 oz (90.8 kg)    Waist Circumference  42.5 inches    Hip Circumference  42 inches    Waist to Hip Ratio  1.01 %    BMI (Calculated)  29.55    Single Leg Stand  0.73 seconds        Nutrition Therapy Plan and Nutrition Goals: Nutrition Therapy & Goals - 03/24/18 1448      Intervention Plan   Intervention  Prescribe, educate and counsel regarding individualized specific dietary modifications aiming towards targeted core components such as weight, hypertension, lipid management, diabetes, heart failure and other comorbidities.    Expected Outcomes  Short Term Goal: Understand basic principles of dietary content, such as calories, fat, sodium, cholesterol and nutrients.;Short Term Goal: A plan has been developed with personal nutrition goals set during dietitian appointment.;Long Term Goal: Adherence to prescribed nutrition plan.       Nutrition Assessments: Nutrition Assessments - 09/30/17 1427      MEDFICTS Scores   Pre Score  30       Nutrition Goals Re-Evaluation: Nutrition Goals Re-Evaluation    [place] Name 10/16/17 1023 10/16/17 1024 11/06/17 1035         Goals   Current Weight  -  -  189 lb (85.7 kg)     Nutrition Goal  If you continue to find yourself without an appetite, consuming a diet higher in fat can help you to consume more calories with a smaller volume of food  -  William Bray to weigh 180 pounds without his LVAD. Do not over eat.     Comment  He c/o decreased appetite  -  William Bray he is eating too much food. He met with the  dietician but states he eats too many vegatables. One doctor tells him that he needs to lose weight and one that says he needs to gain weight.     Expected Outcome  He will eat foods high in fat around the same times each day in order to maximize his caloric intake  -  Short: Eat healthy food and not over  eat. Long: Obtain a healthy weight.       Personal Goal #2 Re-Evaluation   Personal Goal #2  -  Eat on a consistent schedule daily in order to stimulate hunger/ appetite  -        Nutrition Goals Discharge (Final Nutrition Goals Re-Evaluation): Nutrition Goals Re-Evaluation - 11/06/17 1035      Goals   Current Weight  189 lb (85.7 kg)    Nutrition Goal  William Bray wants to weigh 180 pounds without his LVAD. Do not over eat.    Comment  William Bray states he is eating too much food. He met with the dietician but states he eats too many vegatables. One doctor tells him that he needs to lose weight and one that says he needs to gain weight.    Expected Outcome  Short: Eat healthy food and not over eat. Long: Obtain a healthy weight.       Psychosocial: Target Goals: Acknowledge presence or absence of significant depression and/or stress, maximize coping skills, provide positive support system. Participant is able to verbalize types and ability to use techniques and skills needed for reducing stress and depression.   Initial Review & Psychosocial Screening: Initial Psych Review & Screening - 03/24/18 1446      Initial Review   Current issues with  History of Depression;Current Psychotropic Meds;Current Stress Concerns;Current Sleep Concerns    Source of Stress Concerns  Chronic Illness;Unable to perform yard/household activities    Comments  Changed pysch meds since last seen, waking up frequently at night, newly diagnosis lung cancer, breathing continues to be a struggle      [place]?  Yes    Comments  William Bray has great support from his wife, daughter, and son.         Barriers   Psychosocial barriers to participate in program  Psychosocial barriers identified (see note);The patient should benefit from training in stress management and relaxation.      Screening Interventions   Interventions  Encouraged to exercise;Provide feedback about the scores to participant;Program counselor consult;To provide support and resources with identified psychosocial needs    Expected Outcomes  Short Term goal: Utilizing psychosocial counselor, staff and physician to assist with identification of specific Stressors or current issues interfering with healing process. Setting desired goal for each stressor or current issue identified.;Long Term Goal: Stressors or current issues are controlled or eliminated.;Short Term goal: Identification and review with participant of any Quality of Life or Depression concerns found by scoring the questionnaire.;Long Term goal: The participant improves quality of Life and PHQ9 Scores as seen by post scores and/or verbalization of changes       Quality of Life Scores:  Scores of 19 and below usually indicate a poorer quality of life in these areas.  A difference of  2-3 points is a clinically meaningful difference.  A difference of 2-3 points in the total score of the Quality of Life Index has been associated with significant improvement in overall quality of life, self-image, physical symptoms, and general health in studies assessing change in quality of life.  PHQ-9: Recent Review Flowsheet Data    Depression screen [facility] 2/9 03/24/2018 12/11/2017 11/20/2017 10/22/2017 09/30/2017   Decreased Interest 1 0 0 2 2   Down, Depressed, Hopeless 1 1 0 2 2   PHQ - 2 Score 2 1 0 4 4   Altered sleeping 1 0 - 2 2   Tired, decreased energy 3  1 - 2 2   Change in appetite 1  1 - 2 2   Feeling bad or failure about yourself  0 0 - 2 1   Trouble concentrating 0 0 - 2 1   Moving slowly or fidgety/restless 0 0 - 2 1   Suicidal thoughts 0 0 - 0 0   PHQ-9 Score 7 3  - 16 13   Difficult doing work/chores Very difficult Not difficult at all - Somewhat difficult Very difficult     Interpretation of Total Score  Total Score Depression Severity:  1-4 = Minimal depression, 5-9 = Mild depression, 10-14 = Moderate depression, 15-19 = Moderately severe depression, 20-27 = Severe depression   Psychosocial Evaluation and Intervention: Psychosocial Evaluation - 10/16/17 1023      Psychosocial Evaluation & Interventions   Comments  William Bray has returned to Pulmonary Rehab after being in the hospital for several months late December/early January.  He is a 67 year old who has multiple health issues with CHF and COPD primarily along with severe back problems.  William Bray has a strong support system with a spouse; sisters and a sister-in-law; a son and a daughter locally.  He reports not sleeping great with maybe 5-6 hours of sleep per night.  His appetite is "fine" currently.  William Bray reports a history of anxiety with some symptoms present several times each day.  He is on medication for this that helps somewhat.  William Bray reports being in a positive mood most of the time and his health is his primary stressor.  He has goals to get stronger and breathe better overall.  Staff will follow with William Bray     Expected Outcomes  Short:  Nyjah will exercise to reduce his anxiety symptoms.   Long:  Lonn will exercise consistently to recover his stamina and strength and breathe better overall.      Continue Psychosocial Services   Follow up required by staff       Psychosocial Re-Evaluation: Psychosocial Re-Evaluation    [place] Name 10/14/17 1341 11/06/17 1046           Psychosocial Re-Evaluation   Current issues with  -  Current Sleep Concerns;Current Stress Concerns;Current Depression;History of Depression      Comments  William Bray has just joined the prgram after a year put. He had been in the Maintenance classes and his MD wanted him in Pulmonary Rehab. He is struggling with increased SOB today, his  weight is up 9 pounds over a week.  He called his LVAD team and reviewed this info with them. He will keep them informed of how his weight changes.  William Bray recently was placed on Prednisone, and the staff did explain how it can make him retain fluid, it was emphasized that he should be weiging himself daily and reporting 2 0r more weight gain to the LVAD team. He is upset that he is more SOB today and wants to feel better.  He was told to gain some weight and thought the weight gain was form eating.  Will continue to followup with William Bray and encourage him to closely watch his weight., as the more contol of keeping the excess fluid will help with the SOB and his ability to keep up the exercise, which wil help his stamina increase and overall feeling better-whichis his ultimate goal.    Seymour cant do what he wants to do, he does not like being handicapped. Ever since his health went down hill he has been more depressed.  He states he is depressed about everything.       Expected Outcomes  -  Short: Attened [name] regularly to decrease stress. Long: graduate [name] to keep stress at a minimum.      Interventions  Therapist referral;Encouraged to attend Pulmonary Rehabilitation for the exercise;Relaxation education  Therapist referral;Encouraged to attend Pulmonary Rehabilitation for the exercise;Relaxation education      Continue Psychosocial Services   Follow up required by counselor  Follow up required by staff         Psychosocial Discharge (Final Psychosocial Re-Evaluation): Psychosocial Re-Evaluation - 11/06/17 1046      Psychosocial Re-Evaluation   Current issues with  Current Sleep Concerns;Current Stress Concerns;Current Depression;History of Depression    Comments  William Bray cant do what he wants to do, he does not like being handicapped. Ever since his health went down hill he has been more depressed. He states he is depressed about everything.     Expected Outcomes  Short: Attened [name] regularly  to decrease stress. Long: graduate [name] to keep stress at a minimum.    Interventions  Therapist referral;Encouraged to attend Pulmonary Rehabilitation for the exercise;Relaxation education    Continue Psychosocial Services   Follow up required by staff       Education: Education Goals: Education classes will be provided on a weekly basis, covering required topics. Participant will state understanding/return demonstration of topics presented.  Learning Barriers/Preferences: Learning Barriers/Preferences - 03/24/18 1448      Learning Barriers/Preferences   Learning Barriers  Sight;Hearing   glasses and hearing aids   Learning Preferences  None       Education Topics:  Initial Evaluation Education: - Verbal, written and demonstration of respiratory meds, oximetry and breathing techniques. Instruction on use of nebulizers and MDIs and importance of monitoring MDI activations.   Pulmonary Rehab from 03/24/2018 in [facility] Cardiac and Pulmonary Rehab  Date  03/24/18  Educator  [facility]  Instruction Review Code  1- Verbalizes Understanding      General Nutrition Guidelines/Fats and Fiber: -Group instruction provided by verbal, written material, models and posters to present the general guidelines for heart healthy nutrition. Gives an explanation and review of dietary fats and fiber.   Pulmonary Rehab from 10/23/2017 in [facility] Cardiac and Pulmonary Rehab  Date  10/14/17  Educator  CR  Instruction Review Code  1- Verbalizes Understanding      Controlling Sodium/Reading Food Labels: -Group verbal and written material supporting the discussion of sodium use in heart healthy nutrition. Review and explanation with models, verbal and written materials for utilization of the food label.   Exercise Physiology & General Exercise Guidelines: - Group verbal and written instruction with models to review the exercise physiology of the cardiovascular system and associated critical values. Provides  general exercise guidelines with specific guidelines to those with heart or lung disease.    Aerobic Exercise & Resistance Training: - Gives group verbal and written instruction on the various components of exercise. Focuses on aerobic and resistive training programs and the benefits of this training and how to safely progress through these programs.   Cardiac Rehab from 03/21/2016 in [facility] Cardiac and Pulmonary Rehab  Date  02/29/16  Educator  [facility]  Instruction Review Code (retired)  2- Statistician, Balance, Mind/Body Relaxation: Provides group verbal/written instruction on the benefits of flexibility and balance training, including mind/body exercise modes such as yoga, pilates and tai chi.  Demonstration and skill practice provided.  Cardiac Rehab from 03/21/2016 in [facility] Cardiac and Pulmonary Rehab  Date  01/09/16  Educator  [facility]  Instruction Review Code (retired)  2- meets goals/outcomes      Stress and Anxiety: - Provides group verbal and written instruction about the health risks of elevated stress and causes of high stress.  Discuss the correlation between heart/lung disease and anxiety and treatment options. Review healthy ways to manage with stress and anxiety.   Pulmonary Rehab from 10/23/2017 in [facility] Cardiac and Pulmonary Rehab  Date  10/23/17  Educator  [facility]  Instruction Review Code  1- Verbalizes Understanding      Depression: - Provides group verbal and written instruction on the correlation between heart/lung disease and depressed mood, treatment options, and the stigmas associated with seeking treatment.   Exercise & Equipment Safety: - Individual verbal instruction and demonstration of equipment use and safety with use of the equipment.   Pulmonary Rehab from 03/24/2018 in [facility] Cardiac and Pulmonary Rehab  Date  03/24/18  Educator  [facility]  Instruction Review Code  1- Verbalizes Understanding      Infection Prevention: - Provides verbal and  written material to individual with discussion of infection control including proper hand washing and proper equipment cleaning during exercise session.   Pulmonary Rehab from 03/24/2018 in [facility] Cardiac and Pulmonary Rehab  Date  03/24/18  Educator  [facility]  Instruction Review Code  1- Verbalizes Understanding      Falls Prevention: - Provides verbal and written material to individual with discussion of falls prevention and safety.   Pulmonary Rehab from 03/24/2018 in [facility] Cardiac and Pulmonary Rehab  Date  03/24/18  Educator  [facility]  Instruction Review Code  1- Verbalizes Understanding      Diabetes: - Individual verbal and written instruction to review signs/symptoms of diabetes, desired ranges of glucose level fasting, after meals and with exercise. Advice that pre and post exercise glucose checks will be done for 3 sessions at entry of program.   Cardiac Rehab from 03/21/2016 in [facility] Cardiac and Pulmonary Rehab  Date  01/02/16  Educator  D. Joya Gaskins, RN  Instruction Review Code (retired)  2- meets goals/outcomes      Chronic Lung Diseases: - Group verbal and written instruction to review updates, respiratory medications, advancements in procedures and treatments. Discuss use of supplemental oxygen including available portable oxygen systems, continuous and intermittent flow rates, concentrators, personal use and safety guidelines. Review proper use of inhaler and spacers. Provide informative websites for self-education.    Energy Conservation: - Provide group verbal and written instruction for methods to conserve energy, plan and organize activities. Instruct on pacing techniques, use of adaptive equipment and posture/positioning to relieve shortness of breath.   Pulmonary Rehab from 10/23/2017 in [facility] Cardiac and Pulmonary Rehab  Date  10/16/17  Educator  [facility]  Instruction Review Code  1- Verbalizes Understanding      Triggers and Exacerbations: - Group verbal and written instruction  to review types of environmental triggers and ways to prevent exacerbations. Discuss weather changes, air quality and the benefits of nasal washing. Review warning signs and symptoms to help prevent infections. Discuss techniques for effective airway clearance, coughing, and vibrations.   AED/CPR: - Group verbal and written instruction with the use of models to demonstrate the basic use of the AED with the basic ABC's of resuscitation.   Pulmonary Rehab from 10/23/2017 in [facility] Cardiac and Pulmonary Rehab  Date  10/18/17  Educator  [place]  Instruction Review Code  1- Actuary and Physiology of the Lungs: - Group verbal and written instruction with the use of models to provide basic lung anatomy and physiology related to function, structure and complications of lung disease.   Anatomy & Physiology of the Heart: - Group verbal and written instruction and models provide basic cardiac anatomy and physiology, with the coronary electrical and arterial systems. Review of Valvular disease and Heart Failure   Cardiac Rehab from 03/21/2016 in [facility] Cardiac and Pulmonary Rehab  Date  01/16/16  Educator  [facility]  Instruction Review Code (retired)  2- meets goals/outcomes      Cardiac Medications: - Group verbal and written instruction to review commonly prescribed medications for heart disease. Reviews the medication, class of the drug, and side effects.   Pulmonary Rehab from 10/23/2017 in [facility] Cardiac and Pulmonary Rehab  Date  10/04/17  Educator  [facility]  Instruction Review Code  1- Verbalizes Understanding      Know Your Numbers and Risk Factors: -Group verbal and written instruction about important numbers in your health.  Discussion of what are risk factors and how they play a role in the disease process.  Review of Cholesterol, Blood Pressure, Diabetes, and BMI and the role they play in your overall health.   Sleep Hygiene: -Provides group verbal and written instruction  about how sleep can affect your health.  Define sleep hygiene, discuss sleep cycles and impact of sleep habits. Review good sleep hygiene tips.    Other: -Provides group and verbal instruction on various topics (see comments)    Knowledge Questionnaire Score: Knowledge Questionnaire Score - 09/30/17 1436      Knowledge Questionnaire Score   Pre Score  15/18   reviewed with patient       Core Components/Risk Factors/Patient Goals at Admission: Personal Goals and Risk Factors at Admission - 03/24/18 1448      Core Components/Risk Factors/Patient Goals on Admission    Weight Management  Yes;Weight Maintenance;Weight Loss    Intervention  Weight Management: Develop a combined nutrition and exercise program designed to reach desired caloric intake, while maintaining appropriate intake of nutrient and fiber, sodium and fats, and appropriate energy expenditure required for the weight goal.;Weight Management: Provide education and appropriate resources to help participant work on and attain dietary goals.;Weight Management/Obesity: Establish reasonable short term and long term weight goals.;Obesity: Provide education and appropriate resources to help participant work on and attain dietary goals.    Admit Weight  200 lb 3.2 oz (90.8 kg)    Goal Weight: Short Term  195 lb (88.5 kg)    Goal Weight: Long Term  185 lb (83.9 kg)    Expected Outcomes  Short Term: Continue to assess and modify interventions until short term weight is achieved;Long Term: Adherence to nutrition and physical activity/exercise program aimed toward attainment of established weight goal;Weight Loss: Understanding of general recommendations for a balanced deficit meal plan, which promotes 1-2 lb weight loss per week and includes a negative energy balance of [phone] kcal/d;Understanding recommendations for meals to include 15-35% energy as protein, 25-35% energy from fat, 35-60% energy from carbohydrates, less than 298m of  dietary cholesterol, 20-35 gm of total fiber daily;Understanding of distribution of calorie intake throughout the day with the consumption of 4-5 meals/snacks    Improve shortness of breath with ADL's  Yes    Intervention  Provide education, individualized exercise plan and daily activity instruction to help decrease symptoms of SOB with activities of  daily living.    Expected Outcomes  Short Term: Improve cardiorespiratory fitness to achieve a reduction of symptoms when performing ADLs;Long Term: Be able to perform more ADLs without symptoms or delay the onset of symptoms    Heart Failure  Yes    Intervention  Provide a combined exercise and nutrition program that is supplemented with education, support and counseling about heart failure. Directed toward relieving symptoms such as shortness of breath, decreased exercise tolerance, and extremity edema.    Expected Outcomes  Improve functional capacity of life;Short term: Attendance in program 2-3 days a week with increased exercise capacity. Reported lower sodium intake. Reported increased fruit and vegetable intake. Reports medication compliance.;Short term: Daily weights obtained and reported for increase. Utilizing diuretic protocols set by physician.;Long term: Adoption of self-care skills and reduction of barriers for early signs and symptoms recognition and intervention leading to self-care maintenance.    Hypertension  Yes    Intervention  Provide education on lifestyle modifcations including regular physical activity/exercise, weight management, moderate sodium restriction and increased consumption of fresh fruit, vegetables, and low fat dairy, alcohol moderation, and smoking cessation.;Monitor prescription use compliance.    Expected Outcomes  Short Term: Continued assessment and intervention until BP is < 140/63m HG in hypertensive participants. < 130/813mHG in hypertensive participants with diabetes, heart failure or chronic kidney  disease.;Long Term: Maintenance of blood pressure at goal levels.    Lipids  Yes    Intervention  Provide education and support for participant on nutrition & aerobic/resistive exercise along with prescribed medications to achieve LDL <7071mHDL >30m20m  Expected Outcomes  Short Term: Participant states understanding of desired cholesterol values and is compliant with medications prescribed. Participant is following exercise prescription and nutrition guidelines.;Long Term: Cholesterol controlled with medications as prescribed, with individualized exercise RX and with personalized nutrition plan. Value goals: LDL < 70mg94mL > 40 mg.       Core Components/Risk Factors/Patient Goals Review:  Goals and Risk Factor Review    Row Name 10/14/17 1350 11/06/17 1020           Core Components/Risk Factors/Patient Goals Review   Personal Goals Review  Weight Management/Obesity;Heart Failure;Improve shortness of breath with ADL's;Diabetes;Hypertension  Weight Management/Obesity;Heart Failure;Improve shortness of breath with ADL's;Diabetes;Hypertension      Review  William Bray Readrecently started the program. Today he was presenting with incresed SOB and weight gain.  After a call to his LVAD team , Malaquias Tyreesea game plan with his diurectics to work on the fluid overload. He is taking all his meds and checking his blood sugar levels at home.   His AM fasting BS is 140-200 per William Bray.William Bray will report this to his MD.    Eoghan Antionebeen checking his blood sugar at home and has been in the 140s-150-s fasting. His weight goal is to be 180 pounds. He is able to take a shower by himself, dress himself and do house work without any issues. His blood pressure has been 70s-80s and stable. 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 His back and his artheritis have been giving him issues. He is trying to get into the pain clinic and had his first appointment last week or so.      Expected Outcomes  ST: Coby Shishir improve the SOB symptoms as he  gains control of excess fluid. He will speak to his MD about his blood Glucose elvels and he will continue to attend the program to  work on buliding his stamina and strength. LT Keison will be able to know when to call his MD about his weight gain and will continue to keep other risk factors under control.   Short: work with the pain clinis to manage his back pain. Long: obtain treatment to help with his back and artheritis.         Core Components/Risk Factors/Patient Goals at Discharge (Final Review):  Goals and Risk Factor Review - 11/06/17 1020      Core Components/Risk Factors/Patient Goals Review   Personal Goals Review  Weight Management/Obesity;Heart Failure;Improve shortness of breath with ADL's;Diabetes;Hypertension    Review  William Bray has been checking his blood sugar at home and has been in the 140s-150-s fasting. His weight goal is to be 180 pounds. He is able to take a shower by himself, dress himself and do house work without any issues. His blood pressure has been 70s-80s and stable. 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 His back and his artheritis have been giving him issues. He is trying to get into the pain clinic and had his first appointment last week or so.    Expected Outcomes  Short: work with the pain clinis to manage his back pain. Long: obtain treatment to help with his back and artheritis.       ITP Comments: ITP Comments    Row Name 09/30/17 1400 10/14/17 1122 10/14/17 1339 10/18/17 1147 03/24/18 1534   ITP Comments  Medical Evaluation completed. Chart sent for review and changes to Dr. Emily Filbert Director of [place]. Diagnosis can be found in [facility] encounter 09/16/17  Patient arrived and had gained 9 lbs in 7 days and was short of breath. We was advised by staff not to exercise and to call his LVAD team at [facility]. He called his his team and they cleared him to exercise over the phone. He did exercise at a very light intensity and tolerated it well.   Patient arrived and had gained 9  lbs in 7 days and was short of breath. We was advised by staff not to exercise and to call his LVAD team at [facility]. He called his his team and they cleared him to exercise over the phone. He did exercise at a very light intensity and tolerated it well.    Pt's BP was 64. Patient reported lightheadedness and weakness. He reported that he usually takes lisinopril 79m daily at night. He recently refilled his lisinopril and realized that the dose was 269minstead of 1071mHe checked his medication list and noticed it is 65m46m he took 65mg59mt night but he is not sure when the dose was changed to 65mg.88mwas asked to call his PCP to verify the right dose. PCP verbalized that dose is 10mg n106m0mg. P1munk 2 glasses of water. Will continue to monitor patient's status.   Medical evaluation completed today.  Pt started on visit 15 today.  New ITP created and sent for review to Dr. Mark MilEmily Filbertl Director.  Documentation for diagnosis from [facility] encounter 03/18/18 and 09/16/17.      Comments: Initial ITP  Restart

## 2018-03-24 NOTE — Progress Notes (Signed)
Daily Session Note  Patient Details  Name: William Bray MRN: [id number] Date of Birth: 05-14-1951 Referring Provider:     Pulmonary Rehab from 03/24/2018 in [facility] Cardiac and Pulmonary Rehab  Referring Provider  Kelle Darting MD      Encounter Date: 03/24/2018  Check In: Session Check In - 03/24/18 1435      Check-In   Supervising physician immediately available to respond to emergencies  [name] immediately available ER MD    Physician(s)  Drs. Domenic Polite    Location  [facility]-Cardiac & Pulmonary Rehab    Staff Present  [name], MA, RCEP, CCRP, Exercise Physiologist;Joseph Tessie Fass RCP,RRT,BSRT    Medication changes reported      No    Fall or balance concerns reported     No    Warm-up and Cool-down  Not performed (comment)   walk test and medical evaluation   Resistance Training Performed  Yes    VAD Patient?  Yes    PAD/SET Patient?  No      VAD patient   Has back up controller?  Yes    Has spare charged batteries?  Yes    Has battery cables?  Yes    Has compatible battery clips?  Yes      Pain Assessment   Currently in Pain?  No/denies        Exercise Prescription Changes - 03/24/18 1500      Response to Exercise   Blood Pressure (Admit)  132/64    Blood Pressure (Exercise)  136/64    Blood Pressure (Exit)  124/62    Heart Rate (Admit)  92 bpm    Heart Rate (Exercise)  132 bpm    Heart Rate (Exit)  108 bpm    Oxygen Saturation (Admit)  95 %    Oxygen Saturation (Exercise)  86 %    Oxygen Saturation (Exit)  90 %    Rating of Perceived Exertion (Exercise)  13    Perceived Dyspnea (Exercise)  2    Symptoms  back and leg pain 8/10, SOB    Comments  walk test results       Social History   Tobacco Use  Smoking Status Former Smoker  . Packs/day: 1.00  . Years: 45.00  . Pack years: 45.00  . Types: Cigarettes  . Last attempt to quit: 05/28/2005  . Years since quitting: 12.8  Smokeless Tobacco Never Used    Goals Met:  Proper  associated with RPD/PD & O2 Sat Exercise tolerated well Personal goals reviewed No report of cardiac concerns or symptoms Strength training completed today  Goals Unmet:  Not Applicable  Comments: Medical evaluation and walk test completed.  Service time 1420-1500   Dr. Emily Filbert is Medical Director for [place] and [name].

## 2018-03-26 ENCOUNTER — Encounter: Payer: Medicare Other | Admitting: *Deleted

## 2018-03-26 DIAGNOSIS — J449 Chronic obstructive pulmonary disease, unspecified: Secondary | ICD-10-CM

## 2018-03-26 NOTE — Progress Notes (Signed)
Daily Session Note  Patient Details  Name: William Bray MRN: [id number] Date of Birth: 08/24/1950 Referring Provider:     Pulmonary Rehab from 03/24/2018 in [facility] Cardiac and Pulmonary Rehab  Referring Provider  Kelle Darting MD      Encounter Date: 03/26/2018  Check In: Session Check In - 03/26/18 1056      Check-In   Supervising physician immediately available to respond to emergencies  [name] immediately available ER MD    Physician(s)  Drs. Lord and [employer]    Location  [facility]-Cardiac & Pulmonary Rehab    Staff Present  [name], MA, RCEP, CCRP, Exercise Physiologist;Joseph [employer], [place], ACSM CEP, Exercise Physiologist    Medication changes reported      No    Fall or balance concerns reported     No    Warm-up and Cool-down  Performed as group-led instruction    Resistance Training Performed  Yes    VAD Patient?  Yes    PAD/SET Patient?  No      VAD patient   Has back up controller?  Yes    Has spare charged batteries?  Yes    Has battery cables?  Yes    Has compatible battery clips?  Yes      Pain Assessment   Currently in Pain?  No/denies          Social History   Tobacco Use  Smoking Status Former Smoker  . Packs/day: 1.00  . Years: 45.00  . Pack years: 45.00  . Types: Cigarettes  . Last attempt to quit: 05/28/2005  . Years since quitting: 12.8  Smokeless Tobacco Never Used    Goals Met:  Proper associated with RPD/PD & O2 Sat Exercise tolerated well Personal goals reviewed Queuing for purse lip breathing No report of cardiac concerns or symptoms Strength training completed today  Goals Unmet:  Not Applicable  Comments: First full day of exercise!  Patient was oriented to gym and equipment including functions, settings, policies, and procedures.  Patient's individual exercise prescription and treatment plan were reviewed.  All starting workloads were established based on the results of the 6 minute walk test done  at initial orientation visit.  The plan for exercise progression was also introduced and progression will be customized based on patient's performance and goals.    Dr. Emily Filbert is Medical Director for [place] and [name].

## 2018-03-28 ENCOUNTER — Encounter: Payer: Medicare Other | Attending: Pulmonary Disease

## 2018-03-28 DIAGNOSIS — I10 Essential (primary) hypertension: Secondary | ICD-10-CM | POA: Insufficient documentation

## 2018-03-28 DIAGNOSIS — Z79891 Long term (current) use of opiate analgesic: Secondary | ICD-10-CM | POA: Insufficient documentation

## 2018-03-28 DIAGNOSIS — Z7901 Long term (current) use of anticoagulants: Secondary | ICD-10-CM | POA: Insufficient documentation

## 2018-03-28 DIAGNOSIS — J449 Chronic obstructive pulmonary disease, unspecified: Secondary | ICD-10-CM | POA: Insufficient documentation

## 2018-03-28 DIAGNOSIS — Z7982 Long term (current) use of aspirin: Secondary | ICD-10-CM | POA: Insufficient documentation

## 2018-03-28 DIAGNOSIS — Z79899 Other long term (current) drug therapy: Secondary | ICD-10-CM | POA: Insufficient documentation

## 2018-03-28 DIAGNOSIS — Z87891 Personal history of nicotine dependence: Secondary | ICD-10-CM | POA: Insufficient documentation

## 2018-03-28 DIAGNOSIS — E119 Type 2 diabetes mellitus without complications: Secondary | ICD-10-CM | POA: Insufficient documentation

## 2018-03-31 DIAGNOSIS — E119 Type 2 diabetes mellitus without complications: Secondary | ICD-10-CM | POA: Diagnosis not present

## 2018-03-31 DIAGNOSIS — Z79899 Other long term (current) drug therapy: Secondary | ICD-10-CM | POA: Diagnosis not present

## 2018-03-31 DIAGNOSIS — Z79891 Long term (current) use of opiate analgesic: Secondary | ICD-10-CM | POA: Diagnosis not present

## 2018-03-31 DIAGNOSIS — Z7901 Long term (current) use of anticoagulants: Secondary | ICD-10-CM | POA: Diagnosis not present

## 2018-03-31 DIAGNOSIS — J449 Chronic obstructive pulmonary disease, unspecified: Secondary | ICD-10-CM

## 2018-03-31 DIAGNOSIS — I10 Essential (primary) hypertension: Secondary | ICD-10-CM | POA: Diagnosis not present

## 2018-03-31 DIAGNOSIS — Z87891 Personal history of nicotine dependence: Secondary | ICD-10-CM | POA: Diagnosis not present

## 2018-03-31 DIAGNOSIS — Z7982 Long term (current) use of aspirin: Secondary | ICD-10-CM | POA: Diagnosis not present

## 2018-03-31 NOTE — Progress Notes (Signed)
Daily Session Note  Patient Details  Name: William Bray MRN: [id number] Date of Birth: [birth year] Referring Provider:     Pulmonary Rehab from 03/24/2018 in [facility] Cardiac and Pulmonary Rehab  Referring Provider  Kelle Darting MD      Encounter Date: 03/31/2018  Check In: Session Check In - 03/31/18 1345      Check-In   Supervising physician immediately available to respond to emergencies  [name] immediately available ER MD    Physician(s)  Dr. Corky Downs and [facility]    Location  [facility]-Cardiac & Pulmonary Rehab    Staff Present  Justin Mend RCP,RRT,BSRT;Kelly Amedeo Plenty, BS, ACSM CEP, Exercise Physiologist    Medication changes reported      No    Fall or balance concerns reported     No    Warm-up and Cool-down  Performed as group-led instruction    Resistance Training Performed  Yes    VAD Patient?  Yes    PAD/SET Patient?  No      VAD patient   Has back up controller?  Yes    Has spare charged batteries?  Yes    Has battery cables?  Yes    Has compatible battery clips?  Yes      Pain Assessment   Currently in Pain?  Yes    Pain Score  6     Pain Location  Back    Pain Orientation  Lower;Mid    Pain Type  Chronic pain    Multiple Pain Sites  No          Social History   Tobacco Use  Smoking Status Former Smoker  . Packs/day: 1.00  . Years: 45.00  . Pack years: 45.00  . Types: Cigarettes  . Last attempt to quit: 05/28/2005  . Years since quitting: 12.8  Smokeless Tobacco Never Used    Goals Met:  Independence with exercise equipment Exercise tolerated well No report of cardiac concerns or symptoms Strength training completed today  Goals Unmet:  Not Applicable  Comments: Pt able to follow exercise prescription today without complaint.  Will continue to monitor for progression.    Dr. Emily Filbert is Medical Director for [place] and [name].

## 2018-04-14 ENCOUNTER — Encounter: Payer: Medicare Other | Admitting: *Deleted

## 2018-04-14 DIAGNOSIS — I5022 Chronic systolic (congestive) heart failure: Secondary | ICD-10-CM

## 2018-04-14 DIAGNOSIS — J449 Chronic obstructive pulmonary disease, unspecified: Secondary | ICD-10-CM

## 2018-04-14 DIAGNOSIS — Z95811 Presence of heart assist device: Secondary | ICD-10-CM

## 2018-04-14 NOTE — Progress Notes (Signed)
Daily Session Note  Patient Details  Name: William Bray MRN: [id number] Date of Birth: 09/22/1950 Referring Provider:     Pulmonary Rehab from 03/24/2018 in [facility] Cardiac and Pulmonary Rehab  Referring Provider  Kelle Darting MD      Encounter Date: 04/14/2018  Check In: Session Check In - 04/14/18 1009      Check-In   Supervising physician immediately available to respond to emergencies  [name] immediately available ER MD    Physician(s)  Dr. Mariea Clonts and Dr. Cherylann Banas    Location  [facility]-Cardiac & Pulmonary Rehab    Staff Present  Earlean Shawl, BS, ACSM CEP, Exercise Physiologist;Joseph Foy [place], [place], ACSM CEP, Exercise Physiologist    Fall or balance concerns reported     No    Tobacco Cessation  No Change    Warm-up and Cool-down  Performed as group-led instruction    Resistance Training Performed  Yes    VAD Patient?  Yes    PAD/SET Patient?  No      VAD patient   Has back up controller?  Yes    Has spare charged batteries?  Yes    Has battery cables?  Yes    Has compatible battery clips?  Yes      Pain Assessment   Currently in Pain?  No/denies    Multiple Pain Sites  No          Social History   Tobacco Use  Smoking Status Former Smoker  . Packs/day: 1.00  . Years: 45.00  . Pack years: 45.00  . Types: Cigarettes  . Last attempt to quit: 05/28/2005  . Years since quitting: 12.8  Smokeless Tobacco Never Used    Goals Met:  Proper associated with RPD/PD & O2 Sat Independence with exercise equipment Exercise tolerated well No report of cardiac concerns or symptoms Strength training completed today  Goals Unmet:  Not Applicable  Comments: Pt able to follow exercise prescription today without complaint.  Will continue to monitor for progression.    Dr. Emily Filbert is Medical Director for [place] and [name].

## 2018-04-14 NOTE — Progress Notes (Signed)
Pulmonary Individual Treatment Plan  Patient Details  Name: William Bray MRN: [id number] Date of Birth: 1950-12-02 Referring Provider:     Pulmonary Rehab from 03/24/2018 in [facility] Cardiac and Pulmonary Rehab  Referring Provider  Kelle Darting MD      Initial Encounter Date:    Pulmonary Rehab from 03/24/2018 in [facility] Cardiac and Pulmonary Rehab  Date  03/24/18      Visit Diagnosis: Chronic obstructive pulmonary disease, unspecified COPD type ([place])  Patient's Home Medications on Admission:  Current Outpatient Medications:  .  albuterol (PROVENTIL) (2.5 MG/3ML) 0.083% nebulizer solution, Inhale 3 mLs into the lungs every 6 (six) hours as needed., Disp: , Rfl:  .  allopurinol (ZYLOPRIM) 100 MG tablet, Take 100 mg by mouth daily., Disp: , Rfl:  .  ALPRAZolam (XANAX) 0.5 MG tablet, Take 0.5 mg by mouth 2 (two) times daily as needed. ONE IN THE MORNING AND ONE AT BEDTIME PRN, Disp: , Rfl:  .  aspirin EC 81 MG tablet, Take 81 mg by mouth daily., Disp: , Rfl:  .  budesonide (PULMICORT) 0.5 MG/2ML nebulizer solution, Inhale 2 mLs into the lungs 2 (two) times daily., Disp: , Rfl:  .  cetirizine (ZYRTEC) 10 MG tablet, Take 10 mg by mouth daily., Disp: , Rfl:  .  cyanocobalamin 100 MCG tablet, Take 100 mcg by mouth daily., Disp: , Rfl:  .  ferrous sulfate 324 (65 Fe) MG TBEC, Take 324 mg by mouth every morning. WITH BREAKFAST, Disp: , Rfl:  .  FLUoxetine (PROZAC) 20 MG capsule, Take 80 mg by mouth daily., Disp: , Rfl:  .  gabapentin (NEURONTIN) 300 MG capsule, Take 600 mg by mouth 3 (three) times daily. , Disp: , Rfl:  .  lisinopril (PRINIVIL,ZESTRIL) 2.5 MG tablet, Take 2.5 mg by mouth daily., Disp: , Rfl:  .  magnesium oxide (MAG-OX) 400 MG tablet, Take 400 mg by mouth daily., Disp: , Rfl:  .  metoprolol tartrate (LOPRESSOR) 25 MG tablet, Take 25 mg by mouth 2 (two) times daily., Disp: , Rfl:  .  mirtazapine (REMERON) 30 MG tablet, Take 0.5 tablets by mouth at bedtime., Disp: , Rfl:  .   montelukast (SINGULAIR) 10 MG tablet, Take 10 mg by mouth at bedtime., Disp: , Rfl:  .  Multiple Vitamins-Minerals (MULTIVITAMIN WITH MINERALS) tablet, Take 1 tablet by mouth daily., Disp: , Rfl:  .  ondansetron (ZOFRAN) 4 MG tablet, Take 4 mg by mouth every 8 (eight) hours as needed for nausea., Disp: , Rfl:  .  oxycodone (OXY-IR) 5 MG capsule, Take 5 mg by mouth every 4 (four) hours as needed., Disp: , Rfl:  .  pantoprazole (PROTONIX) 40 MG tablet, Take 40 mg by mouth daily., Disp: , Rfl:  .  pravastatin (PRAVACHOL) 40 MG tablet, Take 40 mg by mouth at bedtime., Disp: , Rfl:  .  spironolactone (ALDACTONE) 25 MG tablet, Take 25 mg by mouth daily., Disp: , Rfl:  .  torsemide (DEMADEX) 20 MG tablet, Take 2 tablets by mouth daily., Disp: , Rfl:  .  umeclidinium-vilanterol (ANORO ELLIPTA) 62.5-25 MCG/INH AEPB, Inhale 1 puff into the lungs daily., Disp: , Rfl:  .  warfarin (COUMADIN) 3 MG tablet, Take 3 mg by mouth 3 (three) times a week., Disp: , Rfl:  .  warfarin (COUMADIN) 4 MG tablet, Take 4 mg by mouth 4 (four) times a week., Disp: , Rfl:  .  warfarin (COUMADIN) 5 MG tablet, Take 5 mg by mouth as directed. Taking 5  mg M, W, F and 4 mg T, Th, Sat, Sun., Disp: , Rfl:   Past Medical History: Past Medical History:  Diagnosis Date  . Arthritis   . Chronic back pain   . Diabetes mellitus without complication ([place])   . Hypertension   . Neuropathy     Tobacco Use: Social History   Tobacco Use  Smoking Status Former Smoker  . Packs/day: 1.00  . Years: 45.00  . Pack years: 45.00  . Types: Cigarettes  . Last attempt to quit: 05/28/2005  . Years since quitting: 12.8  Smokeless Tobacco Never Used    Labs: Recent Review Flowsheet Data    Labs for ITP Cardiac and Pulmonary Rehab Latest Ref Rng & Units 08/08/2012 04/28/2013 11/05/2013 05/30/2014 06/02/2014   Cholestrol 0 - 200 mg/dL 105 - 127 64 -   LDLCALC 0 - 100 mg/dL 37 - 74 27 -   HDL 40 - 60 mg/dL 33(L) - 33(L) 15(L) -   Trlycerides 0 - 200  mg/dL 173 - 99 108 -   Hemoglobin A1c 4.2 - 6.3 % - 5.9 - - 5.3       Pulmonary Assessment Scores: Pulmonary Assessment Scores    Row Name 03/24/18 1530 03/26/18 1202       ADL UCSD   ADL Phase  Entry  Entry    SOB Score total  -  51    Rest  -  0    Walk  -  1    Stairs  -  5    Bath  -  1    Dress  -  1    Shop  -  1      CAT Score   CAT Score  -  19      mMRC Score   mMRC Score  3  3       Pulmonary Function Assessment:   Exercise Target Goals: Exercise Program Goal: Individual exercise prescription set using results from initial 6 min walk test and THRR while considering  patient's activity barriers and safety.   Exercise Prescription Goal: Initial exercise prescription builds to 30-45 minutes a day of aerobic activity, 2-3 days per week.  Home exercise guidelines will be given to patient during program as part of exercise prescription that the participant will acknowledge.  Activity Barriers & Risk Stratification: Activity Barriers & Cardiac Risk Stratification - 03/24/18 1548      Activity Barriers & Cardiac Risk Stratification   Activity Barriers  Deconditioning;Muscular Weakness;Shortness of Breath;Decreased Ventricular Function;Balance Concerns;Back Problems       6 Minute Walk: 6 Minute Walk    Row Name 03/24/18 1544         6 Minute Walk   Phase  Initial     Distance  760 feet     Walk Time  4.6 minutes     # of Rest Breaks  3 15 sec, 9 sec, 1 min     MPH  1.88     METS  2.39     RPE  13     Perceived Dyspnea   2     VO2 Peak  8.36     Symptoms  Yes (comment)     Comments  SOB, back and leg pain 8/10     Resting HR  92 bpm     Resting BP  132/64     Resting Oxygen Saturation   95 %     Exercise Oxygen Saturation  during  6 min walk  86 %     Max Ex. HR  132 bpm     Max Ex. BP  136/64     2 Minute Post BP  124/62       Interval HR   1 Minute HR  83     2 Minute HR  81     3 Minute HR  94     4 Minute HR  98     5 Minute HR  117       6 Minute HR  132     2 Minute Post HR  108     Interval Heart Rate?  Yes       Interval Oxygen   Interval Oxygen?  Yes     Baseline Oxygen Saturation %  95 %     1 Minute Oxygen Saturation %  92 %     1 Minute Liters of Oxygen  3 L     2 Minute Oxygen Saturation %  88 % rest break 2:20-2:35     2 Minute Liters of Oxygen  3 L     3 Minute Oxygen Saturation %  89 % rest break 3:14-3:23     3 Minute Liters of Oxygen  3 L     4 Minute Oxygen Saturation %  90 % rest break 4:13-5:13     4 Minute Liters of Oxygen  3 L     5 Minute Oxygen Saturation %  86 %     5 Minute Liters of Oxygen  3 L     6 Minute Oxygen Saturation %  88 %     6 Minute Liters of Oxygen  3 L     2 Minute Post Oxygen Saturation %  90 %     2 Minute Post Liters of Oxygen  3 L       Oxygen Initial Assessment: Oxygen Initial Assessment - 03/24/18 1445      Home Oxygen   Home Oxygen Device  Home Concentrator;Portable Concentrator;E-Tanks    Sleep Oxygen Prescription  Continuous   Now using Trilogy machine versus CPAP   Liters per minute  2    Home Exercise Oxygen Prescription  Continuous    Liters per minute  3    Home at Rest Exercise Oxygen Prescription  Continuous    Liters per minute  2    Compliance with Home Oxygen Use  Yes      Initial 6 min Walk   Oxygen Used  Portable Concentrator;Pulsed      Program Oxygen Prescription   Program Oxygen Prescription  Continuous;E-Tanks    Liters per minute  3      Intervention   Short Term Goals  To learn and exhibit compliance with exercise, home and travel O2 prescription;To learn and understand importance of maintaining oxygen saturations>88%;To learn and demonstrate proper use of respiratory medications;To learn and demonstrate proper pursed lip breathing techniques or other breathing techniques.;To learn and understand importance of monitoring SPO2 with pulse oximeter and demonstrate accurate use of the pulse oximeter.    Long  Term Goals  Exhibits compliance  with exercise, home and travel O2 prescription;Verbalizes importance of monitoring SPO2 with pulse oximeter and return demonstration;Maintenance of O2 saturations>88%;Exhibits proper breathing techniques, such as pursed lip breathing or other method taught during program session;Compliance with respiratory medication;Demonstrates proper use of MDI's       Oxygen Re-Evaluation: Oxygen Re-Evaluation    Row Name 03/26/18 1059  Goals/Expected Outcomes   Short Term Goals  To learn and understand importance of maintaining oxygen saturations>88%;To learn and demonstrate proper pursed lip breathing techniques or other breathing techniques.;To learn and understand importance of monitoring SPO2 with pulse oximeter and demonstrate accurate use of the pulse oximeter.;To learn and exhibit compliance with exercise, home and travel O2 prescription       Long  Term Goals  Exhibits compliance with exercise, home and travel O2 prescription;Verbalizes importance of monitoring SPO2 with pulse oximeter and return demonstration;Maintenance of O2 saturations>88%;Exhibits proper breathing techniques, such as pursed lip breathing or other method taught during program session       Comments  Reviewed PLB technique with pt.  Talked about how it work and it's important to maintaining his exercise saturations.         Goals/Expected Outcomes  Short: Become more profiecient at using PLB.   Long: Become independent at using PLB.          Oxygen Discharge (Final Oxygen Re-Evaluation): Oxygen Re-Evaluation - 03/26/18 1059      Goals/Expected Outcomes   Short Term Goals  To learn and understand importance of maintaining oxygen saturations>88%;To learn and demonstrate proper pursed lip breathing techniques or other breathing techniques.;To learn and understand importance of monitoring SPO2 with pulse oximeter and demonstrate accurate use of the pulse oximeter.;To learn and exhibit compliance with exercise, home  and travel O2 prescription    Long  Term Goals  Exhibits compliance with exercise, home and travel O2 prescription;Verbalizes importance of monitoring SPO2 with pulse oximeter and return demonstration;Maintenance of O2 saturations>88%;Exhibits proper breathing techniques, such as pursed lip breathing or other method taught during program session    Comments  Reviewed PLB technique with pt.  Talked about how it work and it's important to maintaining his exercise saturations.      Goals/Expected Outcomes  Short: Become more profiecient at using PLB.   Long: Become independent at using PLB.       Initial Exercise Prescription: Initial Exercise Prescription - 03/24/18 1500      Date of Initial Exercise RX and Referring Provider   Date  03/24/18    Referring Provider  Kelle Darting MD      Oxygen   Oxygen  Continuous    Liters  3      Treadmill   MPH  1.8    Grade  0    Minutes  15    METs  2.38      REL-XR   Level  1    Speed  50    Minutes  15    METs  2      T5 Nustep   Level  1    SPM  80    Minutes  15    METs  2      Prescription Details   Frequency (times per week)  3    Duration  Progress to 45 minutes of aerobic exercise without signs/symptoms of physical distress      Intensity   THRR 40-80% of Max Heartrate  116-141    Ratings of Perceived Exertion  11-13    Perceived Dyspnea  0-4      Progression   Progression  Continue to progress workloads to maintain intensity without signs/symptoms of physical distress.      Resistance Training   Training Prescription  Yes    Weight  3 lbs    Reps  10-15       Perform Capillary  Blood Glucose checks as needed.  Exercise Prescription Changes: Exercise Prescription Changes    Row Name 03/24/18 1500 04/02/18 1200           Response to Exercise   Blood Pressure (Admit)  132/64  110/70      Blood Pressure (Exercise)  136/64  118/70      Blood Pressure (Exit)  124/62  102/70      Heart Rate (Admit)  92 bpm  72  bpm      Heart Rate (Exercise)  132 bpm  95 bpm      Heart Rate (Exit)  108 bpm  72 bpm      Oxygen Saturation (Admit)  95 %  92 %      Oxygen Saturation (Exercise)  86 %  92 %      Oxygen Saturation (Exit)  90 %  90 %      Rating of Perceived Exertion (Exercise)  13  12      Perceived Dyspnea (Exercise)  2  3      Symptoms  back and leg pain 8/10, SOB  -      Comments  walk test results  -      Duration  -  Progress to 45 minutes of aerobic exercise without signs/symptoms of physical distress      Intensity  -  THRR unchanged        Progression   Progression  -  Continue to progress workloads to maintain intensity without signs/symptoms of physical distress.      Average METs  -  2.4        Resistance Training   Training Prescription  -  Yes      Weight  -  3 lb      Reps  -  10-15        Interval Training   Interval Training  -  No        Oxygen   Oxygen  -  Continuous      Liters  -  3        Treadmill   MPH  -  1.8      Grade  -  0      Minutes  -  15      METs  -  2.38        REL-XR   Level  -  4      Speed  -  50      Minutes  -  15      METs  -  3.1        T5 Nustep   Level  -  1      SPM  -  80      Minutes  -  15      METs  -  1.7         Exercise Comments: Exercise Comments    Row Name 03/26/18 1057           Exercise Comments  First full day of exercise!  Patient was oriented to gym and equipment including functions, settings, policies, and procedures.  Patient's individual exercise prescription and treatment plan were reviewed.  All starting workloads were established based on the results of the 6 minute walk test done at initial orientation visit.  The plan for exercise progression was also introduced and progression will be customized based on patient's performance and goals.  Exercise Goals and Review: Exercise Goals    Row Name 03/24/18 1552             Exercise Goals   Increase Physical Activity  Yes       Intervention   Provide advice, education, support and counseling about physical activity/exercise needs.;Develop an individualized exercise prescription for aerobic and resistive training based on initial evaluation findings, risk stratification, comorbidities and participant's personal goals.       Expected Outcomes  Short Term: Attend rehab on a regular basis to increase amount of physical activity.;Long Term: Add in home exercise to make exercise part of routine and to increase amount of physical activity.;Long Term: Exercising regularly at least 3-5 days a week.       Increase Strength and Stamina  Yes       Intervention  Provide advice, education, support and counseling about physical activity/exercise needs.;Develop an individualized exercise prescription for aerobic and resistive training based on initial evaluation findings, risk stratification, comorbidities and participant's personal goals.       Expected Outcomes  Short Term: Increase workloads from initial exercise prescription for resistance, speed, and METs.;Short Term: Perform resistance training exercises routinely during rehab and add in resistance training at home;Long Term: Improve cardiorespiratory fitness, muscular endurance and strength as measured by increased METs and functional capacity (6MWT)       Able to understand and use rate of perceived exertion (RPE) scale  Yes       Intervention  Provide education and explanation on how to use RPE scale       Expected Outcomes  Long Term:  Able to use RPE to guide intensity level when exercising independently;Short Term: Able to use RPE daily in rehab to express subjective intensity level       Able to understand and use Dyspnea scale  Yes       Intervention  Provide education and explanation on how to use Dyspnea scale       Expected Outcomes  Long Term: Able to use Dyspnea scale to guide intensity level when exercising independently;Short Term: Able to use Dyspnea scale daily in rehab to express  subjective sense of shortness of breath during exertion       Knowledge and understanding of Target Heart Rate Range (THRR)  Yes       Intervention  Provide education and explanation of THRR including how the numbers were predicted and where they are located for reference       Expected Outcomes  Short Term: Able to state/look up THRR;Short Term: Able to use daily as guideline for intensity in rehab;Long Term: Able to use THRR to govern intensity when exercising independently       Able to check pulse independently  Yes       Intervention  Provide education and demonstration on how to check pulse in carotid and radial arteries.;Review the importance of being able to check your own pulse for safety during independent exercise       Expected Outcomes  Short Term: Able to explain why pulse checking is important during independent exercise;Long Term: Able to check pulse independently and accurately       Understanding of Exercise Prescription  Yes       Intervention  Provide education, explanation, and written materials on patient's individual exercise prescription       Expected Outcomes  Short Term: Able to explain program exercise prescription;Long Term: Able to explain home exercise prescription to exercise independently  Exercise Goals Re-Evaluation : Exercise Goals Re-Evaluation    Row Name 03/26/18 1058             Exercise Goal Re-Evaluation   Exercise Goals Review  Increase Physical Activity;Able to understand and use rate of perceived exertion (RPE) scale;Knowledge and understanding of Target Heart Rate Range (THRR);Understanding of Exercise Prescription;Increase Strength and Stamina;Able to understand and use Dyspnea scale       Comments  Reviewed RPE scale, THR and program prescription with pt today.  Pt voiced understanding and was given a copy of goals to take home.        Expected Outcomes  Short: Use RPE daily to regulate intensity. Long: Follow program prescription in  THR.          Discharge Exercise Prescription (Final Exercise Prescription Changes): Exercise Prescription Changes - 04/02/18 1200      Response to Exercise   Blood Pressure (Admit)  110/70    Blood Pressure (Exercise)  118/70    Blood Pressure (Exit)  102/70    Heart Rate (Admit)  72 bpm    Heart Rate (Exercise)  95 bpm    Heart Rate (Exit)  72 bpm    Oxygen Saturation (Admit)  92 %    Oxygen Saturation (Exercise)  92 %    Oxygen Saturation (Exit)  90 %    Rating of Perceived Exertion (Exercise)  12    Perceived Dyspnea (Exercise)  3    Duration  Progress to 45 minutes of aerobic exercise without signs/symptoms of physical distress    Intensity  THRR unchanged      Progression   Progression  Continue to progress workloads to maintain intensity without signs/symptoms of physical distress.    Average METs  2.4      Resistance Training   Training Prescription  Yes    Weight  3 lb    Reps  10-15      Interval Training   Interval Training  No      Oxygen   Oxygen  Continuous    Liters  3      Treadmill   MPH  1.8    Grade  0    Minutes  15    METs  2.38      REL-XR   Level  4    Speed  50    Minutes  15    METs  3.1      T5 Nustep   Level  1    SPM  80    Minutes  15    METs  1.7       Nutrition:  Target Goals: Understanding of nutrition guidelines, daily intake of sodium <1559m, cholesterol <2069m calories 30% from fat and 7% or less from saturated fats, daily to have 5 or more servings of fruits and vegetables.  Biometrics: Pre Biometrics - 03/24/18 1553      Pre Biometrics   Height  _0  (1.753 m)    Weight  200 lb 3.2 oz (90.8 kg)    Waist Circumference  42.5 inches    Hip Circumference  42 inches    Waist to Hip Ratio  1.01 %    BMI (Calculated)  29.55    Single Leg Stand  0.73 seconds        Nutrition Therapy Plan and Nutrition Goals: Nutrition Therapy & Goals - 03/24/18 1448      Intervention Plan   Intervention  Prescribe, educate  and counsel  regarding individualized specific dietary modifications aiming towards targeted core components such as weight, hypertension, lipid management, diabetes, heart failure and other comorbidities.    Expected Outcomes  Short Term Goal: Understand basic principles of dietary content, such as calories, fat, sodium, cholesterol and nutrients.;Short Term Goal: A plan has been developed with personal nutrition goals set during dietitian appointment.;Long Term Goal: Adherence to prescribed nutrition plan.       Nutrition Assessments: Nutrition Assessments - 03/26/18 1205      MEDFICTS Scores   Pre Score  60       Nutrition Goals Re-Evaluation:   Nutrition Goals Discharge (Final Nutrition Goals Re-Evaluation):   Psychosocial: Target Goals: Acknowledge presence or absence of significant depression and/or stress, maximize coping skills, provide positive support system. Participant is able to verbalize types and ability to use techniques and skills needed for reducing stress and depression.   Initial Review & Psychosocial Screening: Initial Psych Review & Screening - 03/24/18 1446      Initial Review   Current issues with  History of Depression;Current Psychotropic Meds;Current Stress Concerns;Current Sleep Concerns    Source of Stress Concerns  Chronic Illness;Unable to perform yard/household activities    Comments  Changed pysch meds since last seen, waking up frequently at night, newly diagnosis lung cancer, breathing continues to be a struggle      [place]?  Yes    Comments  Mr Lowrey has great support from his wife, daughter, and son.       Barriers   Psychosocial barriers to participate in program  Psychosocial barriers identified (see note);The patient should benefit from training in stress management and relaxation.      Screening Interventions   Interventions  Encouraged to exercise;Provide feedback about the scores to participant;Program  counselor consult;To provide support and resources with identified psychosocial needs    Expected Outcomes  Short Term goal: Utilizing psychosocial counselor, staff and physician to assist with identification of specific Stressors or current issues interfering with healing process. Setting desired goal for each stressor or current issue identified.;Long Term Goal: Stressors or current issues are controlled or eliminated.;Short Term goal: Identification and review with participant of any Quality of Life or Depression concerns found by scoring the questionnaire.;Long Term goal: The participant improves quality of Life and PHQ9 Scores as seen by post scores and/or verbalization of changes       Quality of Life Scores:  Scores of 19 and below usually indicate a poorer quality of life in these areas.  A difference of  2-3 points is a clinically meaningful difference.  A difference of 2-3 points in the total score of the Quality of Life Index has been associated with significant improvement in overall quality of life, self-image, physical symptoms, and general health in studies assessing change in quality of life.  PHQ-9: Recent Review Flowsheet Data    Depression screen [facility] 2/9 03/26/2018 03/24/2018 12/11/2017 11/20/2017 10/22/2017   Decreased Interest 2 1 0 0 2   Down, Depressed, Hopeless _0 0 2   PHQ - 2 Score _1 0 4   Altered sleeping 1 1 0 - 2   Tired, decreased energy _2 - 2   Change in appetite _3 - 2   Feeling bad or failure about yourself  0 0 0 - 2   Trouble concentrating 0 0 0 - 2   Moving slowly or fidgety/restless 1 0 0 - 2  Suicidal thoughts 0 0 0 - 0   PHQ-9 Score _0 - 16   Difficult doing work/chores Somewhat difficult Very difficult Not difficult at all - Somewhat difficult     Interpretation of Total Score  Total Score Depression Severity:  1-4 = Minimal depression, 5-9 = Mild depression, 10-14 = Moderate depression, 15-19 = Moderately severe depression, 20-27 =  Severe depression   Psychosocial Evaluation and Intervention:   Psychosocial Re-Evaluation:   Psychosocial Discharge (Final Psychosocial Re-Evaluation):   Education: Education Goals: Education classes will be provided on a weekly basis, covering required topics. Participant will state understanding/return demonstration of topics presented.  Learning Barriers/Preferences: Learning Barriers/Preferences - 03/24/18 1448      Learning Barriers/Preferences   Learning Barriers  Sight;Hearing   glasses and hearing aids   Learning Preferences  None       Education Topics:  Initial Evaluation Education: - Verbal, written and demonstration of respiratory meds, oximetry and breathing techniques. Instruction on use of nebulizers and MDIs and importance of monitoring MDI activations.   Pulmonary Rehab from 03/24/2018 in [facility] Cardiac and Pulmonary Rehab  Date  03/24/18  Educator  [facility]  Instruction Review Code  1- Verbalizes Understanding      General Nutrition Guidelines/Fats and Fiber: -Group instruction provided by verbal, written material, models and posters to present the general guidelines for heart healthy nutrition. Gives an explanation and review of dietary fats and fiber.   Pulmonary Rehab from 10/23/2017 in [facility] Cardiac and Pulmonary Rehab  Date  10/14/17  Educator  CR  Instruction Review Code  1- Verbalizes Understanding      Controlling Sodium/Reading Food Labels: -Group verbal and written material supporting the discussion of sodium use in heart healthy nutrition. Review and explanation with models, verbal and written materials for utilization of the food label.   Exercise Physiology & General Exercise Guidelines: - Group verbal and written instruction with models to review the exercise physiology of the cardiovascular system and associated critical values. Provides general exercise guidelines with specific guidelines to those with heart or lung disease.    Aerobic  Exercise & Resistance Training: - Gives group verbal and written instruction on the various components of exercise. Focuses on aerobic and resistive training programs and the benefits of this training and how to safely progress through these programs.   Cardiac Rehab from 03/21/2016 in [facility] Cardiac and Pulmonary Rehab  Date  02/29/16  Educator  [facility]  Instruction Review Code (retired)  2- Statistician, Balance, Mind/Body Relaxation: Provides group verbal/written instruction on the benefits of flexibility and balance training, including mind/body exercise modes such as yoga, pilates and tai chi.  Demonstration and skill practice provided.   Cardiac Rehab from 03/21/2016 in [facility] Cardiac and Pulmonary Rehab  Date  01/09/16  Educator  [facility]  Instruction Review Code (retired)  2- meets goals/outcomes      Stress and Anxiety: - Provides group verbal and written instruction about the health risks of elevated stress and causes of high stress.  Discuss the correlation between heart/lung disease and anxiety and treatment options. Review healthy ways to manage with stress and anxiety.   Pulmonary Rehab from 10/23/2017 in [facility] Cardiac and Pulmonary Rehab  Date  10/23/17  Educator  [facility]  Instruction Review Code  1- Verbalizes Understanding      Depression: - Provides group verbal and written instruction on the correlation between heart/lung disease and depressed mood, treatment options, and the stigmas associated  with seeking treatment.   Exercise & Equipment Safety: - Individual verbal instruction and demonstration of equipment use and safety with use of the equipment.   Pulmonary Rehab from 03/24/2018 in [facility] Cardiac and Pulmonary Rehab  Date  03/24/18  Educator  [facility]  Instruction Review Code  1- Verbalizes Understanding      Infection Prevention: - Provides verbal and written material to individual with discussion of infection control including proper hand washing and  proper equipment cleaning during exercise session.   Pulmonary Rehab from 03/24/2018 in [facility] Cardiac and Pulmonary Rehab  Date  03/24/18  Educator  [facility]  Instruction Review Code  1- Verbalizes Understanding      Falls Prevention: - Provides verbal and written material to individual with discussion of falls prevention and safety.   Pulmonary Rehab from 03/24/2018 in [facility] Cardiac and Pulmonary Rehab  Date  03/24/18  Educator  [facility]  Instruction Review Code  1- Verbalizes Understanding      Diabetes: - Individual verbal and written instruction to review signs/symptoms of diabetes, desired ranges of glucose level fasting, after meals and with exercise. Advice that pre and post exercise glucose checks will be done for 3 sessions at entry of program.   Cardiac Rehab from 03/21/2016 in [facility] Cardiac and Pulmonary Rehab  Date  01/02/16  Educator  D. Joya Gaskins, RN  Instruction Review Code (retired)  2- meets goals/outcomes      Chronic Lung Diseases: - Group verbal and written instruction to review updates, respiratory medications, advancements in procedures and treatments. Discuss use of supplemental oxygen including available portable oxygen systems, continuous and intermittent flow rates, concentrators, personal use and safety guidelines. Review proper use of inhaler and spacers. Provide informative websites for self-education.    Energy Conservation: - Provide group verbal and written instruction for methods to conserve energy, plan and organize activities. Instruct on pacing techniques, use of adaptive equipment and posture/positioning to relieve shortness of breath.   Pulmonary Rehab from 10/23/2017 in [facility] Cardiac and Pulmonary Rehab  Date  10/16/17  Educator  [facility]  Instruction Review Code  1- Verbalizes Understanding      Triggers and Exacerbations: - Group verbal and written instruction to review types of environmental triggers and ways to prevent exacerbations. Discuss weather changes,  air quality and the benefits of nasal washing. Review warning signs and symptoms to help prevent infections. Discuss techniques for effective airway clearance, coughing, and vibrations.   AED/CPR: - Group verbal and written instruction with the use of models to demonstrate the basic use of the AED with the basic ABC's of resuscitation.   Pulmonary Rehab from 10/23/2017 in [facility] Cardiac and Pulmonary Rehab  Date  10/18/17  Educator  [place]  Instruction Review Code  1- Verbalizes Understanding      Anatomy and Physiology of the Lungs: - Group verbal and written instruction with the use of models to provide basic lung anatomy and physiology related to function, structure and complications of lung disease.   Anatomy & Physiology of the Heart: - Group verbal and written instruction and models provide basic cardiac anatomy and physiology, with the coronary electrical and arterial systems. Review of Valvular disease and Heart Failure   Cardiac Rehab from 03/21/2016 in [facility] Cardiac and Pulmonary Rehab  Date  01/16/16  Educator  SB  Instruction Review Code (retired)  2- meets goals/outcomes      Cardiac Medications: - Group verbal and written instruction to review commonly prescribed medications for heart disease. Reviews the medication, class  of the drug, and side effects.   Pulmonary Rehab from 10/23/2017 in [facility] Cardiac and Pulmonary Rehab  Date  10/04/17  Educator  [facility]  Instruction Review Code  1- Verbalizes Understanding      Know Your Numbers and Risk Factors: -Group verbal and written instruction about important numbers in your health.  Discussion of what are risk factors and how they play a role in the disease process.  Review of Cholesterol, Blood Pressure, Diabetes, and BMI and the role they play in your overall health.   Sleep Hygiene: -Provides group verbal and written instruction about how sleep can affect your health.  Define sleep hygiene, discuss sleep cycles and impact of sleep  habits. Review good sleep hygiene tips.    Other: -Provides group and verbal instruction on various topics (see comments)    Knowledge Questionnaire Score: Knowledge Questionnaire Score - 03/26/18 1205      Knowledge Questionnaire Score   Pre Score  15/18   reviewed with patient       Core Components/Risk Factors/Patient Goals at Admission: Personal Goals and Risk Factors at Admission - 03/24/18 1448      Core Components/Risk Factors/Patient Goals on Admission    Weight Management  Yes;Weight Maintenance;Weight Loss    Intervention  Weight Management: Develop a combined nutrition and exercise program designed to reach desired caloric intake, while maintaining appropriate intake of nutrient and fiber, sodium and fats, and appropriate energy expenditure required for the weight goal.;Weight Management: Provide education and appropriate resources to help participant work on and attain dietary goals.;Weight Management/Obesity: Establish reasonable short term and long term weight goals.;Obesity: Provide education and appropriate resources to help participant work on and attain dietary goals.    Admit Weight  200 lb 3.2 oz (90.8 kg)    Goal Weight: Short Term  195 lb (88.5 kg)    Goal Weight: Long Term  185 lb (83.9 kg)    Expected Outcomes  Short Term: Continue to assess and modify interventions until short term weight is achieved;Long Term: Adherence to nutrition and physical activity/exercise program aimed toward attainment of established weight goal;Weight Loss: Understanding of general recommendations for a balanced deficit meal plan, which promotes 1-2 lb weight loss per week and includes a negative energy balance of [phone] kcal/d;Understanding recommendations for meals to include 15-35% energy as protein, 25-35% energy from fat, 35-60% energy from carbohydrates, less than 235m of dietary cholesterol, 20-35 gm of total fiber daily;Understanding of distribution of calorie intake throughout  the day with the consumption of 4-5 meals/snacks    Improve shortness of breath with ADL's  Yes    Intervention  Provide education, individualized exercise plan and daily activity instruction to help decrease symptoms of SOB with activities of daily living.    Expected Outcomes  Short Term: Improve cardiorespiratory fitness to achieve a reduction of symptoms when performing ADLs;Long Term: Be able to perform more ADLs without symptoms or delay the onset of symptoms    Heart Failure  Yes    Intervention  Provide a combined exercise and nutrition program that is supplemented with education, support and counseling about heart failure. Directed toward relieving symptoms such as shortness of breath, decreased exercise tolerance, and extremity edema.    Expected Outcomes  Improve functional capacity of life;Short term: Attendance in program 2-3 days a week with increased exercise capacity. Reported lower sodium intake. Reported increased fruit and vegetable intake. Reports medication compliance.;Short term: Daily weights obtained and reported for increase. Utilizing diuretic protocols set by  physician.;Long term: Adoption of self-care skills and reduction of barriers for early signs and symptoms recognition and intervention leading to self-care maintenance.    Hypertension  Yes    Intervention  Provide education on lifestyle modifcations including regular physical activity/exercise, weight management, moderate sodium restriction and increased consumption of fresh fruit, vegetables, and low fat dairy, alcohol moderation, and smoking cessation.;Monitor prescription use compliance.    Expected Outcomes  Short Term: Continued assessment and intervention until BP is < 140/69m HG in hypertensive participants. < 130/824mHG in hypertensive participants with diabetes, heart failure or chronic kidney disease.;Long Term: Maintenance of blood pressure at goal levels.    Lipids  Yes    Intervention  Provide education and  support for participant on nutrition & aerobic/resistive exercise along with prescribed medications to achieve LDL <7088mHDL >37m28m  Expected Outcomes  Short Term: Participant states understanding of desired cholesterol values and is compliant with medications prescribed. Participant is following exercise prescription and nutrition guidelines.;Long Term: Cholesterol controlled with medications as prescribed, with individualized exercise RX and with personalized nutrition plan. Value goals: LDL < 70mg67mL > 40 mg.       Core Components/Risk Factors/Patient Goals Review:    Core Components/Risk Factors/Patient Goals at Discharge (Final Review):    ITP Comments: ITP Comments    Row Name 03/24/18 1534 04/14/18 0826         ITP Comments  Medical evaluation completed today.  Pt started on visit 15 today.  New ITP created and sent for review to Dr. Mark Emily Filbertical Director.  Documentation for diagnosis from [facility] encounter 03/18/18 and 09/16/17.  30 day review completed. ITP sent to Dr. Mark Emily Filbertctor of [facility] with ITP unless changes are made by physician.         Comments: 30 day review

## 2018-04-16 ENCOUNTER — Encounter: Payer: Medicare Other | Admitting: *Deleted

## 2018-04-16 DIAGNOSIS — J449 Chronic obstructive pulmonary disease, unspecified: Secondary | ICD-10-CM | POA: Diagnosis not present

## 2018-04-16 DIAGNOSIS — Z95811 Presence of heart assist device: Secondary | ICD-10-CM

## 2018-04-16 NOTE — Progress Notes (Signed)
Daily Session Note  Patient Details  Name: William Bray MRN: [id number] Date of Birth: 03-01-1951 Referring Provider:     Pulmonary Rehab from 03/24/2018 in [facility] Cardiac and Pulmonary Rehab  Referring Provider  Kelle Darting MD      Encounter Date: 04/16/2018  Check In: Session Check In - 04/16/18 1013      Check-In   Supervising physician immediately available to respond to emergencies  [name] immediately available ER MD    Physician(s)  Drs. Malinda and [facility]    Location  [facility]-Cardiac & Pulmonary Rehab    Staff Present  [name], MA, RCEP, CCRP, Exercise Physiologist;Joseph [employer], [place], ACSM CEP, Exercise Physiologist    Medication changes reported      No    Fall or balance concerns reported     No    Warm-up and Cool-down  Performed as group-led instruction    Resistance Training Performed  Yes    VAD Patient?  Yes    PAD/SET Patient?  No      VAD patient   Has back up controller?  Yes    Has spare charged batteries?  Yes    Has battery cables?  Yes    Has compatible battery clips?  Yes      Pain Assessment   Currently in Pain?  No/denies          Social History   Tobacco Use  Smoking Status Former Smoker  . Packs/day: 1.00  . Years: 45.00  . Pack years: 45.00  . Types: Cigarettes  . Last attempt to quit: 05/28/2005  . Years since quitting: 12.8  Smokeless Tobacco Never Used    Goals Met:  Proper associated with RPD/PD & O2 Sat Independence with exercise equipment Using PLB without cueing & demonstrates good technique Exercise tolerated well No report of cardiac concerns or symptoms Strength training completed today  Goals Unmet:  Not Applicable  Comments: Pt able to follow exercise prescription today without complaint.  Will continue to monitor for progression.    Dr. Emily Filbert is Medical Director for [place] and [name].

## 2018-04-21 DIAGNOSIS — J449 Chronic obstructive pulmonary disease, unspecified: Secondary | ICD-10-CM | POA: Diagnosis not present

## 2018-04-21 NOTE — Progress Notes (Signed)
Daily Session Note  Patient Details  Name: William Bray MRN: [id number] Date of Birth: September 22, 1950 Referring Provider:     Pulmonary Rehab from 03/24/2018 in [facility] Cardiac and Pulmonary Rehab  Referring Provider  Kelle Darting MD      Encounter Date: 04/21/2018  Check In: Session Check In - 04/21/18 0950      Check-In   Supervising physician immediately available to respond to emergencies  [name] immediately available ER MD    Physician(s)  Dr. Joni Fears and Jimmye Norman    Location  [facility]-Cardiac & Pulmonary Rehab    Staff Present  Justin Mend RCP,RRT,BSRT;Amanda Oletta Darter, [place], ACSM CEP, Exercise Physiologist;Kelly Amedeo Plenty, BS, ACSM CEP, Exercise Physiologist    Medication changes reported      No    Fall or balance concerns reported     No    Warm-up and Cool-down  Performed as group-led instruction    Resistance Training Performed  Yes    VAD Patient?  Yes    PAD/SET Patient?  No      VAD patient   Has back up controller?  Yes    Has spare charged batteries?  Yes    Has battery cables?  Yes    Has compatible battery clips?  Yes      Pain Assessment   Currently in Pain?  No/denies          Social History   Tobacco Use  Smoking Status Former Smoker  . Packs/day: 1.00  . Years: 45.00  . Pack years: 45.00  . Types: Cigarettes  . Last attempt to quit: 05/28/2005  . Years since quitting: 12.9  Smokeless Tobacco Never Used    Goals Met:  Independence with exercise equipment Exercise tolerated well No report of cardiac concerns or symptoms Strength training completed today  Goals Unmet:  Not Applicable  Comments: Pt able to follow exercise prescription today without complaint.  Will continue to monitor for progression.    Dr. Emily Filbert is Medical Director for [place] and [name].

## 2018-04-28 ENCOUNTER — Encounter: Payer: Medicare Other | Attending: Pulmonary Disease | Admitting: *Deleted

## 2018-04-28 DIAGNOSIS — J449 Chronic obstructive pulmonary disease, unspecified: Secondary | ICD-10-CM | POA: Diagnosis not present

## 2018-04-28 DIAGNOSIS — I10 Essential (primary) hypertension: Secondary | ICD-10-CM | POA: Insufficient documentation

## 2018-04-28 DIAGNOSIS — Z7982 Long term (current) use of aspirin: Secondary | ICD-10-CM | POA: Diagnosis not present

## 2018-04-28 DIAGNOSIS — E119 Type 2 diabetes mellitus without complications: Secondary | ICD-10-CM | POA: Diagnosis not present

## 2018-04-28 DIAGNOSIS — Z79899 Other long term (current) drug therapy: Secondary | ICD-10-CM | POA: Insufficient documentation

## 2018-04-28 DIAGNOSIS — Z79891 Long term (current) use of opiate analgesic: Secondary | ICD-10-CM | POA: Insufficient documentation

## 2018-04-28 DIAGNOSIS — Z7901 Long term (current) use of anticoagulants: Secondary | ICD-10-CM | POA: Diagnosis not present

## 2018-04-28 DIAGNOSIS — I5022 Chronic systolic (congestive) heart failure: Secondary | ICD-10-CM

## 2018-04-28 DIAGNOSIS — Z87891 Personal history of nicotine dependence: Secondary | ICD-10-CM | POA: Diagnosis not present

## 2018-04-28 DIAGNOSIS — Z95811 Presence of heart assist device: Secondary | ICD-10-CM

## 2018-04-28 NOTE — Progress Notes (Signed)
Daily Session Note  Patient Details  Name: William Bray MRN: [id number] Date of Birth: 05/01/1951 Referring Provider:     Pulmonary Rehab from 03/24/2018 in [facility] Cardiac and Pulmonary Rehab  Referring Provider  Kelle Darting MD      Encounter Date: 04/28/2018  Check In: Session Check In - 04/28/18 1012      Check-In   Supervising physician immediately available to respond to emergencies  [name] immediately available ER MD    Location  [facility]-Cardiac & Pulmonary Rehab    Staff Present  Earlean Shawl, BS, ACSM CEP, Exercise Physiologist;Joseph [employer], BA, ACSM CEP, Exercise Physiologist    Medication changes reported      No    Fall or balance concerns reported     No    Tobacco Cessation  No Change    Warm-up and Cool-down  Performed as group-led instruction    Resistance Training Performed  Yes    VAD Patient?  Yes    PAD/SET Patient?  No      VAD patient   Has back up controller?  Yes    Has spare charged batteries?  Yes    Has battery cables?  Yes    Has compatible battery clips?  Yes      Pain Assessment   Currently in Pain?  No/denies    Multiple Pain Sites  No          Social History   Tobacco Use  Smoking Status Former Smoker  . Packs/day: 1.00  . Years: 45.00  . Pack years: 45.00  . Types: Cigarettes  . Last attempt to quit: 05/28/2005  . Years since quitting: 12.9  Smokeless Tobacco Never Used    Goals Met:  Proper associated with RPD/PD & O2 Sat Independence with exercise equipment Exercise tolerated well No report of cardiac concerns or symptoms Strength training completed today  Goals Unmet:  Not Applicable  Comments: Pt able to follow exercise prescription today without complaint.  Will continue to monitor for progression.    Dr. Emily Filbert is Medical Director for [place] and [name].

## 2018-04-30 DIAGNOSIS — J449 Chronic obstructive pulmonary disease, unspecified: Secondary | ICD-10-CM | POA: Diagnosis not present

## 2018-04-30 NOTE — Progress Notes (Signed)
Daily Session Note  Patient Details  Name: QUENTEN NAWAZ MRN: [id number] Date of Birth: 08/05/1950 Referring Provider:     Pulmonary Rehab from 03/24/2018 in [facility] Cardiac and Pulmonary Rehab  Referring Provider  Kelle Darting MD      Encounter Date: 04/30/2018  Check In: Session Check In - 04/30/18 1019      Check-In   Supervising physician immediately available to respond to emergencies  [name] immediately available ER MD    Physician(s)  Dr. Jimmye Norman and Quentin Cornwall    Location  [facility]-Cardiac & Pulmonary Rehab    Staff Present  [name], MA, RCEP, CCRP, Exercise Physiologist;Celes Dedic [facility], [place], ACSM CEP, Exercise Physiologist    Medication changes reported      No    Fall or balance concerns reported     No    Warm-up and Cool-down  Performed as group-led instruction    Resistance Training Performed  Yes    VAD Patient?  Yes    PAD/SET Patient?  No      VAD patient   Has back up controller?  Yes    Has spare charged batteries?  Yes    Has battery cables?  Yes    Has compatible battery clips?  Yes      Pain Assessment   Currently in Pain?  No/denies          Social History   Tobacco Use  Smoking Status Former Smoker  . Packs/day: 1.00  . Years: 45.00  . Pack years: 45.00  . Types: Cigarettes  . Last attempt to quit: 05/28/2005  . Years since quitting: 12.9  Smokeless Tobacco Never Used    Goals Met:  Independence with exercise equipment Exercise tolerated well No report of cardiac concerns or symptoms Strength training completed today  Goals Unmet:  Not Applicable  Comments: Pt able to follow exercise prescription today without complaint.  Will continue to monitor for progression.    Dr. Emily Filbert is Medical Director for [place] and [name].

## 2018-05-02 DIAGNOSIS — J449 Chronic obstructive pulmonary disease, unspecified: Secondary | ICD-10-CM

## 2018-05-02 NOTE — Progress Notes (Signed)
Daily Session Note  Patient Details  Name: William Bray MRN: [id number] Date of Birth: 05-26-1951 Referring Provider:     Pulmonary Rehab from 03/24/2018 in [facility] Cardiac and Pulmonary Rehab  Referring Provider  Kelle Darting MD      Encounter Date: 05/02/2018  Check In: Session Check In - 05/02/18 0954      Check-In   Supervising physician immediately available to respond to emergencies  [name] immediately available ER MD    Physician(s)  Dr. Cinda Quest and Joni Fears    Location  [facility]-Cardiac & Pulmonary Rehab    Staff Present  Renita Papa, RN BSN;Kasey Hansell RCP,RRT,BSRT;Amanda Oletta Darter, [place], ACSM CEP, Exercise Physiologist    Medication changes reported      No    Fall or balance concerns reported     No    Warm-up and Cool-down  Performed as group-led instruction    Resistance Training Performed  Yes    VAD Patient?  Yes      VAD patient   Has back up controller?  Yes    Has spare charged batteries?  Yes    Has battery cables?  Yes    Has compatible battery clips?  Yes      Pain Assessment   Currently in Pain?  No/denies          Social History   Tobacco Use  Smoking Status Former Smoker  . Packs/day: 1.00  . Years: 45.00  . Pack years: 45.00  . Types: Cigarettes  . Last attempt to quit: 05/28/2005  . Years since quitting: 12.9  Smokeless Tobacco Never Used    Goals Met:  Independence with exercise equipment Exercise tolerated well No report of cardiac concerns or symptoms Strength training completed today  Goals Unmet:  Not Applicable  Comments: Pt able to follow exercise prescription today without complaint.  Will continue to monitor for progression.    Dr. Emily Filbert is Medical Director for [place] and [name].

## 2018-05-05 DIAGNOSIS — J449 Chronic obstructive pulmonary disease, unspecified: Secondary | ICD-10-CM | POA: Diagnosis not present

## 2018-05-05 NOTE — Progress Notes (Signed)
Daily Session Note  Patient Details  Name: William Bray MRN: [id number] Date of Birth: 1950/09/29 Referring Provider:     Pulmonary Rehab from 03/24/2018 in [facility] Cardiac and Pulmonary Rehab  Referring Provider  Kelle Darting MD      Encounter Date: 05/05/2018  Check In: Session Check In - 05/05/18 1011      Check-In   Supervising physician immediately available to respond to emergencies  [name] immediately available ER MD    Physician(s)  Dr. Kerman Passey and Quentin Cornwall    Location  [facility]-Cardiac & Pulmonary Rehab    Staff Present  Justin Mend Jaci Carrel, BS, ACSM CEP, Exercise Physiologist;Laureen Owens Shark, BS, RRT, Respiratory Therapist    Medication changes reported      No    Fall or balance concerns reported     No    Warm-up and Cool-down  Performed as group-led instruction    Resistance Training Performed  Yes    VAD Patient?  Yes    PAD/SET Patient?  No      VAD patient   Has back up controller?  Yes    Has spare charged batteries?  Yes    Has battery cables?  Yes    Has compatible battery clips?  Yes      Pain Assessment   Currently in Pain?  No/denies          Social History   Tobacco Use  Smoking Status Former Smoker  . Packs/day: 1.00  . Years: 45.00  . Pack years: 45.00  . Types: Cigarettes  . Last attempt to quit: 05/28/2005  . Years since quitting: 12.9  Smokeless Tobacco Never Used    Goals Met:  Independence with exercise equipment Exercise tolerated well No report of cardiac concerns or symptoms Strength training completed today  Goals Unmet:  Not Applicable  Comments: Pt able to follow exercise prescription today without complaint.  Will continue to monitor for progression.    Dr. Emily Filbert is Medical Director for [place] and [name].

## 2018-05-07 DIAGNOSIS — J449 Chronic obstructive pulmonary disease, unspecified: Secondary | ICD-10-CM | POA: Diagnosis not present

## 2018-05-07 DIAGNOSIS — Z95811 Presence of heart assist device: Secondary | ICD-10-CM

## 2018-05-07 DIAGNOSIS — I5022 Chronic systolic (congestive) heart failure: Secondary | ICD-10-CM

## 2018-05-07 NOTE — Progress Notes (Signed)
Daily Session Note  Patient Details  Name: William Bray MRN: [id number] Date of Birth: 1950-06-06 Referring Provider:     Pulmonary Rehab from 03/24/2018 in [facility] Card[facility] Pul[facility] Rehab  Referring Provider  Kelle Darting MD      Encounter Date: 05/07/2018  Check In: Session Check In - 05/07/18 1043      Check-In   Supervising physician immediately available to respond to emergencies  [name] immediately available ER MD    Physician(s)  Darl Householder and Jimmye Norman    Location  [facility]-Cardiac & Pulmonary Rehab    Staff Present  [name], MA, RCEP, CCRP, Exercise Physiologist;Joseph Foy [place], [place], ACSM CEP, Exercise Physiologist    Medication changes reported      No    Fall or balance concerns reported     No    Warm-up and Cool-down  Performed as group-led instruction    Resistance Training Performed  Yes    VAD Patient?  Yes    PAD/SET Patient?  No      VAD patient   Has back up controller?  Yes    Has spare charged batteries?  Yes    Has battery cables?  Yes    Has compatible battery clips?  Yes      Pain Assessment   Currently in Pain?  No/denies    Multiple Pain Sites  No          Social History   Tobacco Use  Smoking Status Former Smoker  . Packs/day: 1.00  . Years: 45.00  . Pack years: 45.00  . Types: Cigarettes  . Last attempt to quit: 05/28/2005  . Years since quitting: 12.9  Smokeless Tobacco Never Used    Goals Met:  Proper associated with RPD/PD & O2 Sat Independence with exercise equipment Exercise tolerated well Strength training completed today  Goals Unmet:  Not Applicable  Comments: Pt able to follow exercise prescription today without complaint.  Will continue to monitor for progression.    Dr. Emily Filbert is Medical Director for [place] and [name].

## 2018-05-09 ENCOUNTER — Encounter: Payer: Medicare Other | Admitting: *Deleted

## 2018-05-09 DIAGNOSIS — J449 Chronic obstructive pulmonary disease, unspecified: Secondary | ICD-10-CM

## 2018-05-09 NOTE — Progress Notes (Signed)
Daily Session Note  Patient Details  Name: William Bray MRN: [id number] Date of Birth: 12-May-1951 Referring Provider:     Pulmonary Rehab from 03/24/2018 in [facility] Cardiac and Pulmonary Rehab  Referring Provider  Kelle Darting MD      Encounter Date: 05/09/2018  Check In: Session Check In - 05/09/18 1017      Check-In   Supervising physician immediately available to respond to emergencies  [name] immediately available ER MD    Physician(s)  Dr. Cherylann Banas and Burlene Arnt    Location  [facility]-Cardiac & Pulmonary Rehab    Staff Present  Nada Maclachlan, BA, ACSM CEP, Exercise Physiologist;Joseph Alcus Dad, RN BSN    Medication changes reported      No    Fall or balance concerns reported     No    Warm-up and Cool-down  Performed as group-led Higher education careers adviser Performed  Yes    VAD Patient?  No    PAD/SET Patient?  No      Pain Assessment   Currently in Pain?  No/denies          Social History   Tobacco Use  Smoking Status Former Smoker  . Packs/day: 1.00  . Years: 45.00  . Pack years: 45.00  . Types: Cigarettes  . Last attempt to quit: 05/28/2005  . Years since quitting: 12.9  Smokeless Tobacco Never Used    Goals Met:  Proper associated with RPD/PD & O2 Sat Independence with exercise equipment Using PLB without cueing & demonstrates good technique Exercise tolerated well No report of cardiac concerns or symptoms Strength training completed today  Goals Unmet:  Not Applicable  Comments: Pt able to follow exercise prescription today without complaint.  Will continue to monitor for progression.    Dr. Emily Filbert is Medical Director for [place] and [name].

## 2018-05-12 DIAGNOSIS — J449 Chronic obstructive pulmonary disease, unspecified: Secondary | ICD-10-CM

## 2018-05-12 NOTE — Progress Notes (Signed)
Pulmonary Individual Treatment Plan  Patient Details  Name: William Bray MRN: [id number] Date of Birth: 1950-12-02 Referring Provider:     Pulmonary Rehab from 03/24/2018 in [facility] Cardiac and Pulmonary Rehab  Referring Provider  Kelle Darting MD      Initial Encounter Date:    Pulmonary Rehab from 03/24/2018 in [facility] Cardiac and Pulmonary Rehab  Date  03/24/18      Visit Diagnosis: Chronic obstructive pulmonary disease, unspecified COPD type ([place])  Patient's Home Medications on Admission:  Current Outpatient Medications:  .  albuterol (PROVENTIL) (2.5 MG/3ML) 0.083% nebulizer solution, Inhale 3 mLs into the lungs every 6 (six) hours as needed., Disp: , Rfl:  .  allopurinol (ZYLOPRIM) 100 MG tablet, Take 100 mg by mouth daily., Disp: , Rfl:  .  ALPRAZolam (XANAX) 0.5 MG tablet, Take 0.5 mg by mouth 2 (two) times daily as needed. ONE IN THE MORNING AND ONE AT BEDTIME PRN, Disp: , Rfl:  .  aspirin EC 81 MG tablet, Take 81 mg by mouth daily., Disp: , Rfl:  .  budesonide (PULMICORT) 0.5 MG/2ML nebulizer solution, Inhale 2 mLs into the lungs 2 (two) times daily., Disp: , Rfl:  .  cetirizine (ZYRTEC) 10 MG tablet, Take 10 mg by mouth daily., Disp: , Rfl:  .  cyanocobalamin 100 MCG tablet, Take 100 mcg by mouth daily., Disp: , Rfl:  .  ferrous sulfate 324 (65 Fe) MG TBEC, Take 324 mg by mouth every morning. WITH BREAKFAST, Disp: , Rfl:  .  FLUoxetine (PROZAC) 20 MG capsule, Take 80 mg by mouth daily., Disp: , Rfl:  .  gabapentin (NEURONTIN) 300 MG capsule, Take 600 mg by mouth 3 (three) times daily. , Disp: , Rfl:  .  lisinopril (PRINIVIL,ZESTRIL) 2.5 MG tablet, Take 2.5 mg by mouth daily., Disp: , Rfl:  .  magnesium oxide (MAG-OX) 400 MG tablet, Take 400 mg by mouth daily., Disp: , Rfl:  .  metoprolol tartrate (LOPRESSOR) 25 MG tablet, Take 25 mg by mouth 2 (two) times daily., Disp: , Rfl:  .  mirtazapine (REMERON) 30 MG tablet, Take 0.5 tablets by mouth at bedtime., Disp: , Rfl:  .   montelukast (SINGULAIR) 10 MG tablet, Take 10 mg by mouth at bedtime., Disp: , Rfl:  .  Multiple Vitamins-Minerals (MULTIVITAMIN WITH MINERALS) tablet, Take 1 tablet by mouth daily., Disp: , Rfl:  .  ondansetron (ZOFRAN) 4 MG tablet, Take 4 mg by mouth every 8 (eight) hours as needed for nausea., Disp: , Rfl:  .  oxycodone (OXY-IR) 5 MG capsule, Take 5 mg by mouth every 4 (four) hours as needed., Disp: , Rfl:  .  pantoprazole (PROTONIX) 40 MG tablet, Take 40 mg by mouth daily., Disp: , Rfl:  .  pravastatin (PRAVACHOL) 40 MG tablet, Take 40 mg by mouth at bedtime., Disp: , Rfl:  .  spironolactone (ALDACTONE) 25 MG tablet, Take 25 mg by mouth daily., Disp: , Rfl:  .  torsemide (DEMADEX) 20 MG tablet, Take 2 tablets by mouth daily., Disp: , Rfl:  .  umeclidinium-vilanterol (ANORO ELLIPTA) 62.5-25 MCG/INH AEPB, Inhale 1 puff into the lungs daily., Disp: , Rfl:  .  warfarin (COUMADIN) 3 MG tablet, Take 3 mg by mouth 3 (three) times a week., Disp: , Rfl:  .  warfarin (COUMADIN) 4 MG tablet, Take 4 mg by mouth 4 (four) times a week., Disp: , Rfl:  .  warfarin (COUMADIN) 5 MG tablet, Take 5 mg by mouth as directed. Taking 5  mg M, W, F and 4 mg T, Th, Sat, Sun., Disp: , Rfl:   Past Medical History: Past Medical History:  Diagnosis Date  . Arthritis   . Chronic back pain   . Diabetes mellitus without complication ([place])   . Hypertension   . Neuropathy     Tobacco Use: Social History   Tobacco Use  Smoking Status Former Smoker  . Packs/day: 1.00  . Years: 45.00  . Pack years: 45.00  . Types: Cigarettes  . Last attempt to quit: 05/28/2005  . Years since quitting: 12.9  Smokeless Tobacco Never Used    Labs: Recent Chemical engineer    Labs for ITP Cardiac and Pulmonary Rehab Latest Ref Rng & Units 08/08/2012 04/28/2013 11/05/2013 05/30/2014 06/02/2014   Cholestrol 0 - 200 mg/dL 105 - 127 64 -   LDLCALC 0 - 100 mg/dL 37 - 74 27 -   HDL 40 - 60 mg/dL 33(L) - 33(L) 15(L) -   Trlycerides 0 - 200  mg/dL 173 - 99 108 -   Hemoglobin A1c 4.2 - 6.3 % - 5.9 - - 5.3       Pulmonary Assessment Scores: Pulmonary Assessment Scores    Row Name 03/24/18 1530 03/26/18 1202       ADL UCSD   ADL Phase  Entry  Entry    SOB Score total  -  51    Rest  -  0    Walk  -  1    Stairs  -  5    Bath  -  1    Dress  -  1    Shop  -  1      CAT Score   CAT Score  -  19      mMRC Score   mMRC Score  3  3       Pulmonary Function Assessment:   Exercise Target Goals: Exercise Program Goal: Individual exercise prescription set using results from initial 6 min walk test and THRR while considering  patient's activity barriers and safety.   Exercise Prescription Goal: Initial exercise prescription builds to 30-45 minutes a day of aerobic activity, 2-3 days per week.  Home exercise guidelines will be given to patient during program as part of exercise prescription that the participant will acknowledge.  Activity Barriers & Risk Stratification: Activity Barriers & Cardiac Risk Stratification - 03/24/18 1548      Activity Barriers & Cardiac Risk Stratification   Activity Barriers  Deconditioning;Muscular Weakness;Shortness of Breath;Decreased Ventricular Function;Balance Concerns;Back Problems       6 Minute Walk: 6 Minute Walk    Row Name 03/24/18 1544         6 Minute Walk   Phase  Initial     Distance  760 feet     Walk Time  4.6 minutes     # of Rest Breaks  3 15 sec, 9 sec, 1 min     MPH  1.88     METS  2.39     RPE  13     Perceived Dyspnea   2     VO2 Peak  8.36     Symptoms  Yes (comment)     Comments  SOB, back and leg pain 8/10     Resting HR  92 bpm     Resting BP  132/64     Resting Oxygen Saturation   95 %     Exercise Oxygen Saturation  during  6 min walk  86 %     Max Ex. HR  132 bpm     Max Ex. BP  136/64     2 Minute Post BP  124/62       Interval HR   1 Minute HR  83     2 Minute HR  81     3 Minute HR  94     4 Minute HR  98     5 Minute HR  117       6 Minute HR  132     2 Minute Post HR  108     Interval Heart Rate?  Yes       Interval Oxygen   Interval Oxygen?  Yes     Baseline Oxygen Saturation %  95 %     1 Minute Oxygen Saturation %  92 %     1 Minute Liters of Oxygen  3 L     2 Minute Oxygen Saturation %  88 % rest break 2:20-2:35     2 Minute Liters of Oxygen  3 L     3 Minute Oxygen Saturation %  89 % rest break 3:14-3:23     3 Minute Liters of Oxygen  3 L     4 Minute Oxygen Saturation %  90 % rest break 4:13-5:13     4 Minute Liters of Oxygen  3 L     5 Minute Oxygen Saturation %  86 %     5 Minute Liters of Oxygen  3 L     6 Minute Oxygen Saturation %  88 %     6 Minute Liters of Oxygen  3 L     2 Minute Post Oxygen Saturation %  90 %     2 Minute Post Liters of Oxygen  3 L       Oxygen Initial Assessment: Oxygen Initial Assessment - 03/24/18 1445      Home Oxygen   Home Oxygen Device  Home Concentrator;Portable Concentrator;E-Tanks    Sleep Oxygen Prescription  Continuous   Now using Trilogy machine versus CPAP   Liters per minute  2    Home Exercise Oxygen Prescription  Continuous    Liters per minute  3    Home at Rest Exercise Oxygen Prescription  Continuous    Liters per minute  2    Compliance with Home Oxygen Use  Yes      Initial 6 min Walk   Oxygen Used  Portable Concentrator;Pulsed      Program Oxygen Prescription   Program Oxygen Prescription  Continuous;E-Tanks    Liters per minute  3      Intervention   Short Term Goals  To learn and exhibit compliance with exercise, home and travel O2 prescription;To learn and understand importance of maintaining oxygen saturations>88%;To learn and demonstrate proper use of respiratory medications;To learn and demonstrate proper pursed lip breathing techniques or other breathing techniques.;To learn and understand importance of monitoring SPO2 with pulse oximeter and demonstrate accurate use of the pulse oximeter.    Long  Term Goals  Exhibits compliance  with exercise, home and travel O2 prescription;Verbalizes importance of monitoring SPO2 with pulse oximeter and return demonstration;Maintenance of O2 saturations>88%;Exhibits proper breathing techniques, such as pursed lip breathing or other method taught during program session;Compliance with respiratory medication;Demonstrates proper use of MDI's       Oxygen Re-Evaluation: Oxygen Re-Evaluation    Row Name 03/26/18 1059 04/21/18  1440           Program Oxygen Prescription   Program Oxygen Prescription  -  Continuous;E-Tanks      Liters per minute  -  3        Home Oxygen   Home Oxygen Device  -  Home Concentrator;Portable Concentrator;E-Tanks      Sleep Oxygen Prescription  -  Continuous      Liters per minute  -  2      Home Exercise Oxygen Prescription  -  Continuous      Liters per minute  -  3      Home at Rest Exercise Oxygen Prescription  -  Continuous      Liters per minute  -  2      Compliance with Home Oxygen Use  -  Yes        Goals/Expected Outcomes   Short Term Goals  To learn and understand importance of maintaining oxygen saturations>88%;To learn and demonstrate proper pursed lip breathing techniques or other breathing techniques.;To learn and understand importance of monitoring SPO2 with pulse oximeter and demonstrate accurate use of the pulse oximeter.;To learn and exhibit compliance with exercise, home and travel O2 prescription  To learn and understand importance of maintaining oxygen saturations>88%;To learn and demonstrate proper pursed lip breathing techniques or other breathing techniques.;To learn and understand importance of monitoring SPO2 with pulse oximeter and demonstrate accurate use of the pulse oximeter.;To learn and exhibit compliance with exercise, home and travel O2 prescription      Long  Term Goals  Exhibits compliance with exercise, home and travel O2 prescription;Verbalizes importance of monitoring SPO2 with pulse oximeter and return  demonstration;Maintenance of O2 saturations>88%;Exhibits proper breathing techniques, such as pursed lip breathing or other method taught during program session  Exhibits compliance with exercise, home and travel O2 prescription;Verbalizes importance of monitoring SPO2 with pulse oximeter and return demonstration;Maintenance of O2 saturations>88%;Exhibits proper breathing techniques, such as pursed lip breathing or other method taught during program session      Comments  Reviewed PLB technique with pt.  Talked about how it work and it's important to maintaining his exercise saturations.    Patient has been checking his oxygen at home regularly. He is practicing his PLB techniques also. He uses his incentive spirometer everyday to help strengthen his breathing. His oxygen has been good when exercising and has been trying to decrease his oxygen. He was out for being sick and had a procedure done so he has been wearing 3 liters, Patient will try to decrease oxygen to 2 liters when exercising.      Goals/Expected Outcomes  Short: Become more profiecient at using PLB.   Long: Become independent at using PLB.  Short: decrease oxygen to 2 liters when exercising. Long: decrease oxygen to 1 liter while exercising.         Oxygen Discharge (Final Oxygen Re-Evaluation): Oxygen Re-Evaluation - 04/21/18 1440      Program Oxygen Prescription   Program Oxygen Prescription  Continuous;E-Tanks    Liters per minute  3      Home Oxygen   Home Oxygen Device  Home Concentrator;Portable Concentrator;E-Tanks    Sleep Oxygen Prescription  Continuous    Liters per minute  2    Home Exercise Oxygen Prescription  Continuous    Liters per minute  3    Home at Rest Exercise Oxygen Prescription  Continuous    Liters per minute  2    Compliance  with Home Oxygen Use  Yes      Goals/Expected Outcomes   Short Term Goals  To learn and understand importance of maintaining oxygen saturations>88%;To learn and demonstrate proper  pursed lip breathing techniques or other breathing techniques.;To learn and understand importance of monitoring SPO2 with pulse oximeter and demonstrate accurate use of the pulse oximeter.;To learn and exhibit compliance with exercise, home and travel O2 prescription    Long  Term Goals  Exhibits compliance with exercise, home and travel O2 prescription;Verbalizes importance of monitoring SPO2 with pulse oximeter and return demonstration;Maintenance of O2 saturations>88%;Exhibits proper breathing techniques, such as pursed lip breathing or other method taught during program session    Comments  Patient has been checking his oxygen at home regularly. He is practicing his PLB techniques also. He uses his incentive spirometer everyday to help strengthen his breathing. His oxygen has been good when exercising and has been trying to decrease his oxygen. He was out for being sick and had a procedure done so he has been wearing 3 liters, Patient will try to decrease oxygen to 2 liters when exercising.    Goals/Expected Outcomes  Short: decrease oxygen to 2 liters when exercising. Long: decrease oxygen to 1 liter while exercising.       Initial Exercise Prescription: Initial Exercise Prescription - 03/24/18 1500      Date of Initial Exercise RX and Referring Provider   Date  03/24/18    Referring Provider  Kelle Darting MD      Oxygen   Oxygen  Continuous    Liters  3      Treadmill   MPH  1.8    Grade  0    Minutes  15    METs  2.38      REL-XR   Level  1    Speed  50    Minutes  15    METs  2      T5 Nustep   Level  1    SPM  80    Minutes  15    METs  2      Prescription Details   Frequency (times per week)  3    Duration  Progress to 45 minutes of aerobic exercise without signs/symptoms of physical distress      Intensity   THRR 40-80% of Max Heartrate  116-141    Ratings of Perceived Exertion  11-13    Perceived Dyspnea  0-4      Progression   Progression  Continue to  progress workloads to maintain intensity without signs/symptoms of physical distress.      Resistance Training   Training Prescription  Yes    Weight  3 lbs    Reps  10-15       Perform Capillary Blood Glucose checks as needed.  Exercise Prescription Changes: Exercise Prescription Changes    Row Name 03/24/18 1500 04/02/18 1200 04/16/18 1100 04/30/18 1200       Response to Exercise   Blood Pressure (Admit)  132/64  110/70  96/58  110/70    Blood Pressure (Exercise)  136/64  118/70  -  -    Blood Pressure (Exit)  124/62  102/70  120/66  106/58    Heart Rate (Admit)  92 bpm  72 bpm  78 bpm  92 bpm    Heart Rate (Exercise)  132 bpm  95 bpm  86 bpm  145 bpm probably not correct    Heart Rate (Exit)  108 bpm  72 bpm  87 bpm  87 bpm    Oxygen Saturation (Admit)  95 %  92 %  95 %  95 %    Oxygen Saturation (Exercise)  86 %  92 %  87 %  93 %    Oxygen Saturation (Exit)  90 %  90 %  90 %  95 %    Rating of Perceived Exertion (Exercise)  13  12  13  15     Perceived Dyspnea (Exercise)  2  3  2  3     Symptoms  back and leg pain 8/10, SOB  -  -  -    Comments  walk test results  -  -  -    Duration  -  Progress to 45 minutes of aerobic exercise without signs/symptoms of physical distress  Progress to 45 minutes of aerobic exercise without signs/symptoms of physical distress  Progress to 45 minutes of aerobic exercise without signs/symptoms of physical distress    Intensity  -  THRR unchanged  THRR unchanged  THRR unchanged      Progression   Progression  -  Continue to progress workloads to maintain intensity without signs/symptoms of physical distress.  Continue to progress workloads to maintain intensity without signs/symptoms of physical distress.  Continue to progress workloads to maintain intensity without signs/symptoms of physical distress.    Average METs  -  2.4  3.2  2.4      Resistance Training   Training Prescription  -  Yes  Yes  Yes    Weight  -  3 lb  3 lb  3 lb    Reps  -   10-15  10-15  10-15      Interval Training   Interval Training  -  No  No  No      Oxygen   Oxygen  -  Continuous  Continuous  Continuous    Liters  -  3  4  3       Treadmill   MPH  -  1.8  1.8  1.8    Grade  -  0  0  0    Minutes  -  15  15  15     METs  -  2.38  2.38  2.38      NuStep   Level  -  -  1  -    SPM  -  -  80  -    Minutes  -  -  15  -    METs  -  -  1.8  -      REL-XR   Level  -  4  1  1     Speed  -  50  50  50    Minutes  -  15  15  15     METs  -  3.1  5.4  2.4      T5 Nustep   Level  -  1  -  -    SPM  -  80  -  -    Minutes  -  15  -  -    METs  -  1.7  -  -       Exercise Comments: Exercise Comments    Row Name 03/26/18 1057           Exercise Comments  First full day of exercise!  Patient was oriented to gym and equipment  including functions, settings, policies, and procedures.  Patient's individual exercise prescription and treatment plan were reviewed.  All starting workloads were established based on the results of the 6 minute walk test done at initial orientation visit.  The plan for exercise progression was also introduced and progression will be customized based on patient's performance and goals.          Exercise Goals and Review: Exercise Goals    Row Name 03/24/18 1552             Exercise Goals   Increase Physical Activity  Yes       Intervention  Provide advice, education, support and counseling about physical activity/exercise needs.;Develop an individualized exercise prescription for aerobic and resistive training based on initial evaluation findings, risk stratification, comorbidities and participant's personal goals.       Expected Outcomes  Short Term: Attend rehab on a regular basis to increase amount of physical activity.;Long Term: Add in home exercise to make exercise part of routine and to increase amount of physical activity.;Long Term: Exercising regularly at least 3-5 days a week.       Increase Strength and  Stamina  Yes       Intervention  Provide advice, education, support and counseling about physical activity/exercise needs.;Develop an individualized exercise prescription for aerobic and resistive training based on initial evaluation findings, risk stratification, comorbidities and participant's personal goals.       Expected Outcomes  Short Term: Increase workloads from initial exercise prescription for resistance, speed, and METs.;Short Term: Perform resistance training exercises routinely during rehab and add in resistance training at home;Long Term: Improve cardiorespiratory fitness, muscular endurance and strength as measured by increased METs and functional capacity (6MWT)       Able to understand and use rate of perceived exertion (RPE) scale  Yes       Intervention  Provide education and explanation on how to use RPE scale       Expected Outcomes  Long Term:  Able to use RPE to guide intensity level when exercising independently;Short Term: Able to use RPE daily in rehab to express subjective intensity level       Able to understand and use Dyspnea scale  Yes       Intervention  Provide education and explanation on how to use Dyspnea scale       Expected Outcomes  Long Term: Able to use Dyspnea scale to guide intensity level when exercising independently;Short Term: Able to use Dyspnea scale daily in rehab to express subjective sense of shortness of breath during exertion       Knowledge and understanding of Target Heart Rate Range (THRR)  Yes       Intervention  Provide education and explanation of THRR including how the numbers were predicted and where they are located for reference       Expected Outcomes  Short Term: Able to state/look up THRR;Short Term: Able to use daily as guideline for intensity in rehab;Long Term: Able to use THRR to govern intensity when exercising independently       Able to check pulse independently  Yes       Intervention  Provide education and demonstration on how  to check pulse in carotid and radial arteries.;Review the importance of being able to check your own pulse for safety during independent exercise       Expected Outcomes  Short Term: Able to explain why pulse checking is important during independent exercise;Long Term: Able  to check pulse independently and accurately       Understanding of Exercise Prescription  Yes       Intervention  Provide education, explanation, and written materials on patient's individual exercise prescription       Expected Outcomes  Short Term: Able to explain program exercise prescription;Long Term: Able to explain home exercise prescription to exercise independently          Exercise Goals Re-Evaluation : Exercise Goals Re-Evaluation    [place] Name 03/26/18 1058 04/16/18 1201 04/30/18 1229         Exercise Goal Re-Evaluation   Exercise Goals Review  Increase Physical Activity;Able to understand and use rate of perceived exertion (RPE) scale;Knowledge and understanding of Target Heart Rate Range (THRR);Understanding of Exercise Prescription;Increase Strength and Stamina;Able to understand and use Dyspnea scale  Increase Physical Activity;Increase Strength and Stamina;Able to understand and use rate of perceived exertion (RPE) scale;Able to understand and use Dyspnea scale;Knowledge and understanding of Target Heart Rate Range (THRR);Understanding of Exercise Prescription  Increase Physical Activity;Increase Strength and Stamina;Able to understand and use rate of perceived exertion (RPE) scale;Able to understand and use Dyspnea scale;Knowledge and understanding of Target Heart Rate Range (THRR);Understanding of Exercise Prescription     Comments  Reviewed RPE scale, THR and program prescription with pt today.  Pt voiced understanding and was given a copy of goals to take home.   Paukis having a procedure for his back tomorrow that he hopes will help with back pain.  He states he wants to work as much as he can and back pain  inhibits his improvement.  Dalonte stated he can feel the difference this week with exercise.  He had injections for back pain that are helping him.      Expected Outcomes  Short: Use RPE daily to regulate intensity. Long: Follow program prescription in THR.  Short - get clearance afte rprocedure to return to exercise Long - maintain or improve MET level  Short - attend consistently Long - increase endurance and MET level        Discharge Exercise Prescription (Final Exercise Prescription Changes): Exercise Prescription Changes - 04/30/18 1200      Response to Exercise   Blood Pressure (Admit)  110/70    Blood Pressure (Exit)  106/58    Heart Rate (Admit)  92 bpm    Heart Rate (Exercise)  145 bpm   probably not correct   Heart Rate (Exit)  87 bpm    Oxygen Saturation (Admit)  95 %    Oxygen Saturation (Exercise)  93 %    Oxygen Saturation (Exit)  95 %    Rating of Perceived Exertion (Exercise)  15    Perceived Dyspnea (Exercise)  3    Duration  Progress to 45 minutes of aerobic exercise without signs/symptoms of physical distress    Intensity  THRR unchanged      Progression   Progression  Continue to progress workloads to maintain intensity without signs/symptoms of physical distress.    Average METs  2.4      Resistance Training   Training Prescription  Yes    Weight  3 lb    Reps  10-15      Interval Training   Interval Training  No      Oxygen   Oxygen  Continuous    Liters  3      Treadmill   MPH  1.8    Grade  0    Minutes  15    METs  2.38      REL-XR   Level  1    Speed  50    Minutes  15    METs  2.4       Nutrition:  Target Goals: Understanding of nutrition guidelines, daily intake of sodium <157m, cholesterol <2041m calories 30% from fat and 7% or less from saturated fats, daily to have 5 or more servings of fruits and vegetables.  Biometrics: Pre Biometrics - 03/24/18 1553      Pre Biometrics   Height  5' 9"  (1.753 m)    Weight  200 lb 3.2 oz  (90.8 kg)    Waist Circumference  42.5 inches    Hip Circumference  42 inches    Waist to Hip Ratio  1.01 %    BMI (Calculated)  29.55    Single Leg Stand  0.73 seconds        Nutrition Therapy Plan and Nutrition Goals: Nutrition Therapy & Goals - 04/14/18 1128      Nutrition Therapy   Diet  DM    Drug/Food Interactions  Coumadin/Vit K    Protein (specify units)  12oz    Fiber  30 grams    Whole Grain Foods  3 servings   main source of whole grains in diet is oatmeal   Saturated Fats  15 max. grams    Fruits and Vegetables  6 servings/day   8 ideal; eats more vegetables than fruits   Sodium  1500 grams      Personal Nutrition Goals   Nutrition Goal  Work towards choosing snacks that are more nutient-dense and lower in added sugars    Personal Goal #2  Because you do not eat much meat, continue consistent daily intake of Boost nutritional drinks and try to include other dietary sources of protein such as beans, lentils, eggs, dairy foods.    Personal Goal #3  Continue to choose lower sugar/ sugar free options for items like syrup and beverages and choose foods with natural sugar instead like fruit. Great job!    Comments  He feels that his DM type II is controlled, and tries to "eat healthy." He and his wife limit fried foods, salt and sugar. Breakfast: eggs, grits, oatmeal, pancakes 1x/wk with SF syrup. Lunch: 1/2 sandwich, soup, nabs. Dinner: eats at home mostly; vegetables (Frozen not canned), beans, some meats (chicken/ roast). Beverages: diet coke, water, unsweetened tea, OJ at breakfast. Occasionally has snacks like honey buns or oatmeal cream pies @[facility] . Takes Mg, K+, Vitamin B12, and MVI daily. Takes Remeron prn and Coumadin as prescribed. He is familiar with foods high in Vitamin K. He would like to regain strength/muscle mass and lose 15#.       Intervention Plan   Intervention  Prescribe, educate and counsel regarding individualized specific dietary modifications aiming  towards targeted core components such as weight, hypertension, lipid management, diabetes, heart failure and other comorbidities.    Expected Outcomes  Short Term Goal: Understand basic principles of dietary content, such as calories, fat, sodium, cholesterol and nutrients.;Long Term Goal: Adherence to prescribed nutrition plan.;Short Term Goal: A plan has been developed with personal nutrition goals set during dietitian appointment.       Nutrition Assessments: Nutrition Assessments - 03/26/18 1205      MEDFICTS Scores   Pre Score  60       Nutrition Goals Re-Evaluation: Nutrition Goals Re-Evaluation    RoDel Rioame 04/14/18 1142  Goals   Nutrition Goal  Work towards choosing snacks that are more nutrient-dense and lower in added sugars       Comment  He is mindful about added sugar intake in other areas of his diet but in the evenings his snacks tend to be more processed and less nutritious       Expected Outcome  He will work to find more nutritious /lower sugar options for HS snacks, or reduce the frequency that he has the full-sugar varieties         Personal Goal #2 Re-Evaluation   Personal Goal #2  Because you do not eat much meat, continue consistent daily intake of Boost nutritional drinks and try to include other dietary sources of protein such as beans, lentils, eggs, dairy foods.         Personal Goal #3 Re-Evaluation   Personal Goal #3  Continue to choose lower sugar/ sugar free options for items like syrup and beverages and choose foods with natural sugar instead like fruit. Great job!           Nutrition Goals Discharge (Final Nutrition Goals Re-Evaluation): Nutrition Goals Re-Evaluation - 04/14/18 1142      Goals   Nutrition Goal  Work towards choosing snacks that are more nutrient-dense and lower in added sugars    Comment  He is mindful about added sugar intake in other areas of his diet but in the evenings his snacks tend to be more processed and less  nutritious    Expected Outcome  He will work to find more nutritious /lower sugar options for HS snacks, or reduce the frequency that he has the full-sugar varieties      Personal Goal #2 Re-Evaluation   Personal Goal #2  Because you do not eat much meat, continue consistent daily intake of Boost nutritional drinks and try to include other dietary sources of protein such as beans, lentils, eggs, dairy foods.      Personal Goal #3 Re-Evaluation   Personal Goal #3  Continue to choose lower sugar/ sugar free options for items like syrup and beverages and choose foods with natural sugar instead like fruit. Great job!        Psychosocial: Target Goals: Acknowledge presence or absence of significant depression and/or stress, maximize coping skills, provide positive support system. Participant is able to verbalize types and ability to use techniques and skills needed for reducing stress and depression.   Initial Review & Psychosocial Screening: Initial Psych Review & Screening - 03/24/18 1446      Initial Review   Current issues with  History of Depression;Current Psychotropic Meds;Current Stress Concerns;Current Sleep Concerns    Source of Stress Concerns  Chronic Illness;Unable to perform yard/household activities    Comments  Changed pysch meds since last seen, waking up frequently at night, newly diagnosis lung cancer, breathing continues to be a struggle      [place]?  Yes    Comments  Mr Brands has great support from his wife, daughter, and son.       Barriers   Psychosocial barriers to participate in program  Psychosocial barriers identified (see note);The patient should benefit from training in stress management and relaxation.      Screening Interventions   Interventions  Encouraged to exercise;Provide feedback about the scores to participant;Program counselor consult;To provide support and resources with identified psychosocial needs    Expected  Outcomes  Short Term goal: Utilizing psychosocial counselor, staff  and physician to assist with identification of specific Stressors or current issues interfering with healing process. Setting desired goal for each stressor or current issue identified.;Long Term Goal: Stressors or current issues are controlled or eliminated.;Short Term goal: Identification and review with participant of any Quality of Life or Depression concerns found by scoring the questionnaire.;Long Term goal: The participant improves quality of Life and PHQ9 Scores as seen by post scores and/or verbalization of changes       Quality of Life Scores:  Scores of 19 and below usually indicate a poorer quality of life in these areas.  A difference of  2-3 points is a clinically meaningful difference.  A difference of 2-3 points in the total score of the Quality of Life Index has been associated with significant improvement in overall quality of life, self-image, physical symptoms, and general health in studies assessing change in quality of life.  PHQ-9: Recent Review Flowsheet Data    Depression screen [facility] 2/9 03/26/2018 03/24/2018 12/11/2017 11/20/2017 10/22/2017   Decreased Interest 2 1 0 0 2   Down, Depressed, Hopeless 1 1 1  0 2   PHQ - 2 Score 3 2 1  0 4   Altered sleeping 1 1 0 - 2   Tired, decreased energy 1 3 1  - 2   Change in appetite 1 1  1  - 2   Feeling bad or failure about yourself  0 0 0 - 2   Trouble concentrating 0 0 0 - 2   Moving slowly or fidgety/restless 1 0 0 - 2   Suicidal thoughts 0 0 0 - 0   PHQ-9 Score 7 7 3  - 16   Difficult doing work/chores Somewhat difficult Very difficult Not difficult at all - Somewhat difficult     Interpretation of Total Score  Total Score Depression Severity:  1-4 = Minimal depression, 5-9 = Mild depression, 10-14 = Moderate depression, 15-19 = Moderately severe depression, 20-27 = Severe depression   Psychosocial Evaluation and Intervention: Psychosocial Evaluation - 04/14/18  1127      Psychosocial Evaluation & Interventions   Interventions  Stress management education;Encouraged to exercise with the program and follow exercise prescription;Relaxation education    Comments  Counselor met with Saahas upon returning to this program after being out with several bouts of Pneumonia recently.  He is a 67 year old who has a very strong support system with a spouse and spouse's family as well as his own family who live locally.  He reports having severe back problems; diabetes;  and left lung cancer in addition to the COPD.  He reports sleeping "okay" with ~4-5 hours most nights.  He has a good appetite and reports having minimal stress in his life other than his health.  Jailen reports a history of depression and anxiety and is on medication that he reports is helpful for treating these symptoms.  His goals are to increase his stamina and strength and his core muscles while possibly losing some weight in this program.  Staff will follow with Eddie Dibbles.      Expected Outcomes  Short:  Karee will exercise for his health and mental health to reduce his anxiety and learn ways to relax better.   Long:  Kendrix will exercise consistently to increase his strength and stamina and improve his core muscles to help reduce his back pain.      Continue Psychosocial Services   Follow up required by staff       Psychosocial Re-Evaluation: Psychosocial  Re-Evaluation    Row Name 04/21/18 1445             Psychosocial Re-Evaluation   Current issues with  Current Stress Concerns;Current Depression;History of Depression       Comments  Stevon states that his mood has improved since the start of the program. He sometimes throws himself a pity party but soon stops when he realizes it does not do him any good. He likes coming to exercise to get the benifit and it takes his mind off of his health. He tries really hard in the program and wants to get better,       Expected Outcomes  Short: Attend [name] stress  management education to decrease stress. Long: Maintain exercise Post [name] to keep stress at a minimum.       Interventions  Encouraged to attend Pulmonary Rehabilitation for the exercise;Relaxation education       Continue Psychosocial Services   Follow up required by staff          Psychosocial Discharge (Final Psychosocial Re-Evaluation): Psychosocial Re-Evaluation - 04/21/18 1445      Psychosocial Re-Evaluation   Current issues with  Current Stress Concerns;Current Depression;History of Depression    Comments  Schuyler states that his mood has improved since the start of the program. He sometimes throws himself a pity party but soon stops when he realizes it does not do him any good. He likes coming to exercise to get the benifit and it takes his mind off of his health. He tries really hard in the program and wants to get better,    Expected Outcomes  Short: Attend [name] stress management education to decrease stress. Long: Maintain exercise Post [name] to keep stress at a minimum.    Interventions  Encouraged to attend Pulmonary Rehabilitation for the exercise;Relaxation education    Continue Psychosocial Services   Follow up required by staff       Education: Education Goals: Education classes will be provided on a weekly basis, covering required topics. Participant will state understanding/return demonstration of topics presented.  Learning Barriers/Preferences: Learning Barriers/Preferences - 03/24/18 1448      Learning Barriers/Preferences   Learning Barriers  Sight;Hearing   glasses and hearing aids   Learning Preferences  None       Education Topics:  Initial Evaluation Education: - Verbal, written and demonstration of respiratory meds, oximetry and breathing techniques. Instruction on use of nebulizers and MDIs and importance of monitoring MDI activations.   Pulmonary Rehab from 05/07/2018 in [facility] Cardiac and Pulmonary Rehab  Date  03/24/18  Educator  [facility]    Instruction Review Code  1- Verbalizes Understanding      General Nutrition Guidelines/Fats and Fiber: -Group instruction provided by verbal, written material, models and posters to present the general guidelines for heart healthy nutrition. Gives an explanation and review of dietary fats and fiber.   Pulmonary Rehab from 10/23/2017 in [facility] Cardiac and Pulmonary Rehab  Date  10/14/17  Educator  CR  Instruction Review Code  1- Verbalizes Understanding      Controlling Sodium/Reading Food Labels: -Group verbal and written material supporting the discussion of sodium use in heart healthy nutrition. Review and explanation with models, verbal and written materials for utilization of the food label.   Pulmonary Rehab from 05/07/2018 in [facility] Cardiac and Pulmonary Rehab  Date  04/16/18  Educator  LB  Instruction Review Code  1- Verbalizes Understanding      Exercise Physiology & General Exercise  Guidelines: - Group verbal and written instruction with models to review the exercise physiology of the cardiovascular system and associated critical values. Provides general exercise guidelines with specific guidelines to those with heart or lung disease.    Pulmonary Rehab from 05/07/2018 in [facility] Cardiac and Pulmonary Rehab  Date  05/02/18  Educator  [facility]  Instruction Review Code  5- Refused Teaching      Aerobic Exercise & Resistance Training: - Gives group verbal and written instruction on the various components of exercise. Focuses on aerobic and resistive training programs and the benefits of this training and how to safely progress through these programs.   Cardiac Rehab from 03/21/2016 in [facility] Cardiac and Pulmonary Rehab  Date  02/29/16  Educator  [facility]  Instruction Review Code (retired)  2- Statistician, Balance, Mind/Body Relaxation: Provides group verbal/written instruction on the benefits of flexibility and balance training, including mind/body exercise modes  such as yoga, pilates and tai chi.  Demonstration and skill practice provided.   Cardiac Rehab from 03/21/2016 in [facility] Cardiac and Pulmonary Rehab  Date  01/09/16  Educator  [facility]  Instruction Review Code (retired)  2- meets goals/outcomes      Stress and Anxiety: - Provides group verbal and written instruction about the health risks of elevated stress and causes of high stress.  Discuss the correlation between heart/lung disease and anxiety and treatment options. Review healthy ways to manage with stress and anxiety.   Pulmonary Rehab from 10/23/2017 in [facility] Cardiac and Pulmonary Rehab  Date  10/23/17  Educator  [facility]  Instruction Review Code  1- Verbalizes Understanding      Depression: - Provides group verbal and written instruction on the correlation between heart/lung disease and depressed mood, treatment options, and the stigmas associated with seeking treatment.   Exercise & Equipment Safety: - Individual verbal instruction and demonstration of equipment use and safety with use of the equipment.   Pulmonary Rehab from 05/07/2018 in [facility] Cardiac and Pulmonary Rehab  Date  03/24/18  Educator  [facility]  Instruction Review Code  1- Verbalizes Understanding      Infection Prevention: - Provides verbal and written material to individual with discussion of infection control including proper hand washing and proper equipment cleaning during exercise session.   Pulmonary Rehab from 05/07/2018 in [facility] Cardiac and Pulmonary Rehab  Date  03/24/18  Educator  [facility]  Instruction Review Code  1- Verbalizes Understanding      Falls Prevention: - Provides verbal and written material to individual with discussion of falls prevention and safety.   Pulmonary Rehab from 05/07/2018 in [facility] Cardiac and Pulmonary Rehab  Date  03/24/18  Educator  [facility]  Instruction Review Code  1- Verbalizes Understanding      Diabetes: - Individual verbal and written instruction to review signs/symptoms of diabetes,  desired ranges of glucose level fasting, after meals and with exercise. Advice that pre and post exercise glucose checks will be done for 3 sessions at entry of program.   Cardiac Rehab from 03/21/2016 in [facility] Cardiac and Pulmonary Rehab  Date  01/02/16  Educator  D. Joya Gaskins, RN  Instruction Review Code (retired)  2- meets goals/outcomes      Chronic Lung Diseases: - Group verbal and written instruction to review updates, respiratory medications, advancements in procedures and treatments. Discuss use of supplemental oxygen including available portable oxygen systems, continuous and intermittent flow rates, concentrators, personal use and safety guidelines. Review proper use of inhaler  and spacers. Provide informative websites for self-education.    Pulmonary Rehab from 05/07/2018 in [facility] Cardiac and Pulmonary Rehab  Date  04/30/18  Educator  [facility]  Instruction Review Code  1- Verbalizes Understanding      Energy Conservation: - Provide group verbal and written instruction for methods to conserve energy, plan and organize activities. Instruct on pacing techniques, use of adaptive equipment and posture/positioning to relieve shortness of breath.   Pulmonary Rehab from 10/23/2017 in [facility] Cardiac and Pulmonary Rehab  Date  10/16/17  Educator  [facility]  Instruction Review Code  1- Verbalizes Understanding      Triggers and Exacerbations: - Group verbal and written instruction to review types of environmental triggers and ways to prevent exacerbations. Discuss weather changes, air quality and the benefits of nasal washing. Review warning signs and symptoms to help prevent infections. Discuss techniques for effective airway clearance, coughing, and vibrations.   AED/CPR: - Group verbal and written instruction with the use of models to demonstrate the basic use of the AED with the basic ABC's of resuscitation.   Pulmonary Rehab from 10/23/2017 in [facility] Cardiac and Pulmonary Rehab  Date  10/18/17    Educator  [facility]  Instruction Review Code  1- Verbalizes Understanding      Anatomy and Physiology of the Lungs: - Group verbal and written instruction with the use of models to provide basic lung anatomy and physiology related to function, structure and complications of lung disease.   Anatomy & Physiology of the Heart: - Group verbal and written instruction and models provide basic cardiac anatomy and physiology, with the coronary electrical and arterial systems. Review of Valvular disease and Heart Failure   Cardiac Rehab from 03/21/2016 in [facility] Cardiac and Pulmonary Rehab  Date  01/16/16  Educator  [facility]  Instruction Review Code (retired)  2- meets goals/outcomes      Cardiac Medications: - Group verbal and written instruction to review commonly prescribed medications for heart disease. Reviews the medication, class of the drug, and side effects.   Pulmonary Rehab from 10/23/2017 in [facility] Cardiac and Pulmonary Rehab  Date  10/04/17  Educator  [facility]  Instruction Review Code  1- Verbalizes Understanding      Know Your Numbers and Risk Factors: -Group verbal and written instruction about important numbers in your health.  Discussion of what are risk factors and how they play a role in the disease process.  Review of Cholesterol, Blood Pressure, Diabetes, and BMI and the role they play in your overall health.   Sleep Hygiene: -Provides group verbal and written instruction about how sleep can affect your health.  Define sleep hygiene, discuss sleep cycles and impact of sleep habits. Review good sleep hygiene tips.    Pulmonary Rehab from 05/07/2018 in [facility] Cardiac and Pulmonary Rehab  Date  05/07/18  Educator  [facility]  Instruction Review Code  1- Verbalizes Understanding      Other: -Provides group and verbal instruction on various topics (see comments)    Knowledge Questionnaire Score: Knowledge Questionnaire Score - 03/26/18 1205      Knowledge Questionnaire Score   Pre Score   15/18   reviewed with patient       Core Components/Risk Factors/Patient Goals at Admission: Personal Goals and Risk Factors at Admission - 03/24/18 1448      Core Components/Risk Factors/Patient Goals on Admission    Weight Management  Yes;Weight Maintenance;Weight Loss    Intervention  Weight Management: Develop a combined nutrition and exercise program  designed to reach desired caloric intake, while maintaining appropriate intake of nutrient and fiber, sodium and fats, and appropriate energy expenditure required for the weight goal.;Weight Management: Provide education and appropriate resources to help participant work on and attain dietary goals.;Weight Management/Obesity: Establish reasonable short term and long term weight goals.;Obesity: Provide education and appropriate resources to help participant work on and attain dietary goals.    Admit Weight  200 lb 3.2 oz (90.8 kg)    Goal Weight: Short Term  195 lb (88.5 kg)    Goal Weight: Long Term  185 lb (83.9 kg)    Expected Outcomes  Short Term: Continue to assess and modify interventions until short term weight is achieved;Long Term: Adherence to nutrition and physical activity/exercise program aimed toward attainment of established weight goal;Weight Loss: Understanding of general recommendations for a balanced deficit meal plan, which promotes 1-2 lb weight loss per week and includes a negative energy balance of [phone] kcal/d;Understanding recommendations for meals to include 15-35% energy as protein, 25-35% energy from fat, 35-60% energy from carbohydrates, less than 249m of dietary cholesterol, 20-35 gm of total fiber daily;Understanding of distribution of calorie intake throughout the day with the consumption of 4-5 meals/snacks    Improve shortness of breath with ADL's  Yes    Intervention  Provide education, individualized exercise plan and daily activity instruction to help decrease symptoms of SOB with activities of daily living.     Expected Outcomes  Short Term: Improve cardiorespiratory fitness to achieve a reduction of symptoms when performing ADLs;Long Term: Be able to perform more ADLs without symptoms or delay the onset of symptoms    Heart Failure  Yes    Intervention  Provide a combined exercise and nutrition program that is supplemented with education, support and counseling about heart failure. Directed toward relieving symptoms such as shortness of breath, decreased exercise tolerance, and extremity edema.    Expected Outcomes  Improve functional capacity of life;Short term: Attendance in program 2-3 days a week with increased exercise capacity. Reported lower sodium intake. Reported increased fruit and vegetable intake. Reports medication compliance.;Short term: Daily weights obtained and reported for increase. Utilizing diuretic protocols set by physician.;Long term: Adoption of self-care skills and reduction of barriers for early signs and symptoms recognition and intervention leading to self-care maintenance.    Hypertension  Yes    Intervention  Provide education on lifestyle modifcations including regular physical activity/exercise, weight management, moderate sodium restriction and increased consumption of fresh fruit, vegetables, and low fat dairy, alcohol moderation, and smoking cessation.;Monitor prescription use compliance.    Expected Outcomes  Short Term: Continued assessment and intervention until BP is < 140/937mHG in hypertensive participants. < 130/8018mG in hypertensive participants with diabetes, heart failure or chronic kidney disease.;Long Term: Maintenance of blood pressure at goal levels.    Lipids  Yes    Intervention  Provide education and support for participant on nutrition & aerobic/resistive exercise along with prescribed medications to achieve LDL <21m38mDL >40mg48m Expected Outcomes  Short Term: Participant states understanding of desired cholesterol values and is compliant with  medications prescribed. Participant is following exercise prescription and nutrition guidelines.;Long Term: Cholesterol controlled with medications as prescribed, with individualized exercise RX and with personalized nutrition plan. Value goals: LDL < 21mg,47m > 40 mg.       Core Components/Risk Factors/Patient Goals Review:  Goals and Risk Factor Review    Row Name 04/21/18 [id number]  Core Components/Risk Factors/Patient Goals Review   Personal Goals Review  Hypertension;Diabetes;Improve shortness of breath with ADL's;Weight Management/Obesity       Review  Momodou finds it hard to do things at home due to his back pain. He has recently had shots in his back to help. He says that he may get a procedure to burn the nerves in his back so it can reduce his pain. His blood pressure has been good. He has not been hypertensive. He is checking his blood sugar at home and continues to do so.        Expected Outcomes  Short: Attend [name] regularly to improve shortness of breath with ADL's. Long: maintain independence with ADL's           Core Components/Risk Factors/Patient Goals at Discharge (Final Review):  Goals and Risk Factor Review - 04/21/18 1449      Core Components/Risk Factors/Patient Goals Review   Personal Goals Review  Hypertension;Diabetes;Improve shortness of breath with ADL's;Weight Management/Obesity    Review  Clerance finds it hard to do things at home due to his back pain. He has recently had shots in his back to help. He says that he may get a procedure to burn the nerves in his back so it can reduce his pain. His blood pressure has been good. He has not been hypertensive. He is checking his blood sugar at home and continues to do so.     Expected Outcomes  Short: Attend [name] regularly to improve shortness of breath with ADL's. Long: maintain independence with ADL's        ITP Comments: ITP Comments    Row Name 03/24/18 1534 04/14/18 0826 05/12/18 0820        ITP Comments  Medical evaluation completed today.  Pt started on visit 15 today.  New ITP created and sent for review to Dr. Emily Filbert, Medical Director.  Documentation for diagnosis from [facility] encounter 03/18/18 and 09/16/17.  30 day review completed. ITP sent to Dr. Emily Filbert Director of [place]. Continue with ITP unless changes are made by physician.  30 day review completed. ITP sent to Dr. Emily Filbert Director of [place]. Continue with ITP unless changes are made by physician.        Comments: 30 day review

## 2018-05-19 ENCOUNTER — Telehealth: Payer: Self-pay | Admitting: *Deleted

## 2018-05-19 NOTE — Telephone Encounter (Signed)
Aceyn called to let us know that he has been out with various family issues. He also still has some Christmas shopping to do and plans to return on Friday.

## 2018-05-23 ENCOUNTER — Encounter: Payer: Medicare Other | Admitting: *Deleted

## 2018-05-23 DIAGNOSIS — J449 Chronic obstructive pulmonary disease, unspecified: Secondary | ICD-10-CM | POA: Diagnosis not present

## 2018-05-23 NOTE — Progress Notes (Signed)
Daily Session Note  Patient Details  Name: William Bray MRN: [id number] Date of Birth: 20-Aug-1950 Referring Provider:     Pulmonary Rehab from 03/24/2018 in [facility] Cardiac and Pulmonary Rehab  Referring Provider  Kelle Darting MD      Encounter Date: 05/23/2018  Check In: Session Check In - 05/23/18 1015      Check-In   Supervising physician immediately available to respond to emergencies  [name] immediately available ER MD    Physician(s)  Drs. Paduchowski and [facility], [facility]    Staff Present  Renita Papa, RN BSN;Laureen Owens Shark, BS, RRT, Respiratory Dareen Piano, BA, ACSM CEP, Exercise Physiologist    Medication changes reported      No    Fall or balance concerns reported     No    Warm-up and Cool-down  Performed as group-led instruction    Resistance Training Performed  Yes    VAD Patient?  Yes    PAD/SET Patient?  No      VAD patient   Has back up controller?  Yes    Has spare charged batteries?  Yes    Has battery cables?  Yes    Has compatible battery clips?  Yes      Pain Assessment   Currently in Pain?  No/denies          Social History   Tobacco Use  Smoking Status Former Smoker  . Packs/day: 1.00  . Years: 45.00  . Pack years: 45.00  . Types: Cigarettes  . Last attempt to quit: 05/28/2005  . Years since quitting: 12.9  Smokeless Tobacco Never Used    Goals Met:  Proper associated with RPD/PD & O2 Sat Independence with exercise equipment Using PLB without cueing & demonstrates good technique Exercise tolerated well No report of cardiac concerns or symptoms Strength training completed today  Goals Unmet:  Not Applicable  Comments: Pt able to follow exercise prescription today without complaint.  Will continue to monitor for progression.    Dr. Emily Filbert is Medical Director for [place] and [name].

## 2018-05-26 DIAGNOSIS — J449 Chronic obstructive pulmonary disease, unspecified: Secondary | ICD-10-CM

## 2018-05-26 NOTE — Progress Notes (Signed)
Daily Session Note  Patient Details  Name: William Bray MRN: [id number] Date of Birth: 01/10/1951 Referring Provider:     Pulmonary Rehab from 03/24/2018 in [facility] Cardiac and Pulmonary Rehab  Referring Provider  Kelle Darting MD      Encounter Date: 05/26/2018  Check In: Session Check In - 05/26/18 1014      Check-In   Supervising physician immediately available to respond to emergencies  [name] immediately available ER MD    Physician(s)  Dr. Cinda Quest and Quentin Cornwall    Location  [facility]-Cardiac & Pulmonary Rehab    Staff Present  Jasper Loser BS, Exercise Physiologist;Adriahna Shearman Foy [place], [place], ACSM CEP, Exercise Physiologist    Medication changes reported      No    Fall or balance concerns reported     No    Warm-up and Cool-down  Performed as group-led instruction    Resistance Training Performed  Yes    VAD Patient?  Yes    PAD/SET Patient?  No      VAD patient   Has back up controller?  Yes    Has spare charged batteries?  Yes    Has battery cables?  Yes    Has compatible battery clips?  Yes      Pain Assessment   Currently in Pain?  No/denies          Social History   Tobacco Use  Smoking Status Former Smoker  . Packs/day: 1.00  . Years: 45.00  . Pack years: 45.00  . Types: Cigarettes  . Last attempt to quit: 05/28/2005  . Years since quitting: 13.0  Smokeless Tobacco Never Used    Goals Met:  Independence with exercise equipment Exercise tolerated well No report of cardiac concerns or symptoms Strength training completed today  Goals Unmet:  Not Applicable  Comments: Pt able to follow exercise prescription today without complaint.  Will continue to monitor for progression.    Dr. Emily Filbert is Medical Director for [place] and [name].

## 2018-05-30 ENCOUNTER — Encounter: Payer: Medicare Other | Attending: Pulmonary Disease

## 2018-05-30 DIAGNOSIS — E119 Type 2 diabetes mellitus without complications: Secondary | ICD-10-CM | POA: Diagnosis not present

## 2018-05-30 DIAGNOSIS — Z7901 Long term (current) use of anticoagulants: Secondary | ICD-10-CM | POA: Insufficient documentation

## 2018-05-30 DIAGNOSIS — I10 Essential (primary) hypertension: Secondary | ICD-10-CM | POA: Insufficient documentation

## 2018-05-30 DIAGNOSIS — Z7982 Long term (current) use of aspirin: Secondary | ICD-10-CM | POA: Diagnosis not present

## 2018-05-30 DIAGNOSIS — J449 Chronic obstructive pulmonary disease, unspecified: Secondary | ICD-10-CM | POA: Insufficient documentation

## 2018-05-30 DIAGNOSIS — Z79899 Other long term (current) drug therapy: Secondary | ICD-10-CM | POA: Insufficient documentation

## 2018-05-30 DIAGNOSIS — Z79891 Long term (current) use of opiate analgesic: Secondary | ICD-10-CM | POA: Diagnosis not present

## 2018-05-30 DIAGNOSIS — Z87891 Personal history of nicotine dependence: Secondary | ICD-10-CM | POA: Insufficient documentation

## 2018-05-30 NOTE — Progress Notes (Signed)
Daily Session Note  Patient Details  Name: William Bray MRN: [id number] Date of Birth: [birth year] Referring Provider:     Pulmonary Rehab from 03/24/2018 [facility]  Referring Provider  William Darting MD      Encounter Date: 05/30/2018  Check In: Session Check In - 05/30/18 0954      Check-In   Supervising physician immediately available to respond to emergencies  [name] immediately available ER MD    Physician(s)  Dr. Cinda Bray and William Bray    Location  [facility]-Cardiac & Pulmonary Rehab    Staff Present  William Bray RCP,RRT,BSRT;William Bray, BA, ACSM CEP, Exercise Physiologist;William Sherryll Burger, RN BSN    Medication changes reported      No    Fall or balance concerns reported     No    Warm-up and Cool-down  Performed as group-led Higher education careers adviser Performed  Yes    VAD Patient?  Yes      VAD patient   Has back up controller?  Yes    Has spare charged batteries?  Yes    Has battery cables?  Yes    Has compatible battery clips?  Yes      Pain Assessment   Currently in Pain?  No/denies          Social History   Tobacco Use  Smoking Status Former Smoker  . Packs/day: 1.00  . Years: 45.00  . Pack years: 45.00  . Types: Cigarettes  . Last attempt to quit: 05/28/2005  . Years since quitting: 13.0  Smokeless Tobacco Never Used    Goals Met:  Independence with exercise equipment Exercise tolerated well No report of cardiac concerns or symptoms Strength training completed today  Goals Unmet:  Not Applicable  Comments: Pt able to follow exercise prescription today without complaint.  Will continue to monitor for progression.    Dr. Emily Bray is Medical Director for [name] and [name].

## 2018-06-02 ENCOUNTER — Encounter: Payer: Medicare Other | Admitting: *Deleted

## 2018-06-02 DIAGNOSIS — J449 Chronic obstructive pulmonary disease, unspecified: Secondary | ICD-10-CM | POA: Diagnosis not present

## 2018-06-02 DIAGNOSIS — I5022 Chronic systolic (congestive) heart failure: Secondary | ICD-10-CM

## 2018-06-02 DIAGNOSIS — Z95811 Presence of heart assist device: Secondary | ICD-10-CM

## 2018-06-02 NOTE — Progress Notes (Signed)
Daily Session Note  Patient Details  Name: William Bray MRN: [id number] Date of Birth: [birth year] Referring Provider:     Pulmonary Rehab from 03/24/2018 in [facility] Card[facility] and Pulmonary Rehab  Referring Provider  Kelle Darting MD      Encounter Date: 06/02/2018  Check In: Session Check In - 06/02/18 1010      Check-In   Supervising physician immediately available to respond to emergencies  [name] immediately available ER MD    Physician(s)  Dr. Joni Fears and Dr. Kerman Passey    Location  [facility]-Cardiac & Pulmonary Rehab    Staff Present  Earlean Shawl, BS, ACSM CEP, Exercise Physiologist;Joseph [facility], [place], ACSM CEP, Exercise Physiologist    Medication changes reported      No    Fall or balance concerns reported     No    Tobacco Cessation  No Change    Warm-up and Cool-down  Performed as group-led instruction    Resistance Training Performed  Yes    VAD Patient?  Yes    PAD/SET Patient?  No      VAD patient   Has back up controller?  Yes    Has spare charged batteries?  Yes    Has battery cables?  Yes    Has compatible battery clips?  Yes      Pain Assessment   Currently in Pain?  No/denies    Multiple Pain Sites  No          Social History   Tobacco Use  Smoking Status Former Smoker  . Packs/day: 1.00  . Years: 45.00  . Pack years: 45.00  . Types: Cigarettes  . Last attempt to quit: 05/28/2005  . Years since quitting: 13.0  Smokeless Tobacco Never Used    Goals Met:  Proper associated with RPD/PD & O2 Sat Independence with exercise equipment Exercise tolerated well No report of cardiac concerns or symptoms Strength training completed today  Goals Unmet:  Not Applicable  Comments: Pt able to follow exercise prescription today without complaint.  Will continue to monitor for progression.    Dr. Emily Filbert is Medical Director for [place] and [name].

## 2018-06-06 DIAGNOSIS — J449 Chronic obstructive pulmonary disease, unspecified: Secondary | ICD-10-CM

## 2018-06-06 NOTE — Progress Notes (Signed)
Daily Session Note  Patient Details  Name: William Bray MRN: [id number] Date of Birth: 1950-10-30 Referring Provider:     Pulmonary Rehab from 03/24/2018 in [facility] Cardiac and Pulmonary Rehab  Referring Provider  Kelle Darting MD      Encounter Date: 06/06/2018  Check In: Session Check In - 06/06/18 1012      Check-In   Supervising physician immediately available to respond to emergencies  [name] immediately available ER MD    Physician(s)  Dr.Robinson and Alfred Levins    Location  [facility]-Cardiac & Pulmonary Rehab    Staff Present  Justin Mend RCP,RRT,BSRT;Amanda Oletta Darter, [place], ACSM CEP, Exercise Physiologist    Medication changes reported      No    Fall or balance concerns reported     No    Warm-up and Cool-down  Performed as group-led instruction    Resistance Training Performed  Yes    VAD Patient?  Yes    PAD/SET Patient?  No      VAD patient   Has back up controller?  Yes    Has spare charged batteries?  Yes    Has battery cables?  Yes    Has compatible battery clips?  Yes      Pain Assessment   Currently in Pain?  No/denies          Social History   Tobacco Use  Smoking Status Former Smoker  . Packs/day: 1.00  . Years: 45.00  . Pack years: 45.00  . Types: Cigarettes  . Last attempt to quit: 05/28/2005  . Years since quitting: 13.0  Smokeless Tobacco Never Used    Goals Met:  Independence with exercise equipment Exercise tolerated well No report of cardiac concerns or symptoms Strength training completed today  Goals Unmet:  Not Applicable  Comments: Pt able to follow exercise prescription today without complaint.  Will continue to monitor for progression.    Dr. Emily Filbert is Medical Director for [place] and [name].

## 2018-06-09 ENCOUNTER — Encounter: Payer: Medicare Other | Admitting: *Deleted

## 2018-06-09 DIAGNOSIS — I5022 Chronic systolic (congestive) heart failure: Secondary | ICD-10-CM

## 2018-06-09 DIAGNOSIS — J449 Chronic obstructive pulmonary disease, unspecified: Secondary | ICD-10-CM | POA: Diagnosis not present

## 2018-06-09 DIAGNOSIS — Z95811 Presence of heart assist device: Secondary | ICD-10-CM

## 2018-06-09 NOTE — Progress Notes (Signed)
Pulmonary Individual Treatment Plan  Patient Details  Name: William Bray MRN: [id number] Date of Birth: 1950-12-02 Referring Provider:     Pulmonary Rehab from 03/24/2018 in [facility] Cardiac and Pulmonary Rehab  Referring Provider  Kelle Darting MD      Initial Encounter Date:    Pulmonary Rehab from 03/24/2018 in [facility] Cardiac and Pulmonary Rehab  Date  03/24/18      Visit Diagnosis: Chronic obstructive pulmonary disease, unspecified COPD type ([place])  Patient's Home Medications on Admission:  Current Outpatient Medications:  .  albuterol (PROVENTIL) (2.5 MG/3ML) 0.083% nebulizer solution, Inhale 3 mLs into the lungs every 6 (six) hours as needed., Disp: , Rfl:  .  allopurinol (ZYLOPRIM) 100 MG tablet, Take 100 mg by mouth daily., Disp: , Rfl:  .  ALPRAZolam (XANAX) 0.5 MG tablet, Take 0.5 mg by mouth 2 (two) times daily as needed. ONE IN THE MORNING AND ONE AT BEDTIME PRN, Disp: , Rfl:  .  aspirin EC 81 MG tablet, Take 81 mg by mouth daily., Disp: , Rfl:  .  budesonide (PULMICORT) 0.5 MG/2ML nebulizer solution, Inhale 2 mLs into the lungs 2 (two) times daily., Disp: , Rfl:  .  cetirizine (ZYRTEC) 10 MG tablet, Take 10 mg by mouth daily., Disp: , Rfl:  .  cyanocobalamin 100 MCG tablet, Take 100 mcg by mouth daily., Disp: , Rfl:  .  ferrous sulfate 324 (65 Fe) MG TBEC, Take 324 mg by mouth every morning. WITH BREAKFAST, Disp: , Rfl:  .  FLUoxetine (PROZAC) 20 MG capsule, Take 80 mg by mouth daily., Disp: , Rfl:  .  gabapentin (NEURONTIN) 300 MG capsule, Take 600 mg by mouth 3 (three) times daily. , Disp: , Rfl:  .  lisinopril (PRINIVIL,ZESTRIL) 2.5 MG tablet, Take 2.5 mg by mouth daily., Disp: , Rfl:  .  magnesium oxide (MAG-OX) 400 MG tablet, Take 400 mg by mouth daily., Disp: , Rfl:  .  metoprolol tartrate (LOPRESSOR) 25 MG tablet, Take 25 mg by mouth 2 (two) times daily., Disp: , Rfl:  .  mirtazapine (REMERON) 30 MG tablet, Take 0.5 tablets by mouth at bedtime., Disp: , Rfl:  .   montelukast (SINGULAIR) 10 MG tablet, Take 10 mg by mouth at bedtime., Disp: , Rfl:  .  Multiple Vitamins-Minerals (MULTIVITAMIN WITH MINERALS) tablet, Take 1 tablet by mouth daily., Disp: , Rfl:  .  ondansetron (ZOFRAN) 4 MG tablet, Take 4 mg by mouth every 8 (eight) hours as needed for nausea., Disp: , Rfl:  .  oxycodone (OXY-IR) 5 MG capsule, Take 5 mg by mouth every 4 (four) hours as needed., Disp: , Rfl:  .  pantoprazole (PROTONIX) 40 MG tablet, Take 40 mg by mouth daily., Disp: , Rfl:  .  pravastatin (PRAVACHOL) 40 MG tablet, Take 40 mg by mouth at bedtime., Disp: , Rfl:  .  spironolactone (ALDACTONE) 25 MG tablet, Take 25 mg by mouth daily., Disp: , Rfl:  .  torsemide (DEMADEX) 20 MG tablet, Take 2 tablets by mouth daily., Disp: , Rfl:  .  umeclidinium-vilanterol (ANORO ELLIPTA) 62.5-25 MCG/INH AEPB, Inhale 1 puff into the lungs daily., Disp: , Rfl:  .  warfarin (COUMADIN) 3 MG tablet, Take 3 mg by mouth 3 (three) times a week., Disp: , Rfl:  .  warfarin (COUMADIN) 4 MG tablet, Take 4 mg by mouth 4 (four) times a week., Disp: , Rfl:  .  warfarin (COUMADIN) 5 MG tablet, Take 5 mg by mouth as directed. Taking 5  mg M, W, F and 4 mg T, Th, Sat, Sun., Disp: , Rfl:   Past Medical History: Past Medical History:  Diagnosis Date  . Arthritis   . Chronic back pain   . Diabetes mellitus without complication ([place])   . Hypertension   . Neuropathy     Tobacco Use: Social History   Tobacco Use  Smoking Status Former Smoker  . Packs/day: 1.00  . Years: 45.00  . Pack years: 45.00  . Types: Cigarettes  . Last attempt to quit: 05/28/2005  . Years Bray quitting: 13.0  Smokeless Tobacco Never Used    Labs: Recent Review Flowsheet Data    Labs for ITP Cardiac and Pulmonary Rehab Latest Ref Rng & Units 08/08/2012 04/28/2013 11/05/2013 05/30/2014 06/02/2014   Cholestrol 0 - 200 mg/dL 105 - 127 64 -   LDLCALC 0 - 100 mg/dL 37 - 74 27 -   HDL 40 - 60 mg/dL 33(L) - 33(L) 15(L) -   Trlycerides 0 - 200  mg/dL 173 - 99 108 -   Hemoglobin A1c 4.2 - 6.3 % - 5.9 - - 5.3       Pulmonary Assessment Scores: Pulmonary Assessment Scores    Row Name 03/24/18 1530 03/26/18 1202 06/06/18 1130     ADL UCSD   ADL Phase  Entry  Entry  Exit   SOB Score total  -  51  47   Rest  -  0  0   Walk  -  1  1   Stairs  -  5  4   [place]  -  1  1   Dress  -  1  1   Shop  -  1  3     CAT Score   CAT Score  -  19  19     mMRC Score   mMRC Score  3  3  -      Pulmonary Function Assessment:   Exercise Target Goals: Exercise Program Goal: Individual exercise prescription set using results from initial 6 min walk test and THRR while considering  patient's activity barriers and safety.   Exercise Prescription Goal: Initial exercise prescription builds to 30-45 minutes a day of aerobic activity, 2-3 days per week.  Home exercise guidelines will be given to patient during program as part of exercise prescription that the participant will acknowledge.  Activity Barriers & Risk Stratification: Activity Barriers & Cardiac Risk Stratification - 03/24/18 1548      Activity Barriers & Cardiac Risk Stratification   Activity Barriers  Deconditioning;Muscular Weakness;Shortness of Breath;Decreased Ventricular Function;Balance Concerns;Back Problems       6 Minute Walk: 6 Minute Walk    Row Name 03/24/18 1544         6 Minute Walk   Phase  Initial     Distance  760 feet     Walk Time  4.6 minutes     # of Rest Breaks  3 15 sec, 9 sec, 1 min     MPH  1.88     METS  2.39     RPE  13     Perceived Dyspnea   2     VO2 Peak  8.36     Symptoms  Yes (comment)     Comments  SOB, back and leg pain 8/10     Resting HR  92 bpm     Resting BP  132/64     Resting Oxygen Saturation   95 %  Exercise Oxygen Saturation  during 6 min walk  86 %     Max Ex. HR  132 bpm     Max Ex. BP  136/64     2 Minute Post BP  124/62       Interval HR   1 Minute HR  83     2 Minute HR  81     3 Minute HR  94     4  Minute HR  98     5 Minute HR  117     6 Minute HR  132     2 Minute Post HR  108     Interval Heart Rate?  Yes       Interval Oxygen   Interval Oxygen?  Yes     Baseline Oxygen Saturation %  95 %     1 Minute Oxygen Saturation %  92 %     1 Minute Liters of Oxygen  3 L     2 Minute Oxygen Saturation %  88 % rest break 2:20-2:35     2 Minute Liters of Oxygen  3 L     3 Minute Oxygen Saturation %  89 % rest break 3:14-3:23     3 Minute Liters of Oxygen  3 L     4 Minute Oxygen Saturation %  90 % rest break 4:13-5:13     4 Minute Liters of Oxygen  3 L     5 Minute Oxygen Saturation %  86 %     5 Minute Liters of Oxygen  3 L     6 Minute Oxygen Saturation %  88 %     6 Minute Liters of Oxygen  3 L     2 Minute Post Oxygen Saturation %  90 %     2 Minute Post Liters of Oxygen  3 L       Oxygen Initial Assessment: Oxygen Initial Assessment - 03/24/18 1445      Home Oxygen   Home Oxygen Device  Home Concentrator;Portable Concentrator;E-Tanks    Sleep Oxygen Prescription  Continuous   Now using Trilogy machine versus CPAP   Liters per minute  2    Home Exercise Oxygen Prescription  Continuous    Liters per minute  3    Home at Rest Exercise Oxygen Prescription  Continuous    Liters per minute  2    Compliance with Home Oxygen Use  Yes      Initial 6 min Walk   Oxygen Used  Portable Concentrator;Pulsed      Program Oxygen Prescription   Program Oxygen Prescription  Continuous;E-Tanks    Liters per minute  3      Intervention   Short Term Goals  To learn and exhibit compliance with exercise, home and travel O2 prescription;To learn and understand importance of maintaining oxygen saturations>88%;To learn and demonstrate proper use of respiratory medications;To learn and demonstrate proper pursed lip breathing techniques or other breathing techniques.;To learn and understand importance of monitoring SPO2 with pulse oximeter and demonstrate accurate use of the pulse oximeter.     Long  Term Goals  Exhibits compliance with exercise, home and travel O2 prescription;Verbalizes importance of monitoring SPO2 with pulse oximeter and return demonstration;Maintenance of O2 saturations>88%;Exhibits proper breathing techniques, such as pursed lip breathing or other method taught during program session;Compliance with respiratory medication;Demonstrates proper use of MDI's       Oxygen Re-Evaluation: Oxygen Re-Evaluation    Row  Name 03/26/18 1059 04/21/18 1440 05/30/18 1034         Program Oxygen Prescription   Program Oxygen Prescription  -  Continuous;E-Tanks  Continuous;E-Tanks     Liters per minute  -  3  3       Home Oxygen   Home Oxygen Device  -  Home Concentrator;Portable Concentrator;E-Tanks  Home Concentrator;Portable Concentrator;E-Tanks     Sleep Oxygen Prescription  -  Continuous  Continuous     Liters per minute  -  2  2     Home Exercise Oxygen Prescription  -  Continuous  Continuous     Liters per minute  -  3  3     Home at Rest Exercise Oxygen Prescription  -  Continuous  Continuous     Liters per minute  -  2  2     Compliance with Home Oxygen Use  -  Yes  Yes       Goals/Expected Outcomes   Short Term Goals  To learn and understand importance of maintaining oxygen saturations>88%;To learn and demonstrate proper pursed lip breathing techniques or other breathing techniques.;To learn and understand importance of monitoring SPO2 with pulse oximeter and demonstrate accurate use of the pulse oximeter.;To learn and exhibit compliance with exercise, home and travel O2 prescription  To learn and understand importance of maintaining oxygen saturations>88%;To learn and demonstrate proper pursed lip breathing techniques or other breathing techniques.;To learn and understand importance of monitoring SPO2 with pulse oximeter and demonstrate accurate use of the pulse oximeter.;To learn and exhibit compliance with exercise, home and travel O2 prescription  To learn and  understand importance of maintaining oxygen saturations>88%;To learn and demonstrate proper pursed lip breathing techniques or other breathing techniques.;To learn and understand importance of monitoring SPO2 with pulse oximeter and demonstrate accurate use of the pulse oximeter.;To learn and exhibit compliance with exercise, home and travel O2 prescription     Long  Term Goals  Exhibits compliance with exercise, home and travel O2 prescription;Verbalizes importance of monitoring SPO2 with pulse oximeter and return demonstration;Maintenance of O2 saturations>88%;Exhibits proper breathing techniques, such as pursed lip breathing or other method taught during program session  Exhibits compliance with exercise, home and travel O2 prescription;Verbalizes importance of monitoring SPO2 with pulse oximeter and return demonstration;Maintenance of O2 saturations>88%;Exhibits proper breathing techniques, such as pursed lip breathing or other method taught during program session  Exhibits compliance with exercise, home and travel O2 prescription;Verbalizes importance of monitoring SPO2 with pulse oximeter and return demonstration;Maintenance of O2 saturations>88%;Exhibits proper breathing techniques, such as pursed lip breathing or other method taught during program session     Comments  Reviewed PLB technique with pt.  Talked about how it work and it's important to maintaining his exercise saturations.    Patient has been checking his oxygen at home regularly. He is practicing his PLB techniques also. He uses his incentive spirometer everyday to help strengthen his breathing. His oxygen has been good when exercising and has been trying to decrease his oxygen. He was out for being sick and had a procedure done so he has been wearing 3 liters, Patient will try to decrease oxygen to 2 liters when exercising.  William Bray has been able to use 2 liters at times when exercising. He is using PLB at home and uses 2 liters at rest. He  still wants to get down to using 2 liters of oxygen with exertion.     Goals/Expected Outcomes  Short: Become more  profiecient at using PLB.   Long: Become independent at using PLB.  Short: decrease oxygen to 2 liters when exercising. Long: decrease oxygen to 1 liter while exercising.  Short: continue to monitor oxygen at home and decrease to 2L while exercising. Long: maintain 2 liters of oxygen on exertion.        Oxygen Discharge (Final Oxygen Re-Evaluation): Oxygen Re-Evaluation - 05/30/18 1034      Program Oxygen Prescription   Program Oxygen Prescription  Continuous;E-Tanks    Liters per minute  3      Home Oxygen   Home Oxygen Device  Home Concentrator;Portable Concentrator;E-Tanks    Sleep Oxygen Prescription  Continuous    Liters per minute  2    Home Exercise Oxygen Prescription  Continuous    Liters per minute  3    Home at Rest Exercise Oxygen Prescription  Continuous    Liters per minute  2    Compliance with Home Oxygen Use  Yes      Goals/Expected Outcomes   Short Term Goals  To learn and understand importance of maintaining oxygen saturations>88%;To learn and demonstrate proper pursed lip breathing techniques or other breathing techniques.;To learn and understand importance of monitoring SPO2 with pulse oximeter and demonstrate accurate use of the pulse oximeter.;To learn and exhibit compliance with exercise, home and travel O2 prescription    Long  Term Goals  Exhibits compliance with exercise, home and travel O2 prescription;Verbalizes importance of monitoring SPO2 with pulse oximeter and return demonstration;Maintenance of O2 saturations>88%;Exhibits proper breathing techniques, such as pursed lip breathing or other method taught during program session    Comments  William Bray has been able to use 2 liters at times when exercising. He is using PLB at home and uses 2 liters at rest. He still wants to get down to using 2 liters of oxygen with exertion.    Goals/Expected  Outcomes  Short: continue to monitor oxygen at home and decrease to 2L while exercising. Long: maintain 2 liters of oxygen on exertion.       Initial Exercise Prescription: Initial Exercise Prescription - 03/24/18 1500      Date of Initial Exercise RX and Referring Provider   Date  03/24/18    Referring Provider  Kelle Darting MD      Oxygen   Oxygen  Continuous    Liters  3      Treadmill   MPH  1.8    Grade  0    Minutes  15    METs  2.38      REL-XR   Level  1    Speed  50    Minutes  15    METs  2      T5 Nustep   Level  1    SPM  80    Minutes  15    METs  2      Prescription Details   Frequency (times per week)  3    Duration  Progress to 45 minutes of aerobic exercise without signs/symptoms of physical distress      Intensity   THRR 40-80% of Max Heartrate  116-141    Ratings of Perceived Exertion  11-13    Perceived Dyspnea  0-4      Progression   Progression  Continue to progress workloads to maintain intensity without signs/symptoms of physical distress.      Resistance Training   Training Prescription  Yes    Weight  3 lbs  Reps  10-15       Perform Capillary Blood Glucose checks as needed.  Exercise Prescription Changes: Exercise Prescription Changes    Row Name 03/24/18 1500 04/02/18 1200 04/16/18 1100 04/30/18 1200 05/14/18 1200     Response to Exercise   Blood Pressure (Admit)  132/64  110/70  96/58  110/70  108/60   Blood Pressure (Exercise)  136/64  118/70  -  -  -   Blood Pressure (Exit)  124/62  102/70  120/66  106/58  122/70   Heart Rate (Admit)  92 bpm  72 bpm  78 bpm  92 bpm  51 bpm   Heart Rate (Exercise)  132 bpm  95 bpm  86 bpm  145 bpm probably not correct  112 bpm   Heart Rate (Exit)  108 bpm  72 bpm  87 bpm  87 bpm  102 bpm   Oxygen Saturation (Admit)  95 %  92 %  95 %  95 %  92 %   Oxygen Saturation (Exercise)  86 %  92 %  87 %  93 %  93 %   Oxygen Saturation (Exit)  90 %  90 %  90 %  95 %  96 %   Rating of Perceived  Exertion (Exercise)  _0 Perceived Dyspnea (Exercise)  _1 Symptoms  back and leg pain 8/10, SOB  -  -  -  none   Comments  walk test results  -  -  -  -   Duration  -  Progress to 45 minutes of aerobic exercise without signs/symptoms of physical distress  Progress to 45 minutes of aerobic exercise without signs/symptoms of physical distress  Progress to 45 minutes of aerobic exercise without signs/symptoms of physical distress  Progress to 30 minutes of  aerobic without signs/symptoms of physical distress   Intensity  -  THRR unchanged  THRR unchanged  THRR unchanged  THRR unchanged     Progression   Progression  -  Continue to progress workloads to maintain intensity without signs/symptoms of physical distress.  Continue to progress workloads to maintain intensity without signs/symptoms of physical distress.  Continue to progress workloads to maintain intensity without signs/symptoms of physical distress.  Continue to progress workloads to maintain intensity without signs/symptoms of physical distress.   Average METs  -  2.4  3.2  2.4  2.69     Resistance Training   Training Prescription  -  Yes  Yes  Yes  Yes   Weight  -  3 lb  3 lb  3 lb  3   Reps  -  10-15  10-15  10-15  10-15     Interval Training   Interval Training  -  No  No  No  No     Oxygen   Oxygen  -  Continuous  Continuous  Continuous  Continuous   Liters  -  _2 Treadmill   MPH  -  1.8  1.8  1.8  1.8   Grade  -  0  0  0  0   Minutes  -  _3 METs  -  2.38  2.38  2.38  2.38     NuStep   Level  -  -  1  -  -  SPM  -  -  80  -  -   Minutes  -  -  15  -  -   METs  -  -  1.8  -  -     REL-XR   Level  -  _0 Speed  -  50  50  50  50   Minutes  -  _1 METs  -  3.1  5.4  2.4  2.9     T5 Nustep   Level  -  1  -  -  1   SPM  -  80  -  -  80   Minutes  -  15  -  -  15   METs  -  1.7  -  -  2.8   Row Name 05/27/18 1200              Response to Exercise   Blood Pressure (Admit)  100/80       Blood Pressure (Exit)  88/60       Heart Rate (Admit)  90 bpm       Heart Rate (Exercise)  98 bpm       Heart Rate (Exit)  88 bpm       Oxygen Saturation (Admit)  93 %       Oxygen Saturation (Exercise)  92 %       Oxygen Saturation (Exit)  98 %       Rating of Perceived Exertion (Exercise)  14       Perceived Dyspnea (Exercise)  1       Symptoms  none       Duration  Progress to 45 minutes of aerobic exercise without signs/symptoms of physical distress       Intensity  THRR unchanged         Progression   Progression  Continue to progress workloads to maintain intensity without signs/symptoms of physical distress.       Average METs  2.3         Resistance Training   Training Prescription  Yes       Weight  3 lb       Reps  10-15         Interval Training   Interval Training  No         Oxygen   Oxygen  Continuous       Liters  3         Treadmill   MPH  1.8       Grade  0       Minutes  15       METs  2.38         NuStep   Level  1       SPM  80       Minutes  15       METs  2         REL-XR   Level  2       Speed  50       Minutes  15       METs  2.6          Exercise Comments: Exercise Comments    Row Name 03/26/18 1057 05/26/18 1316         Exercise Comments  First full day of exercise!  Patient was  oriented to gym and equipment including functions, settings, policies, and procedures.  Patient's individual exercise prescription and treatment plan were reviewed.  All starting workloads were established based on the results of the 6 minute walk test done at initial orientation visit.  The plan for exercise progression was also introduced and progression will be customized based on patient's performance and goals.  Patient wears an over the shoulder bag to exercise. Discussed different LVAD accessories that would protect his drive line and not pull on his neck especially during exercise. Recommended  a conceal carry shirt or the holster vest. Requested that patient at least try the other options for safety.          Exercise Goals and Review: Exercise Goals    Row Name 03/24/18 1552             Exercise Goals   Increase Physical Activity  Yes       Intervention  Provide advice, education, support and counseling about physical activity/exercise needs.;Develop an individualized exercise prescription for aerobic and resistive training based on initial evaluation findings, risk stratification, comorbidities and participant's personal goals.       Expected Outcomes  Short Term: Attend rehab on a regular basis to increase amount of physical activity.;Long Term: Add in home exercise to make exercise part of routine and to increase amount of physical activity.;Long Term: Exercising regularly at least 3-5 days a week.       Increase Strength and Stamina  Yes       Intervention  Provide advice, education, support and counseling about physical activity/exercise needs.;Develop an individualized exercise prescription for aerobic and resistive training based on initial evaluation findings, risk stratification, comorbidities and participant's personal goals.       Expected Outcomes  Short Term: Increase workloads from initial exercise prescription for resistance, speed, and METs.;Short Term: Perform resistance training exercises routinely during rehab and add in resistance training at home;Long Term: Improve cardiorespiratory fitness, muscular endurance and strength as measured by increased METs and functional capacity (6MWT)       Able to understand and use rate of perceived exertion (RPE) scale  Yes       Intervention  Provide education and explanation on how to use RPE scale       Expected Outcomes  Long Term:  Able to use RPE to guide intensity level when exercising independently;Short Term: Able to use RPE daily in rehab to express subjective intensity level       Able to understand and use Dyspnea  scale  Yes       Intervention  Provide education and explanation on how to use Dyspnea scale       Expected Outcomes  Long Term: Able to use Dyspnea scale to guide intensity level when exercising independently;Short Term: Able to use Dyspnea scale daily in rehab to express subjective sense of shortness of breath during exertion       Knowledge and understanding of Target Heart Rate Range (THRR)  Yes       Intervention  Provide education and explanation of THRR including how the numbers were predicted and where they are located for reference       Expected Outcomes  Short Term: Able to state/look up THRR;Short Term: Able to use daily as guideline for intensity in rehab;Long Term: Able to use THRR to govern intensity when exercising independently       Able to check pulse independently  Yes       Intervention  Provide education and demonstration on how to check pulse in carotid and radial arteries.;Review the importance of being able to check your own pulse for safety during independent exercise       Expected Outcomes  Short Term: Able to explain why pulse checking is important during independent exercise;Long Term: Able to check pulse independently and accurately       Understanding of Exercise Prescription  Yes       Intervention  Provide education, explanation, and written materials on patient's individual exercise prescription       Expected Outcomes  Short Term: Able to explain program exercise prescription;Long Term: Able to explain home exercise prescription to exercise independently          Exercise Goals Re-Evaluation : Exercise Goals Re-Evaluation    William Bray Name 03/26/18 1058 04/16/18 1201 04/30/18 1229 05/14/18 1253 05/27/18 1241     Exercise Goal Re-Evaluation   Exercise Goals Review  Increase Physical Activity;Able to understand and use rate of perceived exertion (RPE) scale;Knowledge and understanding of Target Heart Rate Range (THRR);Understanding of Exercise Prescription;Increase  Strength and Stamina;Able to understand and use Dyspnea scale  Increase Physical Activity;Increase Strength and Stamina;Able to understand and use rate of perceived exertion (RPE) scale;Able to understand and use Dyspnea scale;Knowledge and understanding of Target Heart Rate Range (THRR);Understanding of Exercise Prescription  Increase Physical Activity;Increase Strength and Stamina;Able to understand and use rate of perceived exertion (RPE) scale;Able to understand and use Dyspnea scale;Knowledge and understanding of Target Heart Rate Range (THRR);Understanding of Exercise Prescription  Increase Physical Activity;Able to understand and use rate of perceived exertion (RPE) scale;Knowledge and understanding of Target Heart Rate Range (THRR);Understanding of Exercise Prescription;Increase Strength and Stamina;Able to understand and use Dyspnea scale;Able to check pulse independently  Increase Physical Activity;Increase Strength and Stamina;Able to understand and use rate of perceived exertion (RPE) scale;Knowledge and understanding of Target Heart Rate Range (THRR);Understanding of Exercise Prescription   Comments  Reviewed RPE scale, THR and program prescription with pt today.  Pt voiced understanding and was given a copy of goals to take home.   William Bray having a procedure for his back tomorrow that he hopes will help with back pain.  He states he wants to work as much as he can and back pain inhibits his improvement.  William Bray stated he can feel the difference this week with exercise.  He had injections for back pain that are helping him.   William Bray continues to feel better and more able to exercise due to his back injections. Continue to monitor pain and progress as his pain will allow.  William Bray has been feeling better Bray getting injections for back pain.  This should help him attend more regularly and see better progress.   Expected Outcomes  Short: Use RPE daily to regulate intensity. Long: Follow program prescription  in THR.  Short - get clearance afte rprocedure to return to exercise Long - maintain or improve MET level  Short - attend consistently Long - increase endurance and MET level  Short- attend class regularly, increase levels as pain allows. Long- increase MET level and graduate from program.  Short - attend LW 3 times per week Long - maintain or increase MET level      Discharge Exercise Prescription (Final Exercise Prescription Changes): Exercise Prescription Changes - 05/27/18 1200      Response to Exercise   Blood Pressure (Admit)  100/80    Blood Pressure (Exit)  88/60    Heart Rate (Admit)  90  bpm    Heart Rate (Exercise)  98 bpm    Heart Rate (Exit)  88 bpm    Oxygen Saturation (Admit)  93 %    Oxygen Saturation (Exercise)  92 %    Oxygen Saturation (Exit)  98 %    Rating of Perceived Exertion (Exercise)  14    Perceived Dyspnea (Exercise)  1    Symptoms  none    Duration  Progress to 45 minutes of aerobic exercise without signs/symptoms of physical distress    Intensity  THRR unchanged      Progression   Progression  Continue to progress workloads to maintain intensity without signs/symptoms of physical distress.    Average METs  2.3      Resistance Training   Training Prescription  Yes    Weight  3 lb    Reps  10-15      Interval Training   Interval Training  No      Oxygen   Oxygen  Continuous    Liters  3      Treadmill   MPH  1.8    Grade  0    Minutes  15    METs  2.38      NuStep   Level  1    SPM  80    Minutes  15    METs  2      REL-XR   Level  2    Speed  50    Minutes  15    METs  2.6       Nutrition:  Target Goals: Understanding of nutrition guidelines, daily intake of sodium <1548m, cholesterol <2025m calories 30% from fat and 7% or less from saturated fats, daily to have 5 or more servings of fruits and vegetables.  Biometrics: Pre Biometrics - 03/24/18 1553      Pre Biometrics   Height  5' 9" (1.753 m)    Weight  200 lb 3.2 oz  (90.8 kg)    Waist Circumference  42.5 inches    Hip Circumference  42 inches    Waist to Hip Ratio  1.01 %    BMI (Calculated)  29.55    Single Leg Stand  0.73 seconds        Nutrition Therapy Plan and Nutrition Goals: Nutrition Therapy & Goals - 04/14/18 1128      Nutrition Therapy   Diet  DM    Drug/Food Interactions  Coumadin/Vit K    Protein (specify units)  12oz    Fiber  30 grams    Whole Grain Foods  3 servings   main source of whole grains in diet is oatmeal   Saturated Fats  15 max. grams    Fruits and Vegetables  6 servings/day   8 ideal; eats more vegetables than fruits   Sodium  1500 grams      Personal Nutrition Goals   Nutrition Goal  Work towards choosing snacks that are more nutient-dense and lower in added sugars    Personal Goal #2  Because you do not eat much meat, continue consistent daily intake of Boost nutritional drinks and try to include other dietary sources of protein such as beans, lentils, eggs, dairy foods.    Personal Goal #3  Continue to choose lower sugar/ sugar free options for items like syrup and beverages and choose foods with natural sugar instead like fruit. Great job!    Comments  He feels that his DM type II  is controlled, and tries to "eat healthy." He and his wife limit fried foods, salt and sugar. Breakfast: eggs, grits, oatmeal, pancakes 1x/wk with SF syrup. Lunch: 1/2 sandwich, soup, nabs. Dinner: eats at home mostly; vegetables (Frozen not canned), beans, some meats (chicken/ roast). Beverages: diet coke, water, unsweetened tea, OJ at breakfast. Occasionally has snacks like honey buns or oatmeal cream pies _0 . Takes Mg, K+, Vitamin B12, and MVI daily. Takes Remeron prn and Coumadin as prescribed. He is familiar with foods high in Vitamin K. He would like to regain strength/muscle mass and lose 15#.       Intervention Plan   Intervention  Prescribe, educate and counsel regarding individualized specific dietary modifications aiming  towards targeted core components such as weight, hypertension, lipid management, diabetes, heart failure and other comorbidities.    Expected Outcomes  Short Term Goal: Understand basic principles of dietary content, such as calories, fat, sodium, cholesterol and nutrients.;Long Term Goal: Adherence to prescribed nutrition plan.;Short Term Goal: A plan has been developed with personal nutrition goals set during dietitian appointment.       Nutrition Assessments: Nutrition Assessments - 03/26/18 1205      MEDFICTS Scores   Pre Score  60       Nutrition Goals Re-Evaluation: Nutrition Goals Re-Evaluation    [place] Name 04/14/18 1142 05/26/18 1052           Goals   Nutrition Goal  Work towards choosing snacks that are more nutrient-dense and lower in added sugars  Work towards choosing snacks that are more nutrient dense and lower in added sugar; Because you do not eat much meat continue consistent daily intake of Boost drinks and try to include other sources of dietary protein such as beans, lentils, eggs and dairy foods; Continue to choose lower sugar/ SF options for items like syrup and beverages and choose foods with natural sugar instead      Comment  He is mindful about added sugar intake in other areas of his diet but in the evenings his snacks tend to be more processed and less nutritious  He has continued to drink Boost drinks at least daily and has also been eating eggs and beans frequently. He is being mindful about high-sugar snacks, but has also found this to be challenging during the Holiday season      Expected Outcome  He will work to find more nutritious /lower sugar options for HS snacks, or reduce the frequency that he has the full-sugar varieties  He will continue to drink Boost drinks at least daily on a consistent basis. Long term goal: eat lower sugar snacks post- Holiday and continue to choose low/zero sugar beverages         Personal Goal #2 Re-Evaluation   Personal Goal  #2  Because you do not eat much meat, continue consistent daily intake of Boost nutritional drinks and try to include other dietary sources of protein such as beans, lentils, eggs, dairy foods.  -        Personal Goal #3 Re-Evaluation   Personal Goal #3  Continue to choose lower sugar/ sugar free options for items like syrup and beverages and choose foods with natural sugar instead like fruit. Great job!   -         Nutrition Goals Discharge (Final Nutrition Goals Re-Evaluation): Nutrition Goals Re-Evaluation - 05/26/18 1052      Goals   Nutrition Goal  Work towards choosing snacks that are more nutrient dense and lower  in added sugar; Because you do not eat much meat continue consistent daily intake of Boost drinks and try to include other sources of dietary protein such as beans, lentils, eggs and dairy foods; Continue to choose lower sugar/ SF options for items like syrup and beverages and choose foods with natural sugar instead    Comment  He has continued to drink Boost drinks at least daily and has also been eating eggs and beans frequently. He is being mindful about high-sugar snacks, but has also found this to be challenging during the Holiday season    Expected Outcome  He will continue to drink Boost drinks at least daily on a consistent basis. Long term goal: eat lower sugar snacks post- Holiday and continue to choose low/zero sugar beverages        Psychosocial: Target Goals: Acknowledge presence or absence of significant depression and/or stress, maximize coping skills, provide positive support system. Participant is able to verbalize types and ability to use techniques and skills needed for reducing stress and depression.   Initial Review & Psychosocial Screening: Initial Psych Review & Screening - 03/24/18 1446      Initial Review   Current issues with  History of Depression;Current Psychotropic Meds;Current Stress Concerns;Current Sleep Concerns    Source of Stress Concerns   Chronic Illness;Unable to perform yard/household activities    Comments  Changed pysch meds Bray last seen, waking up frequently at night, newly diagnosis lung cancer, breathing continues to be a struggle      [place]?  Yes    Comments  Mr Okray has great support from his wife, daughter, and son.       Barriers   Psychosocial barriers to participate in program  Psychosocial barriers identified (see note);The patient should benefit from training in stress management and relaxation.      Screening Interventions   Interventions  Encouraged to exercise;Provide feedback about the scores to participant;Program counselor consult;To provide support and resources with identified psychosocial needs    Expected Outcomes  Short Term goal: Utilizing psychosocial counselor, staff and physician to assist with identification of specific Stressors or current issues interfering with healing process. Setting desired goal for each stressor or current issue identified.;Long Term Goal: Stressors or current issues are controlled or eliminated.;Short Term goal: Identification and review with participant of any Quality of Life or Depression concerns found by scoring the questionnaire.;Long Term goal: The participant improves quality of Life and PHQ9 Scores as seen by post scores and/or verbalization of changes       Quality of Life Scores:  Scores of 19 and below usually indicate a poorer quality of life in these areas.  A difference of  2-3 points is a clinically meaningful difference.  A difference of 2-3 points in the total score of the Quality of Life Index has been associated with significant improvement in overall quality of life, self-image, physical symptoms, and general health in studies assessing change in quality of life.  PHQ-9: Recent Review Flowsheet Data    Depression screen [facility] 2/9 06/06/2018 05/26/2018 03/26/2018 03/24/2018 12/11/2017   Decreased Interest 0 _0 0    Down, Depressed, Hopeless _1 PHQ - 2 Score _2 Altered sleeping _3 0   Tired, decreased energy _4 Change in appetite _5 Feeling bad or failure  about yourself  1 1 0 0 0   Trouble concentrating 0 0 0 0 0   Moving slowly or fidgety/restless 0 1 1 0 0   Suicidal thoughts 0 0 0 0 0   PHQ-9 Score _0 Difficult doing work/chores Somewhat difficult Somewhat difficult Somewhat difficult Very difficult Not difficult at all     Interpretation of Total Score  Total Score Depression Severity:  1-4 = Minimal depression, 5-9 = Mild depression, 10-14 = Moderate depression, 15-19 = Moderately severe depression, 20-27 = Severe depression   Psychosocial Evaluation and Intervention: Psychosocial Evaluation - 04/14/18 1127      Psychosocial Evaluation & Interventions   Interventions  Stress management education;Encouraged to exercise with the program and follow exercise prescription;Relaxation education    Comments  Counselor met with William Bray upon returning to this program after being out with several bouts of Pneumonia recently.  He is a 68 year old who has a very strong support system with a spouse and spouse's family as well as his own family who live locally.  He reports having severe back problems; diabetes;  and left lung cancer in addition to the COPD.  He reports sleeping "okay" with ~4-5 hours most nights.  He has a good appetite and reports having minimal stress in his life other than his health.  William Bray reports a history of depression and anxiety and is on medication that he reports is helpful for treating these symptoms.  His goals are to increase his stamina and strength and his core muscles while possibly losing some weight in this program.  Staff will follow with William Bray.      Expected Outcomes  Short:  William Bray will exercise for his health and mental health to reduce his anxiety and learn ways to relax better.   Long:  William Bray will exercise consistently to  increase his strength and stamina and improve his core muscles to help reduce his back pain.      Continue Psychosocial Services   Follow up required by staff       Psychosocial Re-Evaluation: Psychosocial Re-Evaluation    [place] Name 04/21/18 1445 05/26/18 1046 06/02/18 1049 06/02/18 1052       Psychosocial Re-Evaluation   Current issues with  Current Stress Concerns;Current Depression;History of Depression  Current Stress Concerns;Current Depression;History of Depression  Current Stress Concerns  Current Stress Concerns    Comments  William Bray states that his mood has improved Bray the start of the program. He sometimes throws himself a pity party but soon stops when he realizes it does not do him any good. He likes coming to exercise to get the benifit and it takes his mind off of his health. He tries really hard in the program and wants to get better,  Reviewed patient health questionnaire (PHQ-9) with patient for follow up. Previously, patients score indicated signs/symptoms of depression.  Reviewed to see if patient is improving symptom wise while in program.  Score declined and patient states that it is because he has had pneumonia and has been lacking energy.  William Bray states he is deciding about what treatment to have for lung cancer.  He has good support from family whatever his decision and he says it doent call him stress.  Emeterio states he is deciding about what treatment to have for lung cancer.  He has good support from family whatever his decision and he says it doesnt cause him stress.    Expected Outcomes  Short:  Attend [name] stress management education to decrease stress. Long: Maintain exercise Post [name] to keep stress at a minimum.  Short: Continue to work toward an improvement in [place] scores by attending [name] regularly. Long: Continue to improve stress and depression coping skills by talking with staff and attending [name] regularly and work toward a positive mental state.    Short - continue LW for the exercise Long - maintain inderpendence with ADLs  Short - continue LW for the exercise Long - maintain inderpendence with ADLs    Interventions  Encouraged to attend Pulmonary Rehabilitation for the exercise;Relaxation education  Encouraged to attend Pulmonary Rehabilitation for the exercise;Relaxation education  Encouraged to attend Pulmonary Rehabilitation for the exercise  Encouraged to attend Pulmonary Rehabilitation for the exercise    Continue Psychosocial Services   Follow up required by staff  Follow up required by staff  Follow up required by staff  Follow up required by staff    Comments  -  -  -  Changed pysch meds Bray last seen, waking up frequently at night, newly diagnosis lung cancer, breathing continues to be a struggle      Initial Review   Source of Stress Concerns  -  -  -  Chronic Illness;Unable to perform yard/household activities       Psychosocial Discharge (Final Psychosocial Re-Evaluation): Psychosocial Re-Evaluation - 06/02/18 1052      Psychosocial Re-Evaluation   Current issues with  Current Stress Concerns    Comments  William Bray states he is deciding about what treatment to have for lung cancer.  He has good support from family whatever his decision and he says it doesnt cause him stress.    Expected Outcomes  Short - continue LW for the exercise Long - maintain inderpendence with ADLs    Interventions  Encouraged to attend Pulmonary Rehabilitation for the exercise    Continue Psychosocial Services   Follow up required by staff    Comments  Changed pysch meds Bray last seen, waking up frequently at night, newly diagnosis lung cancer, breathing continues to be a struggle      Initial Review   Source of Stress Concerns  Chronic Illness;Unable to perform yard/household activities       Education: Education Goals: Education classes will be provided on a weekly basis, covering required topics. Participant will state understanding/return  demonstration of topics presented.  Learning Barriers/Preferences: Learning Barriers/Preferences - 03/24/18 1448      Learning Barriers/Preferences   Learning Barriers  Sight;Hearing   glasses and hearing aids   Learning Preferences  None       Education Topics:  Initial Evaluation Education: - Verbal, written and demonstration of respiratory meds, oximetry and breathing techniques. Instruction on use of nebulizers and MDIs and importance of monitoring MDI activations.   Pulmonary Rehab from 06/06/2018 in [facility] Cardiac and Pulmonary Rehab  Date  03/24/18  Educator  [facility]  Instruction Review Code  1- Verbalizes Understanding      General Nutrition Guidelines/Fats and Fiber: -Group instruction provided by verbal, written material, models and posters to present the general guidelines for heart healthy nutrition. Gives an explanation and review of dietary fats and fiber.   Pulmonary Rehab from 10/23/2017 in [facility] Cardiac and Pulmonary Rehab  Date  10/14/17  Educator  CR  Instruction Review Code  1- Verbalizes Understanding      Controlling Sodium/Reading Food Labels: -Group verbal and written material supporting the discussion of sodium use in heart healthy nutrition. Review and explanation  with models, verbal and written materials for utilization of the food label.   Pulmonary Rehab from 06/06/2018 in [facility] Cardiac and Pulmonary Rehab  Date  04/16/18  Educator  LB  Instruction Review Code  1- Verbalizes Understanding      Exercise Physiology & General Exercise Guidelines: - Group verbal and written instruction with models to review the exercise physiology of the cardiovascular system and associated critical values. Provides general exercise guidelines with specific guidelines to those with heart or lung disease.    Pulmonary Rehab from 06/06/2018 in [facility] Cardiac and Pulmonary Rehab  Date  05/02/18  Educator  [facility]  Instruction Review Code  5- Refused Teaching      Aerobic Exercise &  Resistance Training: - Gives group verbal and written instruction on the various components of exercise. Focuses on aerobic and resistive training programs and the benefits of this training and how to safely progress through these programs.   Pulmonary Rehab from 06/06/2018 in [facility] Cardiac and Pulmonary Rehab  Date  05/23/18  Educator  [facility]  Instruction Review Code  1- Verbalizes Understanding      Flexibility, Balance, Mind/Body Relaxation: Provides group verbal/written instruction on the benefits of flexibility and balance training, including mind/body exercise modes such as yoga, pilates and tai chi.  Demonstration and skill practice provided.   Cardiac Rehab from 03/21/2016 in [facility] Cardiac and Pulmonary Rehab  Date  01/09/16  Educator  [facility]  Instruction Review Code (retired)  2- meets goals/outcomes      Stress and Anxiety: - Provides group verbal and written instruction about the health risks of elevated stress and causes of high stress.  Discuss the correlation between heart/lung disease and anxiety and treatment options. Review healthy ways to manage with stress and anxiety.   Pulmonary Rehab from 10/23/2017 in [facility] Cardiac and Pulmonary Rehab  Date  10/23/17  Educator  [facility]  Instruction Review Code  1- Verbalizes Understanding      Depression: - Provides group verbal and written instruction on the correlation between heart/lung disease and depressed mood, treatment options, and the stigmas associated with seeking treatment.   Exercise & Equipment Safety: - Individual verbal instruction and demonstration of equipment use and safety with use of the equipment.   Pulmonary Rehab from 06/06/2018 in [facility] Cardiac and Pulmonary Rehab  Date  03/24/18  Educator  [facility]  Instruction Review Code  1- Verbalizes Understanding      Infection Prevention: - Provides verbal and written material to individual with discussion of infection control including proper hand washing and proper equipment  cleaning during exercise session.   Pulmonary Rehab from 06/06/2018 in [facility] Cardiac and Pulmonary Rehab  Date  03/24/18  Educator  [facility]  Instruction Review Code  1- Verbalizes Understanding      Falls Prevention: - Provides verbal and written material to individual with discussion of falls prevention and safety.   Pulmonary Rehab from 06/06/2018 in [facility] Cardiac and Pulmonary Rehab  Date  03/24/18  Educator  [facility]  Instruction Review Code  1- Verbalizes Understanding      Diabetes: - Individual verbal and written instruction to review signs/symptoms of diabetes, desired ranges of glucose level fasting, after meals and with exercise. Advice that pre and post exercise glucose checks will be done for 3 sessions at entry of program.   Cardiac Rehab from 03/21/2016 in [facility] Cardiac and Pulmonary Rehab  Date  01/02/16  Educator  D. Joya Gaskins, RN  Instruction Review Code (retired)  2- meets goals/outcomes  Chronic Lung Diseases: - Group verbal and written instruction to review updates, respiratory medications, advancements in procedures and treatments. Discuss use of supplemental oxygen including available portable oxygen systems, continuous and intermittent flow rates, concentrators, personal use and safety guidelines. Review proper use of inhaler and spacers. Provide informative websites for self-education.    Pulmonary Rehab from 06/06/2018 in [facility] Cardiac and Pulmonary Rehab  Date  04/30/18  Educator  [facility]  Instruction Review Code  1- Verbalizes Understanding      Energy Conservation: - Provide group verbal and written instruction for methods to conserve energy, plan and organize activities. Instruct on pacing techniques, use of adaptive equipment and posture/positioning to relieve shortness of breath.   Pulmonary Rehab from 10/23/2017 in [facility] Cardiac and Pulmonary Rehab  Date  10/16/17  Educator  [facility]  Instruction Review Code  1- Verbalizes Understanding      Triggers and  Exacerbations: - Group verbal and written instruction to review types of environmental triggers and ways to prevent exacerbations. Discuss weather changes, air quality and the benefits of nasal washing. Review warning signs and symptoms to help prevent infections. Discuss techniques for effective airway clearance, coughing, and vibrations.   AED/CPR: - Group verbal and written instruction with the use of models to demonstrate the basic use of the AED with the basic ABC's of resuscitation.   Pulmonary Rehab from 10/23/2017 in [facility] Cardiac and Pulmonary Rehab  Date  10/18/17  Educator  [place]  Instruction Review Code  1- Verbalizes Understanding      Anatomy and Physiology of the Lungs: - Group verbal and written instruction with the use of models to provide basic lung anatomy and physiology related to function, structure and complications of lung disease.   Anatomy & Physiology of the Heart: - Group verbal and written instruction and models provide basic cardiac anatomy and physiology, with the coronary electrical and arterial systems. Review of Valvular disease and Heart Failure   Pulmonary Rehab from 06/06/2018 in [facility] Cardiac and Pulmonary Rehab  Date  05/30/18  Educator  [place] [facility]  Instruction Review Code  1- Verbalizes Understanding      Cardiac Medications: - Group verbal and written instruction to review commonly prescribed medications for heart disease. Reviews the medication, class of the drug, and side effects.   Pulmonary Rehab from 06/06/2018 in [facility] Cardiac and Pulmonary Rehab  Date  06/06/18  Educator  [facility]  Instruction Review Code  5- Refused Teaching      Know Your Numbers and Risk Factors: -Group verbal and written instruction about important numbers in your health.  Discussion of what are risk factors and how they play a role in the disease process.  Review of Cholesterol, Blood Pressure, Diabetes, and BMI and the role they play in your overall health.   Sleep  Hygiene: -Provides group verbal and written instruction about how sleep can affect your health.  Define sleep hygiene, discuss sleep cycles and impact of sleep habits. Review good sleep hygiene tips.    Pulmonary Rehab from 06/06/2018 in [facility] Cardiac and Pulmonary Rehab  Date  05/07/18  Educator  [facility]  Instruction Review Code  1- Verbalizes Understanding      Other: -Provides group and verbal instruction on various topics (see comments)    Knowledge Questionnaire Score: Knowledge Questionnaire Score - 03/26/18 1205      Knowledge Questionnaire Score   Pre Score  15/18   reviewed with patient       Core Components/Risk Factors/Patient Goals at Admission: Personal  Goals and Risk Factors at Admission - 03/24/18 1448      Core Components/Risk Factors/Patient Goals on Admission    Weight Management  Yes;Weight Maintenance;Weight Loss    Intervention  Weight Management: Develop a combined nutrition and exercise program designed to reach desired caloric intake, while maintaining appropriate intake of nutrient and fiber, sodium and fats, and appropriate energy expenditure required for the weight goal.;Weight Management: Provide education and appropriate resources to help participant work on and attain dietary goals.;Weight Management/Obesity: Establish reasonable short term and long term weight goals.;Obesity: Provide education and appropriate resources to help participant work on and attain dietary goals.    Admit Weight  200 lb 3.2 oz (90.8 kg)    Goal Weight: Short Term  195 lb (88.5 kg)    Goal Weight: Long Term  185 lb (83.9 kg)    Expected Outcomes  Short Term: Continue to assess and modify interventions until short term weight is achieved;Long Term: Adherence to nutrition and physical activity/exercise program aimed toward attainment of established weight goal;Weight Loss: Understanding of general recommendations for a balanced deficit meal plan, which promotes 1-2 lb weight loss per week  and includes a negative energy balance of [phone] kcal/d;Understanding recommendations for meals to include 15-35% energy as protein, 25-35% energy from fat, 35-60% energy from carbohydrates, less than 265m of dietary cholesterol, 20-35 gm of total fiber daily;Understanding of distribution of calorie intake throughout the day with the consumption of 4-5 meals/snacks    Improve shortness of breath with ADL's  Yes    Intervention  Provide education, individualized exercise plan and daily activity instruction to help decrease symptoms of SOB with activities of daily living.    Expected Outcomes  Short Term: Improve cardiorespiratory fitness to achieve a reduction of symptoms when performing ADLs;Long Term: Be able to perform more ADLs without symptoms or delay the onset of symptoms    Heart Failure  Yes    Intervention  Provide a combined exercise and nutrition program that is supplemented with education, support and counseling about heart failure. Directed toward relieving symptoms such as shortness of breath, decreased exercise tolerance, and extremity edema.    Expected Outcomes  Improve functional capacity of life;Short term: Attendance in program 2-3 days a week with increased exercise capacity. Reported lower sodium intake. Reported increased fruit and vegetable intake. Reports medication compliance.;Short term: Daily weights obtained and reported for increase. Utilizing diuretic protocols set by physician.;Long term: Adoption of self-care skills and reduction of barriers for early signs and symptoms recognition and intervention leading to self-care maintenance.    Hypertension  Yes    Intervention  Provide education on lifestyle modifcations including regular physical activity/exercise, weight management, moderate sodium restriction and increased consumption of fresh fruit, vegetables, and low fat dairy, alcohol moderation, and smoking cessation.;Monitor prescription use compliance.    Expected  Outcomes  Short Term: Continued assessment and intervention until BP is < 140/93mHG in hypertensive participants. < 130/8075mG in hypertensive participants with diabetes, heart failure or chronic kidney disease.;Long Term: Maintenance of blood pressure at goal levels.    Lipids  Yes    Intervention  Provide education and support for participant on nutrition & aerobic/resistive exercise along with prescribed medications to achieve LDL <64m18mDL >40mg66m Expected Outcomes  Short Term: Participant states understanding of desired cholesterol values and is compliant with medications prescribed. Participant is following exercise prescription and nutrition guidelines.;Long Term: Cholesterol controlled with medications as prescribed, with individualized exercise RX and with  personalized nutrition plan. Value goals: LDL < 5m, HDL > 40 mg.       Core Components/Risk Factors/Patient Goals Review:  Goals and Risk Factor Review    Row Name 04/21/18 1449 06/02/18 1041           Core Components/Risk Factors/Patient Goals Review   Personal Goals Review  Hypertension;Diabetes;Improve shortness of breath with ADL's;Weight Management/Obesity  Hypertension;Diabetes;Improve shortness of breath with ADL's;Other      Review  William it hard to do things at home due to his back pain. He has recently had shots in his back to help. He says that he may get a procedure to burn the nerves in his back so it can reduce his pain. His blood pressure has been good. He has not been hypertensive. He is checking his blood sugar at home and continues to do so.   William Bray back injections.  He is able to get around to do more at home.  He is checking BP and BG at home.  He also takes all meds as directed.  He can walk up side steps - 5 - without stopping.      Expected Outcomes  Short: Attend [name] regularly to improve shortness of breath with ADL's. Long: maintain independence with ADL's   Short  - continue to attend LW regularly Long - maintain independence with ADL's          Core Components/Risk Factors/Patient Goals at Discharge (Final Review):  Goals and Risk Factor Review - 06/02/18 1041      Core Components/Risk Factors/Patient Goals Review   Personal Goals Review  Hypertension;Diabetes;Improve shortness of breath with ADL's;Other    Review  PHussamhas been better with ADL's Bray back injections.  He is able to get around to do more at home.  He is checking BP and BG at home.  He also takes all meds as directed.  He can walk up side steps - 5 - without stopping.    Expected Outcomes  Short - continue to attend LW regularly Long - maintain independence with ADL's        ITP Comments: ITP Comments    Row Name 03/24/18 1534 04/14/18 0826 05/12/18 0820 06/09/18 0824     ITP Comments  Medical evaluation completed today.  Pt started on visit 15 today.  New ITP created and sent for review to Dr. MEmily Filbert Medical Director.  Documentation for diagnosis from [facility] encounter 03/18/18 and 09/16/17.  30 day review completed. ITP sent to Dr. MEmily FilbertDirector of [place] Continue with ITP unless changes are made by physician.  30 day review completed. ITP sent to Dr. MEmily FilbertDirector of [place] Continue with ITP unless changes are made by physician.  30 day review completed. ITP sent to Dr. MEmily FilbertDirector of [place] Continue with ITP unless changes are made by physician.       Comments: 30 day review

## 2018-06-09 NOTE — Progress Notes (Signed)
Daily Session Note  Patient Details  Name: William Bray MRN: [id number] Date of Birth: 1950-06-17 Referring Provider:     Pulmonary Rehab from 03/24/2018 in [facility] Cardiac and Pulmonary Rehab  Referring Provider  Kelle Darting MD      Encounter Date: 06/09/2018  Check In: Session Check In - 06/09/18 1012      Check-In   Supervising physician immediately available to respond to emergencies  [name] immediately available ER MD    Physician(s)  Dr. Kerman Passey Dr. Cinda Quest    Location  [facility]-Cardiac & Pulmonary Rehab    Staff Present  Earlean Shawl, BS, ACSM CEP, Exercise Physiologist;Joseph [facility], [place], ACSM CEP, Exercise Physiologist    Medication changes reported      No    Fall or balance concerns reported     No    Tobacco Cessation  No Change    Warm-up and Cool-down  Performed as group-led instruction    Resistance Training Performed  Yes    VAD Patient?  Yes    PAD/SET Patient?  No      VAD patient   Has back up controller?  Yes    Has spare charged batteries?  Yes    Has battery cables?  Yes    Has compatible battery clips?  Yes      Pain Assessment   Currently in Pain?  No/denies    Multiple Pain Sites  No          Social History   Tobacco Use  Smoking Status Former Smoker  . Packs/day: 1.00  . Years: 45.00  . Pack years: 45.00  . Types: Cigarettes  . Last attempt to quit: 05/28/2005  . Years since quitting: 13.0  Smokeless Tobacco Never Used    Goals Met:  Proper associated with RPD/PD & O2 Sat Independence with exercise equipment Exercise tolerated well No report of cardiac concerns or symptoms Strength training completed today  Goals Unmet:  Not Applicable  Comments: Pt able to follow exercise prescription today without complaint.  Will continue to monitor for progression.    Dr. Emily Filbert is Medical Director for [place] and [name].

## 2018-06-11 VITALS — Ht 69.0 in | Wt 191.1 lb

## 2018-06-11 DIAGNOSIS — J449 Chronic obstructive pulmonary disease, unspecified: Secondary | ICD-10-CM

## 2018-06-11 NOTE — Progress Notes (Signed)
Daily Session Note  Patient Details  Name: William Bray MRN: [id number] Date of Birth: 1950/12/14 Referring Provider:     Pulmonary Rehab from 03/24/2018 in [facility] Cardiac and Pulmonary Rehab  Referring Provider  Kelle Darting MD      Encounter Date: 06/11/2018  Check In: Session Check In - 06/11/18 0951      Check-In   Supervising physician immediately available to respond to emergencies  [name] immediately available ER MD    Physician(s)  Dr. Joni Fears and Jimmye Norman    Location  [facility]-Cardiac & Pulmonary Rehab    Staff Present  Justin Mend RCP,RRT,BSRT;Amanda Oletta Darter, BA, ACSM CEP, Exercise Physiologist;Jessica Luan Pulling, MA, RCEP, CCRP, Exercise Physiologist    Medication changes reported      No    Fall or balance concerns reported     No    Warm-up and Cool-down  Performed as group-led instruction    Resistance Training Performed  Yes    VAD Patient?  Yes    PAD/SET Patient?  No      VAD patient   Has back up controller?  Yes    Has spare charged batteries?  Yes    Has battery cables?  Yes    Has compatible battery clips?  Yes      Pain Assessment   Currently in Pain?  No/denies    Multiple Pain Sites  No          Social History   Tobacco Use  Smoking Status Former Smoker  . Packs/day: 1.00  . Years: 45.00  . Pack years: 45.00  . Types: Cigarettes  . Last attempt to quit: 05/28/2005  . Years since quitting: 13.0  Smokeless Tobacco Never Used    Goals Met:  Independence with exercise equipment Exercise tolerated well No report of cardiac concerns or symptoms Strength training completed today  Goals Unmet:  Not Applicable  Comments: Pt able to follow exercise prescription today without complaint.  Will continue to monitor for progression. [place] Name 03/24/18 1544 06/11/18 1035       6 Minute Walk   Phase  Initial  Discharge    Distance  760 feet  1002 feet    Distance % Change  -  31.8 %    Distance Feet Change  -  242 ft    Walk  Time  4.6 minutes  6 minutes    # of Rest Breaks  3 15 sec, 9 sec, 1 min  0    MPH  1.88  1.9    METS  2.39  2.83    RPE  13  13    Perceived Dyspnea   2  2    VO2 Peak  8.36  9.91    Symptoms  Yes (comment)  Yes (comment)    Comments  SOB, back and leg pain 8/10  SOB, calves burning 3/10    Resting HR  92 bpm  89 bpm    Resting BP  132/64  120/64    Resting Oxygen Saturation   95 %  99 %    Exercise Oxygen Saturation  during 6 min walk  86 %  92 %    Max Ex. HR  132 bpm  128 bpm    Max Ex. BP  136/64  134/70    2 Minute Post BP  124/62  134/70      Interval HR   1 Minute HR  83  128  2 Minute HR  81  124    3 Minute HR  94  109    4 Minute HR  98  114    5 Minute HR  117  112    6 Minute HR  132  112    2 Minute Post HR  108  105    Interval Heart Rate?  Yes  Yes      Interval Oxygen   Interval Oxygen?  Yes  Yes    Baseline Oxygen Saturation %  95 %  99 %    1 Minute Oxygen Saturation %  92 %  95 %    1 Minute Liters of Oxygen  3 L  4 L    2 Minute Oxygen Saturation %  88 % rest break 2:20-2:35  94 %    2 Minute Liters of Oxygen  3 L  4 L    3 Minute Oxygen Saturation %  89 % rest break 3:14-3:23  94 %    3 Minute Liters of Oxygen  3 L  4 L    4 Minute Oxygen Saturation %  90 % rest break 4:13-5:13  94 %    4 Minute Liters of Oxygen  3 L  4 L    5 Minute Oxygen Saturation %  86 %  94 %    5 Minute Liters of Oxygen  3 L  4 L    6 Minute Oxygen Saturation %  88 %  92 %    6 Minute Liters of Oxygen  3 L  4 L    2 Minute Post Oxygen Saturation %  90 %  94 %    2 Minute Post Liters of Oxygen  3 L  4 L         Dr. Emily Filbert is Medical Director for [place] and [name].

## 2018-06-16 ENCOUNTER — Encounter: Payer: Medicare Other | Admitting: *Deleted

## 2018-06-16 DIAGNOSIS — J449 Chronic obstructive pulmonary disease, unspecified: Secondary | ICD-10-CM

## 2018-06-16 DIAGNOSIS — I5022 Chronic systolic (congestive) heart failure: Secondary | ICD-10-CM

## 2018-06-16 DIAGNOSIS — Z95811 Presence of heart assist device: Secondary | ICD-10-CM

## 2018-06-16 NOTE — Progress Notes (Signed)
Daily Session Note  Patient Details  Name: William Bray MRN: [id number] Date of Birth: 1950-10-07 Referring Provider:     Pulmonary Rehab from 03/24/2018 in [facility] Cardiac and Pulmonary Rehab  Referring Provider  Kelle Darting MD      Encounter Date: 06/16/2018  Check In: Session Check In - 06/16/18 1016      Check-In   Supervising physician immediately available to respond to emergencies  [name] immediately available ER MD    Physician(s)  Dr. Quentin Cornwall and Dr. Jimmye Norman    Location  [facility]-Cardiac & Pulmonary Rehab    Staff Present  Earlean Shawl, BS, ACSM CEP, Exercise Physiologist;Joseph [facility], [place], ACSM CEP, Exercise Physiologist    Medication changes reported      No    Fall or balance concerns reported     No    Tobacco Cessation  No Change    Warm-up and Cool-down  Performed as group-led instruction    Resistance Training Performed  Yes    VAD Patient?  Yes    PAD/SET Patient?  No      VAD patient   Has back up controller?  Yes    Has spare charged batteries?  Yes    Has battery cables?  Yes    Has compatible battery clips?  Yes      Pain Assessment   Currently in Pain?  No/denies    Multiple Pain Sites  No          Social History   Tobacco Use  Smoking Status Former Smoker  . Packs/day: 1.00  . Years: 45.00  . Pack years: 45.00  . Types: Cigarettes  . Last attempt to quit: 05/28/2005  . Years since quitting: 13.0  Smokeless Tobacco Never Used    Goals Met:  Proper associated with RPD/PD & O2 Sat Independence with exercise equipment Exercise tolerated well No report of cardiac concerns or symptoms Strength training completed today  Goals Unmet:  Not Applicable  Comments: Pt able to follow exercise prescription today without complaint.  Will continue to monitor for progression.    Dr. Emily Filbert is Medical Director for [place] and [name].

## 2018-06-18 ENCOUNTER — Encounter: Payer: Medicare Other | Admitting: *Deleted

## 2018-06-18 DIAGNOSIS — J449 Chronic obstructive pulmonary disease, unspecified: Secondary | ICD-10-CM

## 2018-06-18 NOTE — Progress Notes (Signed)
Daily Session Note  Patient Details  Name: William Bray MRN: [id number] Date of Birth: November 12, 1950 Referring Provider:     Pulmonary Rehab from 03/24/2018 in [facility] Cardiac and Pulmonary Rehab  Referring Provider  Kelle Darting MD      Encounter Date: 06/18/2018  Check In: Session Check In - 06/18/18 1044      Check-In   Supervising physician immediately available to respond to emergencies  [name] immediately available ER MD    Physician(s)  Drs. Kipp Laurence    Location  [facility]-Cardiac & Pulmonary Rehab    Staff Present  [name], MA, RCEP, CCRP, Exercise Physiologist;Joseph [place], [place], ACSM CEP, Exercise Physiologist    Medication changes reported      No    Fall or balance concerns reported     No    Warm-up and Cool-down  Performed as group-led instruction    Resistance Training Performed  Yes    VAD Patient?  Yes    PAD/SET Patient?  No      VAD patient   Has back up controller?  Yes    Has spare charged batteries?  Yes    Has battery cables?  Yes    Has compatible battery clips?  Yes      Pain Assessment   Currently in Pain?  No/denies          Social History   Tobacco Use  Smoking Status Former Smoker  . Packs/day: 1.00  . Years: 45.00  . Pack years: 45.00  . Types: Cigarettes  . Last attempt to quit: 05/28/2005  . Years since quitting: 13.0  Smokeless Tobacco Never Used    Goals Met:  Proper associated with RPD/PD & O2 Sat Independence with exercise equipment Using PLB without cueing & demonstrates good technique Exercise tolerated well No report of cardiac concerns or symptoms Strength training completed today  Goals Unmet:  Not Applicable  Comments: Pt able to follow exercise prescription today without complaint.  Will continue to monitor for progression.    Dr. Emily Filbert is Medical Director for [place] and [name].

## 2018-06-23 ENCOUNTER — Encounter: Payer: Medicare Other | Admitting: *Deleted

## 2018-06-23 DIAGNOSIS — I5022 Chronic systolic (congestive) heart failure: Secondary | ICD-10-CM

## 2018-06-23 DIAGNOSIS — Z95811 Presence of heart assist device: Secondary | ICD-10-CM

## 2018-06-23 DIAGNOSIS — J449 Chronic obstructive pulmonary disease, unspecified: Secondary | ICD-10-CM | POA: Diagnosis not present

## 2018-06-23 NOTE — Progress Notes (Signed)
Daily Session Note  Patient Details  Name: William Bray MRN: [id number] Date of Birth: [birth year] Referring Provider:     Pulmonary Rehab from 03/24/2018 in [facility] Cardiac and Pulmonary Rehab  Referring Provider  Kelle Darting MD      Encounter Date: 06/23/2018  Check In: Session Check In - 06/23/18 1010      Check-In   Supervising physician immediately available to respond to emergencies  [name] immediately available ER MD    Physician(s)  Dr. Jodell Cipro and Dr. Quentin Cornwall    Location  [facility]-Cardiac & Pulmonary Rehab    Staff Present  Earlean Shawl, BS, ACSM CEP, Exercise Physiologist;Joseph [facility], [place], ACSM CEP, Exercise Physiologist    Medication changes reported      No    Fall or balance concerns reported     No    Tobacco Cessation  No Change    Warm-up and Cool-down  Performed as group-led instruction    Resistance Training Performed  Yes    VAD Patient?  Yes    PAD/SET Patient?  No      VAD patient   Has back up controller?  Yes    Has spare charged batteries?  Yes    Has battery cables?  Yes    Has compatible battery clips?  Yes          Social History   Tobacco Use  Smoking Status Former Smoker  . Packs/day: 1.00  . Years: 45.00  . Pack years: 45.00  . Types: Cigarettes  . Last attempt to quit: 05/28/2005  . Years since quitting: 13.0  Smokeless Tobacco Never Used    Goals Met:  Proper associated with RPD/PD & O2 Sat Independence with exercise equipment Exercise tolerated well No report of cardiac concerns or symptoms Strength training completed today  Goals Unmet:  Not Applicable  Comments: Pt able to follow exercise prescription today without complaint.  Will continue to monitor for progression.    Dr. Emily Filbert is Medical Director for [place] and [name].

## 2018-06-27 DIAGNOSIS — J449 Chronic obstructive pulmonary disease, unspecified: Secondary | ICD-10-CM | POA: Diagnosis not present

## 2018-06-27 DIAGNOSIS — Z95811 Presence of heart assist device: Secondary | ICD-10-CM

## 2018-06-27 DIAGNOSIS — I5022 Chronic systolic (congestive) heart failure: Secondary | ICD-10-CM

## 2018-06-27 NOTE — Progress Notes (Signed)
Daily Session Note  Patient Details  Name: William Bray MRN: [id number] Date of Birth: 12-12-1950 Referring Provider:     Pulmonary Rehab from 03/24/2018 in [facility] Cardiac and Pulmonary Rehab  Referring Provider  Kelle Darting MD      Encounter Date: 06/27/2018  Check In:      Social History   Tobacco Use  Smoking Status Former Smoker  . Packs/day: 1.00  . Years: 45.00  . Pack years: 45.00  . Types: Cigarettes  . Last attempt to quit: 05/28/2005  . Years since quitting: 13.0  Smokeless Tobacco Never Used    Goals Met:  Proper associated with RPD/PD & O2 Sat Independence with exercise equipment Exercise tolerated well Strength training completed today  Goals Unmet:  Not Applicable  Comments: Pt able to follow exercise prescription today without complaint.  Will continue to monitor for progression.  William Bray graduated today from  rehab with 36 sessions completed.  Details of the patient's exercise prescription and what He needs to do in order to continue the prescription and progress were discussed with patient.  Patient was given a copy of prescription and goals.  Patient verbalized understanding.  William Bray plans to continue to exercise by attending [facility].  Dr. Emily Filbert is Medical Director for [place] and [name].

## 2018-06-27 NOTE — Progress Notes (Signed)
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  albuterol (PROVENTIL) (2.5 MG/3ML) 0.083% nebulizer solution, Inhale 3 mLs into the lungs every 6 (six) hours as needed., Disp: , Rfl:  .  allopurinol (ZYLOPRIM) 100 MG tablet, Take 100 mg by mouth daily., Disp: , Rfl:  .  ALPRAZolam (XANAX) 0.5 MG tablet, Take 0.5 mg by mouth 2 (two) times daily as needed. ONE IN THE MORNING AND ONE AT BEDTIME PRN, Disp: , Rfl:  .  aspirin EC 81 MG tablet, Take 81 mg by mouth daily., Disp: , Rfl:  .  budesonide (PULMICORT) 0.5 MG/2ML nebulizer solution, Inhale 2 mLs into the lungs 2 (two) times daily., Disp: , Rfl:  .  cetirizine (ZYRTEC) 10 MG tablet, Take 10 mg by mouth daily., Disp: , Rfl:  .  cyanocobalamin 100 MCG tablet, Take 100 mcg by mouth daily., Disp: , Rfl:  .  ferrous sulfate 324 (65 Fe) MG TBEC, Take 324 mg by mouth every morning. WITH BREAKFAST, Disp: , Rfl:  .  FLUoxetine (PROZAC) 20 MG capsule, Take 80 mg by mouth daily., Disp: , Rfl:  .  gabapentin (NEURONTIN) 300 MG capsule, Take 600 mg by mouth 3 (three) times daily. , Disp: , Rfl:  .  lisinopril (PRINIVIL,ZESTRIL) 2.5 MG tablet, Take 2.5 mg by mouth daily., Disp: , Rfl:  .  magnesium oxide (MAG-OX) 400 MG tablet, Take 400 mg by mouth daily., Disp: , Rfl:  .  metoprolol tartrate (LOPRESSOR) 25 MG tablet, Take 25 mg by mouth 2 (two) times daily., Disp: , Rfl:  .   mirtazapine (REMERON) 30 MG tablet, Take 0.5 tablets by mouth at bedtime., Disp: , Rfl:  .  montelukast (SINGULAIR) 10 MG tablet, Take 10 mg by mouth at bedtime., Disp: , Rfl:  .  Multiple Vitamins-Minerals (MULTIVITAMIN WITH MINERALS) tablet, Take 1 tablet by mouth daily., Disp: , Rfl:  .  ondansetron (ZOFRAN) 4 MG tablet, Take 4 mg by mouth every 8 (eight) hours as needed for nausea., Disp: , Rfl:  .  oxycodone (OXY-IR) 5 MG capsule, Take 5 mg by mouth every 4 (four) hours as needed., Disp: , Rfl:  .  pantoprazole (PROTONIX) 40 MG tablet, Take 40 mg by mouth daily., Disp: , Rfl:  .  pravastatin (PRAVACHOL) 40 MG tablet, Take 40 mg by mouth at bedtime., Disp: , Rfl:  .  spironolactone (ALDACTONE) 25 MG tablet, Take 25 mg by mouth daily., Disp: , Rfl:  .  torsemide (DEMADEX) 20 MG tablet, Take 2 tablets by mouth daily., Disp: , Rfl:  .  umeclidinium-vilanterol (ANORO ELLIPTA) 62.5-25 MCG/INH AEPB, Inhale 1 puff into the lungs daily., Disp: , Rfl:  .  warfarin (COUMADIN) 3 MG tablet, Take 3 mg by mouth 3 (three) times a week., Disp: , Rfl:  .  warfarin (COUMADIN) 4 MG tablet, Take 4 mg by mouth 4 (four) times a week., Disp: , Rfl:  .  warfarin (COUMADIN) 5 MG tablet, Take 5 mg by mouth as directed. Taking 5 mg M, W, F and 4 mg T, Th, Sat, Sun., Disp: , Rfl:   Past Medical History: Past Medical History:  Diagnosis Date  . Arthritis   . Chronic back pain   . Diabetes mellitus without complication ([place])   . Hypertension   . Neuropathy     Tobacco Use: Social History   Tobacco Use  Smoking Status Former Smoker  . Packs/day: 1.00  . Years: 45.00  . Pack years: 45.00  . Types: Cigarettes  . Last attempt to quit: 05/28/2005  . Years since quitting: 13.0  Smokeless Tobacco Never Used    Labs: Recent Review Flowsheet Data    Labs for ITP Cardiac and Pulmonary Rehab Latest Ref Rng & Units 08/08/2012 04/28/2013 11/05/2013 05/30/2014 06/02/2014   Cholestrol 0 - 200 mg/dL 105 - 127 64 -   LDLCALC  0 - 100 mg/dL 37 - 74 27 -   HDL 40 - 60 mg/dL 33(L) - 33(L) 15(L) -   Trlycerides 0 - 200 mg/dL 173 - 99 108 -   Hemoglobin A1c 4.2 - 6.3 % - 5.9 - - 5.3       Pulmonary Assessment Scores: Pulmonary Assessment Scores    Row Name 03/24/18 1530 03/26/18 1202 06/06/18 1130     ADL UCSD   ADL Phase  Entry  Entry  Exit   SOB Score total  -  51  47   Rest  -  0  0   Walk  -  1  1   Stairs  -  5  4   Bath  -  1  1   Dress  -  1  1   Shop  -  1  3     CAT Score   CAT Score  -  19  19     mMRC Score   mMRC Score  3  3  -   Row Name 06/11/18 1038         ADL UCSD   ADL Phase  Exit     SOB Score total  47     Rest  0     Walk  1     Stairs  4     Bath  1     Dress  1     Shop  3       CAT Score   CAT Score  19       mMRC Score   mMRC Score  1        Pulmonary Function Assessment:   Exercise Target Goals: Exercise Program Goal: Individual exercise prescription set using results from initial 6 min walk test and THRR while considering  patient's activity barriers and safety.   Exercise Prescription Goal: Initial exercise prescription builds to 30-45 minutes a day of aerobic activity, 2-3 days per week.  Home exercise guidelines will be given to patient during program as part of exercise prescription that the participant will acknowledge.  Activity Barriers & Risk Stratification: Activity Barriers & Cardiac Risk Stratification - 03/24/18 1548      Activity Barriers & Cardiac Risk Stratification   Activity Barriers  Deconditioning;Muscular Weakness;Shortness of Breath;Decreased Ventricular Function;Balance Concerns;Back Problems       6 Minute Walk: 6 Minute Walk    Row Name 03/24/18 1544 06/11/18 1035       6 Minute Walk   Phase  Initial  Discharge    Distance  760 feet  1002 feet    Distance % Change  -  31.8 %    Distance Feet Change  -  242 ft    Walk Time  4.6 minutes  6 minutes    # of Rest Breaks  3 15 sec, 9 sec, 1 min  0    MPH  1.88  1.9     METS  2.39  2.83    RPE  13  13    Perceived Dyspnea   2  2    VO2 Peak  8.36  9.91    Symptoms  Yes (comment)  Yes (comment)    Comments  SOB, back and leg pain 8/10  SOB, calves burning 3/10    Resting HR  92 bpm  89 bpm    Resting BP  132/64  120/64    Resting Oxygen Saturation   95 %  99 %    Exercise Oxygen Saturation  during 6 min walk  86 %  92 %    Max Ex. HR  132 bpm  128 bpm    Max Ex. BP  136/64  134/70    2 Minute Post BP  124/62  134/70      Interval HR   1 Minute HR  83  128    2 Minute HR  81  124    3 Minute HR  94  109    4 Minute HR  98  114    5 Minute HR  117  112    6 Minute HR  132  112    2 Minute Post HR  108  105    Interval Heart Rate?  Yes  Yes      Interval Oxygen   Interval Oxygen?  Yes  Yes    Baseline Oxygen Saturation %  95 %  99 %    1 Minute Oxygen Saturation %  92 %  95 %    1 Minute Liters of Oxygen  3 L  4 L    2 Minute Oxygen Saturation %  88 % rest break 2:20-2:35  94 %    2 Minute Liters of Oxygen  3 L  4 L    3 Minute Oxygen Saturation %  89 % rest break 3:14-3:23  94 %    3 Minute Liters of Oxygen  3 L  4 L    4 Minute Oxygen Saturation %  90 % rest break 4:13-5:13  94 %    4 Minute Liters of Oxygen  3 L  4 L    5 Minute Oxygen Saturation %  86 %  94 %    5 Minute Liters of Oxygen  3 L  4 L    6 Minute Oxygen Saturation %  88 %  92 %    6 Minute Liters of Oxygen  3 L  4 L    2 Minute Post Oxygen Saturation %  90 %  94 %    2 Minute Post Liters of Oxygen  3 L  4 L      Oxygen Initial Assessment: Oxygen Initial Assessment - 03/24/18 1445      Home Oxygen   Home Oxygen Device  Home Concentrator;Portable Concentrator;E-Tanks    Sleep Oxygen Prescription  Continuous   Now using Trilogy machine versus CPAP   Liters per minute  2    Home Exercise Oxygen Prescription  Continuous  Liters per minute  3    Home at Rest Exercise Oxygen Prescription  Continuous    Liters per minute  2    Compliance with Home Oxygen Use  Yes       Initial 6 min Walk   Oxygen Used  Portable Concentrator;Pulsed      Program Oxygen Prescription   Program Oxygen Prescription  Continuous;E-Tanks    Liters per minute  3      Intervention   Short Term Goals  To learn and exhibit compliance with exercise, home and travel O2 prescription;To learn and understand importance of maintaining oxygen saturations>88%;To learn and demonstrate proper use of respiratory medications;To learn and demonstrate proper pursed lip breathing techniques or other breathing techniques.;To learn and understand importance of monitoring SPO2 with pulse oximeter and demonstrate accurate use of the pulse oximeter.    Long  Term Goals  Exhibits compliance with exercise, home and travel O2 prescription;Verbalizes importance of monitoring SPO2 with pulse oximeter and return demonstration;Maintenance of O2 saturations>88%;Exhibits proper breathing techniques, such as pursed lip breathing or other method taught during program session;Compliance with respiratory medication;Demonstrates proper use of MDI's       Oxygen Re-Evaluation: Oxygen Re-Evaluation    Row Name 03/26/18 1059 04/21/18 1440 05/30/18 1034         Program Oxygen Prescription   Program Oxygen Prescription  -  Continuous;E-Tanks  Continuous;E-Tanks     Liters per minute  -  3  3       Home Oxygen   Home Oxygen Device  -  Home Concentrator;Portable Concentrator;E-Tanks  Home Concentrator;Portable Concentrator;E-Tanks     Sleep Oxygen Prescription  -  Continuous  Continuous     Liters per minute  -  2  2     Home Exercise Oxygen Prescription  -  Continuous  Continuous     Liters per minute  -  3  3     Home at Rest Exercise Oxygen Prescription  -  Continuous  Continuous     Liters per minute  -  2  2     Compliance with Home Oxygen Use  -  Yes  Yes       Goals/Expected Outcomes   Short Term Goals  To learn and understand importance of maintaining oxygen saturations>88%;To learn and demonstrate proper  pursed lip breathing techniques or other breathing techniques.;To learn and understand importance of monitoring SPO2 with pulse oximeter and demonstrate accurate use of the pulse oximeter.;To learn and exhibit compliance with exercise, home and travel O2 prescription  To learn and understand importance of maintaining oxygen saturations>88%;To learn and demonstrate proper pursed lip breathing techniques or other breathing techniques.;To learn and understand importance of monitoring SPO2 with pulse oximeter and demonstrate accurate use of the pulse oximeter.;To learn and exhibit compliance with exercise, home and travel O2 prescription  To learn and understand importance of maintaining oxygen saturations>88%;To learn and demonstrate proper pursed lip breathing techniques or other breathing techniques.;To learn and understand importance of monitoring SPO2 with pulse oximeter and demonstrate accurate use of the pulse oximeter.;To learn and exhibit compliance with exercise, home and travel O2 prescription     Long  Term Goals  Exhibits compliance with exercise, home and travel O2 prescription;Verbalizes importance of monitoring SPO2 with pulse oximeter and return demonstration;Maintenance of O2 saturations>88%;Exhibits proper breathing techniques, such as pursed lip breathing or other method taught during program session  Exhibits compliance with exercise, home and travel O2 prescription;Verbalizes importance of monitoring SPO2 with  pulse oximeter and return demonstration;Maintenance of O2 saturations>88%;Exhibits proper breathing techniques, such as pursed lip breathing or other method taught during program session  Exhibits compliance with exercise, home and travel O2 prescription;Verbalizes importance of monitoring SPO2 with pulse oximeter and return demonstration;Maintenance of O2 saturations>88%;Exhibits proper breathing techniques, such as pursed lip breathing or other method taught during program session       Comments  Reviewed PLB technique with pt.  Talked about how it work and it's important to maintaining his exercise saturations.    Patient has been checking his oxygen at home regularly. He is practicing his PLB techniques also. He uses his incentive spirometer everyday to help strengthen his breathing. His oxygen has been good when exercising and has been trying to decrease his oxygen. He was out for being sick and had a procedure done so he has been wearing 3 liters, Patient will try to decrease oxygen to 2 liters when exercising.  Bekker has been able to use 2 liters at times when exercising. He is using PLB at home and uses 2 liters at rest. He still wants to get down to using 2 liters of oxygen with exertion.     Goals/Expected Outcomes  Short: Become more profiecient at using PLB.   Long: Become independent at using PLB.  Short: decrease oxygen to 2 liters when exercising. Long: decrease oxygen to 1 liter while exercising.  Short: continue to monitor oxygen at home and decrease to 2L while exercising. Long: maintain 2 liters of oxygen on exertion.        Oxygen Discharge (Final Oxygen Re-Evaluation): Oxygen Re-Evaluation - 05/30/18 1034      Program Oxygen Prescription   Program Oxygen Prescription  Continuous;E-Tanks    Liters per minute  3      Home Oxygen   Home Oxygen Device  Home Concentrator;Portable Concentrator;E-Tanks    Sleep Oxygen Prescription  Continuous    Liters per minute  2    Home Exercise Oxygen Prescription  Continuous    Liters per minute  3    Home at Rest Exercise Oxygen Prescription  Continuous    Liters per minute  2    Compliance with Home Oxygen Use  Yes      Goals/Expected Outcomes   Short Term Goals  To learn and understand importance of maintaining oxygen saturations>88%;To learn and demonstrate proper pursed lip breathing techniques or other breathing techniques.;To learn and understand importance of monitoring SPO2 with pulse oximeter and demonstrate  accurate use of the pulse oximeter.;To learn and exhibit compliance with exercise, home and travel O2 prescription    Long  Term Goals  Exhibits compliance with exercise, home and travel O2 prescription;Verbalizes importance of monitoring SPO2 with pulse oximeter and return demonstration;Maintenance of O2 saturations>88%;Exhibits proper breathing techniques, such as pursed lip breathing or other method taught during program session    Comments  Zeiter has been able to use 2 liters at times when exercising. He is using PLB at home and uses 2 liters at rest. He still wants to get down to using 2 liters of oxygen with exertion.    Goals/Expected Outcomes  Short: continue to monitor oxygen at home and decrease to 2L while exercising. Long: maintain 2 liters of oxygen on exertion.       Initial Exercise Prescription: Initial Exercise Prescription - 03/24/18 1500      Date of Initial Exercise RX and Referring Provider   Date  03/24/18    Referring Provider  Kelle Darting  MD      Oxygen   Oxygen  Continuous    Liters  3      Treadmill   MPH  1.8    Grade  0    Minutes  15    METs  2.38      REL-XR   Level  1    Speed  50    Minutes  15    METs  2      T5 Nustep   Level  1    SPM  80    Minutes  15    METs  2      Prescription Details   Frequency (times per week)  3    Duration  Progress to 45 minutes of aerobic exercise without signs/symptoms of physical distress      Intensity   THRR 40-80% of Max Heartrate  116-141    Ratings of Perceived Exertion  11-13    Perceived Dyspnea  0-4      Progression   Progression  Continue to progress workloads to maintain intensity without signs/symptoms of physical distress.      Resistance Training   Training Prescription  Yes    Weight  3 lbs    Reps  10-15       Perform Capillary Blood Glucose checks as needed.  Exercise Prescription Changes: Exercise Prescription Changes    Row Name 03/24/18 1500 04/02/18 1200 04/16/18 1100  04/30/18 1200 05/14/18 1200     Response to Exercise   Blood Pressure (Admit)  132/64  110/70  96/58  110/70  108/60   Blood Pressure (Exercise)  136/64  118/70  -  -  -   Blood Pressure (Exit)  124/62  102/70  120/66  106/58  122/70   Heart Rate (Admit)  92 bpm  72 bpm  78 bpm  92 bpm  51 bpm   Heart Rate (Exercise)  132 bpm  95 bpm  86 bpm  145 bpm probably not correct  112 bpm   Heart Rate (Exit)  108 bpm  72 bpm  87 bpm  87 bpm  102 bpm   Oxygen Saturation (Admit)  95 %  92 %  95 %  95 %  92 %   Oxygen Saturation (Exercise)  86 %  92 %  87 %  93 %  93 %   Oxygen Saturation (Exit)  90 %  90 %  90 %  95 %  96 %   Rating of Perceived Exertion (Exercise)  13  12  13  15  13    Perceived Dyspnea (Exercise)  2  3  2  3  1    Symptoms  back and leg pain 8/10, SOB  -  -  -  none   Comments  walk test results  -  -  -  -   Duration  -  Progress to 45 minutes of aerobic exercise without signs/symptoms of physical distress  Progress to 45 minutes of aerobic exercise without signs/symptoms of physical distress  Progress to 45 minutes of aerobic exercise without signs/symptoms of physical distress  Progress to 30 minutes of  aerobic without signs/symptoms of physical distress   Intensity  -  THRR unchanged  THRR unchanged  THRR unchanged  THRR unchanged     Progression   Progression  -  Continue to progress workloads to maintain intensity without signs/symptoms of physical distress.  Continue to progress workloads to maintain intensity without signs/symptoms  of physical distress.  Continue to progress workloads to maintain intensity without signs/symptoms of physical distress.  Continue to progress workloads to maintain intensity without signs/symptoms of physical distress.   Average METs  -  2.4  3.2  2.4  2.69     Resistance Training   Training Prescription  -  Yes  Yes  Yes  Yes   Weight  -  3 lb  3 lb  3 lb  3   Reps  -  10-15  10-15  10-15  10-15     Interval Training   Interval Training  -   No  No  No  No     Oxygen   Oxygen  -  Continuous  Continuous  Continuous  Continuous   Liters  -  3  4  3  3      Treadmill   MPH  -  1.8  1.8  1.8  1.8   Grade  -  0  0  0  0   Minutes  -  15  15  15  15    METs  -  2.38  2.38  2.38  2.38     NuStep   Level  -  -  1  -  -   SPM  -  -  80  -  -   Minutes  -  -  15  -  -   METs  -  -  1.8  -  -     REL-XR   Level  -  4  1  1  2    Speed  -  50  50  50  50   Minutes  -  15  15  15  15    METs  -  3.1  5.4  2.4  2.9     T5 Nustep   Level  -  1  -  -  1   SPM  -  80  -  -  80   Minutes  -  15  -  -  15   METs  -  1.7  -  -  2.8   Row Name 05/27/18 1200 06/11/18 1100           Response to Exercise   Blood Pressure (Admit)  100/80  120/64      Blood Pressure (Exercise)  -  134/70      Blood Pressure (Exit)  88/60  130/80      Heart Rate (Admit)  90 bpm  65 bpm      Heart Rate (Exercise)  98 bpm  128 bpm      Heart Rate (Exit)  88 bpm  87 bpm      Oxygen Saturation (Admit)  93 %  91 %      Oxygen Saturation (Exercise)  92 %  92 %      Oxygen Saturation (Exit)  98 %  98 %      Rating of Perceived Exertion (Exercise)  14  13      Perceived Dyspnea (Exercise)  1  2      Symptoms  none  none      Duration  Progress to 45 minutes of aerobic exercise without signs/symptoms of physical distress  Continue with 45 min of aerobic exercise without signs/symptoms of physical distress.      Intensity  THRR unchanged  THRR unchanged        Progression  Progression  Continue to progress workloads to maintain intensity without signs/symptoms of physical distress.  Continue to progress workloads to maintain intensity without signs/symptoms of physical distress.      Average METs  2.3  2.83 walk test        Resistance Training   Training Prescription  Yes  Yes      Weight  3 lb  3 lb      Reps  10-15  10-15        Interval Training   Interval Training  No  No        Oxygen   Oxygen  Continuous  Continuous      Liters  3  3          Treadmill   MPH  1.8  -      Grade  0  -      Minutes  15  -      METs  2.38  -        NuStep   Level  1  -      SPM  80  -      Minutes  15  -      METs  2  -        REL-XR   Level  2  2      Speed  50  50      Minutes  15  15      METs  2.6  -         Exercise Comments: Exercise Comments    Row Name 03/26/18 1057 05/26/18 1316 06/27/18 1144       Exercise Comments  First full day of exercise!  Patient was oriented to gym and equipment including functions, settings, policies, and procedures.  Patient's individual exercise prescription and treatment plan were reviewed.  All starting workloads were established based on the results of the 6 minute walk test done at initial orientation visit.  The plan for exercise progression was also introduced and progression will be customized based on patient's performance and goals.  Patient wears an over the shoulder bag to exercise. Discussed different LVAD accessories that would protect his drive line and not pull on his neck especially during exercise. Recommended a conceal carry shirt or the holster vest. Requested that patient at least try the other options for safety.    Odilon graduated today from  rehab with 36 sessions completed.  Details of the patient's exercise prescription and what He needs to do in order to continue the prescription and progress were discussed with patient.  Patient was given a copy of prescription and goals.  Patient verbalized understanding.  Darshan plans to continue to exercise by [facility].        Exercise Goals and Review: Exercise Goals    Row Name 03/24/18 1552             Exercise Goals   Increase Physical Activity  Yes       Intervention  Provide advice, education, support and counseling about physical activity/exercise needs.;Develop an individualized exercise prescription for aerobic and resistive training based on initial evaluation findings, risk stratification, comorbidities and  participant's personal goals.       Expected Outcomes  Short Term: Attend rehab on a regular basis to increase amount of physical activity.;Long Term: Add in home exercise to make exercise part of routine and to increase amount of physical activity.;Long Term: Exercising regularly at least  3-5 days a week.       Increase Strength and Stamina  Yes       Intervention  Provide advice, education, support and counseling about physical activity/exercise needs.;Develop an individualized exercise prescription for aerobic and resistive training based on initial evaluation findings, risk stratification, comorbidities and participant's personal goals.       Expected Outcomes  Short Term: Increase workloads from initial exercise prescription for resistance, speed, and METs.;Short Term: Perform resistance training exercises routinely during rehab and add in resistance training at home;Long Term: Improve cardiorespiratory fitness, muscular endurance and strength as measured by increased METs and functional capacity (6MWT)       Able to understand and use rate of perceived exertion (RPE) scale  Yes       Intervention  Provide education and explanation on how to use RPE scale       Expected Outcomes  Long Term:  Able to use RPE to guide intensity level when exercising independently;Short Term: Able to use RPE daily in rehab to express subjective intensity level       Able to understand and use Dyspnea scale  Yes       Intervention  Provide education and explanation on how to use Dyspnea scale       Expected Outcomes  Long Term: Able to use Dyspnea scale to guide intensity level when exercising independently;Short Term: Able to use Dyspnea scale daily in rehab to express subjective sense of shortness of breath during exertion       Knowledge and understanding of Target Heart Rate Range (THRR)  Yes       Intervention  Provide education and explanation of THRR including how the numbers were predicted and where they are  located for reference       Expected Outcomes  Short Term: Able to state/look up THRR;Short Term: Able to use daily as guideline for intensity in rehab;Long Term: Able to use THRR to govern intensity when exercising independently       Able to check pulse independently  Yes       Intervention  Provide education and demonstration on how to check pulse in carotid and radial arteries.;Review the importance of being able to check your own pulse for safety during independent exercise       Expected Outcomes  Short Term: Able to explain why pulse checking is important during independent exercise;Long Term: Able to check pulse independently and accurately       Understanding of Exercise Prescription  Yes       Intervention  Provide education, explanation, and written materials on patient's individual exercise prescription       Expected Outcomes  Short Term: Able to explain program exercise prescription;Long Term: Able to explain home exercise prescription to exercise independently          Exercise Goals Re-Evaluation : Exercise Goals Re-Evaluation    [place] Name 03/26/18 1058 04/16/18 1201 04/30/18 1229 05/14/18 1253 05/27/18 1241     Exercise Goal Re-Evaluation   Exercise Goals Review  Increase Physical Activity;Able to understand and use rate of perceived exertion (RPE) scale;Knowledge and understanding of Target Heart Rate Range (THRR);Understanding of Exercise Prescription;Increase Strength and Stamina;Able to understand and use Dyspnea scale  Increase Physical Activity;Increase Strength and Stamina;Able to understand and use rate of perceived exertion (RPE) scale;Able to understand and use Dyspnea scale;Knowledge and understanding of Target Heart Rate Range (THRR);Understanding of Exercise Prescription  Increase Physical Activity;Increase Strength and Stamina;Able to understand and use  rate of perceived exertion (RPE) scale;Able to understand and use Dyspnea scale;Knowledge and understanding of Target  Heart Rate Range (THRR);Understanding of Exercise Prescription  Increase Physical Activity;Able to understand and use rate of perceived exertion (RPE) scale;Knowledge and understanding of Target Heart Rate Range (THRR);Understanding of Exercise Prescription;Increase Strength and Stamina;Able to understand and use Dyspnea scale;Able to check pulse independently  Increase Physical Activity;Increase Strength and Stamina;Able to understand and use rate of perceived exertion (RPE) scale;Knowledge and understanding of Target Heart Rate Range (THRR);Understanding of Exercise Prescription   Comments  Reviewed RPE scale, THR and program prescription with pt today.  Pt voiced understanding and was given a copy of goals to take home.   Paukis having a procedure for his back tomorrow that he hopes will help with back pain.  He states he wants to work as much as he can and back pain inhibits his improvement.  Meryl stated he can feel the difference this week with exercise.  He had injections for back pain that are helping him.   Lemuel continues to feel better and more able to exercise due to his back injections. Continue to monitor pain and progress as his pain will allow.  Fermon has been feeling better since getting injections for back pain.  This should help him attend more regularly and see better progress.   Expected Outcomes  Short: Use RPE daily to regulate intensity. Long: Follow program prescription in THR.  Short - get clearance afte rprocedure to return to exercise Long - maintain or improve MET level  Short - attend consistently Long - increase endurance and MET level  Short- attend class regularly, increase levels as pain allows. Long- increase MET level and graduate from program.  Short - attend LW 3 times per week Long - maintain or increase MET level   [place] Name 06/11/18 1126             Exercise Goal Re-Evaluation   Exercise Goals Review  Increase Physical Activity;Increase Strength and Stamina;Able to  understand and use rate of perceived exertion (RPE) scale;Able to understand and use Dyspnea scale;Knowledge and understanding of Target Heart Rate Range (THRR);Able to check pulse independently;Understanding of Exercise Prescription       Comments  Josyah wil graduate soon from [employer].  He improved walk test by 31.8 % and 242 feet.  he plasn to continue exercise in [employer].       Expected Outcomes  Short - complete LW Long - maintain exercise in FF          Discharge Exercise Prescription (Final Exercise Prescription Changes): Exercise Prescription Changes - 06/11/18 1100      Response to Exercise   Blood Pressure (Admit)  120/64    Blood Pressure (Exercise)  134/70    Blood Pressure (Exit)  130/80    Heart Rate (Admit)  65 bpm    Heart Rate (Exercise)  128 bpm    Heart Rate (Exit)  87 bpm    Oxygen Saturation (Admit)  91 %    Oxygen Saturation (Exercise)  92 %    Oxygen Saturation (Exit)  98 %    Rating of Perceived Exertion (Exercise)  13    Perceived Dyspnea (Exercise)  2    Symptoms  none    Duration  Continue with 45 min of aerobic exercise without signs/symptoms of physical distress.    Intensity  THRR unchanged      Progression   Progression  Continue to progress workloads to  maintain intensity without signs/symptoms of physical distress.    Average METs  2.83   walk test     Resistance Training   Training Prescription  Yes    Weight  3 lb    Reps  10-15      Interval Training   Interval Training  No      Oxygen   Oxygen  Continuous    Liters  3      REL-XR   Level  2    Speed  50    Minutes  15       Nutrition:  Target Goals: Understanding of nutrition guidelines, daily intake of sodium <1562m, cholesterol <2068m calories 30% from fat and 7% or less from saturated fats, daily to have 5 or more servings of fruits and vegetables.  Biometrics: Pre Biometrics - 03/24/18 1553      Pre Biometrics   Height  5' 9"  (1.753 m)    Weight  200 lb 3.2 oz  (90.8 kg)    Waist Circumference  42.5 inches    Hip Circumference  42 inches    Waist to Hip Ratio  1.01 %    BMI (Calculated)  29.55    Single Leg Stand  0.73 seconds      Post Biometrics - 06/11/18 1038       Post  Biometrics   Height  5' 9"  (1.753 m)    Weight  191 lb 1.6 oz (86.7 kg)    Waist Circumference  42 inches    Hip Circumference  41 inches    Waist to Hip Ratio  1.02 %    BMI (Calculated)  28.21    Single Leg Stand  7.91 seconds       Nutrition Therapy Plan and Nutrition Goals: Nutrition Therapy & Goals - 04/14/18 1128      Nutrition Therapy   Diet  DM    Drug/Food Interactions  Coumadin/Vit K    Protein (specify units)  12oz    Fiber  30 grams    Whole Grain Foods  3 servings   main source of whole grains in diet is oatmeal   Saturated Fats  15 max. grams    Fruits and Vegetables  6 servings/day   8 ideal; eats more vegetables than fruits   Sodium  1500 grams      Personal Nutrition Goals   Nutrition Goal  Work towards choosing snacks that are more nutient-dense and lower in added sugars    Personal Goal #2  Because you do not eat much meat, continue consistent daily intake of Boost nutritional drinks and try to include other dietary sources of protein such as beans, lentils, eggs, dairy foods.    Personal Goal #3  Continue to choose lower sugar/ sugar free options for items like syrup and beverages and choose foods with natural sugar instead like fruit. Great job!    Comments  He feels that his DM type II is controlled, and tries to "eat healthy." He and his wife limit fried foods, salt and sugar. Breakfast: eggs, grits, oatmeal, pancakes 1x/wk with SF syrup. Lunch: 1/2 sandwich, soup, nabs. Dinner: eats at home mostly; vegetables (Frozen not canned), beans, some meats (chicken/ roast). Beverages: diet coke, water, unsweetened tea, OJ at breakfast. Occasionally has snacks like honey buns or oatmeal cream pies @[facility] . Takes Mg, K+, Vitamin B12, and MVI daily. Takes  Remeron prn and Coumadin as prescribed. He is familiar with foods high in Vitamin K. He  would like to regain strength/muscle mass and lose 15#.       Intervention Plan   Intervention  Prescribe, educate and counsel regarding individualized specific dietary modifications aiming towards targeted core components such as weight, hypertension, lipid management, diabetes, heart failure and other comorbidities.    Expected Outcomes  Short Term Goal: Understand basic principles of dietary content, such as calories, fat, sodium, cholesterol and nutrients.;Long Term Goal: Adherence to prescribed nutrition plan.;Short Term Goal: A plan has been developed with personal nutrition goals set during dietitian appointment.       Nutrition Assessments: Nutrition Assessments - 03/26/18 1205      MEDFICTS Scores   Pre Score  60       Nutrition Goals Re-Evaluation: Nutrition Goals Re-Evaluation    [place] Name 04/14/18 1142 05/26/18 1052           Goals   Nutrition Goal  Work towards choosing snacks that are more nutrient-dense and lower in added sugars  Work towards choosing snacks that are more nutrient dense and lower in added sugar; Because you do not eat much meat continue consistent daily intake of Boost drinks and try to include other sources of dietary protein such as beans, lentils, eggs and dairy foods; Continue to choose lower sugar/ SF options for items like syrup and beverages and choose foods with natural sugar instead      Comment  He is mindful about added sugar intake in other areas of his diet but in the evenings his snacks tend to be more processed and less nutritious  He has continued to drink Boost drinks at least daily and has also been eating eggs and beans frequently. He is being mindful about high-sugar snacks, but has also found this to be challenging during the Holiday season      Expected Outcome  He will work to find more nutritious /lower sugar options for HS snacks, or reduce the  frequency that he has the full-sugar varieties  He will continue to drink Boost drinks at least daily on a consistent basis. Long term goal: eat lower sugar snacks post- Holiday and continue to choose low/zero sugar beverages         Personal Goal #2 Re-Evaluation   Personal Goal #2  Because you do not eat much meat, continue consistent daily intake of Boost nutritional drinks and try to include other dietary sources of protein such as beans, lentils, eggs, dairy foods.  -        Personal Goal #3 Re-Evaluation   Personal Goal #3  Continue to choose lower sugar/ sugar free options for items like syrup and beverages and choose foods with natural sugar instead like fruit. Great job!   -         Nutrition Goals Discharge (Final Nutrition Goals Re-Evaluation): Nutrition Goals Re-Evaluation - 05/26/18 1052      Goals   Nutrition Goal  Work towards choosing snacks that are more nutrient dense and lower in added sugar; Because you do not eat much meat continue consistent daily intake of Boost drinks and try to include other sources of dietary protein such as beans, lentils, eggs and dairy foods; Continue to choose lower sugar/ SF options for items like syrup and beverages and choose foods with natural sugar instead    Comment  He has continued to drink Boost drinks at least daily and has also been eating eggs and beans frequently. He is being mindful about high-sugar snacks, but has also found this to  be challenging during the Holiday season    Expected Outcome  He will continue to drink Boost drinks at least daily on a consistent basis. Long term goal: eat lower sugar snacks post- Holiday and continue to choose low/zero sugar beverages        Psychosocial: Target Goals: Acknowledge presence or absence of significant depression and/or stress, maximize coping skills, provide positive support system. Participant is able to verbalize types and ability to use techniques and skills needed for reducing stress  and depression.   Initial Review & Psychosocial Screening: Initial Psych Review & Screening - 03/24/18 1446      Initial Review   Current issues with  History of Depression;Current Psychotropic Meds;Current Stress Concerns;Current Sleep Concerns    Source of Stress Concerns  Chronic Illness;Unable to perform yard/household activities    Comments  Changed pysch meds since last seen, waking up frequently at night, newly diagnosis lung cancer, breathing continues to be a struggle      [place]?  Yes    Comments  Mr Doren has great support from his wife, daughter, and son.       Barriers   Psychosocial barriers to participate in program  Psychosocial barriers identified (see note);The patient should benefit from training in stress management and relaxation.      Screening Interventions   Interventions  Encouraged to exercise;Provide feedback about the scores to participant;Program counselor consult;To provide support and resources with identified psychosocial needs    Expected Outcomes  Short Term goal: Utilizing psychosocial counselor, staff and physician to assist with identification of specific Stressors or current issues interfering with healing process. Setting desired goal for each stressor or current issue identified.;Long Term Goal: Stressors or current issues are controlled or eliminated.;Short Term goal: Identification and review with participant of any Quality of Life or Depression concerns found by scoring the questionnaire.;Long Term goal: The participant improves quality of Life and PHQ9 Scores as seen by post scores and/or verbalization of changes       Quality of Life Scores:  Scores of 19 and below usually indicate a poorer quality of life in these areas.  A difference of  2-3 points is a clinically meaningful difference.  A difference of 2-3 points in the total score of the Quality of Life Index has been associated with significant improvement in  overall quality of life, self-image, physical symptoms, and general health in studies assessing change in quality of life.  PHQ-9: Recent Review Flowsheet Data    Depression screen [facility] 2/9 06/06/2018 05/26/2018 03/26/2018 03/24/2018 12/11/2017   Decreased Interest 0 1 2 1  0   Down, Depressed, Hopeless 2 1 1 1 1    PHQ - 2 Score 2 2 3 2 1    Altered sleeping 2 2 1 1  0   Tired, decreased energy 2 2 1 3 1    Change in appetite 2 1 1 1  1    Feeling bad or failure about yourself  1 1 0 0 0   Trouble concentrating 0 0 0 0 0   Moving slowly or fidgety/restless 0 1 1 0 0   Suicidal thoughts 0 0 0 0 0   PHQ-9 Score 9 9 7 7 3    Difficult doing work/chores Somewhat difficult Somewhat difficult Somewhat difficult Very difficult Not difficult at all     Interpretation of Total Score  Total Score Depression Severity:  1-4 = Minimal depression, 5-9 = Mild depression, 10-14 = Moderate depression, 15-19 =  Moderately severe depression, 20-27 = Severe depression   Psychosocial Evaluation and Intervention: Psychosocial Evaluation - 04/14/18 1127      Psychosocial Evaluation & Interventions   Interventions  Stress management education;Encouraged to exercise with the program and follow exercise prescription;Relaxation education    Comments  Counselor met with Jaziah upon returning to this program after being out with several bouts of Pneumonia recently.  He is a 68 year old who has a very strong support system with a spouse and spouse's family as well as his own family who live locally.  He reports having severe back problems; diabetes;  and left lung cancer in addition to the COPD.  He reports sleeping "okay" with ~4-5 hours most nights.  He has a good appetite and reports having minimal stress in his life other than his health.  Tan reports a history of depression and anxiety and is on medication that he reports is helpful for treating these symptoms.  His goals are to increase his stamina and strength and his  core muscles while possibly losing some weight in this program.  Staff will follow with Eddie Dibbles.      Expected Outcomes  Short:  Lional will exercise for his health and mental health to reduce his anxiety and learn ways to relax better.   Long:  Ilias will exercise consistently to increase his strength and stamina and improve his core muscles to help reduce his back pain.      Continue Psychosocial Services   Follow up required by staff       Psychosocial Re-Evaluation: Psychosocial Re-Evaluation    [place] Name 04/21/18 1445 05/26/18 1046 06/02/18 1049 06/02/18 1052 06/11/18 9563     Psychosocial Re-Evaluation   Current issues with  Current Stress Concerns;Current Depression;History of Depression  Current Stress Concerns;Current Depression;History of Depression  Current Stress Concerns  Current Stress Concerns  Current Sleep Concerns;Current Stress Concerns;Current Psychotropic Meds   Comments  Laymon states that his mood has improved since the start of the program. He sometimes throws himself a pity party but soon stops when he realizes it does not do him any good. He likes coming to exercise to get the benifit and it takes his mind off of his health. He tries really hard in the program and wants to get better,  Reviewed patient health questionnaire (PHQ-9) with patient for follow up. Previously, patients score indicated signs/symptoms of depression.  Reviewed to see if patient is improving symptom wise while in program.  Score declined and patient states that it is because he has had pneumonia and has been lacking energy.  Areon states he is deciding about what treatment to have for lung cancer.  He has good support from family whatever his decision and he says it doent call him stress.  Johngabriel states he is deciding about what treatment to have for lung cancer.  He has good support from family whatever his decision and he says it doesnt cause him stress.  Counselor follow up with Eddie Dibbles today reporting he has made  some progress in his weight loss goal and has improved his core strength.  He also has noticed increased energy.  Jacobus is continuing to have chronic pain which impact his sleep and his overall mood - which is generally positive.  Counselor commended Duante on his hard work and progres made since coming into this program.  He will continue to be followed.    Expected Outcomes  Short: Attend [name] stress management education to decrease stress.  Long: Maintain exercise Post [name] to keep stress at a minimum.  Short: Continue to work toward an improvement in [place] scores by attending [name] regularly. Long: Continue to improve stress and depression coping skills by talking with staff and attending [name] regularly and work toward a positive mental state.   Short - continue LW for the exercise Long - maintain inderpendence with ADLs  Short - continue LW for the exercise Long - maintain inderpendence with ADLs  Short:  Keyion will continue to maintain a positive attitude and exercise for his health and stress management.  He will also continue to eat healthfully for his weight loss goal.  Long:  Takuma will continue to exercise for his core; weight loss; and stress management.   Interventions  Encouraged to attend [facility] for the exercise;Relaxation education  Encouraged to attend [facility] for the exercise;Relaxation education  Encouraged to attend [facility]monary Rehabilitation for the exercise  Encouraged to attend Pulmonary Rehabilitation for the exercise  -   Continue Psychosocial Services   Follow up required by staff  Follow up required by staff  Follow up required by staff  Follow up required by staff  -   Comments  -  -  -  Changed pysch meds since last seen, waking up frequently at night, newly diagnosis lung cancer, breathing continues to be a struggle  -     Initial Review   Source of Stress Concerns  -  -  -  Chronic Illness;Unable to perform yard/household activities   -      Psychosocial Discharge (Final Psychosocial Re-Evaluation): Psychosocial Re-Evaluation - 06/11/18 0918      Psychosocial Re-Evaluation   Current issues with  Current Sleep Concerns;Current Stress Concerns;Current Psychotropic Meds    Comments  Counselor follow up with Eddie Dibbles today reporting he has made some progress in his weight loss goal and has improved his core strength.  He also has noticed increased energy.  Ramiz is continuing to have chronic pain which impact his sleep and his overall mood - which is generally positive.  Counselor commended Bashar on his hard work and progres made since coming into this program.  He will continue to be followed.     Expected Outcomes  Short:  Korrey will continue to maintain a positive attitude and exercise for his health and stress management.  He will also continue to eat healthfully for his weight loss goal.  Long:  Karla will continue to exercise for his core; weight loss; and stress management.       Education: Education Goals: Education classes will be provided on a weekly basis, covering required topics. Participant will state understanding/return demonstration of topics presented.  Learning Barriers/Preferences: Learning Barriers/Preferences - 03/24/18 1448      Learning Barriers/Preferences   Learning Barriers  Sight;Hearing   glasses and hearing aids   Learning Preferences  None       Education Topics:  Initial Evaluation Education: - Verbal, written and demonstration of respiratory meds, oximetry and breathing techniques. Instruction on use of nebulizers and MDIs and importance of monitoring MDI activations.   Pulmonary Rehab from 06/18/2018 in [facility] Cardiac and Pulmonary Rehab  Date  03/24/18  Educator  [facility]  Instruction Review Code  1- Verbalizes Understanding      General Nutrition Guidelines/Fats and Fiber: -Group instruction provided by verbal, written material, models and posters to present the general guidelines for heart  healthy nutrition. Gives an explanation and review of dietary fats and fiber.  Pulmonary Rehab from 10/23/2017 in [facility] Cardiac and Pulmonary Rehab  Date  10/14/17  Educator  CR  Instruction Review Code  1- Verbalizes Understanding      Controlling Sodium/Reading Food Labels: -Group verbal and written material supporting the discussion of sodium use in heart healthy nutrition. Review and explanation with models, verbal and written materials for utilization of the food label.   Pulmonary Rehab from 06/18/2018 in [facility] Cardiac and Pulmonary Rehab  Date  04/16/18  Educator  LB  Instruction Review Code  1- Verbalizes Understanding      Exercise Physiology & General Exercise Guidelines: - Group verbal and written instruction with models to review the exercise physiology of the cardiovascular system and associated critical values. Provides general exercise guidelines with specific guidelines to those with heart or lung disease.    Pulmonary Rehab from 06/18/2018 in [facility] Cardiac and Pulmonary Rehab  Date  06/18/18  Educator  [facility]  Instruction Review Code  5- Refused Teaching      Aerobic Exercise & Resistance Training: - Gives group verbal and written instruction on the various components of exercise. Focuses on aerobic and resistive training programs and the benefits of this training and how to safely progress through these programs.   Pulmonary Rehab from 06/18/2018 in [facility] Cardiac and Pulmonary Rehab  Date  05/23/18  Educator  [facility]  Instruction Review Code  1- Verbalizes Understanding      Flexibility, Balance, Mind/Body Relaxation: Provides group verbal/written instruction on the benefits of flexibility and balance training, including mind/body exercise modes such as yoga, pilates and tai chi.  Demonstration and skill practice provided.   Cardiac Rehab from 03/21/2016 in [facility] Cardiac and Pulmonary Rehab  Date  01/09/16  Educator  [facility]  Instruction Review Code (retired)  2- meets  goals/outcomes      Stress and Anxiety: - Provides group verbal and written instruction about the health risks of elevated stress and causes of high stress.  Discuss the correlation between heart/lung disease and anxiety and treatment options. Review healthy ways to manage with stress and anxiety.   Pulmonary Rehab from 10/23/2017 in [facility] Cardiac and Pulmonary Rehab  Date  10/23/17  Educator  [facility]  Instruction Review Code  1- Verbalizes Understanding      Depression: - Provides group verbal and written instruction on the correlation between heart/lung disease and depressed mood, treatment options, and the stigmas associated with seeking treatment.   Exercise & Equipment Safety: - Individual verbal instruction and demonstration of equipment use and safety with use of the equipment.   Pulmonary Rehab from 06/18/2018 in [facility] Cardiac and Pulmonary Rehab  Date  03/24/18  Educator  [facility]  Instruction Review Code  1- Verbalizes Understanding      Infection Prevention: - Provides verbal and written material to individual with discussion of infection control including proper hand washing and proper equipment cleaning during exercise session.   Pulmonary Rehab from 06/18/2018 in [facility] Cardiac and Pulmonary Rehab  Date  03/24/18  Educator  [facility]  Instruction Review Code  1- Verbalizes Understanding      Falls Prevention: - Provides verbal and written material to individual with discussion of falls prevention and safety.   Pulmonary Rehab from 06/18/2018 in [facility] Cardiac and Pulmonary Rehab  Date  03/24/18  Educator  [facility]  Instruction Review Code  1- Verbalizes Understanding      Diabetes: - Individual verbal and written instruction to review signs/symptoms of diabetes, desired ranges of glucose level fasting, after meals  and with exercise. Advice that pre and post exercise glucose checks will be done for 3 sessions at entry of program.   Cardiac Rehab from 03/21/2016 in [facility] Cardiac and Pulmonary  Rehab  Date  01/02/16  Educator  D. Joya Gaskins, RN  Instruction Review Code (retired)  2- meets goals/outcomes      Chronic Lung Diseases: - Group verbal and written instruction to review updates, respiratory medications, advancements in procedures and treatments. Discuss use of supplemental oxygen including available portable oxygen systems, continuous and intermittent flow rates, concentrators, personal use and safety guidelines. Review proper use of inhaler and spacers. Provide informative websites for self-education.    Pulmonary Rehab from 06/18/2018 in [facility] Cardiac and Pulmonary Rehab  Date  04/30/18  Educator  [facility]  Instruction Review Code  1- Verbalizes Understanding      Energy Conservation: - Provide group verbal and written instruction for methods to conserve energy, plan and organize activities. Instruct on pacing techniques, use of adaptive equipment and posture/positioning to relieve shortness of breath.   Pulmonary Rehab from 10/23/2017 in [facility] Cardiac and Pulmonary Rehab  Date  10/16/17  Educator  [facility]  Instruction Review Code  1- Verbalizes Understanding      Triggers and Exacerbations: - Group verbal and written instruction to review types of environmental triggers and ways to prevent exacerbations. Discuss weather changes, air quality and the benefits of nasal washing. Review warning signs and symptoms to help prevent infections. Discuss techniques for effective airway clearance, coughing, and vibrations.   AED/CPR: - Group verbal and written instruction with the use of models to demonstrate the basic use of the AED with the basic ABC's of resuscitation.   Pulmonary Rehab from 06/18/2018 in [facility] Cardiac and Pulmonary Rehab  Date  06/11/18  Educator  [facility]  Instruction Review Code  1- Actuary and Physiology of the Lungs: - Group verbal and written instruction with the use of models to provide basic lung anatomy and physiology related to  function, structure and complications of lung disease.   Anatomy & Physiology of the Heart: - Group verbal and written instruction and models provide basic cardiac anatomy and physiology, with the coronary electrical and arterial systems. Review of Valvular disease and Heart Failure   Pulmonary Rehab from 06/18/2018 in [facility] Cardiac and Pulmonary Rehab  Date  05/30/18  Educator  [facility]  Instruction Review Code  1- Verbalizes Understanding      Cardiac Medications: - Group verbal and written instruction to review commonly prescribed medications for heart disease. Reviews the medication, class of the drug, and side effects.   Pulmonary Rehab from 06/18/2018 in [facility] Cardiac and Pulmonary Rehab  Date  06/06/18  Educator  [facility]  Instruction Review Code  5- Refused Teaching      Know Your Numbers and Risk Factors: -Group verbal and written instruction about important numbers in your health.  Discussion of what are risk factors and how they play a role in the disease process.  Review of Cholesterol, Blood Pressure, Diabetes, and BMI and the role they play in your overall health.   Sleep Hygiene: -Provides group verbal and written instruction about how sleep can affect your health.  Define sleep hygiene, discuss sleep cycles and impact of sleep habits. Review good sleep hygiene tips.    Pulmonary Rehab from 06/18/2018 in [facility] Cardiac and Pulmonary Rehab  Date  05/07/18  Educator  [facility]  Instruction Review Code  1- Verbalizes Understanding  Other: -Provides group and verbal instruction on various topics (see comments)    Knowledge Questionnaire Score: Knowledge Questionnaire Score - 06/11/18 1154      Knowledge Questionnaire Score   Pre Score  15/18    Post Score  13/18   reviewed with patient       Core Components/Risk Factors/Patient Goals at Admission: Personal Goals and Risk Factors at Admission - 03/24/18 1448      Core Components/Risk Factors/Patient Goals on Admission     Weight Management  Yes;Weight Maintenance;Weight Loss    Intervention  Weight Management: Develop a combined nutrition and exercise program designed to reach desired caloric intake, while maintaining appropriate intake of nutrient and fiber, sodium and fats, and appropriate energy expenditure required for the weight goal.;Weight Management: Provide education and appropriate resources to help participant work on and attain dietary goals.;Weight Management/Obesity: Establish reasonable short term and long term weight goals.;Obesity: Provide education and appropriate resources to help participant work on and attain dietary goals.    Admit Weight  200 lb 3.2 oz (90.8 kg)    Goal Weight: Short Term  195 lb (88.5 kg)    Goal Weight: Long Term  185 lb (83.9 kg)    Expected Outcomes  Short Term: Continue to assess and modify interventions until short term weight is achieved;Long Term: Adherence to nutrition and physical activity/exercise program aimed toward attainment of established weight goal;Weight Loss: Understanding of general recommendations for a balanced deficit meal plan, which promotes 1-2 lb weight loss per week and includes a negative energy balance of [phone] kcal/d;Understanding recommendations for meals to include 15-35% energy as protein, 25-35% energy from fat, 35-60% energy from carbohydrates, less than 276m of dietary cholesterol, 20-35 gm of total fiber daily;Understanding of distribution of calorie intake throughout the day with the consumption of 4-5 meals/snacks    Improve shortness of breath with ADL's  Yes    Intervention  Provide education, individualized exercise plan and daily activity instruction to help decrease symptoms of SOB with activities of daily living.    Expected Outcomes  Short Term: Improve cardiorespiratory fitness to achieve a reduction of symptoms when performing ADLs;Long Term: Be able to perform more ADLs without symptoms or delay the onset of symptoms    Heart  Failure  Yes    Intervention  Provide a combined exercise and nutrition program that is supplemented with education, support and counseling about heart failure. Directed toward relieving symptoms such as shortness of breath, decreased exercise tolerance, and extremity edema.    Expected Outcomes  Improve functional capacity of life;Short term: Attendance in program 2-3 days a week with increased exercise capacity. Reported lower sodium intake. Reported increased fruit and vegetable intake. Reports medication compliance.;Short term: Daily weights obtained and reported for increase. Utilizing diuretic protocols set by physician.;Long term: Adoption of self-care skills and reduction of barriers for early signs and symptoms recognition and intervention leading to self-care maintenance.    Hypertension  Yes    Intervention  Provide education on lifestyle modifcations including regular physical activity/exercise, weight management, moderate sodium restriction and increased consumption of fresh fruit, vegetables, and low fat dairy, alcohol moderation, and smoking cessation.;Monitor prescription use compliance.    Expected Outcomes  Short Term: Continued assessment and intervention until BP is < 140/912mHG in hypertensive participants. < 130/8023mG in hypertensive participants with diabetes, heart failure or chronic kidney disease.;Long Term: Maintenance of blood pressure at goal levels.    Lipids  Yes    Intervention  Provide  education and support for participant on nutrition & aerobic/resistive exercise along with prescribed medications to achieve LDL <29m, HDL >479m    Expected Outcomes  Short Term: Participant states understanding of desired cholesterol values and is compliant with medications prescribed. Participant is following exercise prescription and nutrition guidelines.;Long Term: Cholesterol controlled with medications as prescribed, with individualized exercise RX and with personalized nutrition  plan. Value goals: LDL < 7045mHDL > 40 mg.       Core Components/Risk Factors/Patient Goals Review:  Goals and Risk Factor Review    Row Name 04/21/18 1449 06/02/18 1041           Core Components/Risk Factors/Patient Goals Review   Personal Goals Review  Hypertension;Diabetes;Improve shortness of breath with ADL's;Weight Management/Obesity  Hypertension;Diabetes;Improve shortness of breath with ADL's;Other      Review  PauChivasnds it hard to do things at home due to his back pain. He has recently had shots in his back to help. He says that he may get a procedure to burn the nerves in his back so it can reduce his pain. His blood pressure has been good. He has not been hypertensive. He is checking his blood sugar at home and continues to do so.   PauRenleys been better with ADL's since back injections.  He is able to get around to do more at home.  He is checking BP and BG at home.  He also takes all meds as directed.  He can walk up side steps - 5 - without stopping.      Expected Outcomes  Short: Attend [name] regularly to improve shortness of breath with ADL's. Long: maintain independence with ADL's   Short - continue to attend LW regularly Long - maintain independence with ADL's          Core Components/Risk Factors/Patient Goals at Discharge (Final Review):  Goals and Risk Factor Review - 06/02/18 1041      Core Components/Risk Factors/Patient Goals Review   Personal Goals Review  Hypertension;Diabetes;Improve shortness of breath with ADL's;Other    Review  PauJahs been better with ADL's since back injections.  He is able to get around to do more at home.  He is checking BP and BG at home.  He also takes all meds as directed.  He can walk up side steps - 5 - without stopping.    Expected Outcomes  Short - continue to attend LW regularly Long - maintain independence with ADL's        ITP Comments: ITP Comments    Row Name 03/24/18 1534 04/14/18 0826 05/12/18 0820 06/09/18 0824       ITP Comments  Medical evaluation completed today.  Pt started on visit 15 today.  New ITP created and sent for review to Dr. MarEmily Filbertedical Director.  Documentation for diagnosis from [facility] encounter 03/18/18 and 09/16/17.  30 day review completed. ITP sent to Dr. MarEmily Filbertrector of [place] with ITP unless changes are made by physician.  30 day review completed. ITP sent to Dr. MarEmily Filbertrector of [place] with ITP unless changes are made by physician.  30 day review completed. ITP sent to Dr. MarEmily Filbertrector of [place] with ITP unless changes are made by physician.       Comments: Discharge ITP sent and signed by Dr. MilSabra HeckDischarge Summary routed to PCP and cardiologist.

## 2018-06-27 NOTE — Progress Notes (Signed)
**Note William-Identified via Obfuscation** Discharge Progress Report  Patient Details  Name: William Bray MRN: [id number] Date of Birth: 03/06/1951 Referring Provider:     Pulmonary Rehab from 03/24/2018 in [facility]  Referring Provider  Kelle Darting MD       Number of Visits: 36  Reason for Discharge:  Patient reached a stable level of exercise. Patient independent in their exercise. Patient has met program and personal goals.  Smoking History:  Social History   Tobacco Use  Smoking Status Former Smoker  . Packs/day: 1.00  . Years: 45.00  . Pack years: 45.00  . Types: Cigarettes  . Last attempt to quit: 05/28/2005  . 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) ([place])  Heart failure, chronic systolic ([place])  ADL [facility]: Pulmonary Assessment Scores    Row Name 03/24/18 1530 03/26/18 1202 06/06/18 1130     ADL [facility]   ADL Phase  Entry  Entry  Exit   SOB Score total  -  51  47   Rest  -  0  0   Walk  -  1  1   Stairs  -  5  4   Bath  -  1  1   Dress  -  1  1   Shop  -  1  3     CAT Score   CAT Score  -  19  19     mMRC Score   mMRC Score  3  3  -   Row Name 06/11/18 1038         ADL [facility]   ADL Phase  Exit     SOB Score total  47     Rest  0     Walk  1     Stairs  4     Bath  1     Dress  1     Shop  3       CAT Score   CAT Score  19       mMRC Score   mMRC Score  1        Initial Exercise Prescription: Initial Exercise Prescription - 03/24/18 1500      Date of Initial Exercise RX and Referring Provider   Date  03/24/18    Referring Provider  Kelle Darting MD      Oxygen   Oxygen  Continuous    Liters  3      Treadmill   MPH  1.8    Grade  0    Minutes  15    METs  2.38      REL-XR   Level  1    Speed  50    Minutes  15    METs  2      T5 Nustep   Level  1    SPM  80    Minutes  15    METs  2       Prescription Details   Frequency (times per week)  3    Duration  Progress to 45 minutes of aerobic exercise without signs/symptoms of physical distress      Intensity   THRR 40-80% of Max Heartrate  116-141    Ratings of Perceived Exertion  11-13    Perceived Dyspnea  0-4  Progression   Progression  Continue to progress workloads to maintain intensity without signs/symptoms of physical distress.      Resistance Training   Training Prescription  Yes    Weight  3 lbs    Reps  10-15       Discharge Exercise Prescription (Final Exercise Prescription Changes): Exercise Prescription Changes - 06/11/18 1100      Response to Exercise   Blood Pressure (Admit)  120/64    Blood Pressure (Exercise)  134/70    Blood Pressure (Exit)  130/80    Heart Rate (Admit)  65 bpm    Heart Rate (Exercise)  128 bpm    Heart Rate (Exit)  87 bpm    Oxygen Saturation (Admit)  91 %    Oxygen Saturation (Exercise)  92 %    Oxygen Saturation (Exit)  98 %    Rating of Perceived Exertion (Exercise)  13    Perceived Dyspnea (Exercise)  2    Symptoms  none    Duration  Continue with 45 min of aerobic exercise without signs/symptoms of physical distress.    Intensity  THRR unchanged      Progression   Progression  Continue to progress workloads to maintain intensity without signs/symptoms of physical distress.    Average METs  2.83   walk test     Resistance Training   Training Prescription  Yes    Weight  3 lb    Reps  10-15      Interval Training   Interval Training  No      Oxygen   Oxygen  Continuous    Liters  3      REL-XR   Level  2    Speed  50    Minutes  15       Functional Capacity: [place] Name 03/24/18 1544 06/11/18 1035       6 Minute Walk   Phase  Initial  Discharge    Distance  760 feet  1002 feet    Distance % Change  -  31.8 %    Distance Feet Change  -  242 ft    Walk Time  4.6 minutes  6 minutes    # of Rest Breaks  3 15 sec, 9 sec, 1  min  0    MPH  1.88  1.9    METS  2.39  2.83    RPE  13  13    Perceived Dyspnea   2  2    VO2 Peak  8.36  9.91    Symptoms  Yes (comment)  Yes (comment)    Comments  SOB, back and leg pain 8/10  SOB, calves burning 3/10    Resting HR  92 bpm  89 bpm    Resting BP  132/64  120/64    Resting Oxygen Saturation   95 %  99 %    Exercise Oxygen Saturation  during 6 min walk  86 %  92 %    Max Ex. HR  132 bpm  128 bpm    Max Ex. BP  136/64  134/70    2 Minute Post BP  124/62  134/70      Interval HR   1 Minute HR  83  128    2 Minute HR  81  124    3 Minute HR  94  109    4 Minute HR  98  114    5 Minute HR  117  112    6 Minute HR  132  112    2 Minute Post HR  108  105    Interval Heart Rate?  Yes  Yes      Interval Oxygen   Interval Oxygen?  Yes  Yes    Baseline Oxygen Saturation %  95 %  99 %    1 Minute Oxygen Saturation %  92 %  95 %    1 Minute Liters of Oxygen  3 L  4 L    2 Minute Oxygen Saturation %  88 % rest break 2:20-2:35  94 %    2 Minute Liters of Oxygen  3 L  4 L    3 Minute Oxygen Saturation %  89 % rest break 3:14-3:23  94 %    3 Minute Liters of Oxygen  3 L  4 L    4 Minute Oxygen Saturation %  90 % rest break 4:13-5:13  94 %    4 Minute Liters of Oxygen  3 L  4 L    5 Minute Oxygen Saturation %  86 %  94 %    5 Minute Liters of Oxygen  3 L  4 L    6 Minute Oxygen Saturation %  88 %  92 %    6 Minute Liters of Oxygen  3 L  4 L    2 Minute Post Oxygen Saturation %  90 %  94 %    2 Minute Post Liters of Oxygen  3 L  4 L       Psychological, QOL, Others - Outcomes: PHQ 2/9: Depression screen [facility] 2/9 06/06/2018 05/26/2018 03/26/2018 03/24/2018 12/11/2017  Decreased Interest 0 _0 0  Down, Depressed, Hopeless _1 PHQ - 2 Score _2 Altered sleeping _3 0  Tired, decreased energy _4 Change in appetite _5 Feeling bad or failure about yourself  1 1 0 0 0  Trouble concentrating 0 0 0 0 0  Moving slowly or fidgety/restless  0 1 1 0 0  Suicidal thoughts 0 0 0 0 0  PHQ-9 Score _6 Difficult doing work/chores Somewhat difficult Somewhat difficult Somewhat difficult Very difficult Not difficult at all  Some recent data might be hidden    Quality of Life:   Personal Goals: Goals established at orientation with interventions provided to work toward goal. Personal Goals and Risk Factors at Admission - 03/24/18 1448      Core Components/Risk Factors/Patient Goals on Admission    Weight Management  Yes;Weight Maintenance;Weight Loss    Intervention  Weight Management: Develop a combined nutrition and exercise program designed to reach desired caloric intake, while maintaining appropriate intake of nutrient and fiber, sodium and fats, and appropriate energy expenditure required for the weight goal.;Weight Management: Provide education and appropriate resources to help participant work on and attain dietary goals.;Weight Management/Obesity: Establish reasonable short term and long term weight goals.;Obesity: Provide education and appropriate resources to help participant work on and attain dietary goals.    Admit Weight  200 lb 3.2 oz (90.8 kg)    Goal Weight: Short Term  195 lb (88.5 kg)    Goal Weight: Long Term  185 lb (83.9 kg)    Expected Outcomes  Short Term: Continue to assess  and modify interventions until short term weight is achieved;Long Term: Adherence to nutrition and physical activity/exercise program aimed toward attainment of established weight goal;Weight Loss: Understanding of general recommendations for a balanced deficit meal plan, which promotes 1-2 lb weight loss per week and includes a negative energy balance of [phone] kcal/d;Understanding recommendations for meals to include 15-35% energy as protein, 25-35% energy from fat, 35-60% energy from carbohydrates, less than 235m of dietary cholesterol, 20-35 gm of total fiber daily;Understanding of distribution of calorie intake throughout the day  with the consumption of 4-5 meals/snacks    Improve shortness of breath with ADL's  Yes    Intervention  Provide education, individualized exercise plan and daily activity instruction to help decrease symptoms of SOB with activities of daily living.    Expected Outcomes  Short Term: Improve cardiorespiratory fitness to achieve a reduction of symptoms when performing ADLs;Long Term: Be able to perform more ADLs without symptoms or delay the onset of symptoms    Heart Failure  Yes    Intervention  Provide a combined exercise and nutrition program that is supplemented with education, support and counseling about heart failure. Directed toward relieving symptoms such as shortness of breath, decreased exercise tolerance, and extremity edema.    Expected Outcomes  Improve functional capacity of life;Short term: Attendance in program 2-3 days a week with increased exercise capacity. Reported lower sodium intake. Reported increased fruit and vegetable intake. Reports medication compliance.;Short term: Daily weights obtained and reported for increase. Utilizing diuretic protocols set by physician.;Long term: Adoption of self-care skills and reduction of barriers for early signs and symptoms recognition and intervention leading to self-care maintenance.    Hypertension  Yes    Intervention  Provide education on lifestyle modifcations including regular physical activity/exercise, weight management, moderate sodium restriction and increased consumption of fresh fruit, vegetables, and low fat dairy, alcohol moderation, and smoking cessation.;Monitor prescription use compliance.    Expected Outcomes  Short Term: Continued assessment and intervention until BP is < 140/921mHG in hypertensive participants. < 130/8069mG in hypertensive participants with diabetes, heart failure or chronic kidney disease.;Long Term: Maintenance of blood pressure at goal levels.    Lipids  Yes    Intervention  Provide education and support  for participant on nutrition & aerobic/resistive exercise along with prescribed medications to achieve LDL <43m52mDL >40mg14m Expected Outcomes  Short Term: Participant states understanding of desired cholesterol values and is compliant with medications prescribed. Participant is following exercise prescription and nutrition guidelines.;Long Term: Cholesterol controlled with medications as prescribed, with individualized exercise RX and with personalized nutrition plan. Value goals: LDL < 43mg,41m > 40 mg.        Personal Goals Discharge: Goals and Risk Factor Review    Row Name 04/21/18 1449 06/02/18 1041           Core Components/Risk Factors/Patient Goals Review   Personal Goals Review  Hypertension;Diabetes;Improve shortness of breath with ADL's;Weight Management/Obesity  Hypertension;Diabetes;Improve shortness of breath with ADL's;Other      Review  William Bray hard to do things at home due to his back pain. He has recently had shots in his back to help. He says that he may get a procedure to burn the nerves in his back so Bray can reduce his pain. His blood pressure has been good. He has not been hypertensive. He is checking his blood sugar at home and continues to do so.   William Bray better with ADL's  since back injections.  He is able to get around to do more at home.  He is checking BP and BG at home.  He also takes all meds as directed.  He can walk up side steps - 5 - without stopping.      Expected Outcomes  Short: Attend [name] regularly to improve shortness of breath with ADL's. Long: maintain independence with ADL's   Short - continue to attend LW regularly Long - maintain independence with ADL's          Exercise Goals and Review: Exercise Goals    Row Name 03/24/18 1552             Exercise Goals   Increase Physical Activity  Yes       Intervention  Provide advice, education, support and counseling about physical activity/exercise needs.;Develop an  individualized exercise prescription for aerobic and resistive training based on initial evaluation findings, risk stratification, comorbidities and participant's personal goals.       Expected Outcomes  Short Term: Attend rehab on a regular basis to increase amount of physical activity.;Long Term: Add in home exercise to make exercise part of routine and to increase amount of physical activity.;Long Term: Exercising regularly at least 3-5 days a week.       Increase Strength and Stamina  Yes       Intervention  Provide advice, education, support and counseling about physical activity/exercise needs.;Develop an individualized exercise prescription for aerobic and resistive training based on initial evaluation findings, risk stratification, comorbidities and participant's personal goals.       Expected Outcomes  Short Term: Increase workloads from initial exercise prescription for resistance, speed, and METs.;Short Term: Perform resistance training exercises routinely during rehab and add in resistance training at home;Long Term: Improve cardiorespiratory fitness, muscular endurance and strength as measured by increased METs and functional capacity (6MWT)       Able to understand and use rate of perceived exertion (RPE) scale  Yes       Intervention  Provide education and explanation on how to use RPE scale       Expected Outcomes  Long Term:  Able to use RPE to guide intensity level when exercising independently;Short Term: Able to use RPE daily in rehab to express subjective intensity level       Able to understand and use Dyspnea scale  Yes       Intervention  Provide education and explanation on how to use Dyspnea scale       Expected Outcomes  Long Term: Able to use Dyspnea scale to guide intensity level when exercising independently;Short Term: Able to use Dyspnea scale daily in rehab to express subjective sense of shortness of breath during exertion       Knowledge and understanding of Target Heart  Rate Range (THRR)  Yes       Intervention  Provide education and explanation of THRR including how the numbers were predicted and where they are located for reference       Expected Outcomes  Short Term: Able to state/look up THRR;Short Term: Able to use daily as guideline for intensity in rehab;Long Term: Able to use THRR to govern intensity when exercising independently       Able to check pulse independently  Yes       Intervention  Provide education and demonstration on how to check pulse in carotid and radial arteries.;Review the importance of being able to check your own pulse for safety during  independent exercise       Expected Outcomes  Short Term: Able to explain why pulse checking is important during independent exercise;Long Term: Able to check pulse independently and accurately       Understanding of Exercise Prescription  Yes       Intervention  Provide education, explanation, and written materials on patient's individual exercise prescription       Expected Outcomes  Short Term: Able to explain program exercise prescription;Long Term: Able to explain home exercise prescription to exercise independently          Exercise Goals Re-Evaluation: Exercise Goals Re-Evaluation    [place] Name 03/26/18 1058 04/16/18 1201 04/30/18 1229 05/14/18 1253 05/27/18 1241     Exercise Goal Re-Evaluation   Exercise Goals Review  Increase Physical Activity;Able to understand and use rate of perceived exertion (RPE) scale;Knowledge and understanding of Target Heart Rate Range (THRR);Understanding of Exercise Prescription;Increase Strength and Stamina;Able to understand and use Dyspnea scale  Increase Physical Activity;Increase Strength and Stamina;Able to understand and use rate of perceived exertion (RPE) scale;Able to understand and use Dyspnea scale;Knowledge and understanding of Target Heart Rate Range (THRR);Understanding of Exercise Prescription  Increase Physical Activity;Increase Strength and  Stamina;Able to understand and use rate of perceived exertion (RPE) scale;Able to understand and use Dyspnea scale;Knowledge and understanding of Target Heart Rate Range (THRR);Understanding of Exercise Prescription  Increase Physical Activity;Able to understand and use rate of perceived exertion (RPE) scale;Knowledge and understanding of Target Heart Rate Range (THRR);Understanding of Exercise Prescription;Increase Strength and Stamina;Able to understand and use Dyspnea scale;Able to check pulse independently  Increase Physical Activity;Increase Strength and Stamina;Able to understand and use rate of perceived exertion (RPE) scale;Knowledge and understanding of Target Heart Rate Range (THRR);Understanding of Exercise Prescription   Comments  Reviewed RPE scale, THR and program prescription with pt today.  Pt voiced understanding and was given a copy of goals to take home.   Paukis having a procedure for his back tomorrow that he hopes will help with back pain.  He states he wants to work as much as he can and back pain inhibits his improvement.  William Bray stated he can feel the difference this week with exercise.  He had injections for back pain that are helping him.   Degan continues to feel better and more able to exercise due to his back injections. Continue to monitor pain and progress as his pain will allow.  William Bray has been feeling better since getting injections for back pain.  This should help him attend more regularly and see better progress.   Expected Outcomes  Short: Use RPE daily to regulate intensity. Long: Follow program prescription in THR.  Short - get clearance afte rprocedure to return to exercise Long - maintain or improve MET level  Short - attend consistently Long - increase endurance and MET level  Short- attend class regularly, increase levels as pain allows. Long- increase MET level and graduate from program.  Short - attend LW 3 times per week Long - maintain or increase MET level   Row Name  06/11/18 1126             Exercise Goal Re-Evaluation   Exercise Goals Review  Increase Physical Activity;Increase Strength and Stamina;Able to understand and use rate of perceived exertion (RPE) scale;Able to understand and use Dyspnea scale;Knowledge and understanding of Target Heart Rate Range (THRR);Able to check pulse independently;Understanding of Exercise Prescription       Comments  William Bray wil graduate soon from  [employer].  He improved walk test by 31.8 % and 242 feet.  he plasn to continue exercise in [employer].       Expected Outcomes  Short - complete LW Long - maintain exercise in FF          Nutrition & Weight - Outcomes: Pre Biometrics - 03/24/18 1553      Pre Biometrics   Height  _0  (1.753 m)    Weight  200 lb 3.2 oz (90.8 kg)    Waist Circumference  42.5 inches    Hip Circumference  42 inches    Waist to Hip Ratio  1.01 %    BMI (Calculated)  29.55    Single Leg Stand  0.73 seconds      Post Biometrics - 06/11/18 1038       Post  Biometrics   Height  _1  (1.753 m)    Weight  191 lb 1.6 oz (86.7 kg)    Waist Circumference  42 inches    Hip Circumference  41 inches    Waist to Hip Ratio  1.02 %    BMI (Calculated)  28.21    Single Leg Stand  7.91 seconds       Nutrition: Nutrition Therapy & Goals - 04/14/18 1128      Nutrition Therapy   Diet  DM    Drug/Food Interactions  Coumadin/Vit K    Protein (specify units)  12oz    Fiber  30 grams    Whole Grain Foods  3 servings   main source of whole grains in diet is oatmeal   Saturated Fats  15 max. grams    Fruits and Vegetables  6 servings/day   8 ideal; eats more vegetables than fruits   Sodium  1500 grams      Personal Nutrition Goals   Nutrition Goal  Work towards choosing snacks that are more nutient-dense and lower in added sugars    Personal Goal #2  Because you do not eat much meat, continue consistent daily intake of Boost nutritional drinks and try to include other dietary sources of  protein such as beans, lentils, eggs, dairy foods.    Personal Goal #3  Continue to choose lower sugar/ sugar free options for items like syrup and beverages and choose foods with natural sugar instead like fruit. Great job!    Comments  He feels that his DM type II is controlled, and tries to "eat healthy." He and his wife limit fried foods, salt and sugar. Breakfast: eggs, grits, oatmeal, pancakes 1x/wk with SF syrup. Lunch: 1/2 sandwich, soup, nabs. Dinner: eats at home mostly; vegetables (Frozen not canned), beans, some meats (chicken/ roast). Beverages: diet coke, water, unsweetened tea, OJ at breakfast. Occasionally has snacks like honey buns or oatmeal cream pies _2 . Takes Mg, K+, Vitamin B12, and MVI daily. Takes Remeron prn and Coumadin as prescribed. He is familiar with foods high in Vitamin K. He would like to regain strength/muscle mass and lose 15#.       Intervention Plan   Intervention  Prescribe, educate and counsel regarding individualized specific dietary modifications aiming towards targeted core components such as weight, hypertension, lipid management, diabetes, heart failure and other comorbidities.    Expected Outcomes  Short Term Goal: Understand basic principles of dietary content, such as calories, fat, sodium, cholesterol and nutrients.;Long Term Goal: Adherence to prescribed nutrition plan.;Short Term Goal: A plan has been developed with personal nutrition goals set during dietitian appointment.  Nutrition Discharge: Nutrition Assessments - 03/26/18 1205      MEDFICTS Scores   Pre Score  60       Education Questionnaire Score: Knowledge Questionnaire Score - 06/11/18 1154      Knowledge Questionnaire Score   Pre Score  15/18    Post Score  13/18   reviewed with patient      Goals reviewed with patient; copy given to patient.

## 2019-02-26 DEATH — deceased
